# Patient Record
Sex: Female | Born: 1935
Health system: Southern US, Community
[De-identification: ages and names within clinical notes are randomized; demographics above are authoritative.]

## PROBLEM LIST (undated history)

## (undated) DIAGNOSIS — J309 Allergic rhinitis, unspecified: Secondary | ICD-10-CM

## (undated) DIAGNOSIS — R896 Abnormal cytological findings in specimens from other organs, systems and tissues: Secondary | ICD-10-CM

## (undated) DIAGNOSIS — E7800 Pure hypercholesterolemia, unspecified: Secondary | ICD-10-CM

## (undated) DIAGNOSIS — F329 Major depressive disorder, single episode, unspecified: Secondary | ICD-10-CM

## (undated) DIAGNOSIS — IMO0001 Reserved for inherently not codable concepts without codable children: Secondary | ICD-10-CM

## (undated) DIAGNOSIS — F32A Depression, unspecified: Secondary | ICD-10-CM

## (undated) DIAGNOSIS — E049 Nontoxic goiter, unspecified: Secondary | ICD-10-CM

## (undated) DIAGNOSIS — M8580 Other specified disorders of bone density and structure, unspecified site: Secondary | ICD-10-CM

## (undated) DIAGNOSIS — Z87442 Personal history of urinary calculi: Secondary | ICD-10-CM

## (undated) DIAGNOSIS — IMO0002 Reserved for concepts with insufficient information to code with codable children: Secondary | ICD-10-CM

## (undated) DIAGNOSIS — M47819 Spondylosis without myelopathy or radiculopathy, site unspecified: Secondary | ICD-10-CM

## (undated) DIAGNOSIS — I10 Essential (primary) hypertension: Secondary | ICD-10-CM

## (undated) DIAGNOSIS — F5104 Psychophysiologic insomnia: Secondary | ICD-10-CM

## (undated) DIAGNOSIS — K219 Gastro-esophageal reflux disease without esophagitis: Secondary | ICD-10-CM

## (undated) DIAGNOSIS — N83209 Unspecified ovarian cyst, unspecified side: Secondary | ICD-10-CM

## (undated) DIAGNOSIS — K859 Acute pancreatitis without necrosis or infection, unspecified: Secondary | ICD-10-CM

## (undated) DIAGNOSIS — M419 Scoliosis, unspecified: Secondary | ICD-10-CM

## (undated) DIAGNOSIS — N952 Postmenopausal atrophic vaginitis: Secondary | ICD-10-CM

## (undated) HISTORY — DX: Essential (primary) hypertension: I10

## (undated) HISTORY — DX: Postmenopausal atrophic vaginitis: N95.2

## (undated) HISTORY — DX: Unspecified ovarian cyst, unspecified side: N83.209

## (undated) HISTORY — PX: TONSILLECTOMY: SUR1361

## (undated) HISTORY — DX: Gastro-esophageal reflux disease without esophagitis: K21.9

## (undated) HISTORY — PX: WISDOM TOOTH EXTRACTION: SHX21

## (undated) HISTORY — DX: Reserved for concepts with insufficient information to code with codable children: IMO0002

## (undated) HISTORY — DX: Reserved for inherently not codable concepts without codable children: IMO0001

## (undated) HISTORY — DX: Abnormal cytological findings in specimens from other organs, systems and tissues: R89.6

## (undated) HISTORY — DX: Psychophysiologic insomnia: F51.04

## (undated) HISTORY — DX: Other specified disorders of bone density and structure, unspecified site: M85.80

## (undated) HISTORY — DX: Spondylosis without myelopathy or radiculopathy, site unspecified: M47.819

## (undated) HISTORY — DX: Scoliosis, unspecified: M41.9

---

## 1975-07-04 HISTORY — PX: ABDOMINAL HYSTERECTOMY: SHX81

## 1989-03-03 HISTORY — PX: KIDNEY STONE SURGERY: SHX686

## 1998-01-13 ENCOUNTER — Other Ambulatory Visit: Admission: RE | Admit: 1998-01-13 | Discharge: 1998-01-13 | Payer: Self-pay | Admitting: Obstetrics and Gynecology

## 1998-05-24 ENCOUNTER — Ambulatory Visit (HOSPITAL_COMMUNITY): Admission: RE | Admit: 1998-05-24 | Discharge: 1998-05-24 | Payer: Self-pay | Admitting: *Deleted

## 1999-01-25 ENCOUNTER — Other Ambulatory Visit: Admission: RE | Admit: 1999-01-25 | Discharge: 1999-01-25 | Payer: Self-pay | Admitting: Obstetrics and Gynecology

## 1999-05-05 ENCOUNTER — Emergency Department (HOSPITAL_COMMUNITY): Admission: EM | Admit: 1999-05-05 | Discharge: 1999-05-05 | Payer: Self-pay | Admitting: Emergency Medicine

## 1999-05-05 ENCOUNTER — Encounter: Payer: Self-pay | Admitting: Pulmonary Disease

## 2000-01-05 ENCOUNTER — Ambulatory Visit (HOSPITAL_COMMUNITY): Admission: RE | Admit: 2000-01-05 | Discharge: 2000-01-05 | Payer: Self-pay | Admitting: *Deleted

## 2000-01-05 ENCOUNTER — Encounter (INDEPENDENT_AMBULATORY_CARE_PROVIDER_SITE_OTHER): Payer: Self-pay | Admitting: Specialist

## 2000-10-05 ENCOUNTER — Other Ambulatory Visit: Admission: RE | Admit: 2000-10-05 | Discharge: 2000-10-05 | Payer: Self-pay | Admitting: Obstetrics and Gynecology

## 2000-10-05 ENCOUNTER — Encounter (INDEPENDENT_AMBULATORY_CARE_PROVIDER_SITE_OTHER): Payer: Self-pay

## 2001-01-07 ENCOUNTER — Other Ambulatory Visit: Admission: RE | Admit: 2001-01-07 | Discharge: 2001-01-07 | Payer: Self-pay | Admitting: Obstetrics and Gynecology

## 2001-01-08 ENCOUNTER — Ambulatory Visit (HOSPITAL_COMMUNITY): Admission: RE | Admit: 2001-01-08 | Discharge: 2001-01-08 | Payer: Self-pay | Admitting: Pulmonary Disease

## 2001-04-03 ENCOUNTER — Other Ambulatory Visit: Admission: RE | Admit: 2001-04-03 | Discharge: 2001-04-03 | Payer: Self-pay | Admitting: Obstetrics and Gynecology

## 2001-08-06 ENCOUNTER — Other Ambulatory Visit: Admission: RE | Admit: 2001-08-06 | Discharge: 2001-08-06 | Payer: Self-pay | Admitting: Obstetrics and Gynecology

## 2001-11-05 ENCOUNTER — Other Ambulatory Visit: Admission: RE | Admit: 2001-11-05 | Discharge: 2001-11-05 | Payer: Self-pay | Admitting: Obstetrics and Gynecology

## 2001-11-19 ENCOUNTER — Other Ambulatory Visit: Admission: RE | Admit: 2001-11-19 | Discharge: 2001-11-19 | Payer: Self-pay | Admitting: Obstetrics and Gynecology

## 2002-01-07 ENCOUNTER — Ambulatory Visit (HOSPITAL_COMMUNITY): Admission: RE | Admit: 2002-01-07 | Discharge: 2002-01-07 | Payer: Self-pay | Admitting: Pulmonary Disease

## 2002-01-07 ENCOUNTER — Encounter: Payer: Self-pay | Admitting: Pulmonary Disease

## 2002-03-18 ENCOUNTER — Encounter (INDEPENDENT_AMBULATORY_CARE_PROVIDER_SITE_OTHER): Payer: Self-pay | Admitting: *Deleted

## 2002-03-18 ENCOUNTER — Ambulatory Visit (HOSPITAL_COMMUNITY): Admission: RE | Admit: 2002-03-18 | Discharge: 2002-03-18 | Payer: Self-pay | Admitting: *Deleted

## 2002-07-07 ENCOUNTER — Other Ambulatory Visit: Admission: RE | Admit: 2002-07-07 | Discharge: 2002-07-07 | Payer: Self-pay | Admitting: Obstetrics and Gynecology

## 2003-01-14 ENCOUNTER — Other Ambulatory Visit: Admission: RE | Admit: 2003-01-14 | Discharge: 2003-01-14 | Payer: Self-pay | Admitting: Obstetrics and Gynecology

## 2003-04-28 ENCOUNTER — Encounter (INDEPENDENT_AMBULATORY_CARE_PROVIDER_SITE_OTHER): Payer: Self-pay | Admitting: *Deleted

## 2003-04-28 ENCOUNTER — Ambulatory Visit (HOSPITAL_COMMUNITY): Admission: RE | Admit: 2003-04-28 | Discharge: 2003-04-28 | Payer: Self-pay | Admitting: *Deleted

## 2003-05-18 ENCOUNTER — Ambulatory Visit (HOSPITAL_BASED_OUTPATIENT_CLINIC_OR_DEPARTMENT_OTHER): Admission: RE | Admit: 2003-05-18 | Discharge: 2003-05-18 | Payer: Self-pay | Admitting: Pulmonary Disease

## 2004-01-19 ENCOUNTER — Other Ambulatory Visit: Admission: RE | Admit: 2004-01-19 | Discharge: 2004-01-19 | Payer: Self-pay | Admitting: Obstetrics and Gynecology

## 2005-01-23 ENCOUNTER — Other Ambulatory Visit: Admission: RE | Admit: 2005-01-23 | Discharge: 2005-01-23 | Payer: Self-pay | Admitting: Obstetrics and Gynecology

## 2006-01-24 ENCOUNTER — Other Ambulatory Visit: Admission: RE | Admit: 2006-01-24 | Discharge: 2006-01-24 | Payer: Self-pay | Admitting: Obstetrics and Gynecology

## 2006-01-28 ENCOUNTER — Observation Stay (HOSPITAL_COMMUNITY): Admission: EM | Admit: 2006-01-28 | Discharge: 2006-01-29 | Payer: Self-pay | Admitting: Emergency Medicine

## 2006-01-28 ENCOUNTER — Ambulatory Visit: Payer: Self-pay | Admitting: Cardiology

## 2006-01-31 ENCOUNTER — Ambulatory Visit: Payer: Self-pay | Admitting: Cardiology

## 2006-01-31 ENCOUNTER — Ambulatory Visit: Payer: Self-pay

## 2006-02-13 ENCOUNTER — Ambulatory Visit: Payer: Self-pay | Admitting: Cardiology

## 2006-02-19 ENCOUNTER — Encounter: Admission: RE | Admit: 2006-02-19 | Discharge: 2006-02-19 | Payer: Self-pay | Admitting: *Deleted

## 2006-03-06 ENCOUNTER — Encounter (INDEPENDENT_AMBULATORY_CARE_PROVIDER_SITE_OTHER): Payer: Self-pay | Admitting: Specialist

## 2006-03-06 ENCOUNTER — Ambulatory Visit (HOSPITAL_COMMUNITY): Admission: RE | Admit: 2006-03-06 | Discharge: 2006-03-06 | Payer: Self-pay | Admitting: *Deleted

## 2008-02-10 ENCOUNTER — Encounter: Admission: RE | Admit: 2008-02-10 | Discharge: 2008-02-10 | Payer: Self-pay | Admitting: Orthopaedic Surgery

## 2008-03-10 ENCOUNTER — Encounter: Admission: RE | Admit: 2008-03-10 | Discharge: 2008-03-10 | Payer: Self-pay | Admitting: Orthopaedic Surgery

## 2008-04-08 ENCOUNTER — Encounter: Admission: RE | Admit: 2008-04-08 | Discharge: 2008-04-08 | Payer: Self-pay | Admitting: Orthopaedic Surgery

## 2009-02-24 ENCOUNTER — Encounter: Admission: RE | Admit: 2009-02-24 | Discharge: 2009-05-25 | Payer: Self-pay | Admitting: Obstetrics and Gynecology

## 2010-11-18 NOTE — Op Note (Signed)
Nancy Suarez, Nancy Suarez                ACCOUNT NO.:  [id number]   MEDICAL RECORD NO.:  [id number]          PATIENT TYPE:  AMB   LOCATION:  ENDO                         FACILITY:  [facility]   PHYSICIAN:  Georgiana Spinner, M.D.    DATE OF BIRTH:  1932-05-23   DATE OF PROCEDURE:  DATE OF DISCHARGE:                                 OPERATIVE REPORT   PROCEDURE PERFORMED:  Upper endoscopy.   INDICATIONS:  GERD, with abdominal pain.   ANESTHESIA:  Fentanyl 50 mcg, Versed 5 mg.   PROCEDURE:  With the patient mildly sedated in the left lateral decubitus  position, the Olympus videoscopic endoscope was inserted in the mouth and  passed under direct vision through the esophagus which appeared normal until  we reached the distal esophagus and there was a question of some Barrett's  esophagus photographed and we biopsied the area.  We entered into the  stomach.  The fundus, body, antrum, duodenal bulb, and second portion of the  duodenum were visualized.  From this point, the endoscope was slowly  withdrawn, taking circumferential views of the duodenal mucosa until the  endoscope had been pulled back into the stomach and placed in retroflexion  to view the stomach from below.  The endoscope was then straightened and  withdrawn, taking circumferential views of the remaining gastric and  esophageal mucosa.  The patient's vital signs and pulse oximeter remained  stable.  The patient tolerated the procedure well without apparent  complications.   FINDINGS:  Question of Barrett's esophagus, biopsied.  Await biopsy report.  The patient will call me for results and follow-up with me as an outpatient.           ______________________________  Georgiana Spinner, M.D.     GMO/MEDQ  D:  03/06/2006  T:  03/06/2006  Job:  [id number]   cc:   Mina Marble, M.D.

## 2010-11-18 NOTE — Procedures (Signed)
[facility]  Patient:    Nancy Suarez, Nancy Suarez                       MRN: [id number] Proc. Date: 01/05/00 Adm. Date:  [id number] Attending:  Sabino Gasser CC:         Eino Farber., M.D.                           Procedure Report  PROCEDURE PERFORMED:  Colonoscopy.  ENDOSCOPIST:  Sabino Gasser, M.D.  INDICATIONS FOR PROCEDURE:  Colon cancer screening, abdominal pain.  ANESTHESIA:  Demerol 30 mg, Versed 2 mg given intravenously in divided dose.  DESCRIPTION OF PROCEDURE:  With the patient mildly sedated in the left lateral decubitus position, the Olympus videoscopic colonoscope was inserted in the rectum and passed under direct vision into the cecum.  The cecum was identified by the ileocecal valve and appendiceal orifice, both of which were photographed.  From this point, the colonoscope was slowly withdrawn, taking circumferential views of the entire colonic mucosa stopping only in the sigmoid colon area where small polyps were seen, photographed and removed using hot biopsy forceps technique setting of 20/20 blended current.  The endoscope was then further withdrawn to the rectum, which appeared normal on direct view and showed large internal hemorrhoids on retroflex view.  The endoscope was straightened and withdrawn.  Patients vital signs and pulse oximeter remained stable.  The patient tolerated the procedure well and without apparent complications.  FINDINGS:  Large internal hemorrhoids.  Small polyps of rectosigmoid area, await biopsy report.  Patient will call me for results and follow up with me as an outpatient. DD:  01/05/00 TD:  01/05/00 Job: [id number]

## 2010-11-18 NOTE — Assessment & Plan Note (Signed)
[facility]                              CARDIOLOGY OFFICE NOTE   NAME:Nancy Suarez, Nancy Suarez                       MRN:          [id number]  DATE:02/13/2006                            DOB:          1931/08/30    Nancy Suarez is a very pleasant 77 year old female that recently sought [facility] secondary to atypical chest pain.  We felt that it was most  likely gastrointestinal in etiology.  Note, her D-Dimer was normal and her  troponins were normal as well.  She was scheduled for an outpatient Myoview  which was performed on January 31, 2006.  This showed an ejection fraction of  75% and there was inferobasal thinning but no ischemia.  She also had an  event monitor for palpitations that showed sinus tachycardia.  Since that  time she does continue to have occasional chest pain.  It is typically in  the upper chest area and occurs after eating and can be associated with  water brash.  She does not have exertional chest pain. She also feels her  elevated heart rate predominately when she feels anxious.  She has not had  syncope.  There is no significant dyspnea.   MEDICATIONS:  1. Zetia 10 mg p.o. daily.  2. Welchol 625 mg p.o. daily.  3. Sular 20 mg p.o. daily.  4. Avalide 150/12.5 mg tablets 1 p.o. daily.  5. Aspirin 81 mg p.o. daily.  6. Remeron 15 mg p.o. daily.  7. Ambien.  8. Multivitamin.  9. Caltrate.  10.Nexium.   PHYSICAL EXAMINATION:  VITAL SIGNS:  Blood pressure 148/88, pulse 72.  NECK:  Supple.  CHEST:  Clear.  CARDIOVASCULAR:  Regular rate and rhythm.  EXTREMITIES:  No edema.   DIAGNOSES:  1. Recent atypical chest pain most likely reflux related.  2. Palpitations associated with sinus rhythm.  3. Hypertension.  4. Hyperlipidemia.  5. History of pancreatitis.  6. Gastroesophageal reflux disease.   PLAN:  Nancy Suarez is doing well from a symptomatic standpoint.  Her chest pain  seems most consistent with reflux and she is  scheduled to see Nancy Suarez  tomorrow for evaluation and for endoscopy in September.  Her nuclear study  showed no ischemia.  I therefore do not think she needs further cardiac  workup.  Note also that her palpitations were associated with sinus  rhythm.  She will therefore continue with risk factor modification and I  have asked her to follow up with Nancy Suarez concerning her blood  pressure and cholesterol.                              Madolyn Frieze Jens Som, MD, [facility]    BSC/MedQ  DD:  02/13/2006  DT:  02/14/2006  Job #:  [id number]   cc:   Nancy Marble, MD

## 2010-11-18 NOTE — Consult Note (Signed)
NAMEMARQUESA, RATH                ACCOUNT NO.:  [id number]   MEDICAL RECORD NO.:  [id number]          PATIENT TYPE:  INP   LOCATION:  2031                         FACILITY:  [facility]   PHYSICIAN:  Olga Millers, M.D. [facility] OF BIRTH:  June 17, 1932   DATE OF CONSULTATION:  01/29/2006  DATE OF DISCHARGE:                                   CONSULTATION   HISTORY OF PRESENT ILLNESS:  Ms. Kerney is a very pleasant 77 year old female  with a past medical history of hypertension, hyperlipidemia and  pancreatitis, nephrolithiasis and gastroesophageal reflux disease who we  were asked to evaluate for chest pain.  She has no prior cardiac history and  typically does not have exertional chest pain, although she can have some  dyspnea on exertion.  There is no orthopnea, PND or pedal edema.  The  patient recently flew to [place].  Since returning, she has not felt very  well.  She has episodes of palpitations that last for minutes.  They are  sudden in onset.  There is associated mild dyspnea, but there is no  presyncope.  There is mild chest tightness.  This past Friday she had an  episode of chest tightness that was more severe than normal.  It was similar  to her previous reflux pain.  The pain does not radiate.  There is  associated shortness of breath, mild nausea and diaphoresis.  The pain was  not pleuritic or positional.  It resolved ultimately after she fell asleep.  She had a second episode yesterday of similar severity, but again, similar  to her previous reflux pain.  She also complained of pain in her right lower  abdomen.  There was no associated dysuria, hematuria, melena, hematochezia  or acholic stools.  She was admitted by Dr. Petra Kuba, and we were asked to  further evaluate.   CURRENT MEDICATIONS:  1.  Sular 20 mg p.o. daily.  2.  Lipitor 20 mg daily.  3.  Percocet 5/325 mg 1 p.o. daily.  4.  Remeron.  5.  Ambien.  6.  Calcium.  7.  Multivitamin.   Dr. Amada Jupiter also  added Avapro 150 mg p.o. daily and hydrochlorothiazide  12.5 p.o. daily today for elevated blood pressure.  She is also receiving  Protonix 40 mg p.o. daily.   ALLERGIES:  SHE HAS AN INTOLERANCE TO ASPIRIN.  IT APPARENTLY UPSETS HER  STOMACH.   FAMILY HISTORY:  Negative for coronary artery disease.   SOCIAL HISTORY:  She has a remote history of tobacco use, but has not smoked  since 1980.  She rarely consumes alcohol.   PAST MEDICAL HISTORY:  There is no diabetes mellitus, but there is  hypertension and hyperlipidemia.  She has had what sounds to be  hyperthyroidism treated with radioactive iodine in the past.  She has a long  history of gastroesophageal reflux disease.  She has had nephrolithiasis as  well.  She has a history of pancreatitis.  There is also chronic back pain  and scoliosis.  She has had a prior hysterectomy as well as tonsillectomy.   REVIEW  OF SYSTEMS:  She denies any headaches or fevers or chills.  There is  no productive cough or hemoptysis.  There is no dysphasia or odynophagia,  melena or hematochezia.  There is no dysuria or hematuria.  There is no rash  or seizure activity.  There is chronic dyspnea on exertion, but there is no  orthopnea, PND or pedal edema.  The remainder of systems are negative.   PHYSICAL EXAM:  VITAL SIGNS:  A blood pressure of 124/68 and her pulse is  74.  She is afebrile.  GENERAL:  She is well-developed and well-nourished, in no acute distress.  SKIN:  Warm and dry.  She does not appear to be depressed, and there is no  peripheral clubbing.  HEENT:  Unremarkable with no myeloids.  NECK:  Supple with no bilateral adenopathy and I can not appreciate bruits.  There is no jugular venous distention and no thyromegaly is noted.  CHEST:  Clear to auscultation.  Normal expansions.  CARDIOVASCULAR EXAM:  Reveals a regular rate and rhythm.  Normal S1 and S2.  There are no murmurs, rubs or gallops noted.  ABDOMINAL EXAM:  Shows no tenderness  to palpation and no rebound.  There are  positive bowel sounds, and I can  not palpate masses or hepatomegaly.  There  is no abdominal bruit.  She has 2+ femoral pulses bilaterally.  No bruits.  EXTREMITIES:  Show no edema, and I could palpate no cords.  She has 2+  posterior tibial pulses bilaterally.  NEUROLOGIC EXAM:  Grossly intact.   Her electrocardiogram shows a sinus rhythm at a rate of 71.  There is left  ventricular hypertrophy.  There were no ST changes noted.  Her chest x-ray  shows no acute disease other than a mildly elevated right hemidiaphragm.  Her initial enzymes are negative.  Her D-dimer is normal.   DIAGNOSES:  1.  Atypical chest pain.  2.  History of gastroesophageal reflux disease.  3.  Hypertension.  4.  Hyperlipidemia.  5.  History of pancreatitis.  6.  Gastroesophageal reflux disease.   PLAN:  Ms. Iglesia is complaining of chest pain that may be GI in etiology.  I  agree with continuing her Protonix.  She also has multiple risk factors, and  we will await her follow up enzymes.  If they are negative, then I think it  is okay to discharge home from a cardiac standpoint.  We will arrange an  outpatient Myoview to risk stratify and to exclude ischemia.  We will also  schedule her to have an event monitor for her palpitations, and she will see  me back in 2-4 weeks.  I would continue with her aspirin (enteric-coated) at  81 mg p.o. daily.  We will also continue with her present blood pressure  medications, and they can be adjusted as an outpatient.  Finally, I would  continue with her statin, and she will need follow up liver functions and  lipid panel in 6 weeks.           ______________________________  Olga Millers, M.D. [facility]     BC/MEDQ  D:  01/29/2006  T:  01/29/2006  Job:  [id number]

## 2010-11-18 NOTE — Discharge Summary (Signed)
Nancy Suarez, Nancy Suarez                ACCOUNT NO.:  [id number]   MEDICAL RECORD NO.:  [id number]          PATIENT TYPE:  AMB   LOCATION:  ENDO                         FACILITY:  [facility]   PHYSICIAN:  Mina Marble, M.D.DATE OF BIRTH:  1932/04/04   DATE OF ADMISSION:  01/28/2006  DATE OF DISCHARGE:  01/29/2006                               DISCHARGE SUMMARY   DISCHARGE DIAGNOSES:  1. Chest pain, etiology undetermined.  Question of coronary artery      disease.  2. Gastroesophageal reflux disease.  3. Hypertension and heart disease, improved on medication.  4. Hypercholesterolism, poor control.  5. Degenerative joint disease, severe, of the spine, and moderate of      the extremities.   CONSULTATIONS:  [place], Olga Millers, MD.   BRIEF HISTORY:  The patient is a 77 year old African American female  with a known history of gastroesophageal reflux disease who presented to  the emergency room complaining of severe epigastric and lower chest pain  that was off and on with belching.  The chest pain did not clear with  her Prilosec.  The patient also takes medications, Sular 20 mg tablets,  Norvasc, and amlodipine 5 mg daily, and Avapro 150/12.5 mg tablets, HCTZ  for blood pressure control.  She also takes baby aspirin for  circulation.  The patient had not been taking her cholesterol  medication, Lipitor 20 mg tablets 1 daily and WelChol 625 mg tablet 1  b.i.d. with meals on a regular basis.   VITAL SIGNS ON ADMISSION:  On January 28, 2006, revealed a temperature of  97.2, pulse 76, respirations 20, blood pressure of 186/86 to 174/86.   HEENT:  Examination of the head, eyes, ears, nose and throat revealed  the patient to be alert and coherent.  LUNGS:  The lungs were clear to auscultation.  HEART:  Revealed a regular rhythm with no rales.  ABDOMEN:  Soft, with mild epigastric tenderness.  EXTREMITIES:  Revealed moderate changes of degenerative joint disease of  the  knees.   HOSPITAL COURSE:  Her hospital course was one of rapid improvement, and  frequent evaluation by the cardiology service, Olga Millers, MD, of  [facility] cardiology reviewed the patient on January 29, 2006, to have a  followup evaluation for cardiac Myoview stress test and other studies as  an outpatient.   DISCHARGE MEDICATIONS:  Consisted of:  1. Sular 20 mg.  2. Norvasc 5 mg one dose on her plan daily.  3. Percocet 5/325 tablet 1 daily p.r.n. for severe pain.  4. Ambien 10 mg p.o. q.h.s. for sleep.  5. Prilosec 20 mg p.o. daily for acid reflux.  6. Multivitamin 1 daily.  7. Remeron 15 mg p.o. daily at 8 p.m.  8. Potassium tablet 1 p.o. daily b.i.d.  9. Lipitor 20 mg tablets 1 daily.  10.WelChol 625 mg 1 b.i.d. with meals.  11.Avalide 150/12.5 mg tablet 1 daily was added to her blood pressure      medication for better blood pressure control.  She is to have a      followup appointment in my office,  [facility] Shelter, Montez Hageman., MD, in      October of 2009 and to call Olga Millers, MD, cardiologist,      [facility] cardiology, for appointment in August of 2007.   She was to continue her no-added salt, low-fat, low-cholesterol diet.      Mina Marble, M.D.  Electronically Signed     GRK/MEDQ  D:  08/09/2006  T:  08/10/2006  Job:  [id number]

## 2012-02-26 ENCOUNTER — Encounter: Payer: Self-pay | Admitting: Obstetrics and Gynecology

## 2012-02-26 ENCOUNTER — Ambulatory Visit (INDEPENDENT_AMBULATORY_CARE_PROVIDER_SITE_OTHER): Payer: Medicare Other | Admitting: Obstetrics and Gynecology

## 2012-02-26 VITALS — BP 152/64 | Ht 64.0 in | Wt 164.0 lb

## 2012-02-26 DIAGNOSIS — IMO0002 Reserved for concepts with insufficient information to code with codable children: Secondary | ICD-10-CM | POA: Insufficient documentation

## 2012-02-26 DIAGNOSIS — N83209 Unspecified ovarian cyst, unspecified side: Secondary | ICD-10-CM | POA: Insufficient documentation

## 2012-02-26 DIAGNOSIS — M47819 Spondylosis without myelopathy or radiculopathy, site unspecified: Secondary | ICD-10-CM | POA: Insufficient documentation

## 2012-02-26 DIAGNOSIS — M899 Disorder of bone, unspecified: Secondary | ICD-10-CM

## 2012-02-26 DIAGNOSIS — N952 Postmenopausal atrophic vaginitis: Secondary | ICD-10-CM | POA: Insufficient documentation

## 2012-02-26 DIAGNOSIS — K219 Gastro-esophageal reflux disease without esophagitis: Secondary | ICD-10-CM | POA: Insufficient documentation

## 2012-02-26 DIAGNOSIS — M419 Scoliosis, unspecified: Secondary | ICD-10-CM | POA: Insufficient documentation

## 2012-02-26 DIAGNOSIS — M8580 Other specified disorders of bone density and structure, unspecified site: Secondary | ICD-10-CM

## 2012-02-26 DIAGNOSIS — IMO0001 Reserved for inherently not codable concepts without codable children: Secondary | ICD-10-CM | POA: Insufficient documentation

## 2012-02-26 DIAGNOSIS — F5104 Psychophysiologic insomnia: Secondary | ICD-10-CM | POA: Insufficient documentation

## 2012-02-26 NOTE — Progress Notes (Signed)
Subjective:  AEX:  Last Pap: 01/14/2008 WNL: Yes Regular Periods:no Contraception: Hysterectomy  Monthly Breast exam:yes Tetanus<40yrs:yes Nl.Bladder Function:yes Daily BMs:yes Healthy Diet:yes Calcium:yes Mammogram:yes Date of Mammogram: 03/15/2011 Exercise:yes Have often Exercise: 3 times weekly Seatbelt: yes Abuse at home: no Stressful work:no Sigmoid-colonoscopy: 04/2009 Bone Density: Yes 03/2011 PCP: Dr. Luisa Hart Change in PMH: None Change in [facility]: None    Nancy Suarez is a 78 y.o. female G1P1 who presents for annual exam.  The patient has no complaints today. She was seen in urgent care center for her spine curvature with pain and plans to followup with the orthopedist.  The following portions of the patient's history were reviewed and updated as appropriate: allergies, current medications, past family history, past medical history, past social history, past surgical history and problem list.  Review of Systems Pertinent items are noted in HPI. Gastrointestinal:No change in bowel habits, no abdominal pain, no rectal bleeding Genitourinary:negative for dysuria, frequency, hematuria, nocturia and urinary incontinence    Objective:     BP 152/64  Ht 5\' 4"  (1.626 m)  Wt 164 lb (74.39 kg)  BMI 28.15 kg/m2  Weight:  Wt Readings from Last 1 Encounters:  02/26/12 164 lb (74.39 kg)     BMI: Body mass index is 28.15 kg/(m^2). General Appearance: Alert, appropriate appearance for age. No acute distress HEENT: Grossly normal Neck / Thyroid: Supple, no masses, nodes or enlargement Lungs: clear to auscultation bilaterally Back: No CVA tenderness.  sigificant lordosis of the spine with fat pad Breast Exam: No dimpling, nipple retraction or discharge. No masses or nodes. and pendulous. Cardiovascular: Regular rate and rhythm. S1, S2, no murmur Gastrointestinal: Soft, non-tender, no masses or organomegaly Pelvic Exam: External genitalia: normal general appearance Vaginal:  atrophic mucosa and vaginal vault, suspended Cervix: removed surgically Adnexa: non palpable Uterus: removed surgically Rectovaginal: normal rectal, no masses Lymphatic Exam: Non-palpable nodes in neck, clavicular, axillary, or inguinal regions Skin: no rash or abnormalities Neurologic: Normal gait and speech, no tremor  Psychiatric: Alert and oriented, appropriate affect.    Urinalysis:Not done    Assessment:    Menopause No symptoms    Plan:   mammogram return annually or prn Follow-up:  for annual exam

## 2012-04-01 ENCOUNTER — Encounter: Payer: Self-pay | Admitting: Obstetrics and Gynecology

## 2012-04-02 ENCOUNTER — Telehealth: Payer: Self-pay | Admitting: Obstetrics and Gynecology

## 2012-04-03 NOTE — Telephone Encounter (Signed)
TC to pt. States has questions about dense breast letter she received. Scheduled for class with Dr AVS 04/04/12 .

## 2012-04-04 ENCOUNTER — Encounter: Payer: Self-pay | Admitting: Obstetrics and Gynecology

## 2012-04-04 ENCOUNTER — Ambulatory Visit: Payer: Medicare Other | Admitting: Obstetrics and Gynecology

## 2012-04-04 VITALS — BP 142/62 | Ht 63.0 in | Wt 164.0 lb

## 2012-04-04 DIAGNOSIS — R922 Inconclusive mammogram: Secondary | ICD-10-CM

## 2012-04-04 NOTE — Progress Notes (Signed)
HISTORY OF PRESENT ILLNESS  Nancy Suarez is a 78 y.o. year old female,G1P1, who presents for a problem visit. The patient recently had a mammogram that showed dense breast.  She presents for further discussion and evaluation.  Past breast history:  Personal history of breast cancer:          no Family history of breast cancer:              no Breast discharge:                                    no Breast bleeding:                                       no Menarche prior to 78 years of age:          no First child after age 75 years:                  yes Cigarette smoker:                                    no Estrogen replacement therapy:               no Excessive alcohol consumption:             No   Objective:  BP 142/62  Ht 5\' 3"  (1.6 m)  Wt 164 lb (74.39 kg)  BMI 29.05 kg/m2   Breast exam:  normal appearance, no masses or tenderness  Gail model risk: 1.7% risk of breast cancer in the next 5 years (average) and 3.0% risk of breast cancer before age 84 (average).   Assessment:  Dense breast on mammogram.  Plan:  The patient and I discussed the implications of having dense breast on mammogram.  Only five states in the [place] require that patients be notified of dense breast on mammogram.  [place] recently passed a large requiring notification. Only one state requires that insurance companies pay for follow-up evaluation.  The patient was told that dense breast are an independent risk factor for breast cancer.  She was told that having dense breast does not mean that the patient has breast cancer, nor will develop breast cancer.  It is known that dense breast make it more difficult to read and interpret mammograms.  There are other modalities for evaluating breast.  They include breast ultrasound, tomosynthesis, and MRI.  The patient understands that these tests are expensive, and that her insurance company may or may not pay to have these done. Ultrasound,  tomosynthesis, and MRI show more findings that are not cancer, which can result in additional testing and unnecessary biopsies. The patient was offered the previously mentioned tests, but declines further testing at this time. The patient was told that the [employer] of [employer]nec[employer] recommends annual mammograms starting at age 76.  Breast awareness was reviewed with the patient.  Her questions were answered.  The patient will return for her annual exam.  The patient was given written information about dense breast and proper breast care.   Leonard Schwartz M.D.  04/04/2012 5:23 PM

## 2012-05-07 ENCOUNTER — Other Ambulatory Visit: Payer: Self-pay | Admitting: Obstetrics and Gynecology

## 2012-05-08 NOTE — Telephone Encounter (Addendum)
[facility] to address Pt with hx of irritative vulvitis wants refill on mycolog topical.  Ok to refill.

## 2014-01-05 ENCOUNTER — Encounter (HOSPITAL_COMMUNITY): Payer: Self-pay | Admitting: Pharmacy Technician

## 2014-01-12 ENCOUNTER — Encounter (HOSPITAL_COMMUNITY): Payer: Self-pay

## 2014-01-12 ENCOUNTER — Encounter (HOSPITAL_COMMUNITY)
Admission: RE | Admit: 2014-01-12 | Discharge: 2014-01-12 | Disposition: A | Discharge status: Home / Self Care | Payer: Medicare Other | Source: Ambulatory Visit | Attending: Orthopedic Surgery | Admitting: Orthopedic Surgery

## 2014-01-12 ENCOUNTER — Ambulatory Visit (HOSPITAL_COMMUNITY)
Admission: RE | Admit: 2014-01-12 | Discharge: 2014-01-12 | Disposition: A | Discharge status: Home / Self Care | Payer: Medicare Other | Source: Ambulatory Visit | Attending: Anesthesiology | Admitting: Anesthesiology

## 2014-01-12 ENCOUNTER — Encounter (INDEPENDENT_AMBULATORY_CARE_PROVIDER_SITE_OTHER): Payer: Self-pay

## 2014-01-12 DIAGNOSIS — Z01810 Encounter for preprocedural cardiovascular examination: Secondary | ICD-10-CM | POA: Insufficient documentation

## 2014-01-12 DIAGNOSIS — I10 Essential (primary) hypertension: Secondary | ICD-10-CM | POA: Insufficient documentation

## 2014-01-12 DIAGNOSIS — Z01818 Encounter for other preprocedural examination: Secondary | ICD-10-CM | POA: Insufficient documentation

## 2014-01-12 DIAGNOSIS — Z01812 Encounter for preprocedural laboratory examination: Secondary | ICD-10-CM | POA: Insufficient documentation

## 2014-01-12 HISTORY — DX: Major depressive disorder, single episode, unspecified: F32.9

## 2014-01-12 HISTORY — DX: Pure hypercholesterolemia, unspecified: E78.00

## 2014-01-12 HISTORY — DX: Depression, unspecified: F32.A

## 2014-01-12 HISTORY — DX: Nontoxic goiter, unspecified: E04.9

## 2014-01-12 HISTORY — DX: Personal history of urinary calculi: Z87.442

## 2014-01-12 HISTORY — DX: Acute pancreatitis without necrosis or infection, unspecified: K85.90

## 2014-01-12 LAB — BASIC METABOLIC PANEL
Anion gap: 10 (ref 5–15)
BUN: 27 mg/dL — AB (ref 6–23)
CHLORIDE: 105 meq/L (ref 96–112)
CO2: 26 meq/L (ref 19–32)
Calcium: 10.3 mg/dL (ref 8.4–10.5)
Creatinine, Ser: 1.37 mg/dL — ABNORMAL HIGH (ref 0.50–1.10)
GFR calc non Af Amer: 35 mL/min — ABNORMAL LOW (ref >90)
GFR, EST AFRICAN AMERICAN: 41 mL/min — AB (ref >90)
GLUCOSE: 110 mg/dL — AB (ref 70–99)
POTASSIUM: 4.3 meq/L (ref 3.7–5.3)
Sodium: 141 mEq/L (ref 137–147)

## 2014-01-12 LAB — CBC
HCT: 32.4 % — ABNORMAL LOW (ref 36.0–46.0)
HEMOGLOBIN: 10.8 g/dL — AB (ref 12.0–15.0)
MCH: 30 pg (ref 26.0–34.0)
MCHC: 33.3 g/dL (ref 30.0–36.0)
MCV: 90 fL (ref 78.0–100.0)
Platelets: 226 10*3/uL (ref 150–400)
RBC: 3.6 MIL/uL — AB (ref 3.87–5.11)
RDW: 12.5 % (ref 11.5–15.5)
WBC: 5.4 10*3/uL (ref 4.0–10.5)

## 2014-01-12 LAB — APTT: aPTT: 31 seconds (ref 24–37)

## 2014-01-12 LAB — PROTIME-INR
INR: 1.04 (ref 0.00–1.49)
PROTHROMBIN TIME: 13.6 s (ref 11.6–15.2)

## 2014-01-12 LAB — URINE MICROSCOPIC-ADD ON

## 2014-01-12 LAB — URINALYSIS, ROUTINE W REFLEX MICROSCOPIC
Bilirubin Urine: NEGATIVE
Glucose, UA: NEGATIVE mg/dL
HGB URINE DIPSTICK: NEGATIVE
Ketones, ur: NEGATIVE mg/dL
NITRITE: NEGATIVE
Protein, ur: NEGATIVE mg/dL
SPECIFIC GRAVITY, URINE: 1.024 (ref 1.005–1.030)
UROBILINOGEN UA: 0.2 mg/dL (ref 0.0–1.0)
pH: 5 (ref 5.0–8.0)

## 2014-01-12 LAB — SURGICAL PCR SCREEN
MRSA, PCR: NEGATIVE
STAPHYLOCOCCUS AUREUS: POSITIVE — AB

## 2014-01-12 LAB — ABO/RH: ABO/RH(D): O POS

## 2014-01-12 NOTE — Patient Instructions (Signed)
20 Nancy Suarez  01/12/2014   Your procedure is scheduled on: Monday 01/19/14  Report to [facility] at 10:00 AM.  Call this number if you have problems the morning of surgery [phone]: [phone]   Remember:   Do not eat food or drink liquids After Midnight.     Take these medicines the morning of surgery with A SIP OF WATER: lipitor, eye drops if needed, prilosec, oxycodone if needed   Do not wear jewelry, make-up or nail polish.  Do not wear lotions, powders, or perfumes. You may wear deodorant.  Do not shave 48 hours prior to surgery. Men may shave face and neck.  Do not bring valuables to the hospital.  Contacts, dentures or bridgework may not be worn into surgery.  Leave suitcase in the car. After surgery it may be brought to your room.  For patients admitted to the hospital, checkout time is 11:00 AM the day of discharge.   Please read over the following fact sheets that you were given: MRSA Information Nancy Sonsachel Tanayia Wahlquist, RN  pre op nurse call if needed [phone]    [facility] - Preparing for Surgery Before surgery, you can play an important role.  Because skin is not sterile, your skin needs to be as free of germs as possible.  You can reduce the number of germs on your skin by washing with CHG (chlorahexidine gluconate) soap before surgery.  CHG is an antiseptic cleaner which kills germs and bonds with the skin to continue killing germs even after washing. Please DO NOT use if you have an allergy to CHG or antibacterial soaps.  If your skin becomes reddened/irritated stop using the CHG and inform your nurse when you arrive at Short Stay. Do not shave (including legs and underarms) for at least 48 hours prior to the first CHG shower.  You may shave your face/neck. Please follow these instructions carefully:  1.  Shower with CHG Soap the night before surgery and the  morning of Surgery.  2.  If you choose to wash your hair, wash your hair first as usual with your   normal  shampoo.  3.  After you shampoo, rinse your hair and body thoroughly to remove the  shampoo.                            4.  Use CHG as you would any other liquid soap.  You can apply chg directly  to the skin and wash                       Gently with a scrungie or clean washcloth.  5.  Apply the CHG Soap to your body ONLY FROM THE NECK DOWN.   Do not use on face/ open                           Wound or open sores. Avoid contact with eyes, ears mouth and genitals (private parts).                       Wash face,  Genitals (private parts) with your normal soap.             6.  Wash thoroughly, paying special attention to the area where your surgery  will be performed.  7.  Thoroughly rinse your body with warm water  from the neck down.  8.  DO NOT shower/wash with your normal soap after using and rinsing off  the CHG Soap.                9.  Pat yourself dry with a clean towel.            10.  Wear clean pajamas.            11.  Place clean sheets on your bed the night of your first shower and do not  sleep with pets. Day of Surgery : Do not apply any lotions/deodorants the morning of surgery.  Please wear clean clothes to the hospital/surgery center.  FAILURE TO FOLLOW THESE INSTRUCTIONS MAY RESULT IN THE CANCELLATION OF YOUR SURGERY PATIENT SIGNATURE_________________________________  NURSE SIGNATURE__________________________________  ________________________________________________________________________  WHAT IS A BLOOD TRANSFUSION? Blood Transfusion Information  A transfusion is the replacement of blood or some of its parts. Blood is made up of multiple cells which provide different functions.  Red blood cells carry oxygen and are used for blood loss replacement.  White blood cells fight against infection.  Platelets control bleeding.  Plasma helps clot blood.  Other blood products are available for specialized needs, such as hemophilia or other clotting  disorders. BEFORE THE TRANSFUSION  Who gives blood for transfusions?   Healthy volunteers who are fully evaluated to make sure their blood is safe. This is blood bank blood. Transfusion therapy is the safest it has ever been in the practice of medicine. Before blood is taken from a donor, a complete history is taken to make sure that person has no history of diseases nor engages in risky social behavior (examples are intravenous drug use or sexual activity with multiple partners). The donor's travel history is screened to minimize risk of transmitting infections, such as malaria. The donated blood is tested for signs of infectious diseases, such as HIV and hepatitis. The blood is then tested to be sure it is compatible with you in order to minimize the chance of a transfusion reaction. If you or a relative donates blood, this is often done in anticipation of surgery and is not appropriate for emergency situations. It takes many days to process the donated blood. RISKS AND COMPLICATIONS Although transfusion therapy is very safe and saves many lives, the main dangers of transfusion include:   Getting an infectious disease.  Developing a transfusion reaction. This is an allergic reaction to something in the blood you were given. Every precaution is taken to prevent this. The decision to have a blood transfusion has been considered carefully by your caregiver before blood is given. Blood is not given unless the benefits outweigh the risks. AFTER THE TRANSFUSION  Right after receiving a blood transfusion, you will usually feel much better and more energetic. This is especially true if your red blood cells have gotten low (anemic). The transfusion raises the level of the red blood cells which carry oxygen, and this usually causes an energy increase.  The nurse administering the transfusion will monitor you carefully for complications. HOME CARE INSTRUCTIONS  No special instructions are needed after a  transfusion. You may find your energy is better. Speak with your caregiver about any limitations on activity for underlying diseases you may have. SEEK MEDICAL CARE IF:   Your condition is not improving after your transfusion.  You develop redness or irritation at the intravenous (IV) site. SEEK IMMEDIATE MEDICAL CARE IF:  Any of the following symptoms occur over the next  12 hours:  Shaking chills.  You have a temperature by mouth above 102 F (38.9 C), not controlled by medicine.  Chest, back, or muscle pain.  People around you feel you are not acting correctly or are confused.  Shortness of breath or difficulty breathing.  Dizziness and fainting.  You get a rash or develop hives.  You have a decrease in urine output.  Your urine turns a dark color or changes to pink, red, or brown. Any of the following symptoms occur over the next 10 days:  You have a temperature by mouth above 102 F (38.9 C), not controlled by medicine.  Shortness of breath.  Weakness after normal activity.  The white part of the eye turns yellow (jaundice).  You have a decrease in the amount of urine or are urinating less often.  Your urine turns a dark color or changes to pink, red, or brown. Document Released: 06/16/2000 Document Revised: 09/11/2011 Document Reviewed: 02/03/2008 ExitCare Patient Information 2014 [place].  _______________________________________________________________________  Incentive Spirometer  An incentive spirometer is a tool that can help keep your lungs clear and active. This tool measures how well you are filling your lungs with each breath. Taking long deep breaths may help reverse or decrease the chance of developing breathing (pulmonary) problems (especially infection) following:  A long period of time when you are unable to move or be active. BEFORE THE PROCEDURE   If the spirometer includes an indicator to show your best effort, your nurse or  respiratory therapist will set it to a desired goal.  If possible, sit up straight or lean slightly forward. Try not to slouch.  Hold the incentive spirometer in an upright position. INSTRUCTIONS FOR USE  1. Sit on the edge of your bed if possible, or sit up as far as you can in bed or on a chair. 2. Hold the incentive spirometer in an upright position. 3. Breathe out normally. 4. Place the mouthpiece in your mouth and seal your lips tightly around it. 5. Breathe in slowly and as deeply as possible, raising the piston or the ball toward the top of the column. 6. Hold your breath for 3-5 seconds or for as long as possible. Allow the piston or ball to fall to the bottom of the column. 7. Remove the mouthpiece from your mouth and breathe out normally. 8. Rest for a few seconds and repeat Steps 1 through 7 at least 10 times every 1-2 hours when you are awake. Take your time and take a few normal breaths between deep breaths. 9. The spirometer may include an indicator to show your best effort. Use the indicator as a goal to work toward during each repetition. 10. After each set of 10 deep breaths, practice coughing to be sure your lungs are clear. If you have an incision (the cut made at the time of surgery), support your incision when coughing by placing a pillow or rolled up towels firmly against it. Once you are able to get out of bed, walk around indoors and cough well. You may stop using the incentive spirometer when instructed by your caregiver.  RISKS AND COMPLICATIONS  Take your time so you do not get dizzy or light-headed.  If you are in pain, you may need to take or ask for pain medication before doing incentive spirometry. It is harder to take a deep breath if you are having pain. AFTER USE  Rest and breathe slowly and easily.  It can be helpful to  keep track of a log of your progress. Your caregiver can provide you with a simple table to help with this. If you are using the  spirometer at home, follow these instructions: [place] IF:   You are having difficultly using the spirometer.  You have trouble using the spirometer as often as instructed.  Your pain medication is not giving enough relief while using the spirometer.  You develop fever of 100.5 F (38.1 C) or higher. SEEK IMMEDIATE MEDICAL CARE IF:   You cough up bloody sputum that had not been present before.  You develop fever of 102 F (38.9 C) or greater.  You develop worsening pain at or near the incision site. MAKE SURE YOU:   Understand these instructions.  Will watch your condition.  Will get help right away if you are not doing well or get worse. Document Released: 10/30/2006 Document Revised: 09/11/2011 Document Reviewed: 12/31/2006 [facility] Patient Information 2014 [place], [place].   ________________________________________________________________________

## 2014-01-13 NOTE — Progress Notes (Signed)
Called in Mupirocin 2% ointment to [facility], left message with Cordelia PenSherry at Dr. Nilsa Nuttinglin's office, informed pt of prescription. No questions or concerns at this time.

## 2014-01-14 NOTE — H&P (Signed)
TOTAL KNEE ADMISSION H&P  Patient is being admitted for left total knee arthroplasty.  Subjective:  Chief Complaint:   Left knee OA / pain.  HPI: Nancy Suarez, 80 y.o. female, has a history of pain and functional disability in the left knee due to arthritis and has failed non-surgical conservative treatments for greater than 12 weeks to includeNSAID's and/or analgesics, corticosteriod injections, viscosupplementation injections, use of assistive devices and activity modification.  Onset of symptoms was gradual, starting >10 years ago with gradually worsening course since that time. The patient noted no past surgery on the left knee(s).  Patient currently rates pain in the left knee(s) at 10 out of 10 with activity. Patient has night pain, worsening of pain with activity and weight bearing, pain that interferes with activities of daily living, pain with passive range of motion, crepitus and joint swelling.  Patient has evidence of periarticular osteophytes and joint space narrowing by imaging studies. There is no active infection.  Risks, benefits and expectations were discussed with the patient.  Risks including but not limited to the risk of anesthesia, blood clots, nerve damage, blood vessel damage, failure of the prosthesis, infection and up to and including death.  Patient understand the risks, benefits and expectations and wishes to proceed with surgery.   PCP: Pcp Not In System  D/C Plans:      SNF  Post-op Meds:       No Rx given   Tranexamic Acid:      To be given - IV    Decadron:      To be given  FYI:     Xarelto (14 days, then 81 mg ASA)   Oxycodone (used to taking Percocet)  Patient Active Problem List   Diagnosis Date Noted  . Scoliosis   . Osteopenia   . Chronic insomnia   . Atrophic vaginitis   . Spinal arthritis   . GERD (gastroesophageal reflux disease)   . ASCUS (atypical squamous cells of undetermined significance) on Pap smear   . Ovarian cyst   . LGSIL (low  grade squamous intraepithelial dysplasia)    Past Medical History  Diagnosis Date  . Scoliosis   . Osteopenia   . Chronic insomnia   . Atrophic vaginitis   . Spinal arthritis   . GERD (gastroesophageal reflux disease)   . ASCUS (atypical squamous cells of undetermined significance) on Pap smear   . Ovarian cyst     "shrank it"  . LGSIL (low grade squamous intraepithelial dysplasia)   . Hypertension   . Hypercholesteremia   . Goiter   . Depression   . History of kidney stones   . Pancreatitis     Past Surgical History  Procedure Laterality Date  . Kidney stone surgery  1990's    2-3 stones x1 surgery  . Wisdom tooth extraction    . Tonsillectomy  as teenager  . Abdominal hysterectomy  1977    partial    No prescriptions prior to admission   Allergies  Allergen Reactions  . Aspirin     Upsets stomach if 325mg   . Codeine Nausea And Vomiting  . Sulfa Antibiotics Nausea Only    History  Substance Use Topics  . Smoking status: Former Smoker -- 20 years    Types: Cigarettes    Quit date: 07/03/1978  . Smokeless tobacco: Never Used  . Alcohol Use: Yes     Comment: social    Family History  Problem Relation Age of Onset  .  Hypertension Father      Review of Systems  Constitutional: Negative.   HENT: Negative.   Eyes: Negative.   Respiratory: Negative.   Cardiovascular: Negative.   Gastrointestinal: Positive for heartburn and constipation.  Genitourinary: Negative.   Musculoskeletal: Positive for back pain and joint pain.  Skin: Negative.   Neurological: Negative.   Endo/Heme/Allergies: Positive for environmental allergies.  Psychiatric/Behavioral: Positive for depression. The patient has insomnia.     Objective:  Physical Exam  Constitutional: She is oriented to person, place, and time. She appears well-developed and well-nourished.  HENT:  Head: Normocephalic and atraumatic.  Mouth/Throat: Oropharynx is clear and moist. She has dentures (partial).   Eyes: Pupils are equal, round, and reactive to light.  Neck: Neck supple. No JVD present. No tracheal deviation present. No thyromegaly present.  Cardiovascular: Normal rate, regular rhythm and intact distal pulses.   Murmur heard. Respiratory: Effort normal and breath sounds normal. No stridor. No respiratory distress. She has no wheezes.  GI: Soft. There is no tenderness. There is no guarding.  Musculoskeletal:       Left knee: She exhibits decreased range of motion, swelling and bony tenderness. She exhibits no deformity, no laceration, no erythema and normal alignment. Tenderness found.  Lymphadenopathy:    She has no cervical adenopathy.  Neurological: She is alert and oriented to person, place, and time.  Skin: Skin is warm and dry.  Psychiatric: She has a normal mood and affect.     Labs:  Estimated body mass index is 29.06 kg/(m^2) as calculated from the following:   Height as of 04/04/12: 5\' 3"  (1.6 m).   Weight as of 04/04/12: 74.39 kg (164 lb).   Imaging Review Plain radiographs demonstrate severe degenerative joint disease of the left knee(s). The overall alignment is mild varus. The bone quality appears to be good for age and reported activity level.  Assessment/Plan:  End stage arthritis, left knee   The patient history, physical examination, clinical judgment of the provider and imaging studies are consistent with end stage degenerative joint disease of the left knee(s) and total knee arthroplasty is deemed medically necessary. The treatment options including medical management, injection therapy arthroscopy and arthroplasty were discussed at length. The risks and benefits of total knee arthroplasty were presented and reviewed. The risks due to aseptic loosening, infection, stiffness, patella tracking problems, thromboembolic complications and other imponderables were discussed. The patient acknowledged the explanation, agreed to proceed with the plan and consent was  signed. Patient is being admitted for inpatient treatment for surgery, pain control, PT, OT, prophylactic antibiotics, VTE prophylaxis, progressive ambulation and ADL's and discharge planning. The patient is planning to be discharged to skilled nursing facility.     Anastasio AuerbachMatthew S. Jeani Fassnacht   PA-C  01/14/2014, 11:03 AM

## 2014-01-19 ENCOUNTER — Inpatient Hospital Stay (HOSPITAL_COMMUNITY)
Admission: RE | Admit: 2014-01-19 | Discharge: 2014-01-21 | DRG: 470 | Disposition: A | Discharge status: Skilled Nursing Facility | Payer: Medicare Other | Source: Ambulatory Visit | Attending: Orthopedic Surgery | Admitting: Orthopedic Surgery

## 2014-01-19 ENCOUNTER — Encounter (HOSPITAL_COMMUNITY)
Admission: RE | Disposition: A | Discharge status: Skilled Nursing Facility | Payer: Self-pay | Source: Ambulatory Visit | Attending: Orthopedic Surgery

## 2014-01-19 ENCOUNTER — Inpatient Hospital Stay (HOSPITAL_COMMUNITY): Payer: Medicare Other | Admitting: Anesthesiology

## 2014-01-19 ENCOUNTER — Encounter (HOSPITAL_COMMUNITY): Payer: Medicare Other | Admitting: Anesthesiology

## 2014-01-19 ENCOUNTER — Encounter (HOSPITAL_COMMUNITY): Payer: Self-pay

## 2014-01-19 DIAGNOSIS — F329 Major depressive disorder, single episode, unspecified: Secondary | ICD-10-CM | POA: Diagnosis present

## 2014-01-19 DIAGNOSIS — M1710 Unilateral primary osteoarthritis, unspecified knee: Principal | ICD-10-CM | POA: Diagnosis present

## 2014-01-19 DIAGNOSIS — Z6825 Body mass index (BMI) 25.0-25.9, adult: Secondary | ICD-10-CM

## 2014-01-19 DIAGNOSIS — E7800 Pure hypercholesterolemia, unspecified: Secondary | ICD-10-CM | POA: Diagnosis present

## 2014-01-19 DIAGNOSIS — D62 Acute posthemorrhagic anemia: Secondary | ICD-10-CM

## 2014-01-19 DIAGNOSIS — Z87891 Personal history of nicotine dependence: Secondary | ICD-10-CM

## 2014-01-19 DIAGNOSIS — Z96659 Presence of unspecified artificial knee joint: Secondary | ICD-10-CM

## 2014-01-19 DIAGNOSIS — K219 Gastro-esophageal reflux disease without esophagitis: Secondary | ICD-10-CM | POA: Diagnosis present

## 2014-01-19 DIAGNOSIS — M6580 Other synovitis and tenosynovitis, unspecified site: Secondary | ICD-10-CM | POA: Diagnosis present

## 2014-01-19 DIAGNOSIS — I10 Essential (primary) hypertension: Secondary | ICD-10-CM | POA: Diagnosis present

## 2014-01-19 DIAGNOSIS — E663 Overweight: Secondary | ICD-10-CM | POA: Diagnosis present

## 2014-01-19 DIAGNOSIS — Z87442 Personal history of urinary calculi: Secondary | ICD-10-CM

## 2014-01-19 DIAGNOSIS — M899 Disorder of bone, unspecified: Secondary | ICD-10-CM | POA: Diagnosis present

## 2014-01-19 DIAGNOSIS — M949 Disorder of cartilage, unspecified: Secondary | ICD-10-CM

## 2014-01-19 DIAGNOSIS — M25569 Pain in unspecified knee: Secondary | ICD-10-CM | POA: Diagnosis present

## 2014-01-19 DIAGNOSIS — F3289 Other specified depressive episodes: Secondary | ICD-10-CM | POA: Diagnosis present

## 2014-01-19 DIAGNOSIS — Z96652 Presence of left artificial knee joint: Secondary | ICD-10-CM

## 2014-01-19 HISTORY — PX: TOTAL KNEE ARTHROPLASTY: SHX125

## 2014-01-19 LAB — TYPE AND SCREEN
ABO/RH(D): O POS
ANTIBODY SCREEN: NEGATIVE

## 2014-01-19 SURGERY — ARTHROPLASTY, KNEE, TOTAL
Anesthesia: Spinal | Site: Knee | Laterality: Left

## 2014-01-19 MED ORDER — FERROUS SULFATE 325 (65 FE) MG PO TABS
325.0000 mg | ORAL_TABLET | Freq: Three times a day (TID) | ORAL | Status: DC
  Administered 2014-01-19 – 2014-01-21 (×5): 325 mg via ORAL
  Filled 2014-01-19 (×8): qty 1

## 2014-01-19 MED ORDER — OXYCODONE HCL 5 MG PO TABS
5.0000 mg | ORAL_TABLET | ORAL | Status: DC
  Administered 2014-01-19: 5 mg via ORAL
  Administered 2014-01-20 (×5): 10 mg via ORAL
  Administered 2014-01-20: 5 mg via ORAL
  Administered 2014-01-21 (×2): 15 mg via ORAL
  Filled 2014-01-19 (×2): qty 1
  Filled 2014-01-19 (×2): qty 2
  Filled 2014-01-19: qty 3
  Filled 2014-01-19 (×2): qty 2
  Filled 2014-01-19: qty 3
  Filled 2014-01-19: qty 2

## 2014-01-19 MED ORDER — DEXAMETHASONE SODIUM PHOSPHATE 10 MG/ML IJ SOLN
INTRAMUSCULAR | Status: AC
  Filled 2014-01-19: qty 1

## 2014-01-19 MED ORDER — PROPOFOL 10 MG/ML IV BOLUS
INTRAVENOUS | Status: AC
  Filled 2014-01-19: qty 20

## 2014-01-19 MED ORDER — OLMESARTAN MEDOXOMIL-HCTZ 40-25 MG PO TABS: 1.0000 | ORAL_TABLET | Freq: Every morning | ORAL | Status: DC

## 2014-01-19 MED ORDER — BUPIVACAINE-EPINEPHRINE (PF) 0.25% -1:200000 IJ SOLN
INTRAMUSCULAR | Status: AC
  Filled 2014-01-19: qty 30

## 2014-01-19 MED ORDER — ATORVASTATIN CALCIUM 10 MG PO TABS
10.0000 mg | ORAL_TABLET | Freq: Every day | ORAL | Status: DC
  Administered 2014-01-19 – 2014-01-21 (×3): 10 mg via ORAL
  Filled 2014-01-19 (×3): qty 1

## 2014-01-19 MED ORDER — PANTOPRAZOLE SODIUM 40 MG PO TBEC
40.0000 mg | DELAYED_RELEASE_TABLET | Freq: Every day | ORAL | Status: DC
  Administered 2014-01-20 – 2014-01-21 (×2): 40 mg via ORAL
  Filled 2014-01-19 (×2): qty 1

## 2014-01-19 MED ORDER — ZOLPIDEM TARTRATE 5 MG PO TABS
5.0000 mg | ORAL_TABLET | Freq: Every evening | ORAL | Status: DC | PRN
  Administered 2014-01-19 – 2014-01-20 (×2): 5 mg via ORAL
  Filled 2014-01-19 (×2): qty 1

## 2014-01-19 MED ORDER — DIPHENHYDRAMINE HCL 25 MG PO CAPS: 25.0000 mg | ORAL_CAPSULE | Freq: Four times a day (QID) | ORAL | Status: DC | PRN

## 2014-01-19 MED ORDER — DOCUSATE SODIUM 100 MG PO CAPS
100.0000 mg | ORAL_CAPSULE | Freq: Two times a day (BID) | ORAL | Status: DC
  Administered 2014-01-19 – 2014-01-21 (×4): 100 mg via ORAL

## 2014-01-19 MED ORDER — BUPIVACAINE LIPOSOME 1.3 % IJ SUSP
20.0000 mL | Freq: Once | INTRAMUSCULAR | Status: AC
  Administered 2014-01-19: 20 mL
  Filled 2014-01-19: qty 20

## 2014-01-19 MED ORDER — FENTANYL CITRATE 0.05 MG/ML IJ SOLN: 25.0000 ug | INTRAMUSCULAR | Status: DC | PRN

## 2014-01-19 MED ORDER — ONDANSETRON HCL 4 MG PO TABS: 4.0000 mg | ORAL_TABLET | Freq: Four times a day (QID) | ORAL | Status: DC | PRN

## 2014-01-19 MED ORDER — IRBESARTAN 300 MG PO TABS
300.0000 mg | ORAL_TABLET | Freq: Every day | ORAL | Status: DC
  Administered 2014-01-20 – 2014-01-21 (×2): 300 mg via ORAL
  Filled 2014-01-19 (×2): qty 1

## 2014-01-19 MED ORDER — MIDAZOLAM HCL 2 MG/2ML IJ SOLN
INTRAMUSCULAR | Status: AC
  Filled 2014-01-19: qty 2

## 2014-01-19 MED ORDER — COLESEVELAM HCL 625 MG PO TABS
625.0000 mg | ORAL_TABLET | ORAL | Status: DC
  Administered 2014-01-19 – 2014-01-21 (×2): 625 mg via ORAL
  Filled 2014-01-19 (×2): qty 1

## 2014-01-19 MED ORDER — METHOCARBAMOL 500 MG PO TABS
500.0000 mg | ORAL_TABLET | Freq: Four times a day (QID) | ORAL | Status: DC | PRN
  Administered 2014-01-20 (×2): 500 mg via ORAL
  Filled 2014-01-19 (×2): qty 1

## 2014-01-19 MED ORDER — METHOCARBAMOL 1000 MG/10ML IJ SOLN
500.0000 mg | Freq: Four times a day (QID) | INTRAVENOUS | Status: DC | PRN
  Filled 2014-01-19: qty 5

## 2014-01-19 MED ORDER — PHENYLEPHRINE HCL 10 MG/ML IJ SOLN
INTRAMUSCULAR | Status: DC | PRN
  Administered 2014-01-19: 120 ug via INTRAVENOUS
  Administered 2014-01-19: 40 ug via INTRAVENOUS
  Administered 2014-01-19: 80 ug via INTRAVENOUS
  Administered 2014-01-19 (×3): 40 ug via INTRAVENOUS
  Administered 2014-01-19 (×2): 80 ug via INTRAVENOUS

## 2014-01-19 MED ORDER — FENTANYL CITRATE 0.05 MG/ML IJ SOLN
INTRAMUSCULAR | Status: AC
  Filled 2014-01-19: qty 2

## 2014-01-19 MED ORDER — 0.9 % SODIUM CHLORIDE (POUR BTL) OPTIME
TOPICAL | Status: DC | PRN
  Administered 2014-01-19: 1000 mL

## 2014-01-19 MED ORDER — LORATADINE 10 MG PO TABS
10.0000 mg | ORAL_TABLET | Freq: Every day | ORAL | Status: DC
  Administered 2014-01-20 – 2014-01-21 (×2): 10 mg via ORAL
  Filled 2014-01-19 (×2): qty 1

## 2014-01-19 MED ORDER — PHENYLEPHRINE 40 MCG/ML (10ML) SYRINGE FOR IV PUSH (FOR BLOOD PRESSURE SUPPORT)
PREFILLED_SYRINGE | INTRAVENOUS | Status: AC
  Filled 2014-01-19: qty 10

## 2014-01-19 MED ORDER — ONDANSETRON HCL 4 MG/2ML IJ SOLN: 4.0000 mg | Freq: Four times a day (QID) | INTRAMUSCULAR | Status: DC | PRN

## 2014-01-19 MED ORDER — MIDAZOLAM HCL 5 MG/5ML IJ SOLN
INTRAMUSCULAR | Status: DC | PRN
  Administered 2014-01-19 (×2): 1 mg via INTRAVENOUS

## 2014-01-19 MED ORDER — PROMETHAZINE HCL 25 MG/ML IJ SOLN: 6.2500 mg | INTRAMUSCULAR | Status: DC | PRN

## 2014-01-19 MED ORDER — POLYETHYLENE GLYCOL 3350 17 G PO PACK
17.0000 g | PACK | Freq: Two times a day (BID) | ORAL | Status: DC
  Administered 2014-01-19 – 2014-01-21 (×4): 17 g via ORAL

## 2014-01-19 MED ORDER — BISACODYL 10 MG RE SUPP: 10.0000 mg | Freq: Every day | RECTAL | Status: DC | PRN

## 2014-01-19 MED ORDER — CEFAZOLIN SODIUM-DEXTROSE 2-3 GM-% IV SOLR
2.0000 g | INTRAVENOUS | Status: AC
  Administered 2014-01-19: 2 g via INTRAVENOUS

## 2014-01-19 MED ORDER — SODIUM CHLORIDE 0.9 % IJ SOLN
INTRAMUSCULAR | Status: AC
  Filled 2014-01-19: qty 10

## 2014-01-19 MED ORDER — LIDOCAINE HCL (CARDIAC) 20 MG/ML IV SOLN
INTRAVENOUS | Status: AC
  Filled 2014-01-19: qty 5

## 2014-01-19 MED ORDER — ONDANSETRON HCL 4 MG/2ML IJ SOLN
INTRAMUSCULAR | Status: DC | PRN
  Administered 2014-01-19: 4 mg via INTRAVENOUS

## 2014-01-19 MED ORDER — CEFAZOLIN SODIUM-DEXTROSE 2-3 GM-% IV SOLR
INTRAVENOUS | Status: AC
  Filled 2014-01-19: qty 50

## 2014-01-19 MED ORDER — HYDROMORPHONE HCL PF 1 MG/ML IJ SOLN
0.5000 mg | INTRAMUSCULAR | Status: DC | PRN
  Administered 2014-01-19: 1 mg via INTRAVENOUS
  Filled 2014-01-19: qty 1

## 2014-01-19 MED ORDER — SODIUM CHLORIDE 0.9 % IV SOLN
INTRAVENOUS | Status: DC
  Administered 2014-01-19 – 2014-01-20 (×2): via INTRAVENOUS
  Filled 2014-01-19 (×11): qty 1000

## 2014-01-19 MED ORDER — METOCLOPRAMIDE HCL 10 MG PO TABS: 5.0000 mg | ORAL_TABLET | Freq: Three times a day (TID) | ORAL | Status: DC | PRN

## 2014-01-19 MED ORDER — LIDOCAINE HCL (CARDIAC) 20 MG/ML IV SOLN
INTRAVENOUS | Status: DC | PRN
  Administered 2014-01-19: 50 mg via INTRAVENOUS

## 2014-01-19 MED ORDER — LACTATED RINGERS IV SOLN
INTRAVENOUS | Status: DC
  Administered 2014-01-19: 14:00:00 via INTRAVENOUS
  Administered 2014-01-19: 1000 mL via INTRAVENOUS

## 2014-01-19 MED ORDER — SODIUM CHLORIDE 0.9 % IJ SOLN
INTRAMUSCULAR | Status: DC | PRN
  Administered 2014-01-19: 9 mL via INTRAVENOUS

## 2014-01-19 MED ORDER — MENTHOL 3 MG MT LOZG: 1.0000 | LOZENGE | OROMUCOSAL | Status: DC | PRN

## 2014-01-19 MED ORDER — KETOROLAC TROMETHAMINE 30 MG/ML IJ SOLN
INTRAMUSCULAR | Status: AC
  Filled 2014-01-19: qty 1

## 2014-01-19 MED ORDER — BUPIVACAINE-EPINEPHRINE (PF) 0.25% -1:200000 IJ SOLN
INTRAMUSCULAR | Status: DC | PRN
  Administered 2014-01-19: 30 mL via PERINEURAL

## 2014-01-19 MED ORDER — MIRTAZAPINE 15 MG PO TABS
15.0000 mg | ORAL_TABLET | Freq: Every day | ORAL | Status: DC
  Administered 2014-01-19 – 2014-01-20 (×2): 15 mg via ORAL
  Filled 2014-01-19 (×3): qty 1

## 2014-01-19 MED ORDER — MAGNESIUM CITRATE PO SOLN: 1.0000 | Freq: Once | ORAL | Status: AC | PRN

## 2014-01-19 MED ORDER — CARBOXYMETHYLCELLULOSE SODIUM 0.5 % OP SOLN: 1.0000 [drp] | OPHTHALMIC | Status: DC | PRN

## 2014-01-19 MED ORDER — FENTANYL CITRATE 0.05 MG/ML IJ SOLN
INTRAMUSCULAR | Status: DC | PRN
  Administered 2014-01-19 (×2): 50 ug via INTRAVENOUS

## 2014-01-19 MED ORDER — PROPOFOL INFUSION 10 MG/ML OPTIME
INTRAVENOUS | Status: DC | PRN
  Administered 2014-01-19: 75 ug/kg/min via INTRAVENOUS

## 2014-01-19 MED ORDER — ALUM & MAG HYDROXIDE-SIMETH 200-200-20 MG/5ML PO SUSP
30.0000 mL | ORAL | Status: DC | PRN
  Administered 2014-01-19: 30 mL via ORAL
  Filled 2014-01-19: qty 30

## 2014-01-19 MED ORDER — CEFAZOLIN SODIUM-DEXTROSE 2-3 GM-% IV SOLR
2.0000 g | Freq: Four times a day (QID) | INTRAVENOUS | Status: AC
  Administered 2014-01-19 – 2014-01-20 (×2): 2 g via INTRAVENOUS
  Filled 2014-01-19 (×2): qty 50

## 2014-01-19 MED ORDER — METOCLOPRAMIDE HCL 5 MG/ML IJ SOLN: 5.0000 mg | Freq: Three times a day (TID) | INTRAMUSCULAR | Status: DC | PRN

## 2014-01-19 MED ORDER — PHENOL 1.4 % MT LIQD: 1.0000 | OROMUCOSAL | Status: DC | PRN

## 2014-01-19 MED ORDER — RIVAROXABAN 10 MG PO TABS
10.0000 mg | ORAL_TABLET | ORAL | Status: DC
  Administered 2014-01-20 – 2014-01-21 (×2): 10 mg via ORAL
  Filled 2014-01-19 (×3): qty 1

## 2014-01-19 MED ORDER — KETOROLAC TROMETHAMINE 30 MG/ML IJ SOLN
INTRAMUSCULAR | Status: DC | PRN
  Administered 2014-01-19: 30 mg via INTRAVENOUS

## 2014-01-19 MED ORDER — POLYVINYL ALCOHOL 1.4 % OP SOLN
1.0000 [drp] | OPHTHALMIC | Status: DC | PRN
  Filled 2014-01-19: qty 15

## 2014-01-19 MED ORDER — LACTATED RINGERS IV SOLN: INTRAVENOUS | Status: DC

## 2014-01-19 MED ORDER — ONDANSETRON HCL 4 MG/2ML IJ SOLN
INTRAMUSCULAR | Status: AC
  Filled 2014-01-19: qty 2

## 2014-01-19 MED ORDER — BUPIVACAINE IN DEXTROSE 0.75-8.25 % IT SOLN
INTRATHECAL | Status: DC | PRN
  Administered 2014-01-19: 1.5 mL via INTRATHECAL

## 2014-01-19 MED ORDER — DEXAMETHASONE SODIUM PHOSPHATE 10 MG/ML IJ SOLN
10.0000 mg | Freq: Once | INTRAMUSCULAR | Status: AC
  Administered 2014-01-19: 10 mg via INTRAVENOUS

## 2014-01-19 MED ORDER — DEXAMETHASONE SODIUM PHOSPHATE 10 MG/ML IJ SOLN
10.0000 mg | Freq: Once | INTRAMUSCULAR | Status: AC
  Administered 2014-01-20: 10 mg via INTRAVENOUS
  Filled 2014-01-19: qty 1

## 2014-01-19 MED ORDER — TRANEXAMIC ACID 100 MG/ML IV SOLN
1000.0000 mg | Freq: Once | INTRAVENOUS | Status: AC
  Administered 2014-01-19: 1000 mg via INTRAVENOUS
  Filled 2014-01-19: qty 10

## 2014-01-19 MED ORDER — HYDROCHLOROTHIAZIDE 25 MG PO TABS
25.0000 mg | ORAL_TABLET | Freq: Every day | ORAL | Status: DC
  Administered 2014-01-20 – 2014-01-21 (×2): 25 mg via ORAL
  Filled 2014-01-19 (×2): qty 1

## 2014-01-19 SURGICAL SUPPLY — 52 items
BAG ZIPLOCK 12X15 (MISCELLANEOUS) IMPLANT
BANDAGE ELASTIC 6 VELCRO ST LF (GAUZE/BANDAGES/DRESSINGS) ×2 IMPLANT
BANDAGE ESMARK 6X9 LF (GAUZE/BANDAGES/DRESSINGS) ×1 IMPLANT
BLADE SAW SGTL 13.0X1.19X90.0M (BLADE) ×2 IMPLANT
BNDG ESMARK 6X9 LF (GAUZE/BANDAGES/DRESSINGS) ×2
BOWL SMART MIX CTS (DISPOSABLE) ×2 IMPLANT
CAPT RP KNEE ×2 IMPLANT
CEMENT HV SMART SET (Cement) ×4 IMPLANT
CUFF TOURN SGL QUICK 34 (TOURNIQUET CUFF) ×1
CUFF TRNQT CYL 34X4X40X1 (TOURNIQUET CUFF) ×1 IMPLANT
DERMABOND ADVANCED (GAUZE/BANDAGES/DRESSINGS) ×1
DERMABOND ADVANCED .7 DNX12 (GAUZE/BANDAGES/DRESSINGS) ×1 IMPLANT
DRAPE EXTREMITY T 121X128X90 (DRAPE) ×2 IMPLANT
DRAPE POUCH INSTRU U-SHP 10X18 (DRAPES) ×2 IMPLANT
DRAPE U-SHAPE 47X51 STRL (DRAPES) ×2 IMPLANT
DRSG AQUACEL AG ADV 3.5X10 (GAUZE/BANDAGES/DRESSINGS) ×2 IMPLANT
DRSG TEGADERM 4X4.75 (GAUZE/BANDAGES/DRESSINGS) IMPLANT
DURAPREP 26ML APPLICATOR (WOUND CARE) ×4 IMPLANT
ELECT REM PT RETURN 9FT ADLT (ELECTROSURGICAL) ×2
ELECTRODE REM PT RTRN 9FT ADLT (ELECTROSURGICAL) ×1 IMPLANT
FACESHIELD WRAPAROUND (MASK) ×10 IMPLANT
GAUZE SPONGE 2X2 8PLY STRL LF (GAUZE/BANDAGES/DRESSINGS) IMPLANT
GLOVE BIOGEL PI IND STRL 7.5 (GLOVE) ×1 IMPLANT
GLOVE BIOGEL PI IND STRL 8 (GLOVE) ×1 IMPLANT
GLOVE BIOGEL PI INDICATOR 7.5 (GLOVE) ×1
GLOVE BIOGEL PI INDICATOR 8 (GLOVE) ×1
GLOVE ECLIPSE 8.0 STRL XLNG CF (GLOVE) ×2 IMPLANT
GLOVE ORTHO TXT STRL SZ7.5 (GLOVE) ×4 IMPLANT
GOWN SPEC L3 XXLG W/TWL (GOWN DISPOSABLE) ×2 IMPLANT
GOWN STRL REUS W/TWL LRG LVL3 (GOWN DISPOSABLE) ×2 IMPLANT
HANDPIECE INTERPULSE COAX TIP (DISPOSABLE) ×1
KIT BASIN OR (CUSTOM PROCEDURE TRAY) ×2 IMPLANT
MANIFOLD NEPTUNE II (INSTRUMENTS) ×2 IMPLANT
NDL SAFETY ECLIPSE 18X1.5 (NEEDLE) ×1 IMPLANT
NEEDLE HYPO 18GX1.5 SHARP (NEEDLE) ×1
PACK TOTAL JOINT (CUSTOM PROCEDURE TRAY) ×2 IMPLANT
POSITIONER SURGICAL ARM (MISCELLANEOUS) ×2 IMPLANT
SET HNDPC FAN SPRY TIP SCT (DISPOSABLE) ×1 IMPLANT
SET PAD KNEE POSITIONER (MISCELLANEOUS) ×2 IMPLANT
SPONGE GAUZE 2X2 STER 10/PKG (GAUZE/BANDAGES/DRESSINGS)
SUCTION FRAZIER 12FR DISP (SUCTIONS) ×2 IMPLANT
SUT MNCRL AB 4-0 PS2 18 (SUTURE) ×2 IMPLANT
SUT VIC AB 1 CT1 36 (SUTURE) ×2 IMPLANT
SUT VIC AB 2-0 CT1 27 (SUTURE) ×3
SUT VIC AB 2-0 CT1 TAPERPNT 27 (SUTURE) ×3 IMPLANT
SUT VLOC 180 0 24IN GS25 (SUTURE) ×2 IMPLANT
SYRINGE 60CC LL (MISCELLANEOUS) ×2 IMPLANT
TOWEL OR 17X26 10 PK STRL BLUE (TOWEL DISPOSABLE) ×2 IMPLANT
TOWEL OR NON WOVEN STRL DISP B (DISPOSABLE) IMPLANT
TRAY FOLEY CATH 14FRSI W/METER (CATHETERS) ×2 IMPLANT
WATER STERILE IRR 1500ML POUR (IV SOLUTION) ×2 IMPLANT
WRAP KNEE MAXI GEL POST OP (GAUZE/BANDAGES/DRESSINGS) ×2 IMPLANT

## 2014-01-19 NOTE — Op Note (Signed)
NAME:  Nancy Suarez                      MEDICAL RECORD NO.:  [id number]                             FACILITY:  [facility]      PHYSICIAN:  Madlyn Frankel. Charlann Boxer, M.D.  DATE OF BIRTH:  01-13-1932      DATE OF PROCEDURE:  01/19/2014                                     OPERATIVE REPORT         PREOPERATIVE DIAGNOSIS:  Left knee osteoarthritis.      POSTOPERATIVE DIAGNOSIS:  Left knee osteoarthritis.      FINDINGS:  The patient was noted to have complete loss of cartilage and   bone-on-bone arthritis with associated osteophytes in the medial and patellofemoral compartments of   the knee with a significant synovitis and associated effusion.      PROCEDURE:  Left total knee replacement.      COMPONENTS USED:  DePuy rotating platform posterior stabilized knee   system, a size 4N femur, 3 tibia, 10 mm PS insert, and 38 patellar   button.      SURGEON:  Madlyn Frankel. Charlann Boxer, M.D.      ASSISTANT:  Lanney Gins, PA-C.      ANESTHESIA:  Spinal.      SPECIMENS:  None.      COMPLICATION:  None.      DRAINS:  None.  EBL: <100      TOURNIQUET TIME:   Total Tourniquet Time Documented: Thigh (Right) - 32 minutes Total: Thigh (Right) - 32 minutes  .      The patient was stable to the recovery room.      INDICATION FOR PROCEDURE:  Nancy Suarez is a 80 y.o. female patient of   mine.  The patient had been seen, evaluated, and treated conservatively in the   office with medication, activity modification, and injections.  The patient had   radiographic changes of bone-on-bone arthritis with endplate sclerosis and osteophytes noted.      The patient failed conservative measures including medication, injections, and activity modification, and at this point was ready for more definitive measures.   Based on the radiographic changes and failed conservative measures, the patient   decided to proceed with total knee replacement.  Risks of infection,   DVT, component failure, need for revision surgery,  postop course, and   expectations were all   discussed and reviewed.  Consent was obtained for benefit of pain   relief.      PROCEDURE IN DETAIL:  The patient was brought to the operative theater.   Once adequate anesthesia, preoperative antibiotics, 2 gm of Ancef administered, the patient was positioned supine with the left thigh tourniquet placed.  The  left lower extremity was prepped and draped in sterile fashion.  A time-   out was performed identifying the patient, planned procedure, and   extremity.      The left lower extremity was placed in the [employer] leg holder.  The leg was   exsanguinated, tourniquet elevated to 250 mmHg.  A midline incision was   made followed by median parapatellar arthrotomy.  Following initial   exposure, attention was first  directed to the patella.  Precut   measurement was noted to be 21 mm.  I resected down to 14 mm and used a   38 patellar button to restore patellar height as well as cover the cut   surface.      The lug holes were drilled and a metal shim was placed to protect the   patella from retractors and saw blades.      At this point, attention was now directed to the femur.  The femoral   canal was opened with a drill, irrigated to try to prevent fat emboli.  An   intramedullary rod was passed at 5 degrees valgus, 10 mm of bone was   resected off the distal femur.  Following this resection, the tibia was   subluxated anteriorly.  Using the extramedullary guide, 2 mm of bone was resected off   the proximal medial tibia.  We confirmed the gap would be   stable medially and laterally with a 10 mm insert as well as confirmed   the cut was perpendicular in the coronal plane, checking with an alignment rod.      Once this was done, I sized the femur to be a size 4 in the anterior-   posterior dimension, chose a narrow component based on medial and   lateral dimension.  The size 4 rotation block was then pinned in   position anterior  referenced using the C-clamp to set rotation.  The   anterior, posterior, and  chamfer cuts were made without difficulty nor   notching making certain that I was along the anterior cortex to help   with flexion gap stability.      The final box cut was made off the lateral aspect of distal femur.      At this point, the tibia was sized to be a size 3, the size 3 tray was   then pinned in position through the medial third of the tubercle,   drilled, and keel punched.  Trial reduction was now carried with a 4N femur,  3 tibia, a 10 mm insert, and the 38 patella botton.  The knee was brought to   extension, full extension with good flexion stability with the patella   tracking through the trochlea without application of pressure.  Given   all these findings, the trial components removed.  Final components were   opened and cement was mixed.  The knee was irrigated with normal saline   solution and pulse lavage.  The synovial lining was   then injected with 20cc of Exparel, 30cc of 0.25% Marcaine with epinephrine and 1 cc of Toradol,   total of 61 cc.      The knee was irrigated.  Final implants were then cemented onto clean and   dried cut surfaces of bone with the knee brought to extension with a 10 mm trial insert.      Once the cement had fully cured, the excess cement was removed   throughout the knee.  I confirmed I was satisfied with the range of   motion and stability, and the final 10 mm PS insert was chosen.  It was   placed into the knee.      The tourniquet had been let down at 32 minutes.  No significant   hemostasis required.  The   extensor mechanism was then reapproximated using #1 Vicryl with the knee   in flexion.  The   remaining wound was  closed with 2-0 Vicryl and running 4-0 Monocryl.   The knee was cleaned, dried, dressed sterilely using Dermabond and   Aquacel dressing.  The patient was then   brought to recovery room in stable condition, tolerating the procedure    well.   Please note that Physician Assistant, Lanney GinsMatthew Babish, PA-C, was present for the entirety of the case, and was utilized for pre-operative positioning, peri-operative retractor management, general facilitation of the procedure.  He was also utilized for primary wound closure at the end of the case.              Madlyn FrankelMatthew D. Charlann Boxerlin, M.D.    01/19/2014 2:24 PM

## 2014-01-19 NOTE — Anesthesia Procedure Notes (Addendum)
Spinal  Patient location during procedure: OR End time: 01/19/2014 12:42 PM Staffing CRNA/Resident: ,  Performed by: anesthesiologist and resident/CRNA  Preanesthetic Checklist Completed: patient identified, site marked, surgical consent, pre-op evaluation, timeout performed, IV checked, risks and benefits discussed and monitors and equipment checked Spinal Block Patient position: sitting Prep: Betadine Patient monitoring: heart rate, continuous pulse ox and blood pressure Approach: midline Location: L3-4 Injection technique: single-shot Needle Needle type: Sprotte  Needle gauge: 24 G Needle length: 9 cm Assessment Sensory level: T6 Additional Notes Expiration date of kit checked and confirmed. Patient tolerated procedure well, without complications.     

## 2014-01-19 NOTE — Interval H&P Note (Signed)
History and Physical Interval Note:  01/19/2014 12:07 PM  Nancy Suarez  has presented today for surgery, with the diagnosis of LEFT KNEE OSTERARTHRITIS  The various methods of treatment have been discussed with the patient and family. After consideration of risks, benefits and other options for treatment, the patient has consented to  Procedure(s): LEFT TOTAL KNEE ARTHROPLASTY (Left) as a surgical intervention .  The patient's history has been reviewed, patient examined, no change in status, stable for surgery.  I have reviewed the patient's chart and labs.  Questions were answered to the patient's satisfaction.     Shelda PalLIN,Morgana Rowley D

## 2014-01-19 NOTE — Anesthesia Preprocedure Evaluation (Addendum)
Anesthesia Evaluation  Patient identified by MRN, date of birth, ID band Patient awake    Reviewed: Allergy & Precautions, H&P , NPO status , Patient's Chart, lab work & pertinent test results  Airway Mallampati: II TM Distance: >3 FB Neck ROM: Full    Dental  (+) Teeth Intact, Dental Advisory Given, Caps,    Pulmonary former smoker,  breath sounds clear to auscultation  Pulmonary exam normal       Cardiovascular hypertension, Pt. on medications Rhythm:Regular Rate:Normal     Neuro/Psych Depression Scoliosis, spinal arthritis negative neurological ROS     GI/Hepatic Neg liver ROS, GERD-  ,  Endo/Other  negative endocrine ROS  Renal/GU negative Renal ROS  negative genitourinary   Musculoskeletal negative musculoskeletal ROS (+)   Abdominal   Peds  Hematology negative hematology ROS (+)   Anesthesia Other Findings   Reproductive/Obstetrics                          Anesthesia Physical Anesthesia Plan  ASA: II  Anesthesia Plan: Spinal   Post-op Pain Management:    Induction: Intravenous  Airway Management Planned: Simple Face Mask  Additional Equipment:   Intra-op Plan:   Post-operative Plan:   Informed Consent: I have reviewed the patients History and Physical, chart, labs and discussed the procedure including the risks, benefits and alternatives for the proposed anesthesia with the patient or authorized representative who has indicated his/her understanding and acceptance.   Dental advisory given  Plan Discussed with: CRNA  Anesthesia Plan Comments:         Anesthesia Quick Evaluation

## 2014-01-19 NOTE — Transfer of Care (Signed)
Immediate Anesthesia Transfer of Care Note  Patient: Nancy Suarez  Procedure(s) Performed: Procedure(s): LEFT TOTAL KNEE ARTHROPLASTY (Left)  Patient Location: PACU  Anesthesia Type:Spinal  Level of Consciousness: awake, alert  and oriented  Airway & Oxygen Therapy: Patient Spontanous Breathing and Patient connected to face mask oxygen  Post-op Assessment: Report given to PACU RN and Post -op Vital signs reviewed and stable  Post vital signs: Reviewed and stable  Complications: No apparent anesthesia complications

## 2014-01-19 NOTE — Anesthesia Postprocedure Evaluation (Signed)
Anesthesia Post Note  Patient: Nancy Suarez  Procedure(s) Performed: Procedure(s) (LRB): LEFT TOTAL KNEE ARTHROPLASTY (Left)  Anesthesia type: Spinal  Patient location: PACU  Post pain: Pain level controlled  Post assessment: Post-op Vital signs reviewed  Last Vitals:  Filed Vitals:   01/19/14 1515  BP: 120/58  Pulse: 60  Temp:   Resp: 13    Post vital signs: Reviewed  Level of consciousness: sedated  Complications: No apparent anesthesia complications

## 2014-01-19 NOTE — Plan of Care (Signed)
Problem: Consults Goal: Diagnosis- Total Joint Replacement Revision Total Knee     

## 2014-01-20 ENCOUNTER — Encounter (HOSPITAL_COMMUNITY): Payer: Self-pay | Admitting: Orthopedic Surgery

## 2014-01-20 LAB — CBC
HEMATOCRIT: 27.6 % — AB (ref 36.0–46.0)
HEMOGLOBIN: 9 g/dL — AB (ref 12.0–15.0)
MCH: 29.6 pg (ref 26.0–34.0)
MCHC: 32.6 g/dL (ref 30.0–36.0)
MCV: 90.8 fL (ref 78.0–100.0)
Platelets: 170 10*3/uL (ref 150–400)
RBC: 3.04 MIL/uL — ABNORMAL LOW (ref 3.87–5.11)
RDW: 12.4 % (ref 11.5–15.5)
WBC: 8.3 10*3/uL (ref 4.0–10.5)

## 2014-01-20 LAB — BASIC METABOLIC PANEL
Anion gap: 9 (ref 5–15)
BUN: 25 mg/dL — ABNORMAL HIGH (ref 6–23)
CALCIUM: 9 mg/dL (ref 8.4–10.5)
CO2: 25 meq/L (ref 19–32)
Chloride: 104 mEq/L (ref 96–112)
Creatinine, Ser: 1.25 mg/dL — ABNORMAL HIGH (ref 0.50–1.10)
GFR calc Af Amer: 46 mL/min — ABNORMAL LOW (ref >90)
GFR calc non Af Amer: 40 mL/min — ABNORMAL LOW (ref >90)
GLUCOSE: 135 mg/dL — AB (ref 70–99)
POTASSIUM: 5.1 meq/L (ref 3.7–5.3)
Sodium: 138 mEq/L (ref 137–147)

## 2014-01-20 NOTE — Progress Notes (Signed)
CARE MANAGEMENT NOTE 01/20/2014  Patient:  Nancy Suarez,Nancy Suarez   Account Number:  [id number]  Date Initiated:  01/20/2014  Documentation initiated by:  DAVIS,RHONDA  Subjective/Objective Assessment:   PREOPERATIVE DIAGNOSIS:  Left knee osteoarthritis.     POSTOPERATIVE DIAGNOSIS:  Left knee osteoarthritis.     FINDINGS:  The patient was noted to have complete loss of cartilage and   bone-on-bone arthritis with associated oste     Action/Plan:   snf   Anticipated DC Date:  01/23/2014   Anticipated DC Plan:  SKILLED NURSING FACILITY  In-house referral  Clinical Social Worker      DC Planning Services  NA      [facility]  [facility]   Choice offered to / List presented to:  NA   DME arranged  NA      DME agency  NA     HH arranged  NA      HH agency  NA   Status of service:  In process, will continue to follow Medicare Important Message given?  NA - LOS <3 / Initial given by admissions (If response is "NO", the following Medicare IM given date fields will be blank) Date Medicare IM given:   Medicare IM given by:   Date Additional Medicare IM given:   Additional Medicare IM given by:    Discharge Disposition:    Per UR Regulation:  Reviewed for med. necessity/level of care/duration of stay  If discussed at Long Length of Stay Meetings, dates discussed:    Comments:  [id number]/Rhonda Lorrin MaisDavis, RN,BSN,CCM: [phone] Chart review for needs and updates. No discharge needs present at time of review. Next review due [id number].

## 2014-01-20 NOTE — Evaluation (Signed)
Occupational Therapy Evaluation Patient Details Name: Nancy Suarez MRN: [id number] DOB: 02/23/1932 Today's Date: 01/20/2014    History of Present Illness L TKA   Clinical Impression   Patient presents during OT evaluation as impulsive with recent history of falls at home.  Patient limited by pain during initial OT contact, although willing to participate.  Patient was independent with ADL prior to admission, and now requires assistance due to pain, limited knee range, and decreased functional mobility.  Patient will benefit from skilled OT intervention to increase independence with ADL.  Patient is planning short term SNF for continued rehab prior to discharge home.      Follow Up Recommendations  SNF    Equipment Recommendations  None recommended by OT    Recommendations for Other Services       Precautions / Restrictions Precautions Precautions: Fall;Knee Precaution Comments: fell 7x in last 2 months Restrictions Weight Bearing Restrictions: No Other Position/Activity Restrictions: WBAT      Mobility Bed Mobility Overal bed mobility: Needs Assistance Bed Mobility: Supine to Sit     Supine to sit: Min assist Sit to supine: Min assist   General bed mobility comments: to support LLE  Transfers Overall transfer level: Needs assistance Equipment used: Rolling walker (2 wheeled) Transfers: Sit to/from [employer] Sit to Stand: Min assist Stand pivot transfers: Min assist       General transfer comment: cues for hand placement and min A to rise from bed in lowest position    Balance Overall balance assessment: Needs assistance Sitting-balance support: No upper extremity supported;Feet supported Sitting balance-Leahy Scale: Good     Standing balance support: Bilateral upper extremity supported;During functional activity Standing balance-Leahy Scale: Poor                              ADL Overall ADL's : Needs  assistance/impaired Eating/Feeding: Independent;Sitting   Grooming: Wash/dry hands;Wash/dry face;Oral care;Applying deodorant;Brushing hair;Set up;Sitting   Upper Body Bathing: Set up;Sitting   Lower Body Bathing: Minimal assistance;Sit to/from stand   Upper Body Dressing : Set up;Sitting   Lower Body Dressing: Moderate assistance;Sit to/from stand   Toilet Transfer: Minimal assistance;RW   Toileting- Clothing Manipulation and Hygiene: Minimal assistance;Sit to/from stand       Functional mobility during ADLs: Minimal assistance General ADL Comments: moves quickly, cueing for safety, recent history of falls     Vision                     Perception Perception Perception Tested?: No   Praxis Praxis Praxis tested?: Not tested    Pertinent Vitals/Pain 10/10 with mobility     Hand Dominance Right   Extremity/Trunk Assessment Upper Extremity Assessment Upper Extremity Assessment: Overall WFL for tasks assessed   Lower Extremity Assessment Lower Extremity Assessment: Defer to PT evaluation LLE Deficits / Details: knee flexion AAROM 50*   Cervical / Trunk Assessment Cervical / Trunk Assessment: Other exceptions (scoliosis) Cervical / Trunk Exceptions: scoliosis   Communication Communication Communication: No difficulties   Cognition Arousal/Alertness: Awake/alert Behavior During Therapy: Impulsive (Moves very quickly) Overall Cognitive Status: Within Functional Limits for tasks assessed                     General Comments       Exercises       Shoulder Instructions      Home Living Family/patient expects to be discharged to::  Skilled nursing facility Living Arrangements: Spouse/significant other                           Home Equipment: Walker - 4 wheels;Cane - single point;Grab bars - tub/shower   Additional Comments: husband had a stroke 8 yrs ago      Prior Functioning/Environment Level of Independence: Independent  with assistive device(s)        Comments: used rollator and SPC, independent bathing and dressing    OT Diagnosis: Generalized weakness;Acute pain;Cognitive deficits   OT Problem List: Decreased strength;Impaired balance (sitting and/or standing);Decreased cognition;Pain;Decreased range of motion;Decreased safety awareness;Increased edema;Decreased activity tolerance;Decreased coordination;Decreased knowledge of use of DME or AE   OT Treatment/Interventions: Self-care/ADL training;DME and/or AE instruction;Therapeutic activities;Balance training;Therapeutic exercise;Cognitive remediation/compensation;Patient/family education    OT Goals(Current goals can be found in the care plan section) Acute Rehab OT Goals Patient Stated Goal: to be able to play with grandaughter OT Goal Formulation: With patient Time For Goal Achievement: 02/03/14 Potential to Achieve Goals: Good  OT Frequency: Min 2X/week   Barriers to D/C: Decreased caregiver support  husband with history of stroke       Co-evaluation              End of Session Equipment Utilized During Treatment: Rolling walker  Activity Tolerance: Patient tolerated treatment well Patient left: in bed;with call bell/phone within reach   Time: 1300-1325 OT Time Calculation (min): 25 min Charges:  OT General Charges $OT Visit: 1 Procedure OT Evaluation $Initial OT Evaluation Tier I: 1 Procedure OT Treatments $Self Care/Home Management : 23-37 mins G-Codes:    [name], Jettson Crable M 01/20/2014, 1:25 PM

## 2014-01-20 NOTE — Discharge Instructions (Signed)
Information on my medicine - XARELTO® (Rivaroxaban) ° °This medication education was reviewed with me or my healthcare representative as part of my discharge preparation.  The pharmacist that spoke with me during my hospital stay was:  Tc Kapusta L, RPH ° °Why was Xarelto® prescribed for you? °Xarelto® was prescribed for you to reduce the risk of blood clots forming after orthopedic surgery. The medical term for these abnormal blood clots is venous thromboembolism (VTE). ° °What do you need to know about xarelto® ? °Take your Xarelto® ONCE DAILY at the same time every day. °You may take it either with or without food. ° °If you have difficulty swallowing the tablet whole, you may crush it and mix in applesauce just prior to taking your dose. ° °Take Xarelto® exactly as prescribed by your doctor and DO NOT stop taking Xarelto® without talking to the doctor who prescribed the medication.  Stopping without other VTE prevention medication to take the place of Xarelto® may increase your risk of developing a clot. ° °After discharge, you should have regular check-up appointments with your healthcare provider that is prescribing your Xarelto®.   ° °What do you do if you miss a dose? °If you miss a dose, take it as soon as you remember on the same day then continue your regularly scheduled once daily regimen the next day. Do not take two doses of Xarelto® on the same day.  ° °Important Safety Information °A possible side effect of Xarelto® is bleeding. You should call your healthcare provider right away if you experience any of the following: °? Bleeding from an injury or your nose that does not stop. °? Unusual colored urine (red or dark brown) or unusual colored stools (red or black). °? Unusual bruising for unknown reasons. °? A serious fall or if you hit your head (even if there is no bleeding). ° °Some medicines may interact with Xarelto® and might increase your risk of bleeding while on Xarelto®. To help avoid this,  consult your healthcare provider or pharmacist prior to using any new prescription or non-prescription medications, including herbals, vitamins, non-steroidal anti-inflammatory drugs (NSAIDs) and supplements. ° °This website has more information on Xarelto®: www.xarelto.com. ° ° ° °

## 2014-01-20 NOTE — Progress Notes (Addendum)
Physical Therapy Treatment Patient Details Name: Nancy MinorRoxana R Grizzell MRN: [id number] DOB: 01/02/1932 Today's Date: 01/20/2014    History of Present Illness L TKA    PT Comments    **Pt doing well with mobility. Knee is more painful than in morning session. She walked 30' with RW and min A. Performed TKA exercises well. *  Follow Up Recommendations  SNF     Equipment Recommendations  None recommended by PT    Recommendations for Other Services OT consult     Precautions / Restrictions Precautions Precautions: Fall Precaution Comments: fell 7x in last 2 months Restrictions Weight Bearing Restrictions: No    Mobility  Bed Mobility Overal bed mobility: Needs Assistance Bed Mobility: Sit to Supine     Supine to sit: Min assist Sit to supine: Min assist   General bed mobility comments: to support LLE  Transfers Overall transfer level: Needs assistance Equipment used: Rolling walker (2 wheeled) Transfers: Sit to/from Stand Sit to Stand: Min assist         General transfer comment: cues for hand placement and min A to rise from recliner  Ambulation/Gait Ambulation/Gait assistance: Min assist Ambulation Distance (Feet): 30 Feet Assistive device: Rolling walker (2 wheeled) Gait Pattern/deviations: Wide base of support;Step-to pattern;Decreased step length - left   Gait velocity interpretation: Below normal speed for age/gender General Gait Details: min A balance, cues for positioning and sequencing with RW; distance limited by fatigue, pt has B flat feet (doesn't wear orthotics)   Stairs            Wheelchair Mobility    Modified Rankin (Stroke Patients Only)       Balance                                    Cognition Arousal/Alertness: Awake/alert Behavior During Therapy: WFL for tasks assessed/performed Overall Cognitive Status: Within Functional Limits for tasks assessed                      Exercises Total Joint  Exercises Ankle Circles/Pumps: AROM;Both;10 reps Quad Sets: AROM;Both;10 reps Heel Slides: AAROM;Left;10 reps;Seated Hip ABduction/ADduction: AAROM;Left;10 reps;Supine Straight Leg Raises: AAROM;Left;10 reps;Supine Long Arc Quad: AAROM;Left;10 reps;Seated Goniometric ROM: L knee flexion AAROM 55*    General Comments        Pertinent Vitals/Pain *8/10 L knee with walking Ice applied, premedicated (but seems to have worn off), RN aware**                                PT Goals (current goals can now be found in the care plan section) Acute Rehab PT Goals Patient Stated Goal: to be able to play with grandaughter (to be able to play with grandaughter) PT Goal Formulation: With patient Time For Goal Achievement: 01/27/14 Potential to Achieve Goals: Good Progress towards PT goals: Progressing toward goals    Frequency  7X/week    PT Plan Current plan remains appropriate    Co-evaluation             End of Session Equipment Utilized During Treatment: Gait belt Activity Tolerance: Patient limited by pain Patient left: in bed;with call bell/phone within reach     Time: 1145-1203 PT Time Calculation (min): 18 min  Charges:  $Gait Training: 8-22 mins $Therapeutic Exercise: 8-22 mins  G Codes:      Ralene Bathe Kistler 01/20/2014, 12:19 PM [phone]

## 2014-01-20 NOTE — Progress Notes (Signed)
Clinical Social Work Department BRIEF PSYCHOSOCIAL ASSESSMENT 01/20/2014  Patient:  Nancy Suarez, Nancy Suarez     Account Number:  [id number]     Admit date:  01/19/2014  Clinical Social Worker:  Earlie Server  Date/Time:  01/20/2014 03:30 PM  Referred by:  Physician  Date Referred:  01/20/2014 Referred for  SNF Placement   Other Referral:   Interview type:  Patient Other interview type:    PSYCHOSOCIAL DATA Living Status:  FAMILY Admitted from facility:   Level of care:   Primary support name:  [name] Primary support relationship to patient:  CHILD, ADULT Degree of support available:   Strong    CURRENT CONCERNS Current Concerns  Post-Acute Placement   Other Concerns:    SOCIAL WORK ASSESSMENT / PLAN CSW received referral in order to assist with DC planning. CSW reviewed chart and met with patient at bedside. CSW introduced myself and explained role.    Patient reports she recently had surgery and has planned to go to [place] at [place]. Patient reports dtr has been coordinating care and requested that CSW speak with dtr. CSW spoke with dtr who reports she toured [facility] and wants patient to go there because it is close to their home. CSW explained DC process along with insurance authorization needed.    CSW completed FL2 and spoke with Altha Harm at [facility] who confirms they can accept patient at [place] submitted clinicals for insurance authorization. CSW will continue to follow.   Assessment/plan status:  Psychosocial Support/Ongoing Assessment of Needs Other assessment/ plan:   Information/referral to community resources:   SNF list    PATIENT'S/FAMILY'S RESPONSE TO PLAN OF CARE: Patient alert and oriented but does not feel comfortable making decisions without dtr being involved. Dtr reports she works and wanted to be prepared for DC plans so she had already toured SNFs. Dtr thanked CSW for time and reports she will help patient until she is transferred to SNF.        Sindy Messing, LCSW (Coverage for [employer])

## 2014-01-20 NOTE — Progress Notes (Signed)
Patient ID: [name], female   DOB: 04/17/1932, 80 y.o.   MRN: [id number] Subjective: 1 Day Post-Op Procedure(s) (LRB): LEFT TOTAL KNEE ARTHROPLASTY (Left)    Patient reports pain as mild to moderate.  Happy to have this behind her but questions when she could get her other knee done.  Objective:   VITALS:   Filed Vitals:   01/20/14 0951  BP: 138/46  Pulse: 79  Temp: 98.2 F (36.8 C)  Resp: 16    Neurovascular intact Incision: dressing C/D/I  LABS  Recent Labs  01/20/14 0421  HGB 9.0*  HCT 27.6*  WBC 8.3  PLT 170     Recent Labs  01/20/14 0421  NA 138  K 5.1  BUN 25*  CREATININE 1.25*  GLUCOSE 135*    No results found for this basename: LABPT, INR,  in the last 72 hours   Assessment/Plan: 1 Day Post-Op Procedure(s) (LRB): LEFT TOTAL KNEE ARTHROPLASTY (Left)   Advance diet Up with therapy D/C IV fluids Discharge to SNF when possible based on insurance type  Reviewed goals

## 2014-01-20 NOTE — Progress Notes (Signed)
Clinical Social Work Department CLINICAL SOCIAL WORK PLACEMENT NOTE 01/20/2014  Patient:  Nancy Suarez,Nancy Suarez  Account Number:  [id number] Admit date:  01/19/2014  Clinical Social Worker:  Unk LightningHOLLY Kylo Gavin, LCSW  Date/time:  01/20/2014 03:30 PM  Clinical Social Work is seeking post-discharge placement for this patient at the following level of care:   SKILLED NURSING   (*CSW will update this form in Epic as items are completed)   01/20/2014  [facility]/family provided with [facility] of Clinical Social Work's list of facilities offering this level of care within the geographic area requested by the patient (or if unable, by the patient's family).  01/20/2014  Patient/family informed of their freedom to choose among providers that offer the needed level of care, that participate in Medicare, Medicaid or managed care program needed by the patient, have an available bed and are willing to accept the patient.  01/20/2014  Patient/family informed of [facility]' ownership interest in [facility], as well as of the fact that they are under no obligation to receive care at this facility.  PASARR submitted to EDS on 01/20/2014 PASARR number received on 01/20/2014  FL2 transmitted to all facilities in geographic area requested by pt/family on  01/20/2014 FL2 transmitted to all facilities within larger geographic area on   Patient informed that his/her managed care company has contracts with or will negotiate with  certain facilities, including the following:     Patient/family informed of bed offers received:  01/20/2014 [facility] Physician recommends and patient chooses bed at    Patient to be transferred to  on   Patient to be transferred to facility by  Patient and family notified of transfer on  Name of family member notified:    The following physician request were entered in Epic:   Additional Comments:

## 2014-01-20 NOTE — Evaluation (Signed)
Physical Therapy Evaluation Patient Details Name: Nancy MinorRoxana R Suarez MRN: [id number] DOB: 08/01/1931 Today's Date: 01/20/2014   History of Present Illness  L TKA  Clinical Impression  *Pt is s/p TKA resulting in the deficits listed below (see PT Problem List). ** Pt will benefit from skilled PT to increase their independence and safety with mobility to allow discharge to the venue listed below.  **    Follow Up Recommendations SNF    Equipment Recommendations  None recommended by PT    Recommendations for Other Services OT consult     Precautions / Restrictions Precautions Precautions: Fall Precaution Comments: fell 7x in last 2 months Restrictions Weight Bearing Restrictions: No      Mobility  Bed Mobility Overal bed mobility: Needs Assistance Bed Mobility: Sit to Supine     Supine to sit: Min assist Sit to supine: Min assist   General bed mobility comments: to support LLE  Transfers Overall transfer level: Needs assistance Equipment used: Rolling walker (2 wheeled) Transfers: Sit to/from Stand Sit to Stand: Min assist         General transfer comment: cues for hand placement and min A to rise from recliner  Ambulation/Gait Ambulation/Gait assistance: Min assist Ambulation Distance (Feet): 45 Feet Assistive device: Rolling walker (2 wheeled) Gait Pattern/deviations: Wide base of support;Step-to pattern;Decreased step length - left   Gait velocity interpretation: Below normal speed for age/gender General Gait Details: min A balance, cues for positioning and sequencing with RW; distance limited by fatigue, pt has B flat feet (doesn't wear orthotics)  Stairs            Wheelchair Mobility    Modified Rankin (Stroke Patients Only)       Balance                                             Pertinent Vitals/Pain *4/10 L knee Premedicated, ice applied**    Home Living Family/patient expects to be discharged to:: Skilled nursing  facility Living Arrangements: Spouse/significant other             Home Equipment: Walker - 4 wheels;Cane - single point;Grab bars - tub/shower Additional Comments: husband had a stroke 8 yrs ago    Prior Function Level of Independence: Independent with assistive device(s)         Comments: used rollator and SPC, independent bathing and dressing     Hand Dominance        Extremity/Trunk Assessment               Lower Extremity Assessment: LLE deficits/detail   LLE Deficits / Details: knee flexion AAROM 50*  Cervical / Trunk Assessment: Other exceptions  Communication   Communication: No difficulties  Cognition Arousal/Alertness: Awake/alert Behavior During Therapy: WFL for tasks assessed/performed Overall Cognitive Status: Within Functional Limits for tasks assessed                      General Comments      Exercises Total Joint Exercises Ankle Circles/Pumps: AROM;Both;10 reps Quad Sets: AROM;Both;10 reps Heel Slides: AAROM;Left;10 reps;Seated Hip ABduction/ADduction: AAROM;Left;10 reps;Supine       Assessment/Plan    PT Assessment Patient needs continued PT services  PT Diagnosis Difficulty walking;Acute pain   PT Problem List Decreased strength;Decreased range of motion;Decreased activity tolerance;Decreased mobility;Decreased knowledge of use of DME;Pain  PT Treatment Interventions  DME instruction;Gait training;Functional mobility training;Therapeutic activities;Therapeutic exercise;Patient/family education   PT Goals (Current goals can be found in the Care Plan section) Acute Rehab PT Goals Patient Stated Goal:  (to be able to play with grandaughter) PT Goal Formulation: With patient Time For Goal Achievement: 01/27/14 Potential to Achieve Goals: Good    Frequency 7X/week   Barriers to discharge        Co-evaluation               End of Session Equipment Utilized During Treatment: Gait belt Activity Tolerance: Patient  limited by pain Patient left: in bed;with call bell/phone within reach Nurse Communication: Mobility status         Time: 1610-9604 PT Time Calculation (min): 29 min   Charges:     PT Treatments $Gait Training: 8-22 mins $Therapeutic Exercise: 8-22 mins   PT G Codes:          Tamala Ser 01/20/2014, 12:16 PM [phone]

## 2014-01-21 DIAGNOSIS — D62 Acute posthemorrhagic anemia: Secondary | ICD-10-CM

## 2014-01-21 DIAGNOSIS — E663 Overweight: Secondary | ICD-10-CM | POA: Diagnosis present

## 2014-01-21 LAB — CBC
HEMATOCRIT: 27.2 % — AB (ref 36.0–46.0)
Hemoglobin: 9.2 g/dL — ABNORMAL LOW (ref 12.0–15.0)
MCH: 30.5 pg (ref 26.0–34.0)
MCHC: 33.8 g/dL (ref 30.0–36.0)
MCV: 90.1 fL (ref 78.0–100.0)
Platelets: 174 10*3/uL (ref 150–400)
RBC: 3.02 MIL/uL — AB (ref 3.87–5.11)
RDW: 12.7 % (ref 11.5–15.5)
WBC: 9.7 10*3/uL (ref 4.0–10.5)

## 2014-01-21 LAB — BASIC METABOLIC PANEL
Anion gap: 10 (ref 5–15)
BUN: 22 mg/dL (ref 6–23)
CHLORIDE: 103 meq/L (ref 96–112)
CO2: 26 meq/L (ref 19–32)
Calcium: 9.6 mg/dL (ref 8.4–10.5)
Creatinine, Ser: 1 mg/dL (ref 0.50–1.10)
GFR calc Af Amer: 60 mL/min — ABNORMAL LOW (ref >90)
GFR calc non Af Amer: 52 mL/min — ABNORMAL LOW (ref >90)
Glucose, Bld: 140 mg/dL — ABNORMAL HIGH (ref 70–99)
Potassium: 4.9 mEq/L (ref 3.7–5.3)
Sodium: 139 mEq/L (ref 137–147)

## 2014-01-21 MED ORDER — FERROUS SULFATE 325 (65 FE) MG PO TABS: 325.0000 mg | ORAL_TABLET | Freq: Three times a day (TID) | ORAL | Status: DC

## 2014-01-21 MED ORDER — DSS 100 MG PO CAPS
100.0000 mg | ORAL_CAPSULE | Freq: Two times a day (BID) | ORAL | Status: DC
Start: 2014-01-21 — End: 2021-04-14

## 2014-01-21 MED ORDER — POLYETHYLENE GLYCOL 3350 17 G PO PACK: 17.0000 g | PACK | Freq: Two times a day (BID) | ORAL | Status: DC

## 2014-01-21 MED ORDER — OXYCODONE HCL 5 MG PO TABS: 5.0000 mg | ORAL_TABLET | ORAL | Status: DC | PRN

## 2014-01-21 MED ORDER — RIVAROXABAN 10 MG PO TABS: 10.0000 mg | ORAL_TABLET | ORAL | Status: DC

## 2014-01-21 MED ORDER — ASPIRIN 81 MG PO CHEW: 81.0000 mg | CHEWABLE_TABLET | ORAL | Status: DC

## 2014-01-21 MED ORDER — TIZANIDINE HCL 4 MG PO TABS: 4.0000 mg | ORAL_TABLET | Freq: Four times a day (QID) | ORAL | Status: DC | PRN

## 2014-01-21 NOTE — Progress Notes (Signed)
   Subjective: 2 Days Post-Op Procedure(s) (LRB): LEFT TOTAL KNEE ARTHROPLASTY (Left)   Patient reports pain as mild, pain controlled. Little more pain than yesterday. No events throughout the night. Working well with PT. Ready to be discharged to skilled nursing facility if she does well with PT and pain stays controlled.  Objective:   VITALS:   Filed Vitals:   01/21/14 0550  BP: 147/62  Pulse: 80  Temp: 98.6 F (37 C)  Resp: 18    Neurovascular intact Dorsiflexion/Plantar flexion intact Incision: dressing C/D/I No cellulitis present Compartment soft  LABS  Recent Labs  01/20/14 0421 01/21/14 0501  HGB 9.0* 9.2*  HCT 27.6* 27.2*  WBC 8.3 9.7  PLT 170 174     Recent Labs  01/20/14 0421 01/21/14 0501  NA 138 139  K 5.1 4.9  BUN 25* 22  CREATININE 1.25* 1.00  GLUCOSE 135* 140*     Assessment/Plan: 2 Days Post-Op Procedure(s) (LRB): LEFT TOTAL KNEE ARTHROPLASTY (Left) Up with therapy Discharge to SNF [facility]. Follow up with OLIN,Khamila Bassinger D in 2 weeks.  Contact information:  [facility] [address], [address] [place] [id number] [phone]    Expected ABLA  Treated with iron and will observe  Overweight (BMI 25-29.9) Estimated body mass index is 26.25 kg/(m^2) as calculated from the following:   Height as of this encounter: 5\' 4"  (1.626 m).   Weight as of this encounter: 69.4 kg (153 lb). Patient also counseled that weight may inhibit the healing process Patient counseled that losing weight will help with future health issues         Anastasio AuerbachMatthew S. Herchel Hopkin   PAC  01/21/2014, 9:14 AM

## 2014-01-21 NOTE — Discharge Summary (Signed)
Physician Discharge Summary  Patient ID: Nancy Suarez MRN: [id number] DOB/AGE: 1932-06-12 80 y.o.  Admit date: 01/19/2014 Discharge date:  01/21/2014  Procedures:  Procedure(s) (LRB): LEFT TOTAL KNEE ARTHROPLASTY (Left)  Attending Physician:  Dr. Durene Romans   Admission Diagnoses:   Left knee OA / pain  Discharge Diagnoses:  Principal Problem:   S/P left TKA Active Problems:   Overweight (BMI 25.0-29.9)   Postoperative anemia due to acute blood loss  Past Medical History  Diagnosis Date  . Scoliosis   . Osteopenia   . Chronic insomnia   . Atrophic vaginitis   . Spinal arthritis   . GERD (gastroesophageal reflux disease)   . ASCUS (atypical squamous cells of undetermined significance) on Pap smear   . Ovarian cyst     "shrank it"  . LGSIL (low grade squamous intraepithelial dysplasia)   . Hypertension   . Hypercholesteremia   . Goiter   . Depression   . History of kidney stones   . Pancreatitis     HPI: Nancy Suarez, 80 y.o. female, has a history of pain and functional disability in the left knee due to arthritis and has failed non-surgical conservative treatments for greater than 12 weeks to includeNSAID's and/or analgesics, corticosteriod injections, viscosupplementation injections, use of assistive devices and activity modification. Onset of symptoms was gradual, starting >10 years ago with gradually worsening course since that time. The patient noted no past surgery on the left knee(s). Patient currently rates pain in the left knee(s) at 10 out of 10 with activity. Patient has night pain, worsening of pain with activity and weight bearing, pain that interferes with activities of daily living, pain with passive range of motion, crepitus and joint swelling. Patient has evidence of periarticular osteophytes and joint space narrowing by imaging studies. There is no active infection. Risks, benefits and expectations were discussed with the patient. Risks including but  not limited to the risk of anesthesia, blood clots, nerve damage, blood vessel damage, failure of the prosthesis, infection and up to and including death. Patient understand the risks, benefits and expectations and wishes to proceed with surgery.  PCP: Pcp Not In System   Discharged Condition: good  Hospital Course:  Patient underwent the above stated procedure on 01/19/2014. Patient tolerated the procedure well and brought to the recovery room in good condition and subsequently to the floor.  POD #1 BP: 138/46 ; Pulse: 79 ; Temp: 98.2 F (36.8 C) ; Resp: 16 Patient reports pain as mild to moderate. Happy to have this behind her but questions when she could get her other knee done. Dorsiflexion/plantar flexion intact, incision: dressing C/D/I, no cellulitis present and compartment soft.   LABS  Basename    HGB  9.0  HCT  27.6   POD #2  BP: 147/62 ; Pulse: 80 ; Temp: 98.6 F (37 C) ; Resp: 18  Patient reports pain as mild, pain controlled. Little more pain than yesterday. No events throughout the night. Working well with PT. Ready to be discharged to skilled nursing facility.  Dorsiflexion/plantar flexion intact, incision: dressing C/D/I, no cellulitis present and compartment soft.   LABS  Basename    HGB  9.2  HCT  27.2    Discharge Exam: General appearance: alert, cooperative and no distress Extremities: Homans sign is negative, no sign of DVT, no edema, redness or tenderness in the calves or thighs and no ulcers, gangrene or trophic changes  Disposition:  Skilled nursing facility with follow up in  2 weeks   Follow-up Information   Follow up with Shelda Pal, MD. Schedule an appointment as soon as possible for a visit in 2 weeks.   Specialty:  Orthopedic Surgery   Contact information:   [address] [place] [address] [phone]       Discharge Instructions   Call MD / Call 911    Complete by:  As directed   If you experience chest pain or  shortness of breath, CALL 911 and be transported to the hospital emergency room.  If you develope a fever above 101 F, pus (white drainage) or increased drainage or redness at the wound, or calf pain, call your surgeon's office.     Change dressing    Complete by:  As directed   Maintain surgical dressing for 10-14 days, or until follow up in the clinic.     Constipation Prevention    Complete by:  As directed   Drink plenty of fluids.  Prune juice may be helpful.  You may use a stool softener, such as Colace (over the counter) 100 mg twice a day.  Use MiraLax (over the counter) for constipation as needed.     Diet - low sodium heart healthy    Complete by:  As directed      Discharge instructions    Complete by:  As directed   Maintain surgical dressing for 10-14 days, or until follow up in the clinic. Follow up in 2 weeks at [facility]. Call with any questions or concerns.     Increase activity slowly as tolerated    Complete by:  As directed      TED hose    Complete by:  As directed   Use stockings (TED hose) for 2 weeks on both leg(s).  You may remove them at night for sleeping.     Weight bearing as tolerated    Complete by:  As directed   Laterality:  left  Extremity:  Lower             Medication List    STOP taking these medications       oxyCODONE-acetaminophen 7.5-325 MG per tablet  Commonly known as:  PERCOCET      TAKE these medications       aspirin 81 MG chewable tablet  Chew 1 tablet (81 mg total) by mouth every other day.     atorvastatin 10 MG tablet  Commonly known as:  LIPITOR  Take 10 mg by mouth daily.     calcium-vitamin D 500-200 MG-UNIT per tablet  Commonly known as:  OSCAL WITH D  Take 1 tablet by mouth 2 (two) times daily.     carboxymethylcellulose 0.5 % Soln  Commonly known as:  REFRESH PLUS  Place 1 drop into both eyes as needed (Dry eyes).     cetirizine 10 MG tablet  Commonly known as:  ZYRTEC  Take 10 mg by mouth as  needed for allergies.     colesevelam 625 MG tablet  Commonly known as:  WELCHOL  Take 625 mg by mouth every Monday, Wednesday, and Friday.     DSS 100 MG Caps  Take 100 mg by mouth 2 (two) times daily.     ferrous sulfate 325 (65 FE) MG tablet  Take 1 tablet (325 mg total) by mouth 3 (three) times daily after meals.     mirtazapine 15 MG tablet  Commonly known as:  REMERON  Take 15 mg by  mouth at bedtime.     multivitamin with minerals Tabs tablet  Take 1 tablet by mouth daily.     olmesartan-hydrochlorothiazide 40-25 MG per tablet  Commonly known as:  BENICAR HCT  Take 1 tablet by mouth every morning.     omeprazole 20 MG capsule  Commonly known as:  PRILOSEC  Take 20 mg by mouth daily.     oxyCODONE 5 MG immediate release tablet  Commonly known as:  Oxy IR/ROXICODONE  Take 1-3 tablets (5-15 mg total) by mouth every 4 (four) hours as needed for severe pain.     polyethylene glycol packet  Commonly known as:  MIRALAX / GLYCOLAX  Take 17 g by mouth 2 (two) times daily.     rivaroxaban 10 MG Tabs tablet  Commonly known as:  XARELTO  Take 1 tablet (10 mg total) by mouth daily.     tiZANidine 4 MG tablet  Commonly known as:  ZANAFLEX  Take 1 tablet (4 mg total) by mouth every 6 (six) hours as needed.     zolpidem 10 MG tablet  Commonly known as:  AMBIEN  Take 10 mg by mouth at bedtime.         Signed: Anastasio AuerbachMatthew S. Anacarolina Evelyn   PA-C  01/21/2014, 9:29 AM

## 2014-01-21 NOTE — Progress Notes (Signed)
Clinical Social Work Department CLINICAL SOCIAL WORK PLACEMENT NOTE 01/21/2014  Patient:  Nancy Suarez,Nancy Suarez  Account Number:  [id number] Admit date:  01/19/2014  Clinical Social Worker:  Unk LightningHOLLY GERBER, LCSW  Date/time:  01/20/2014 03:30 PM  Clinical Social Work is seeking post-discharge placement for this patient at the following level of care:   SKILLED NURSING   (*CSW will update this form in Epic as items are completed)   01/20/2014  Patient/family provided with [facility] Department of Clinical Social Work's list of facilities offering this level of care within the geographic area requested by the patient (or if unable, by the patient's family).  01/20/2014  Patient/family informed of their freedom to choose among providers that offer the needed level of care, that participate in Medicare, Medicaid or managed care program needed by the patient, have an available bed and are willing to accept the patient.  01/20/2014  Patient/family informed of [facility]' ownership interest in [facility], as well as of the fact that they are under no obligation to receive care at this facility.  PASARR submitted to EDS on 01/20/2014 PASARR number received on 01/20/2014  FL2 transmitted to all facilities in geographic area requested by pt/family on  01/20/2014 FL2 transmitted to all facilities within larger geographic area on   Patient informed that his/her managed care company has contracts with or will negotiate with  certain facilities, including the following:     Patient/family informed of bed offers received:  01/20/2014 [facility] Physician recommends and patient chooses bed at    [facility] on  01/21/2014 Patient to be transferred to facility by [facility] Patient and family notified of transfer on 01/21/2014 Name of family member notified:  Daughter  The following physician request were entered in  Epic:   Additional Comments: Pt / daughter are in agreement with d/c plan to SNF today via [facility]. [employer] provided prior authorization. NSG reviewed d/c summary, avs scripts. Scripts included in d/c packet.  Cori RazorJamie Kwane Rohl LCSW [phone]

## 2014-01-21 NOTE — Progress Notes (Signed)
Physical Therapy Treatment Patient Details Name: Nancy Suarez MRN: [id number] DOB: [birth year] Today's Date: 01/21/2014    History of Present Illness L TKA    PT Comments    POD # 2 am session.  Assisted pt OOB with increased time and Min Assist to support L LE.  Amb distance decreased this session due to increased c/o L knee pain "more sore today" and increased c/o fatigue.  Pt demon poor balance and has physical deformities of scoliosis and B flat as well as RA in R knee.  Very unsteady gait.  HIGH FALL RISK.  Pt will need ST Rehab at SNF prior to D/C to home.  Follow Up Recommendations  SNF     Equipment Recommendations       Recommendations for Other Services       Precautions / Restrictions Precautions Precautions: Fall;Knee Precaution Comments: fell 7x in last 2 months Restrictions Weight Bearing Restrictions: No Other Position/Activity Restrictions: WBAT    Mobility  Bed Mobility Overal bed mobility: Needs Assistance Bed Mobility: Supine to Sit;Sit to Supine     Supine to sit: Min assist     General bed mobility comments: support L LE off and on bed plus increased time to scoot and 25% VC's on proper tech  Transfers Overall transfer level: Needs assistance Equipment used: Rolling walker (2 wheeled) Transfers: Sit to/from Stand Sit to Stand: Min assist         General transfer comment: 25% VC's on proper hand placement with both sit to stand and stand to sit.  50% VC's on turn completion prior to sit as well as hand placement as pt had urgency to sit due to fatigue and pain level.  Ambulation/Gait Ambulation/Gait assistance: Min assist Ambulation Distance (Feet): 16 Feet Assistive device: Rolling walker (2 wheeled) Gait Pattern/deviations: Step-to pattern;Trunk flexed Gait velocity: decreased   General Gait Details: decreased amb distance this am due to increased c/o fatigue and pain level 8/10.  "More sore today". Pt unsteady with poor posture.  Hx  scoliosis and B flat foot.  Pt also c/o other knee pain which also has RA.  HIGH FALL RISK.   Stairs            Wheelchair Mobility    Modified Rankin (Stroke Patients Only)       Balance                                    Cognition                            Exercises   B LE AP x 20 reps B LE knee presses 10 reps Unable to tolerate further TE's Applied ICE Requested pain meds    General Comments        Pertinent Vitals/Pain C/o 8/10 L knee pain                                                                             (see above)  Home Living  Prior Function            PT Goals (current goals can now be found in the care plan section) Progress towards PT goals: Progressing toward goals    Frequency  7X/week    PT Plan      Co-evaluation             End of Session Equipment Utilized During Treatment: Gait belt Activity Tolerance: Patient limited by pain Patient left: in bed;with call bell/phone within reach     Time: 0755-0820 PT Time Calculation (min): 25 min  Charges:  $Gait Training: 8-22 mins $Therapeutic Activity: 8-22 mins                    G Codes:      Felecia ShellingLori Earsel Shouse  PTA [facility]  Acute  Rehab Pager      [phone]

## 2014-05-04 ENCOUNTER — Encounter (HOSPITAL_COMMUNITY): Payer: Self-pay | Admitting: Orthopedic Surgery

## 2014-06-17 ENCOUNTER — Emergency Department (HOSPITAL_BASED_OUTPATIENT_CLINIC_OR_DEPARTMENT_OTHER): Payer: Medicare Other

## 2014-06-17 ENCOUNTER — Emergency Department (HOSPITAL_BASED_OUTPATIENT_CLINIC_OR_DEPARTMENT_OTHER)
Admission: EM | Admit: 2014-06-17 | Discharge: 2014-06-17 | Disposition: A | Discharge status: Home / Self Care | Payer: Medicare Other | Attending: Emergency Medicine | Admitting: Emergency Medicine

## 2014-06-17 ENCOUNTER — Encounter (HOSPITAL_BASED_OUTPATIENT_CLINIC_OR_DEPARTMENT_OTHER): Payer: Self-pay | Admitting: Emergency Medicine

## 2014-06-17 DIAGNOSIS — Y998 Other external cause status: Secondary | ICD-10-CM | POA: Diagnosis not present

## 2014-06-17 DIAGNOSIS — K219 Gastro-esophageal reflux disease without esophagitis: Secondary | ICD-10-CM | POA: Diagnosis not present

## 2014-06-17 DIAGNOSIS — S52602A Unspecified fracture of lower end of left ulna, initial encounter for closed fracture: Secondary | ICD-10-CM | POA: Diagnosis not present

## 2014-06-17 DIAGNOSIS — Z7901 Long term (current) use of anticoagulants: Secondary | ICD-10-CM | POA: Insufficient documentation

## 2014-06-17 DIAGNOSIS — Z8742 Personal history of other diseases of the female genital tract: Secondary | ICD-10-CM | POA: Diagnosis not present

## 2014-06-17 DIAGNOSIS — Z87442 Personal history of urinary calculi: Secondary | ICD-10-CM | POA: Diagnosis not present

## 2014-06-17 DIAGNOSIS — Y9289 Other specified places as the place of occurrence of the external cause: Secondary | ICD-10-CM | POA: Insufficient documentation

## 2014-06-17 DIAGNOSIS — S52502A Unspecified fracture of the lower end of left radius, initial encounter for closed fracture: Secondary | ICD-10-CM

## 2014-06-17 DIAGNOSIS — Z87891 Personal history of nicotine dependence: Secondary | ICD-10-CM | POA: Diagnosis not present

## 2014-06-17 DIAGNOSIS — Y9389 Activity, other specified: Secondary | ICD-10-CM | POA: Insufficient documentation

## 2014-06-17 DIAGNOSIS — F329 Major depressive disorder, single episode, unspecified: Secondary | ICD-10-CM | POA: Insufficient documentation

## 2014-06-17 DIAGNOSIS — G4700 Insomnia, unspecified: Secondary | ICD-10-CM | POA: Insufficient documentation

## 2014-06-17 DIAGNOSIS — R52 Pain, unspecified: Secondary | ICD-10-CM

## 2014-06-17 DIAGNOSIS — W1830XA Fall on same level, unspecified, initial encounter: Secondary | ICD-10-CM | POA: Diagnosis not present

## 2014-06-17 DIAGNOSIS — I10 Essential (primary) hypertension: Secondary | ICD-10-CM | POA: Insufficient documentation

## 2014-06-17 DIAGNOSIS — Z7982 Long term (current) use of aspirin: Secondary | ICD-10-CM | POA: Insufficient documentation

## 2014-06-17 DIAGNOSIS — M4690 Unspecified inflammatory spondylopathy, site unspecified: Secondary | ICD-10-CM | POA: Diagnosis not present

## 2014-06-17 DIAGNOSIS — S6992XA Unspecified injury of left wrist, hand and finger(s), initial encounter: Secondary | ICD-10-CM | POA: Diagnosis present

## 2014-06-17 DIAGNOSIS — E780 Pure hypercholesterolemia: Secondary | ICD-10-CM | POA: Insufficient documentation

## 2014-06-17 DIAGNOSIS — Z79899 Other long term (current) drug therapy: Secondary | ICD-10-CM | POA: Insufficient documentation

## 2014-06-17 MED ORDER — OXYCODONE-ACETAMINOPHEN 5-325 MG PO TABS
1.0000 | ORAL_TABLET | Freq: Once | ORAL | Status: AC
  Administered 2014-06-17: 1 via ORAL
  Filled 2014-06-17: qty 1

## 2014-06-17 MED ORDER — OXYCODONE-ACETAMINOPHEN 5-325 MG PO TABS
1.0000 | ORAL_TABLET | ORAL | Status: DC | PRN
Start: 2014-06-17 — End: 2017-06-12

## 2014-06-17 NOTE — ED Notes (Signed)
Patient states that she got up and fell onto her left wrist. The patient has noted swelling to her left wrist

## 2014-06-17 NOTE — ED Provider Notes (Signed)
CSN: [id number]     Arrival date & time 06/17/14  0223 History   First MD Initiated Contact with Patient 06/17/14 0319     Chief Complaint  Patient presents with  . Wrist Injury     (Consider location/radiation/quality/duration/timing/severity/associated sxs/prior Treatment) HPI This is an 80 year old female who fell this morning onto her outstretched left hand. She now has pain, swelling, ecchymosis and deformity of the left wrist with decreased range of motion of the left wrist. Pain is moderate to severe, worse with palpation or movement. There is no distal numbness or functional deficit. She denies other injury.  Past Medical History  Diagnosis Date  . Scoliosis   . Osteopenia   . Chronic insomnia   . Atrophic vaginitis   . Spinal arthritis   . GERD (gastroesophageal reflux disease)   . ASCUS (atypical squamous cells of undetermined significance) on Pap smear   . Ovarian cyst     "shrank it"  . LGSIL (low grade squamous intraepithelial dysplasia)   . Hypertension   . Hypercholesteremia   . Goiter   . Depression   . History of kidney stones   . Pancreatitis    Past Surgical History  Procedure Laterality Date  . Kidney stone surgery  1990's    2-3 stones x1 surgery  . Wisdom tooth extraction    . Tonsillectomy  as teenager  . Abdominal hysterectomy  1977    partial  . Total knee arthroplasty Left 01/19/2014    Procedure: LEFT TOTAL KNEE ARTHROPLASTY;  Surgeon: Shelda PalMatthew D Olin, MD;  Location: [facility];  Service: Orthopedics;  Laterality: Left;   Family History  Problem Relation Age of Onset  . Hypertension Father    History  Substance Use Topics  . Smoking status: Former Smoker -- 20 years    Types: Cigarettes    Quit date: 07/03/1978  . Smokeless tobacco: Never Used  . Alcohol Use: Yes     Comment: social   OB History    Gravida Para Term Preterm AB TAB SAB Ectopic Multiple Living   1 1        1      Review of Systems  All other systems reviewed and are  negative.   Allergies  Aspirin; Codeine; and Sulfa antibiotics  Home Medications   Prior to Admission medications   Medication Sig Start Date End Date Taking? Authorizing Provider  aspirin 81 MG chewable tablet Chew 1 tablet (81 mg total) by mouth every other day. 01/21/14   Genelle GatherMatthew Scott Babish, PA-C  atorvastatin (LIPITOR) 10 MG tablet Take 10 mg by mouth daily.    Historical Provider, MD  calcium-vitamin D (OSCAL WITH D) 500-200 MG-UNIT per tablet Take 1 tablet by mouth 2 (two) times daily.    Historical Provider, MD  carboxymethylcellulose (REFRESH PLUS) 0.5 % SOLN Place 1 drop into both eyes as needed (Dry eyes).    Historical Provider, MD  cetirizine (ZYRTEC) 10 MG tablet Take 10 mg by mouth as needed for allergies.    Historical Provider, MD  colesevelam [facility](WELCHOL) 625 MG tablet Take 625 mg by mouth every Monday, Wednesday, and Friday.    Historical Provider, MD  docusate sodium 100 MG CAPS Take 100 mg by mouth 2 (two) times daily. 01/21/14   Genelle GatherMatthew Scott Babish, PA-C  ferrous sulfate 325 (65 FE) MG tablet Take 1 tablet (325 mg total) by mouth 3 (three) times daily after meals. 01/21/14   Genelle GatherMatthew Scott Babish, PA-C  mirtazapine (REMERON) 15 MG tablet  Take 15 mg by mouth at bedtime.    Historical Provider, MD  Multiple Vitamin (MULTIVITAMIN WITH MINERALS) TABS tablet Take 1 tablet by mouth daily.    Historical Provider, MD  olmesartan-hydrochlorothiazide (BENICAR HCT) 40-25 MG per tablet Take 1 tablet by mouth every morning.    Historical Provider, MD  omeprazole (PRILOSEC) 20 MG capsule Take 20 mg by mouth daily.    Historical Provider, MD  oxyCODONE (OXY IR/ROXICODONE) 5 MG immediate release tablet Take 1-3 tablets (5-15 mg total) by mouth every 4 (four) hours as needed for severe pain. 01/21/14   Genelle GatherMatthew Scott Babish, PA-C  polyethylene glycol [facility](MIRALAX / GLYCOLAX) packet Take 17 g by mouth 2 (two) times daily. 01/21/14   Genelle GatherMatthew Scott Babish, PA-C  rivaroxaban (XARELTO) 10 MG TABS tablet  Take 1 tablet (10 mg total) by mouth daily. 01/21/14   Genelle GatherMatthew Scott Babish, PA-C  tiZANidine (ZANAFLEX) 4 MG tablet Take 1 tablet (4 mg total) by mouth every 6 (six) hours as needed. 01/21/14   Genelle GatherMatthew Scott Babish, PA-C  zolpidem (AMBIEN) 10 MG tablet Take 10 mg by mouth at bedtime.    Historical Provider, MD   BP 136/63 mmHg  Pulse 84  Temp(Src) 98 F (36.7 C) (Oral)  Resp 16  Ht 5\' 4"  (1.626 m)  Wt 150 lb (68.04 kg)  BMI 25.73 kg/m2  SpO2 98%   Physical Exam  General: Well-developed, well-nourished female in no acute distress; appearance consistent with age of record HENT: normocephalic; atraumatic Eyes: pupils equal, round and reactive to light; extraocular muscles intact; arcus senilis bilaterally; cataracts bilaterally Neck: supple  Heart: regular rate and rhythm Lungs: clear to auscultation bilaterally Abdomen: soft; nondistended; nontender Extremities: Arthritic changes; deformity, tenderness, swelling and ecchymosis of the left wrist with decreased range of motion and pain on attempted range of motion; left hand distally neurovascularly intact Neurologic: Awake, alert and oriented; motor function intact in all extremities and symmetric; no facial droop Skin: Warm and dry Psychiatric: Normal mood and affect    ED Course  Procedures (including critical care time)   MDM  Nursing notes and vitals signs, including pulse oximetry, reviewed.  Summary of this visit's results, reviewed by myself:  Imaging Studies: Dg Wrist Complete Left  06/17/2014   CLINICAL DATA:  Fall with wrist injury.  Initial encounter  EXAM: LEFT WRIST - COMPLETE 3+ VIEW  COMPARISON:  None.  FINDINGS: There is a transverse extra-articular fracture through the distal radius with posterior displacement and impaction. The wrist is dorsally tilted.  Ulnar styloid process avulsion fracture with radial displacement.  Mild widening of the scapholunate interval suggesting dissociation.  Osteopenia.  Advanced  first CMC osteoarthritis.  IMPRESSION: 1. Displaced extra-articular distal radius fracture. 2. Ulnar styloid avulsion fracture. 3. Possible scapholunate dissociation.   Electronically Signed   By: Tiburcio PeaJonathan  Watts M.D.   On: 06/17/2014 04:34   4:55 AM Discussed with Dr. Amanda PeaGramig of Hand Surgery. He will see the patient in his office at 7:30 this morning.  Hanley SeamenJohn L Linh Hedberg, MD 06/17/14 [phone]

## 2015-09-06 DIAGNOSIS — I119 Hypertensive heart disease without heart failure: Secondary | ICD-10-CM | POA: Diagnosis not present

## 2015-09-06 DIAGNOSIS — Z79899 Other long term (current) drug therapy: Secondary | ICD-10-CM | POA: Diagnosis not present

## 2015-09-06 DIAGNOSIS — G608 Other hereditary and idiopathic neuropathies: Secondary | ICD-10-CM | POA: Diagnosis not present

## 2015-09-06 DIAGNOSIS — Z6827 Body mass index (BMI) 27.0-27.9, adult: Secondary | ICD-10-CM | POA: Diagnosis not present

## 2015-09-06 DIAGNOSIS — E7800 Pure hypercholesterolemia, unspecified: Secondary | ICD-10-CM | POA: Diagnosis not present

## 2015-09-06 DIAGNOSIS — Q820 Hereditary lymphedema: Secondary | ICD-10-CM | POA: Diagnosis not present

## 2015-09-06 DIAGNOSIS — N183 Chronic kidney disease, stage 3 (moderate): Secondary | ICD-10-CM | POA: Diagnosis not present

## 2015-09-06 DIAGNOSIS — Z79891 Long term (current) use of opiate analgesic: Secondary | ICD-10-CM | POA: Diagnosis not present

## 2015-09-06 DIAGNOSIS — M2550 Pain in unspecified joint: Secondary | ICD-10-CM | POA: Diagnosis not present

## 2015-09-06 DIAGNOSIS — M1990 Unspecified osteoarthritis, unspecified site: Secondary | ICD-10-CM | POA: Diagnosis not present

## 2015-11-01 DIAGNOSIS — M2041 Other hammer toe(s) (acquired), right foot: Secondary | ICD-10-CM | POA: Diagnosis not present

## 2015-11-01 DIAGNOSIS — L602 Onychogryphosis: Secondary | ICD-10-CM | POA: Diagnosis not present

## 2015-11-01 DIAGNOSIS — L84 Corns and callosities: Secondary | ICD-10-CM | POA: Diagnosis not present

## 2015-11-01 DIAGNOSIS — M2042 Other hammer toe(s) (acquired), left foot: Secondary | ICD-10-CM | POA: Diagnosis not present

## 2015-11-01 DIAGNOSIS — I739 Peripheral vascular disease, unspecified: Secondary | ICD-10-CM | POA: Diagnosis not present

## 2015-12-13 DIAGNOSIS — I119 Hypertensive heart disease without heart failure: Secondary | ICD-10-CM | POA: Diagnosis not present

## 2015-12-13 DIAGNOSIS — E7800 Pure hypercholesterolemia, unspecified: Secondary | ICD-10-CM | POA: Diagnosis not present

## 2015-12-13 DIAGNOSIS — M1990 Unspecified osteoarthritis, unspecified site: Secondary | ICD-10-CM | POA: Diagnosis not present

## 2015-12-13 DIAGNOSIS — Z9181 History of falling: Secondary | ICD-10-CM | POA: Diagnosis not present

## 2015-12-13 DIAGNOSIS — Z79899 Other long term (current) drug therapy: Secondary | ICD-10-CM | POA: Diagnosis not present

## 2015-12-13 DIAGNOSIS — M2550 Pain in unspecified joint: Secondary | ICD-10-CM | POA: Diagnosis not present

## 2015-12-13 DIAGNOSIS — N183 Chronic kidney disease, stage 3 (moderate): Secondary | ICD-10-CM | POA: Diagnosis not present

## 2015-12-13 DIAGNOSIS — Z6828 Body mass index (BMI) 28.0-28.9, adult: Secondary | ICD-10-CM | POA: Diagnosis not present

## 2015-12-13 DIAGNOSIS — Z79891 Long term (current) use of opiate analgesic: Secondary | ICD-10-CM | POA: Diagnosis not present

## 2015-12-13 DIAGNOSIS — Q820 Hereditary lymphedema: Secondary | ICD-10-CM | POA: Diagnosis not present

## 2016-02-08 DIAGNOSIS — M5136 Other intervertebral disc degeneration, lumbar region: Secondary | ICD-10-CM | POA: Diagnosis not present

## 2016-03-07 DIAGNOSIS — M2550 Pain in unspecified joint: Secondary | ICD-10-CM | POA: Diagnosis not present

## 2016-03-07 DIAGNOSIS — G609 Hereditary and idiopathic neuropathy, unspecified: Secondary | ICD-10-CM | POA: Diagnosis not present

## 2016-03-07 DIAGNOSIS — N183 Chronic kidney disease, stage 3 (moderate): Secondary | ICD-10-CM | POA: Diagnosis not present

## 2016-03-07 DIAGNOSIS — E7800 Pure hypercholesterolemia, unspecified: Secondary | ICD-10-CM | POA: Diagnosis not present

## 2016-03-07 DIAGNOSIS — Q820 Hereditary lymphedema: Secondary | ICD-10-CM | POA: Diagnosis not present

## 2016-03-07 DIAGNOSIS — M159 Polyosteoarthritis, unspecified: Secondary | ICD-10-CM | POA: Diagnosis not present

## 2016-03-07 DIAGNOSIS — Z6828 Body mass index (BMI) 28.0-28.9, adult: Secondary | ICD-10-CM | POA: Diagnosis not present

## 2016-03-07 DIAGNOSIS — Z79891 Long term (current) use of opiate analgesic: Secondary | ICD-10-CM | POA: Diagnosis not present

## 2016-03-07 DIAGNOSIS — I119 Hypertensive heart disease without heart failure: Secondary | ICD-10-CM | POA: Diagnosis not present

## 2016-03-07 DIAGNOSIS — Z79899 Other long term (current) drug therapy: Secondary | ICD-10-CM | POA: Diagnosis not present

## 2016-06-05 DIAGNOSIS — E7800 Pure hypercholesterolemia, unspecified: Secondary | ICD-10-CM | POA: Diagnosis not present

## 2016-06-05 DIAGNOSIS — I119 Hypertensive heart disease without heart failure: Secondary | ICD-10-CM | POA: Diagnosis not present

## 2016-06-05 DIAGNOSIS — M1990 Unspecified osteoarthritis, unspecified site: Secondary | ICD-10-CM | POA: Diagnosis not present

## 2016-06-05 DIAGNOSIS — Z79899 Other long term (current) drug therapy: Secondary | ICD-10-CM | POA: Diagnosis not present

## 2016-06-05 DIAGNOSIS — M2550 Pain in unspecified joint: Secondary | ICD-10-CM | POA: Diagnosis not present

## 2016-06-05 DIAGNOSIS — Z6828 Body mass index (BMI) 28.0-28.9, adult: Secondary | ICD-10-CM | POA: Diagnosis not present

## 2016-06-05 DIAGNOSIS — Z79891 Long term (current) use of opiate analgesic: Secondary | ICD-10-CM | POA: Diagnosis not present

## 2016-06-05 DIAGNOSIS — G609 Hereditary and idiopathic neuropathy, unspecified: Secondary | ICD-10-CM | POA: Diagnosis not present

## 2016-06-05 DIAGNOSIS — G8929 Other chronic pain: Secondary | ICD-10-CM | POA: Diagnosis not present

## 2016-06-05 DIAGNOSIS — Q820 Hereditary lymphedema: Secondary | ICD-10-CM | POA: Diagnosis not present

## 2016-06-05 DIAGNOSIS — N183 Chronic kidney disease, stage 3 (moderate): Secondary | ICD-10-CM | POA: Diagnosis not present

## 2016-06-29 DIAGNOSIS — Z1231 Encounter for screening mammogram for malignant neoplasm of breast: Secondary | ICD-10-CM | POA: Diagnosis not present

## 2016-09-04 DIAGNOSIS — M2550 Pain in unspecified joint: Secondary | ICD-10-CM | POA: Diagnosis not present

## 2016-09-04 DIAGNOSIS — G8929 Other chronic pain: Secondary | ICD-10-CM | POA: Diagnosis not present

## 2016-09-04 DIAGNOSIS — Z0001 Encounter for general adult medical examination with abnormal findings: Secondary | ICD-10-CM | POA: Diagnosis not present

## 2016-09-04 DIAGNOSIS — Q820 Hereditary lymphedema: Secondary | ICD-10-CM | POA: Diagnosis not present

## 2016-09-04 DIAGNOSIS — I119 Hypertensive heart disease without heart failure: Secondary | ICD-10-CM | POA: Diagnosis not present

## 2016-10-09 DIAGNOSIS — K5904 Chronic idiopathic constipation: Secondary | ICD-10-CM | POA: Diagnosis not present

## 2016-10-09 DIAGNOSIS — R141 Gas pain: Secondary | ICD-10-CM | POA: Diagnosis not present

## 2016-11-08 DIAGNOSIS — M5136 Other intervertebral disc degeneration, lumbar region: Secondary | ICD-10-CM | POA: Diagnosis not present

## 2016-12-04 DIAGNOSIS — E7800 Pure hypercholesterolemia, unspecified: Secondary | ICD-10-CM | POA: Diagnosis not present

## 2016-12-04 DIAGNOSIS — Z79899 Other long term (current) drug therapy: Secondary | ICD-10-CM | POA: Diagnosis not present

## 2016-12-04 DIAGNOSIS — M2550 Pain in unspecified joint: Secondary | ICD-10-CM | POA: Diagnosis not present

## 2016-12-04 DIAGNOSIS — Q820 Hereditary lymphedema: Secondary | ICD-10-CM | POA: Diagnosis not present

## 2016-12-04 DIAGNOSIS — I119 Hypertensive heart disease without heart failure: Secondary | ICD-10-CM | POA: Diagnosis not present

## 2016-12-04 DIAGNOSIS — Z6828 Body mass index (BMI) 28.0-28.9, adult: Secondary | ICD-10-CM | POA: Diagnosis not present

## 2017-03-08 DIAGNOSIS — M2550 Pain in unspecified joint: Secondary | ICD-10-CM | POA: Diagnosis not present

## 2017-03-08 DIAGNOSIS — I119 Hypertensive heart disease without heart failure: Secondary | ICD-10-CM | POA: Diagnosis not present

## 2017-03-08 DIAGNOSIS — Q820 Hereditary lymphedema: Secondary | ICD-10-CM | POA: Diagnosis not present

## 2017-03-08 DIAGNOSIS — S4992XA Unspecified injury of left shoulder and upper arm, initial encounter: Secondary | ICD-10-CM | POA: Diagnosis not present

## 2017-06-05 ENCOUNTER — Other Ambulatory Visit: Payer: Self-pay

## 2017-06-05 ENCOUNTER — Encounter (HOSPITAL_COMMUNITY): Payer: Self-pay

## 2017-06-05 ENCOUNTER — Inpatient Hospital Stay (HOSPITAL_COMMUNITY)
Admission: EM | Admit: 2017-06-05 | Discharge: 2017-06-12 | DRG: 481 | Disposition: A | Discharge status: Skilled Nursing Facility | Payer: Medicare Other | Attending: Internal Medicine | Admitting: Internal Medicine

## 2017-06-05 ENCOUNTER — Emergency Department (HOSPITAL_COMMUNITY): Payer: Medicare Other

## 2017-06-05 DIAGNOSIS — E860 Dehydration: Secondary | ICD-10-CM | POA: Diagnosis not present

## 2017-06-05 DIAGNOSIS — S72402A Unspecified fracture of lower end of left femur, initial encounter for closed fracture: Secondary | ICD-10-CM | POA: Diagnosis present

## 2017-06-05 DIAGNOSIS — W19XXXA Unspecified fall, initial encounter: Secondary | ICD-10-CM | POA: Diagnosis not present

## 2017-06-05 DIAGNOSIS — S7292XA Unspecified fracture of left femur, initial encounter for closed fracture: Secondary | ICD-10-CM | POA: Diagnosis not present

## 2017-06-05 DIAGNOSIS — M2550 Pain in unspecified joint: Secondary | ICD-10-CM | POA: Diagnosis not present

## 2017-06-05 DIAGNOSIS — M62838 Other muscle spasm: Secondary | ICD-10-CM | POA: Diagnosis not present

## 2017-06-05 DIAGNOSIS — Z6824 Body mass index (BMI) 24.0-24.9, adult: Secondary | ICD-10-CM | POA: Diagnosis not present

## 2017-06-05 DIAGNOSIS — M25552 Pain in left hip: Secondary | ICD-10-CM | POA: Diagnosis not present

## 2017-06-05 DIAGNOSIS — Z87442 Personal history of urinary calculi: Secondary | ICD-10-CM

## 2017-06-05 DIAGNOSIS — E7800 Pure hypercholesterolemia, unspecified: Secondary | ICD-10-CM | POA: Diagnosis not present

## 2017-06-05 DIAGNOSIS — Z9181 History of falling: Secondary | ICD-10-CM | POA: Diagnosis not present

## 2017-06-05 DIAGNOSIS — Z886 Allergy status to analgesic agent status: Secondary | ICD-10-CM

## 2017-06-05 DIAGNOSIS — Z881 Allergy status to other antibiotic agents status: Secondary | ICD-10-CM

## 2017-06-05 DIAGNOSIS — M179 Osteoarthritis of knee, unspecified: Secondary | ICD-10-CM | POA: Diagnosis not present

## 2017-06-05 DIAGNOSIS — F32A Depression, unspecified: Secondary | ICD-10-CM | POA: Diagnosis present

## 2017-06-05 DIAGNOSIS — F5104 Psychophysiologic insomnia: Secondary | ICD-10-CM | POA: Diagnosis not present

## 2017-06-05 DIAGNOSIS — G8911 Acute pain due to trauma: Secondary | ICD-10-CM | POA: Diagnosis not present

## 2017-06-05 DIAGNOSIS — I119 Hypertensive heart disease without heart failure: Secondary | ICD-10-CM | POA: Diagnosis not present

## 2017-06-05 DIAGNOSIS — Z09 Encounter for follow-up examination after completed treatment for conditions other than malignant neoplasm: Secondary | ICD-10-CM

## 2017-06-05 DIAGNOSIS — Z8249 Family history of ischemic heart disease and other diseases of the circulatory system: Secondary | ICD-10-CM | POA: Diagnosis not present

## 2017-06-05 DIAGNOSIS — N179 Acute kidney failure, unspecified: Secondary | ICD-10-CM

## 2017-06-05 DIAGNOSIS — I10 Essential (primary) hypertension: Secondary | ICD-10-CM

## 2017-06-05 DIAGNOSIS — M859 Disorder of bone density and structure, unspecified: Secondary | ICD-10-CM | POA: Diagnosis not present

## 2017-06-05 DIAGNOSIS — N952 Postmenopausal atrophic vaginitis: Secondary | ICD-10-CM | POA: Diagnosis not present

## 2017-06-05 DIAGNOSIS — G4700 Insomnia, unspecified: Secondary | ICD-10-CM | POA: Diagnosis not present

## 2017-06-05 DIAGNOSIS — S72392A Other fracture of shaft of left femur, initial encounter for closed fracture: Secondary | ICD-10-CM | POA: Diagnosis not present

## 2017-06-05 DIAGNOSIS — Q820 Hereditary lymphedema: Secondary | ICD-10-CM | POA: Diagnosis not present

## 2017-06-05 DIAGNOSIS — T148XXA Other injury of unspecified body region, initial encounter: Secondary | ICD-10-CM | POA: Diagnosis not present

## 2017-06-05 DIAGNOSIS — Z7901 Long term (current) use of anticoagulants: Secondary | ICD-10-CM

## 2017-06-05 DIAGNOSIS — Z96652 Presence of left artificial knee joint: Secondary | ICD-10-CM | POA: Diagnosis not present

## 2017-06-05 DIAGNOSIS — Z90710 Acquired absence of both cervix and uterus: Secondary | ICD-10-CM

## 2017-06-05 DIAGNOSIS — S72402D Unspecified fracture of lower end of left femur, subsequent encounter for closed fracture with routine healing: Secondary | ICD-10-CM | POA: Diagnosis not present

## 2017-06-05 DIAGNOSIS — K219 Gastro-esophageal reflux disease without esophagitis: Secondary | ICD-10-CM

## 2017-06-05 DIAGNOSIS — M79605 Pain in left leg: Secondary | ICD-10-CM | POA: Diagnosis not present

## 2017-06-05 DIAGNOSIS — F339 Major depressive disorder, recurrent, unspecified: Secondary | ICD-10-CM | POA: Diagnosis not present

## 2017-06-05 DIAGNOSIS — M4120 Other idiopathic scoliosis, site unspecified: Secondary | ICD-10-CM | POA: Diagnosis not present

## 2017-06-05 DIAGNOSIS — E785 Hyperlipidemia, unspecified: Secondary | ICD-10-CM | POA: Diagnosis not present

## 2017-06-05 DIAGNOSIS — D509 Iron deficiency anemia, unspecified: Secondary | ICD-10-CM | POA: Diagnosis not present

## 2017-06-05 DIAGNOSIS — Z87891 Personal history of nicotine dependence: Secondary | ICD-10-CM

## 2017-06-05 DIAGNOSIS — Z885 Allergy status to narcotic agent status: Secondary | ICD-10-CM

## 2017-06-05 DIAGNOSIS — S72492A Other fracture of lower end of left femur, initial encounter for closed fracture: Secondary | ICD-10-CM | POA: Diagnosis not present

## 2017-06-05 DIAGNOSIS — Z6825 Body mass index (BMI) 25.0-25.9, adult: Secondary | ICD-10-CM

## 2017-06-05 DIAGNOSIS — F329 Major depressive disorder, single episode, unspecified: Secondary | ICD-10-CM | POA: Diagnosis not present

## 2017-06-05 DIAGNOSIS — E663 Overweight: Secondary | ICD-10-CM | POA: Diagnosis not present

## 2017-06-05 DIAGNOSIS — D631 Anemia in chronic kidney disease: Secondary | ICD-10-CM | POA: Diagnosis not present

## 2017-06-05 DIAGNOSIS — Y92009 Unspecified place in unspecified non-institutional (private) residence as the place of occurrence of the external cause: Secondary | ICD-10-CM | POA: Diagnosis present

## 2017-06-05 DIAGNOSIS — D62 Acute posthemorrhagic anemia: Secondary | ICD-10-CM | POA: Diagnosis not present

## 2017-06-05 DIAGNOSIS — S199XXA Unspecified injury of neck, initial encounter: Secondary | ICD-10-CM | POA: Diagnosis not present

## 2017-06-05 DIAGNOSIS — M47819 Spondylosis without myelopathy or radiculopathy, site unspecified: Secondary | ICD-10-CM | POA: Diagnosis not present

## 2017-06-05 DIAGNOSIS — Z79899 Other long term (current) drug therapy: Secondary | ICD-10-CM | POA: Diagnosis not present

## 2017-06-05 DIAGNOSIS — S0990XA Unspecified injury of head, initial encounter: Secondary | ICD-10-CM | POA: Diagnosis not present

## 2017-06-05 DIAGNOSIS — N183 Chronic kidney disease, stage 3 (moderate): Secondary | ICD-10-CM

## 2017-06-05 DIAGNOSIS — Z539 Procedure and treatment not carried out, unspecified reason: Secondary | ICD-10-CM | POA: Diagnosis not present

## 2017-06-05 DIAGNOSIS — M9712XA Periprosthetic fracture around internal prosthetic left knee joint, initial encounter: Secondary | ICD-10-CM | POA: Diagnosis not present

## 2017-06-05 DIAGNOSIS — Z7982 Long term (current) use of aspirin: Secondary | ICD-10-CM

## 2017-06-05 DIAGNOSIS — W010XXA Fall on same level from slipping, tripping and stumbling without subsequent striking against object, initial encounter: Secondary | ICD-10-CM | POA: Diagnosis present

## 2017-06-05 DIAGNOSIS — S79912A Unspecified injury of left hip, initial encounter: Secondary | ICD-10-CM | POA: Diagnosis not present

## 2017-06-05 DIAGNOSIS — Z96659 Presence of unspecified artificial knee joint: Secondary | ICD-10-CM | POA: Diagnosis not present

## 2017-06-05 DIAGNOSIS — I129 Hypertensive chronic kidney disease with stage 1 through stage 4 chronic kidney disease, or unspecified chronic kidney disease: Secondary | ICD-10-CM | POA: Diagnosis present

## 2017-06-05 DIAGNOSIS — S728X9A Other fracture of unspecified femur, initial encounter for closed fracture: Secondary | ICD-10-CM | POA: Diagnosis not present

## 2017-06-05 DIAGNOSIS — S72142D Displaced intertrochanteric fracture of left femur, subsequent encounter for closed fracture with routine healing: Secondary | ICD-10-CM | POA: Diagnosis not present

## 2017-06-05 DIAGNOSIS — M109 Gout, unspecified: Secondary | ICD-10-CM | POA: Diagnosis not present

## 2017-06-05 LAB — CBC WITH DIFFERENTIAL/PLATELET
Basophils Absolute: 0 10*3/uL (ref 0.0–0.1)
Basophils Relative: 0 %
Eosinophils Absolute: 0 10*3/uL (ref 0.0–0.7)
Eosinophils Relative: 0 %
HEMATOCRIT: 28.9 % — AB (ref 36.0–46.0)
HEMOGLOBIN: 9.3 g/dL — AB (ref 12.0–15.0)
LYMPHS ABS: 1 10*3/uL (ref 0.7–4.0)
Lymphocytes Relative: 9 %
MCH: 29.1 pg (ref 26.0–34.0)
MCHC: 32.2 g/dL (ref 30.0–36.0)
MCV: 90.3 fL (ref 78.0–100.0)
MONO ABS: 0.5 10*3/uL (ref 0.1–1.0)
MONOS PCT: 5 %
NEUTROS ABS: 9.5 10*3/uL — AB (ref 1.7–7.7)
NEUTROS PCT: 86 %
Platelets: 245 10*3/uL (ref 150–400)
RBC: 3.2 MIL/uL — ABNORMAL LOW (ref 3.87–5.11)
RDW: 13.9 % (ref 11.5–15.5)
WBC: 11.1 10*3/uL — ABNORMAL HIGH (ref 4.0–10.5)

## 2017-06-05 LAB — COMPREHENSIVE METABOLIC PANEL
ALK PHOS: 70 U/L (ref 38–126)
ALT: 19 U/L (ref 14–54)
ANION GAP: 5 (ref 5–15)
AST: 24 U/L (ref 15–41)
Albumin: 3.9 g/dL (ref 3.5–5.0)
BILIRUBIN TOTAL: 0.6 mg/dL (ref 0.3–1.2)
BUN: 30 mg/dL — ABNORMAL HIGH (ref 6–20)
CALCIUM: 9.4 mg/dL (ref 8.9–10.3)
CO2: 26 mmol/L (ref 22–32)
Chloride: 111 mmol/L (ref 101–111)
Creatinine, Ser: 1.82 mg/dL — ABNORMAL HIGH (ref 0.44–1.00)
GFR, EST AFRICAN AMERICAN: 28 mL/min — AB (ref >60)
GFR, EST NON AFRICAN AMERICAN: 25 mL/min — AB (ref >60)
GLUCOSE: 114 mg/dL — AB (ref 65–99)
POTASSIUM: 4 mmol/L (ref 3.5–5.1)
Sodium: 142 mmol/L (ref 135–145)
TOTAL PROTEIN: 6.7 g/dL (ref 6.5–8.1)

## 2017-06-05 LAB — PROTIME-INR
INR: 1.08
Prothrombin Time: 13.9 seconds (ref 11.4–15.2)

## 2017-06-05 LAB — BRAIN NATRIURETIC PEPTIDE: B NATRIURETIC PEPTIDE 5: 49.7 pg/mL (ref 0.0–100.0)

## 2017-06-05 LAB — APTT: APTT: 26 s (ref 24–36)

## 2017-06-05 MED ORDER — ADULT MULTIVITAMIN W/MINERALS CH
1.0000 | ORAL_TABLET | Freq: Every day | ORAL | Status: DC
  Administered 2017-06-07 – 2017-06-12 (×5): 1 via ORAL
  Filled 2017-06-05 (×5): qty 1

## 2017-06-05 MED ORDER — SODIUM CHLORIDE 0.9 % IV SOLN
INTRAVENOUS | Status: DC
  Administered 2017-06-05 – 2017-06-06 (×3): via INTRAVENOUS

## 2017-06-05 MED ORDER — MIRTAZAPINE 15 MG PO TABS
15.0000 mg | ORAL_TABLET | Freq: Every day | ORAL | Status: DC
  Administered 2017-06-05 – 2017-06-11 (×7): 15 mg via ORAL
  Filled 2017-06-05 (×7): qty 1

## 2017-06-05 MED ORDER — ACETAMINOPHEN 325 MG PO TABS: 650.0000 mg | ORAL_TABLET | Freq: Four times a day (QID) | ORAL | Status: DC | PRN

## 2017-06-05 MED ORDER — PANTOPRAZOLE SODIUM 40 MG PO TBEC
40.0000 mg | DELAYED_RELEASE_TABLET | Freq: Every day | ORAL | Status: DC
  Administered 2017-06-06 – 2017-06-12 (×6): 40 mg via ORAL
  Filled 2017-06-05 (×6): qty 1

## 2017-06-05 MED ORDER — ZOLPIDEM TARTRATE 10 MG PO TABS: 10.0000 mg | ORAL_TABLET | Freq: Every day | ORAL | Status: DC

## 2017-06-05 MED ORDER — ZOLPIDEM TARTRATE 5 MG PO TABS
5.0000 mg | ORAL_TABLET | Freq: Every day | ORAL | Status: DC
  Administered 2017-06-05 – 2017-06-11 (×7): 5 mg via ORAL
  Filled 2017-06-05 (×7): qty 1

## 2017-06-05 MED ORDER — GABAPENTIN 300 MG PO CAPS: 300.0000 mg | ORAL_CAPSULE | Freq: Two times a day (BID) | ORAL | Status: DC | PRN

## 2017-06-05 MED ORDER — POLYVINYL ALCOHOL 1.4 % OP SOLN
1.0000 [drp] | OPHTHALMIC | Status: DC | PRN
  Filled 2017-06-05: qty 15

## 2017-06-05 MED ORDER — METHOCARBAMOL 500 MG PO TABS
500.0000 mg | ORAL_TABLET | Freq: Three times a day (TID) | ORAL | Status: DC | PRN
  Administered 2017-06-07: 500 mg via ORAL
  Filled 2017-06-05: qty 1

## 2017-06-05 MED ORDER — AMLODIPINE BESYLATE 5 MG PO TABS
5.0000 mg | ORAL_TABLET | Freq: Every day | ORAL | Status: DC
Start: 2017-06-05 — End: 2017-06-13
  Administered 2017-06-06 – 2017-06-12 (×5): 5 mg via ORAL
  Filled 2017-06-05 (×6): qty 1

## 2017-06-05 MED ORDER — ATORVASTATIN CALCIUM 20 MG PO TABS
20.0000 mg | ORAL_TABLET | Freq: Every evening | ORAL | Status: DC
  Administered 2017-06-05 – 2017-06-12 (×7): 20 mg via ORAL
  Filled 2017-06-05 (×7): qty 1

## 2017-06-05 MED ORDER — MORPHINE SULFATE (PF) 2 MG/ML IV SOLN
2.0000 mg | INTRAVENOUS | Status: DC | PRN
  Administered 2017-06-05: 2 mg via INTRAVENOUS
  Filled 2017-06-05: qty 1

## 2017-06-05 MED ORDER — CARBOXYMETHYLCELLULOSE SODIUM 0.5 % OP SOLN: 1.0000 [drp] | OPHTHALMIC | Status: DC | PRN

## 2017-06-05 MED ORDER — ONDANSETRON HCL 4 MG/2ML IJ SOLN: 4.0000 mg | Freq: Three times a day (TID) | INTRAMUSCULAR | Status: DC | PRN

## 2017-06-05 MED ORDER — HYDRALAZINE HCL 20 MG/ML IJ SOLN: 5.0000 mg | INTRAMUSCULAR | Status: DC | PRN

## 2017-06-05 MED ORDER — DOCUSATE SODIUM 100 MG PO CAPS
100.0000 mg | ORAL_CAPSULE | Freq: Two times a day (BID) | ORAL | Status: DC
  Administered 2017-06-07 – 2017-06-08 (×3): 100 mg via ORAL
  Filled 2017-06-05 (×5): qty 1

## 2017-06-05 MED ORDER — POLYETHYLENE GLYCOL 3350 17 G PO PACK
17.0000 g | PACK | Freq: Every day | ORAL | Status: DC | PRN
  Administered 2017-06-06: 17 g via ORAL
  Filled 2017-06-05: qty 1

## 2017-06-05 MED ORDER — CALCIUM CARBONATE-VITAMIN D 500-200 MG-UNIT PO TABS
1.0000 | ORAL_TABLET | Freq: Two times a day (BID) | ORAL | Status: DC
  Administered 2017-06-05 – 2017-06-12 (×12): 1 via ORAL
  Filled 2017-06-05 (×13): qty 1

## 2017-06-05 MED ORDER — COLESEVELAM HCL 625 MG PO TABS
625.0000 mg | ORAL_TABLET | ORAL | Status: DC
  Administered 2017-06-06 – 2017-06-11 (×3): 625 mg via ORAL
  Filled 2017-06-05 (×3): qty 1

## 2017-06-05 MED ORDER — OXYCODONE-ACETAMINOPHEN 5-325 MG PO TABS
1.0000 | ORAL_TABLET | ORAL | Status: DC | PRN
  Administered 2017-06-05 – 2017-06-09 (×12): 1 via ORAL
  Filled 2017-06-05 (×12): qty 1

## 2017-06-05 MED ORDER — LORATADINE 10 MG PO TABS
10.0000 mg | ORAL_TABLET | Freq: Every day | ORAL | Status: DC
  Administered 2017-06-07 – 2017-06-12 (×5): 10 mg via ORAL
  Filled 2017-06-05 (×5): qty 1

## 2017-06-05 MED ORDER — FERROUS SULFATE 325 (65 FE) MG PO TABS
325.0000 mg | ORAL_TABLET | Freq: Three times a day (TID) | ORAL | Status: DC
  Administered 2017-06-06 – 2017-06-12 (×16): 325 mg via ORAL
  Filled 2017-06-05 (×17): qty 1

## 2017-06-05 MED ORDER — MORPHINE SULFATE (PF) 2 MG/ML IV SOLN
1.0000 mg | INTRAVENOUS | Status: DC | PRN
  Administered 2017-06-05: 1 mg via INTRAVENOUS
  Filled 2017-06-05 (×2): qty 1

## 2017-06-05 NOTE — H&P (Signed)
History and Physical    Nancy Suarez:[id number] DOB: 04-13-1932 DOA: 06/05/2017  Referring MD/NP/PA:   PCP: System, Pcp Not In   Patient coming from:  The patient is coming from home.  At baseline, pt is independent for most of ADL.        Chief Complaint: fall and left hip pain  HPI: Nancy Suarez is a 83 y.o. female with medical history significant of hypertension, hyperlipidemia, GERD, pancreatitis, iron deficiency anemia, CKD-3, who presents with fall and the left hip pain.  Patient states that she tripped over her husband's walker at home and falling injuring her left leg last night. She fell again at about 2:00 PM. She developed a severe pain in the left leg above the knee joint. The pain is constant, 10 out of 10 in severity, sharp, nonradiating. No leg numbness. Patient states that she probably hit her right side of head against wall, but did not injure her neck. Patient denies unilateral weakness, numbness or tingling to extremities. No chest pain, shortness breath, cough, fever or chills. Denies nausea, vomiting, diarrhea, abdominal pain, symptoms of UTI. She is S/P of left total knee replacement 2-3 years ago by Dr Durene Romans.    ED Course: pt was found to have WBC 11.1, worsening renal function, temperature normal, no tachycardia, oxygen saturation 100% on room air. X-ray of her left hip is negative. X-ray of left knee and left femur showed comminuted left distal femur fracture. Pt is admitted to MedSurg bed as inpatient. Orthopedic surgeon, Dr. Ranell Patrick was consulted.  Review of Systems:   General: no fevers, chills, no body weight gain,  has fatigue HEENT: no blurry vision, hearing changes or sore throat Respiratory: no dyspnea, coughing, wheezing CV: no chest pain, no palpitations GI: no nausea, vomiting, abdominal pain, diarrhea, constipation GU: no dysuria, burning on urination, increased urinary frequency, hematuria  Ext: no leg edema Neuro: no unilateral weakness,  numbness, or tingling, no vision change or hearing loss. Had fall. Skin: no rash, no skin tear. MSK: has left leg pain above knee joint Heme: No easy bruising.  Travel history: No recent long distant travel.  Allergy:  Allergies  Allergen Reactions  . Aspirin     Upsets stomach if 325mg   . Codeine Nausea And Vomiting  . Sulfa Antibiotics Nausea Only    Past Medical History:  Diagnosis Date  . ASCUS (atypical squamous cells of undetermined significance) on Pap smear   . Atrophic vaginitis   . Chronic insomnia   . Depression   . GERD (gastroesophageal reflux disease)   . Goiter   . History of kidney stones   . Hypercholesteremia   . Hypertension   . LGSIL (low grade squamous intraepithelial dysplasia)   . Osteopenia   . Ovarian cyst    "shrank it"  . Pancreatitis   . Scoliosis   . Spinal arthritis     Past Surgical History:  Procedure Laterality Date  . ABDOMINAL HYSTERECTOMY  1977   partial  . KIDNEY STONE SURGERY  1990's   2-3 stones x1 surgery  . TONSILLECTOMY  as teenager  . TOTAL KNEE ARTHROPLASTY Left 01/19/2014   Procedure: LEFT TOTAL KNEE ARTHROPLASTY;  Surgeon: Shelda Pal, MD;  Location: [facility];  Service: Orthopedics;  Laterality: Left;  . WISDOM TOOTH EXTRACTION      Social History:  reports that she quit smoking about 38 years ago. Her smoking use included cigarettes. She quit after 20.00 years of use. she has  never used smokeless tobacco. She reports that she drinks alcohol. She reports that she does not use drugs.  Family History:  Family History  Problem Relation Age of Onset  . Hypertension Father      Prior to Admission medications   Medication Sig Start Date End Date Taking? Authorizing Provider  aspirin 81 MG chewable tablet Chew 1 tablet (81 mg total) by mouth every other day. 01/21/14  Yes Babish, Molli Hazard, PA-C  atorvastatin (LIPITOR) 20 MG tablet Take 20 mg by mouth every evening.  03/29/17  Yes [provider]  calcium-vitamin D  (OSCAL WITH D) 500-200 MG-UNIT per tablet Take 1 tablet by mouth 2 (two) times daily.   Yes [provider]  carboxymethylcellulose (REFRESH PLUS) 0.5 % SOLN Place 1 drop into both eyes as needed (Dry eyes).   Yes [provider]  cetirizine (ZYRTEC) 10 MG tablet Take 10 mg by mouth as needed for allergies.   Yes [provider]  furosemide (LASIX) 20 MG tablet take 1 tablet by mouth once daily if needed for SWELLING 06/02/17  Yes [provider]  gabapentin (NEURONTIN) 300 MG capsule Take 300 mg by mouth 2 (two) times daily as needed (pain).  03/04/17  Yes [provider]  losartan-hydrochlorothiazide (HYZAAR) 100-25 MG tablet Take 1 tablet by mouth daily.  03/29/17  Yes [provider]  mirtazapine (REMERON) 15 MG tablet Take 15 mg by mouth at bedtime.   Yes [provider]  Multiple Vitamin (MULTIVITAMIN WITH MINERALS) TABS tablet Take 1 tablet by mouth daily.   Yes [provider]  omeprazole (PRILOSEC) 20 MG capsule Take 20 mg by mouth daily.   Yes [provider]  oxyCODONE-acetaminophen (PERCOCET) 7.5-325 MG tablet take 1 tablet by mouth every 4 hours if needed for pain 03/09/17  Yes [provider]  zolpidem (AMBIEN) 10 MG tablet Take 10 mg by mouth at bedtime.   Yes [provider]  atorvastatin (LIPITOR) 10 MG tablet Take 10 mg by mouth daily.    [provider]  colesevelam [facility]) 625 MG tablet Take 625 mg by mouth every Monday, Wednesday, and Friday.    [provider]  docusate sodium 100 MG CAPS Take 100 mg by mouth 2 (two) times daily. Patient not taking: Reported on 06/05/2017 01/21/14   Lanney Gins, PA-C  ferrous sulfate 325 (65 FE) MG tablet Take 1 tablet (325 mg total) by mouth 3 (three) times daily after meals. Patient not taking: Reported on 06/05/2017 01/21/14   Lanney Gins, PA-C  olmesartan-hydrochlorothiazide (BENICAR HCT) 40-25 MG per tablet Take 1 tablet by  mouth every morning.    [provider]  oxyCODONE (OXY IR/ROXICODONE) 5 MG immediate release tablet Take 1-3 tablets (5-15 mg total) by mouth every 4 (four) hours as needed for severe pain. Patient not taking: Reported on 06/05/2017 01/21/14   Lanney Gins, PA-C  oxyCODONE-acetaminophen (PERCOCET/ROXICET) 5-325 MG per tablet Take 1 tablet by mouth every 4 (four) hours as needed for severe pain (for pain). Patient not taking: Reported on 06/05/2017 06/17/14   Molpus, John, MD  polyethylene glycol (MIRALAX / GLYCOLAX) packet Take 17 g by mouth 2 (two) times daily. Patient not taking: Reported on 06/05/2017 01/21/14   Lanney Gins, PA-C  rivaroxaban (XARELTO) 10 MG TABS tablet Take 1 tablet (10 mg total) by mouth daily. Patient not taking: Reported on 06/05/2017 01/21/14   Lanney Gins, PA-C  tiZANidine (ZANAFLEX) 4 MG tablet Take 1 tablet (4 mg total) by mouth  every 6 (six) hours as needed. Patient not taking: Reported on 06/05/2017 01/21/14   Lanney GinsBabish, Matthew, PA-C    Physical Exam: Vitals:   06/05/17 1932 06/05/17 1933 06/05/17 2000 06/05/17 2159  BP: 140/76 140/76 126/66 (!) 147/46  Pulse:  88 84 83  Resp:  18 18 17   Temp:    98 F (36.7 C)  TempSrc:    Oral  SpO2:  100% 100% 98%  Weight:      Height:       General: Not in acute distress HEENT:       Eyes: PERRL, EOMI, no scleral icterus.       ENT: No discharge from the ears and nose, no pharynx injection, no tonsillar enlargement.        Neck: No JVD, no bruit, no mass felt. Heme: No neck lymph node enlargement. Cardiac: S1/S2, RRR, No murmurs, No gallops or rubs. Respiratory: No rales, wheezing, rhonchi or rubs. GI: Soft, nondistended, nontender, no rebound pain, no organomegaly, BS present. GU: No hematuria Ext: No pitting leg edema bilaterally. 2+DP/PT pulse bilaterally. Musculoskeletal: has left leg tenderness above knee jointSkin:  No rashes.  Neuro: Alert, oriented X3, cranial nerves II-XII grossly intact,  moves all extremities. Psych: Patient is not psychotic, no suicidal or hemocidal ideation.  Labs on Admission: I have personally reviewed following labs and imaging studies  CBC: Recent Labs  Lab 06/05/17 1817  WBC 11.1*  NEUTROABS 9.5*  HGB 9.3*  HCT 28.9*  MCV 90.3  PLT 245   Basic Metabolic Panel: Recent Labs  Lab 06/05/17 1817  NA 142  K 4.0  CL 111  CO2 26  GLUCOSE 114*  BUN 30*  CREATININE 1.82*  CALCIUM 9.4   GFR: Estimated Creatinine Clearance: 23.7 mL/min (A) (by C-G formula based on SCr of 1.82 mg/dL (H)). Liver Function Tests: Recent Labs  Lab 06/05/17 1817  AST 24  ALT 19  ALKPHOS 70  BILITOT 0.6  PROT 6.7  ALBUMIN 3.9   No results for input(s): LIPASE, AMYLASE in the last 168 hours. No results for input(s): AMMONIA in the last 168 hours. Coagulation Profile: Recent Labs  Lab 06/05/17 2215  INR 1.08   Cardiac Enzymes: No results for input(s): CKTOTAL, CKMB, CKMBINDEX, TROPONINI in the last 168 hours. BNP (last 3 results) No results for input(s): PROBNP in the last 8760 hours. HbA1C: No results for input(s): HGBA1C in the last 72 hours. CBG: No results for input(s): GLUCAP in the last 168 hours. Lipid Profile: No results for input(s): CHOL, HDL, LDLCALC, TRIG, CHOLHDL, LDLDIRECT in the last 72 hours. Thyroid Function Tests: No results for input(s): TSH, T4TOTAL, FREET4, T3FREE, THYROIDAB in the last 72 hours. Anemia Panel: No results for input(s): VITAMINB12, FOLATE, FERRITIN, TIBC, IRON, RETICCTPCT in the last 72 hours. Urine analysis:    Component Value Date/Time   COLORURINE YELLOW 01/12/2014 1537   APPEARANCEUR CLEAR 01/12/2014 1537   LABSPEC 1.024 01/12/2014 1537   PHURINE 5.0 01/12/2014 1537   GLUCOSEU NEGATIVE 01/12/2014 1537   HGBUR NEGATIVE 01/12/2014 1537   BILIRUBINUR NEGATIVE 01/12/2014 1537   KETONESUR NEGATIVE 01/12/2014 1537   PROTEINUR NEGATIVE 01/12/2014 1537   UROBILINOGEN 0.2 01/12/2014 1537   NITRITE NEGATIVE  01/12/2014 1537   LEUKOCYTESUR SMALL (A) 01/12/2014 1537   Sepsis Labs: @LABRCNTIP (procalcitonin:4,lacticidven:4) )No results found for this or any previous visit (from the past 240 hour(s)).   Radiological Exams on Admission: Dg Knee 1-2 Views Left  Result Date: 06/05/2017 CLINICAL DATA:  Recent fall  with distal thigh pain, initial encounter EXAM: LEFT KNEE - 1-2 VIEW COMPARISON:  None. FINDINGS: There is a comminuted fracture of the distal left femur involving the junction of the diaphysis and metaphysis. The fracture lines appear to extend to the level of the femoral prosthesis. Some mild posterior displacement of the distal fracture fragments is noted. IMPRESSION: Comminuted distal femoral fracture which appears to extend towards the femoral portion of the knee prosthesis. Electronically Signed   By: Alcide Clever M.D.   On: 06/05/2017 18:19   Dg Hip Unilat W Or Wo Pelvis 2-3 Views Left  Result Date: 06/05/2017 CLINICAL DATA:  Recent fall with left leg pain, initial encounter EXAM: DG HIP (WITH OR WITHOUT PELVIS) 3V LEFT COMPARISON:  None. FINDINGS: Degenerative changes of lumbar spine are noted. The pelvic ring is intact. Mild degenerative changes of the hip joints are noted bilaterally. No acute fracture is noted. No soft tissue changes are seen. IMPRESSION: Degenerative change without acute abnormality. Electronically Signed   By: Alcide Clever M.D.   On: 06/05/2017 18:20   Dg Femur Min 2 Views Left  Result Date: 06/05/2017 CLINICAL DATA:  Recent fall with left leg pain, initial encounter EXAM: LEFT FEMUR 2 VIEWS COMPARISON:  None. FINDINGS: Proximal femur is within normal limits. Comminuted distal femoral fracture is noted at the junction of the diaphysis and metaphysis with extension towards the femoral portion of the knee prosthesis. Some posterior displacement of the distal fracture fragments is noted. No other focal abnormality is seen. IMPRESSION: Comminuted distal left femoral fracture  as described. Electronically Signed   By: Alcide Clever M.D.   On: 06/05/2017 18:19     EKG:  Not done in ED, will get one.   Assessment/Plan Principal Problem:   Closed fracture of left distal femur (HCC) Active Problems:   GERD (gastroesophageal reflux disease)   Overweight (BMI 25.0-29.9)   Essential hypertension   HLD (hyperlipidemia)   Depression   Iron deficiency anemia   Acute renal failure superimposed on stage 3 chronic kidney disease (HCC)   Fall   Closed fracture of left distal femur due to fall: Patient seems to have had a mechanical fall. No focal neurological findings on physical examination. She has closed fracture of left distal femu, ss evidenced by x-ray. No neurovascular compromise. Orthopedic surgeon was consulted. Dr. Ranell Patrick saw pt-->surgery tomorrow  - will admit to Med-surg bed - Pain control: morphine prn and percocet - When necessary Zofran for nausea - Robaxin for muscle spasm - type and cross - INR/PTT - f/u CT-head and c spin.  Leukocytosis: WBS 11.1 Likely due to stress-induced demargination. Patient does not have signs of infection. -Follow-up CBC  GERD: -Protonix  HTN:  -Hold Lasix, Benicar, HCTZ due to worsening renal function -Start amlodipine -IV hydralazine prn  HLD: -Lipitor  Depression: Stable, no suicidal or homicidal ideations. -Continue home medications: Mirtazapine  GERD: -Protonix  Iron deficiency anemia: Hemoglobin stable 9.3, which was 9.5 on 01/21/14 -Continue iron supplement.  AoCKD-III: Baseline Cre is 1.0-1.2, pt's Cre is 1.82, BUN 30 on admission. Likely due to prerenal secondary to dehydration and continuation of ARB, diuretics. - IVF: NS 75 cc/h - Check FeUrea - Follow up renal function by BMP - Hold Lasix, Benicar, HCTZ   DVT ppx: SCD Code Status: Full code. Pt states that she is not ready to make decision now. She wants to discuss with her daughter. Will put full code temporarily now. This needs to be  addressed again morning.  Family Communication: None at bed side.      Disposition Plan:  To be determined, may be to rehab facility Consults called:  Ortho, Dr. Ranell PatrickNorris Admission status: medical floor/obs        Date of Service 06/06/2017    Lorretta HarpXilin Ashyah Quizon [facility] Pager [phone]  If 7PM-7AM, please contact night-coverage [email or web address] Password [email or web address] 06/06/2017, 1:51 AM

## 2017-06-05 NOTE — ED Notes (Signed)
Attempt report x1  

## 2017-06-05 NOTE — ED Notes (Signed)
ED TO INPATIENT HANDOFF REPORT  Name/Age/Gender Nancy Suarez 83 y.o. female  Code Status    Code Status Orders  (From admission, onward)        Start     Ordered   06/05/17 1947  Full code  Continuous     06/05/17 1946    Code Status History    Date Active Date Inactive Code Status Order ID Comments User Context   01/19/2014 16:04 01/21/2014 17:27 Full Code [id number]  Pricilla Loveless, PA-C Inpatient      Home/SNF/Other Home  Chief Complaint fall left leg and left hip pain  Level of Care/Admitting Diagnosis ED Disposition    ED Disposition Condition [place]: [facility] [[id number]]  Level of Care: Med-Surg [16]  Diagnosis: Closed fracture of left distal femur [facility]) [[id number]]  Admitting Physician: Ivor Costa [[id number]]  Attending Physician: Ivor Costa [phone]  Estimated length of stay: past midnight tomorrow  Certification:: I certify this patient will need inpatient services for at least 2 midnights  PT Class (Do Not Modify): Inpatient [101]  PT Acc Code (Do Not Modify): Private [1]       Medical History Past Medical History:  Diagnosis Date  . ASCUS (atypical squamous cells of undetermined significance) on Pap smear   . Atrophic vaginitis   . Chronic insomnia   . Depression   . GERD (gastroesophageal reflux disease)   . Goiter   . History of kidney stones   . Hypercholesteremia   . Hypertension   . LGSIL (low grade squamous intraepithelial dysplasia)   . Osteopenia   . Ovarian cyst    "shrank it"  . Pancreatitis   . Scoliosis   . Spinal arthritis     Allergies Allergies  Allergen Reactions  . Aspirin     Upsets stomach if 313m  . Codeine Nausea And Vomiting  . Sulfa Antibiotics Nausea Only    IV Location/Drains/Wounds Patient Lines/Drains/Airways Status   Active Line/Drains/Airways    Name:   Placement date:   Placement time:   Site:   Days:   Peripheral IV 06/05/17 Right Antecubital   06/05/17     1846    Antecubital   less than 1          Labs/Imaging Results for orders placed or performed during the hospital encounter of 06/05/17 (from the past 48 hour(s))  Type and screen [place]    Status: None   Collection Time: 06/05/17  6:16 PM  Result Value Ref Range   ABO/RH(D) O POS    Antibody Screen NEG    Sample Expiration 06/08/2017   CBC with Differential/Platelet     Status: Abnormal   Collection Time: 06/05/17  6:17 PM  Result Value Ref Range   WBC 11.1 (H) 4.0 - 10.5 K/uL   RBC 3.20 (L) 3.87 - 5.11 MIL/uL   Hemoglobin 9.3 (L) 12.0 - 15.0 g/dL   HCT 28.9 (L) 36.0 - 46.0 %   MCV 90.3 78.0 - 100.0 fL   MCH 29.1 26.0 - 34.0 pg   MCHC 32.2 30.0 - 36.0 g/dL   RDW 13.9 11.5 - 15.5 %   Platelets 245 150 - 400 K/uL   Neutrophils Relative % 86 %   Neutro Abs 9.5 (H) 1.7 - 7.7 K/uL   Lymphocytes Relative 9 %   Lymphs Abs 1.0 0.7 - 4.0 K/uL   Monocytes Relative 5 %   Monocytes Absolute 0.5  0.1 - 1.0 K/uL   Eosinophils Relative 0 %   Eosinophils Absolute 0.0 0.0 - 0.7 K/uL   Basophils Relative 0 %   Basophils Absolute 0.0 0.0 - 0.1 K/uL  Comprehensive metabolic panel     Status: Abnormal   Collection Time: 06/05/17  6:17 PM  Result Value Ref Range   Sodium 142 135 - 145 mmol/L   Potassium 4.0 3.5 - 5.1 mmol/L   Chloride 111 101 - 111 mmol/L   CO2 26 22 - 32 mmol/L   Glucose, Bld 114 (H) 65 - 99 mg/dL   BUN 30 (H) 6 - 20 mg/dL   Creatinine, Ser 1.82 (H) 0.44 - 1.00 mg/dL   Calcium 9.4 8.9 - 10.3 mg/dL   Total Protein 6.7 6.5 - 8.1 g/dL   Albumin 3.9 3.5 - 5.0 g/dL   AST 24 15 - 41 U/L   ALT 19 14 - 54 U/L   Alkaline Phosphatase 70 38 - 126 U/L   Total Bilirubin 0.6 0.3 - 1.2 mg/dL   GFR calc non Af Amer 25 (L) >60 mL/min   GFR calc Af Amer 28 (L) >60 mL/min    Comment: (NOTE) The eGFR has been calculated using the CKD EPI equation. This calculation has not been validated in all clinical situations. eGFR's persistently <60 mL/min signify  possible Chronic Kidney Disease.    Anion gap 5 5 - 15   Dg Knee 1-2 Views Left  Result Date: 06/05/2017 CLINICAL DATA:  Recent fall with distal thigh pain, initial encounter EXAM: LEFT KNEE - 1-2 VIEW COMPARISON:  None. FINDINGS: There is a comminuted fracture of the distal left femur involving the junction of the diaphysis and metaphysis. The fracture lines appear to extend to the level of the femoral prosthesis. Some mild posterior displacement of the distal fracture fragments is noted. IMPRESSION: Comminuted distal femoral fracture which appears to extend towards the femoral portion of the knee prosthesis. Electronically Signed   By: Inez Catalina M.D.   On: 06/05/2017 18:19   Dg Hip Unilat W Or Wo Pelvis 2-3 Views Left  Result Date: 06/05/2017 CLINICAL DATA:  Recent fall with left leg pain, initial encounter EXAM: DG HIP (WITH OR WITHOUT PELVIS) 3V LEFT COMPARISON:  None. FINDINGS: Degenerative changes of lumbar spine are noted. The pelvic ring is intact. Mild degenerative changes of the hip joints are noted bilaterally. No acute fracture is noted. No soft tissue changes are seen. IMPRESSION: Degenerative change without acute abnormality. Electronically Signed   By: Inez Catalina M.D.   On: 06/05/2017 18:20   Dg Femur Min 2 Views Left  Result Date: 06/05/2017 CLINICAL DATA:  Recent fall with left leg pain, initial encounter EXAM: LEFT FEMUR 2 VIEWS COMPARISON:  None. FINDINGS: Proximal femur is within normal limits. Comminuted distal femoral fracture is noted at the junction of the diaphysis and metaphysis with extension towards the femoral portion of the knee prosthesis. Some posterior displacement of the distal fracture fragments is noted. No other focal abnormality is seen. IMPRESSION: Comminuted distal left femoral fracture as described. Electronically Signed   By: Inez Catalina M.D.   On: 06/05/2017 18:19    Pending Labs Unresulted Labs (From admission, onward)   Start     Ordered    06/06/17 7416  Basic metabolic panel  Tomorrow morning,   R     06/05/17 1946   06/06/17 0500  CBC  Tomorrow morning,   R     06/05/17 1946   06/05/17  1945  Brain natriuretic peptide  Once,   R     06/05/17 1944   06/05/17 1945  Protime-INR  Once,   R     06/05/17 1944   06/05/17 1945  APTT  Once,   R     06/05/17 1944   06/05/17 1944  Creatinine, urine, random  Once,   R     06/05/17 1944   06/05/17 1944  Urea nitrogen, urine  Once,   R     06/05/17 1944      Vitals/Pain Today's Vitals   06/05/17 1857 06/05/17 1932 06/05/17 1933 06/05/17 1943  BP:  140/76 140/76   Pulse:   88   Resp:   18   Temp:      TempSrc:      SpO2:   100%   Weight:      Height:      PainSc: 5    5     Isolation Precautions No active isolations  Medications Medications  morphine 2 MG/ML injection 1 mg (not administered)  colesevelam [facility]) tablet 625 mg (not administered)  docusate sodium (COLACE) capsule 100 mg (not administered)  ferrous sulfate tablet 325 mg (not administered)  polyethylene glycol (MIRALAX / GLYCOLAX) packet 17 g (not administered)  oxyCODONE-acetaminophen (PERCOCET/ROXICET) 5-325 MG per tablet 1 tablet (not administered)  atorvastatin (LIPITOR) tablet 20 mg (20 mg Oral Given 06/05/17 2026)  calcium-vitamin D (OSCAL WITH D) 500-200 MG-UNIT per tablet 1 tablet (1 tablet Oral Given 06/05/17 2026)  loratadine (CLARITIN) tablet 10 mg (not administered)  gabapentin (NEURONTIN) capsule 300 mg (not administered)  mirtazapine (REMERON) tablet 15 mg (15 mg Oral Given 06/05/17 2027)  multivitamin with minerals tablet 1 tablet (not administered)  pantoprazole (PROTONIX) EC tablet 40 mg (not administered)  methocarbamol (ROBAXIN) tablet 500 mg (not administered)  amLODipine (NORVASC) tablet 5 mg (not administered)  hydrALAZINE (APRESOLINE) injection 5 mg (not administered)  ondansetron (ZOFRAN) injection 4 mg (not administered)  acetaminophen (TYLENOL) tablet 650 mg (not  administered)  0.9 %  sodium chloride infusion (not administered)  polyvinyl alcohol (LIQUIFILM TEARS) 1.4 % ophthalmic solution 1 drop (not administered)  zolpidem (AMBIEN) tablet 5 mg (not administered)    Mobility non-ambulatory at this time.

## 2017-06-05 NOTE — Consult Note (Signed)
Orthopedics Consultation   Nancy Suarez  ZOX:[id number]  DOB: 09/10/1931  DOA: 06/05/2017   Requesting physician: [facility]da [facility]ED[facility]  Reason for consultation: Left periprosthetic distal femur fracture  History of Present Illness: Nancy Suarez is an 83 y.o. female who reports tripping over her husband's walker at home and falling injuring her left leg.  This happened yesterday and she tried to stay at home thinking it would feel better.  She called EMS today as the pain was not improving.  She was unable to weight bear on the left LE.  S/P left total knee replacement 2-3 years ago by Dr Durene RomansMatthew Olin.  No other complaints.    Past Medical History: Past Medical History:  Diagnosis Date  . ASCUS (atypical squamous cells of undetermined significance) on Pap smear   . Atrophic vaginitis   . Chronic insomnia   . Depression   . GERD (gastroesophageal reflux disease)   . Goiter   . History of kidney stones   . Hypercholesteremia   . Hypertension   . LGSIL (low grade squamous intraepithelial dysplasia)   . Osteopenia   . Ovarian cyst    "shrank it"  . Pancreatitis   . Scoliosis   . Spinal arthritis     Past Surgical History: Past Surgical History:  Procedure Laterality Date  . ABDOMINAL HYSTERECTOMY  1977   partial  . KIDNEY STONE SURGERY  1990's   2-3 stones x1 surgery  . TONSILLECTOMY  as teenager  . TOTAL KNEE ARTHROPLASTY Left 01/19/2014   Procedure: LEFT TOTAL KNEE ARTHROPLASTY;  Surgeon: Shelda PalMatthew D Olin, MD;  Location: [facility];  Service: Orthopedics;  Laterality: Left;  . WISDOM TOOTH EXTRACTION       Allergies:   Allergies  Allergen Reactions  . Aspirin     Upsets stomach if 325mg   . Codeine Nausea And Vomiting  . Sulfa Antibiotics Nausea Only     Social History:  reports that she quit smoking about 38 years ago. Her smoking use included cigarettes. She quit after 20.00 years of use. she has never used smokeless tobacco. She reports that  she drinks alcohol. She reports that she does not use drugs.   Family History: Family History  Problem Relation Age of Onset  . Hypertension Father       Physical Exam: Vitals:   06/05/17 1726 06/05/17 1830 06/05/17 1932 06/05/17 1933  BP: (!) 141/56 (!) 129/97 140/76 140/76  Pulse: 88 85  88  Resp: 20   18  Temp: 98 F (36.7 C)     TempSrc: Oral     SpO2: 96% 100%  100%  Weight: 74.8 kg (165 lb)     Height: 5\' 5"  (1.651 m)       Physical Exam:   HEENT: NCAT/ EOMI Neck non tender Chest non tender with normal excursion T and L spine non tender and no stepoffs Bilateral UEs with no tenderness and normal AROM bilateral shoulders and elbows and wrists. NVI Bilateral UEs Left LE immobilized in knee immobilizer,  Right LE with moderate knee swelling and tenderness consistent with her diagnosis of knee OA. No deformity on the right and no pain with AROM  Data reviewed:  I have personally reviewed following labs and imaging studies Labs:  CBC: Recent Labs  Lab 06/05/17 1817  WBC 11.1*  NEUTROABS 9.5*  HGB 9.3*  HCT 28.9*  MCV 90.3  PLT  245    Basic Metabolic Panel: Recent Labs  Lab 06/05/17 1817  NA 142  K 4.0  CL 111  CO2 26  GLUCOSE 114*  BUN 30*  CREATININE 1.82*  CALCIUM 9.4   GFR Estimated Creatinine Clearance: 23.7 mL/min (A) (by C-G formula based on SCr of 1.82 mg/dL (H)). Liver Function Tests: Recent Labs  Lab 06/05/17 1817  AST 24  ALT 19  ALKPHOS 70  BILITOT 0.6  PROT 6.7  ALBUMIN 3.9   No results for input(s): LIPASE, AMYLASE in the last 168 hours. No results for input(s): AMMONIA in the last 168 hours. Coagulation profile No results for input(s): INR, PROTIME in the last 168 hours.  Cardiac Enzymes: No results for input(s): CKTOTAL, CKMB, CKMBINDEX, TROPONINI in the last 168 hours. BNP: Invalid input(s): POCBNP CBG: No results for input(s): GLUCAP in the last 168 hours. D-Dimer No results for input(s): DDIMER in the last 72  hours. Hgb A1c No results for input(s): HGBA1C in the last 72 hours. Lipid Profile No results for input(s): CHOL, HDL, LDLCALC, TRIG, CHOLHDL, LDLDIRECT in the last 72 hours. Thyroid function studies No results for input(s): TSH, T4TOTAL, T3FREE, THYROIDAB in the last 72 hours.  Invalid input(s): FREET3 Anemia work up No results for input(s): VITAMINB12, FOLATE, FERRITIN, TIBC, IRON, RETICCTPCT in the last 72 hours. Urinalysis    Component Value Date/Time   COLORURINE YELLOW 01/12/2014 1537   APPEARANCEUR CLEAR 01/12/2014 1537   LABSPEC 1.024 01/12/2014 1537   PHURINE 5.0 01/12/2014 1537   GLUCOSEU NEGATIVE 01/12/2014 1537   HGBUR NEGATIVE 01/12/2014 1537   BILIRUBINUR NEGATIVE 01/12/2014 1537   KETONESUR NEGATIVE 01/12/2014 1537   PROTEINUR NEGATIVE 01/12/2014 1537   UROBILINOGEN 0.2 01/12/2014 1537   NITRITE NEGATIVE 01/12/2014 1537   LEUKOCYTESUR SMALL (A) 01/12/2014 1537     Sepsis Labs Invalid input(s): PROCALCITONIN,  WBC,  LACTICIDVEN Microbiology No results found for this or any previous visit (from the past 240 hour(s)).     Inpatient Medications:   Scheduled Meds: . amLODipine  5 mg Oral Daily  . atorvastatin  20 mg Oral QPM  . calcium-vitamin D  1 tablet Oral BID  . [START ON 06/06/2017] colesevelam  625 mg Oral Q M,W,F  . DSS  100 mg Oral BID  . ferrous sulfate  325 mg Oral TID PC  . loratadine  10 mg Oral Daily  . mirtazapine  15 mg Oral QHS  . multivitamin with minerals  1 tablet Oral Daily  . pantoprazole  40 mg Oral Daily  . zolpidem  10 mg Oral QHS   Continuous Infusions:   Radiological Exams on Admission: Dg Knee 1-2 Views Left  Result Date: 06/05/2017 CLINICAL DATA:  Recent fall with distal thigh pain, initial encounter EXAM: LEFT KNEE - 1-2 VIEW COMPARISON:  None. FINDINGS: There is a comminuted fracture of the distal left femur involving the junction of the diaphysis and metaphysis. The fracture lines appear to extend to the level of the  femoral prosthesis. Some mild posterior displacement of the distal fracture fragments is noted. IMPRESSION: Comminuted distal femoral fracture which appears to extend towards the femoral portion of the knee prosthesis. Electronically Signed   By: Alcide Clever M.D.   On: 06/05/2017 18:19   Dg Hip Unilat W Or Wo Pelvis 2-3 Views Left  Result Date: 06/05/2017 CLINICAL DATA:  Recent fall with left leg pain, initial encounter EXAM: DG HIP (WITH OR WITHOUT PELVIS) 3V LEFT COMPARISON:  None. FINDINGS: Degenerative changes of  lumbar spine are noted. The pelvic ring is intact. Mild degenerative changes of the hip joints are noted bilaterally. No acute fracture is noted. No soft tissue changes are seen. IMPRESSION: Degenerative change without acute abnormality. Electronically Signed   By: Alcide CleverMark  Lukens M.D.   On: 06/05/2017 18:20   Dg Femur Min 2 Views Left  Result Date: 06/05/2017 CLINICAL DATA:  Recent fall with left leg pain, initial encounter EXAM: LEFT FEMUR 2 VIEWS COMPARISON:  None. FINDINGS: Proximal femur is within normal limits. Comminuted distal femoral fracture is noted at the junction of the diaphysis and metaphysis with extension towards the femoral portion of the knee prosthesis. Some posterior displacement of the distal fracture fragments is noted. No other focal abnormality is seen. IMPRESSION: Comminuted distal left femoral fracture as described. Electronically Signed   By: Alcide CleverMark  Lukens M.D.   On: 06/05/2017 18:19    Impression/Recommendations Principal Problem:   Closed fracture of left distal femur (HCC) Active Problems:   GERD (gastroesophageal reflux disease)   Overweight (BMI 25.0-29.9)   Essential hypertension   HLD (hyperlipidemia)   Depression   Iron deficiency anemia   Acute renal failure superimposed on stage 3 chronic kidney disease (HCC)  Patient will require ORIF of the left femur fracture. No anticoagulants pending surgery. Mechanical DVT prophylaxis Bed Rest Discussed  with Dr Charlann Boxerlin who will see the patient tomorrow. Possible surgery tomorrow so NPO after MN  Appreciate internal medicine admitting and getting medically optimized for surgery tomorrow   Thank you for this consultation.       Verlee RossettiNORRIS,STEVEN R M.D. [facility] 06/05/2017, 7:45 PM

## 2017-06-05 NOTE — ED Provider Notes (Signed)
[facility]-EMERGENCY DEPT Provider Note   CSN: [id number] Arrival date & time: 06/05/17  1647     History   Chief Complaint Chief Complaint  Patient presents with  . Fall  . Leg Injury    HPI Nancy Suarez is a 83 y.o. female.  The history is provided by the patient. No language interpreter was used.  Fall  This is a new problem. The current episode started yesterday. The problem occurs constantly. The problem has been gradually worsening. Nothing aggravates the symptoms. Nothing relieves the symptoms. She has tried nothing for the symptoms. The treatment provided no relief.  Pt reports she tripped over her walker last night and fell.  Pt reports she can not walk today  Past Medical History:  Diagnosis Date  . ASCUS (atypical squamous cells of undetermined significance) on Pap smear   . Atrophic vaginitis   . Chronic insomnia   . Depression   . GERD (gastroesophageal reflux disease)   . Goiter   . History of kidney stones   . Hypercholesteremia   . Hypertension   . LGSIL (low grade squamous intraepithelial dysplasia)   . Osteopenia   . Ovarian cyst    "shrank it"  . Pancreatitis   . Scoliosis   . Spinal arthritis     Patient Active Problem List   Diagnosis Date Noted  . Overweight (BMI 25.0-29.9) 01/21/2014  . Postoperative anemia due to acute blood loss 01/21/2014  . S/P left TKA 01/19/2014  . Scoliosis   . Osteopenia   . Chronic insomnia   . Atrophic vaginitis   . Spinal arthritis   . GERD (gastroesophageal reflux disease)   . ASCUS (atypical squamous cells of undetermined significance) on Pap smear   . Ovarian cyst   . LGSIL (low grade squamous intraepithelial dysplasia)     Past Surgical History:  Procedure Laterality Date  . ABDOMINAL HYSTERECTOMY  1977   partial  . KIDNEY STONE SURGERY  1990's   2-3 stones x1 surgery  . TONSILLECTOMY  as teenager  . TOTAL KNEE ARTHROPLASTY Left 01/19/2014   Procedure: LEFT TOTAL KNEE  ARTHROPLASTY;  Surgeon: Shelda Pal, MD;  Location: [facility];  Service: Orthopedics;  Laterality: Left;  . WISDOM TOOTH EXTRACTION      OB History    Gravida Para Term Preterm AB Living   1 1       1    SAB TAB Ectopic Multiple Live Births           1       Home Medications    Prior to Admission medications   Medication Sig Start Date End Date Taking? Authorizing Provider  aspirin 81 MG chewable tablet Chew 1 tablet (81 mg total) by mouth every other day. 01/21/14   Lanney Gins, PA-C  atorvastatin (LIPITOR) 10 MG tablet Take 10 mg by mouth daily.    [provider]  calcium-vitamin D (OSCAL WITH D) 500-200 MG-UNIT per tablet Take 1 tablet by mouth 2 (two) times daily.    [provider]  carboxymethylcellulose (REFRESH PLUS) 0.5 % SOLN Place 1 drop into both eyes as needed (Dry eyes).    [provider]  cetirizine (ZYRTEC) 10 MG tablet Take 10 mg by mouth as needed for allergies.    [provider]  colesevelam [facility]) 625 MG tablet Take 625 mg by mouth every Monday, Wednesday, and Friday.    [provider]  docusate sodium 100 MG CAPS Take 100  mg by mouth 2 (two) times daily. 01/21/14   Lanney GinsBabish, Matthew, PA-C  ferrous sulfate 325 (65 FE) MG tablet Take 1 tablet (325 mg total) by mouth 3 (three) times daily after meals. 01/21/14   Lanney GinsBabish, Matthew, PA-C  mirtazapine (REMERON) 15 MG tablet Take 15 mg by mouth at bedtime.    [provider]  Multiple Vitamin (MULTIVITAMIN WITH MINERALS) TABS tablet Take 1 tablet by mouth daily.    [provider]  olmesartan-hydrochlorothiazide (BENICAR HCT) 40-25 MG per tablet Take 1 tablet by mouth every morning.    [provider]  omeprazole (PRILOSEC) 20 MG capsule Take 20 mg by mouth daily.    [provider]  oxyCODONE (OXY IR/ROXICODONE) 5 MG immediate release tablet Take 1-3 tablets (5-15 mg total) by mouth every 4 (four) hours as needed for severe pain. 01/21/14    Lanney GinsBabish, Matthew, PA-C  oxyCODONE-acetaminophen (PERCOCET/ROXICET) 5-325 MG per tablet Take 1 tablet by mouth every 4 (four) hours as needed for severe pain (for pain). 06/17/14   Molpus, John, MD  polyethylene glycol (MIRALAX / GLYCOLAX) packet Take 17 g by mouth 2 (two) times daily. 01/21/14   Lanney GinsBabish, Matthew, PA-C  rivaroxaban (XARELTO) 10 MG TABS tablet Take 1 tablet (10 mg total) by mouth daily. 01/21/14   Lanney GinsBabish, Matthew, PA-C  tiZANidine (ZANAFLEX) 4 MG tablet Take 1 tablet (4 mg total) by mouth every 6 (six) hours as needed. 01/21/14   Lanney GinsBabish, Matthew, PA-C  zolpidem (AMBIEN) 10 MG tablet Take 10 mg by mouth at bedtime.    [provider]    Family History Family History  Problem Relation Age of Onset  . Hypertension Father     Social History Social History   Tobacco Use  . Smoking status: Former Smoker    Years: 20.00    Types: Cigarettes    Last attempt to quit: 07/03/1978    Years since quitting: 38.9  . Smokeless tobacco: Never Used  Substance Use Topics  . Alcohol use: Yes    Comment: social  . Drug use: No     Allergies   Aspirin; Codeine; and Sulfa antibiotics   Review of Systems Review of Systems  All other systems reviewed and are negative.    Physical Exam Updated Vital Signs BP (!) 141/56 (BP Location: Left Arm)   Pulse 88   Temp 98 F (36.7 C) (Oral)   Resp 20   Ht 5\' 5"  (1.651 m)   Wt 74.8 kg (165 lb)   SpO2 96%   BMI 27.46 kg/m   Physical Exam  Constitutional: She appears well-developed and well-nourished. No distress.  HENT:  Head: Normocephalic and atraumatic.  Eyes: Conjunctivae are normal.  Neck: Neck supple.  Cardiovascular: Normal rate and regular rhythm.  No murmur heard. Pulmonary/Chest: Effort normal and breath sounds normal. No respiratory distress.  Abdominal: Soft. There is no tenderness.  Musculoskeletal: She exhibits tenderness and deformity. She exhibits no edema.  Swollen thigh,  Tender above knee,  nv  Intact.    Neurological: She is alert.  Skin: Skin is warm and dry.  Psychiatric: She has a normal mood and affect.  Nursing note and vitals reviewed.    ED Treatments / Results  Labs (all labs ordered are listed, but only abnormal results are displayed) Labs Reviewed - No data to display  EKG  EKG Interpretation None       Radiology No results found.  Procedures Procedures (including critical care time)  Medications Ordered in ED  Medications - No data to display   Initial Impression / Assessment and Plan / ED Course  I have reviewed the triage vital signs and the nursing notes.  Pertinent labs & imaging results that were available during my care of the patient were reviewed by me and considered in my medical decision making (see chart for details).    Call to Orthopaedist.  Pt is followed by Dr. Charlann Boxerlin.   I spoke to dr. Rennis ChrisSupple who requested pt be placed in a knee immobilizer.  He request hospitalist to admit  Consult hospitalist for admissin  Final Clinical Impressions(s) / ED Diagnoses   Final diagnoses:  Closed fracture of left femur, unspecified fracture morphology, unspecified portion of femur, initial encounter [facility](HCC)    ED Discharge Orders    None       Osie CheeksSofia, Leslie K, PA-C 06/05/17 1853    Charlynne PanderYao, David Hsienta, MD 06/05/17 2159

## 2017-06-06 ENCOUNTER — Inpatient Hospital Stay (HOSPITAL_COMMUNITY): Payer: Medicare Other

## 2017-06-06 DIAGNOSIS — E663 Overweight: Secondary | ICD-10-CM

## 2017-06-06 DIAGNOSIS — S7292XA Unspecified fracture of left femur, initial encounter for closed fracture: Secondary | ICD-10-CM

## 2017-06-06 LAB — BASIC METABOLIC PANEL
Anion gap: 4 — ABNORMAL LOW (ref 5–15)
BUN: 25 mg/dL — AB (ref 6–20)
CALCIUM: 9.1 mg/dL (ref 8.9–10.3)
CO2: 26 mmol/L (ref 22–32)
Chloride: 110 mmol/L (ref 101–111)
Creatinine, Ser: 1.43 mg/dL — ABNORMAL HIGH (ref 0.44–1.00)
GFR calc Af Amer: 38 mL/min — ABNORMAL LOW (ref >60)
GFR, EST NON AFRICAN AMERICAN: 33 mL/min — AB (ref >60)
GLUCOSE: 141 mg/dL — AB (ref 65–99)
POTASSIUM: 4.2 mmol/L (ref 3.5–5.1)
Sodium: 140 mmol/L (ref 135–145)

## 2017-06-06 LAB — CBC
HEMATOCRIT: 23.2 % — AB (ref 36.0–46.0)
Hemoglobin: 7.4 g/dL — ABNORMAL LOW (ref 12.0–15.0)
MCH: 28.8 pg (ref 26.0–34.0)
MCHC: 31.9 g/dL (ref 30.0–36.0)
MCV: 90.3 fL (ref 78.0–100.0)
Platelets: 180 10*3/uL (ref 150–400)
RBC: 2.57 MIL/uL — ABNORMAL LOW (ref 3.87–5.11)
RDW: 13.9 % (ref 11.5–15.5)
WBC: 7.6 10*3/uL (ref 4.0–10.5)

## 2017-06-06 LAB — CREATININE, URINE, RANDOM: CREATININE, URINE: 114.95 mg/dL

## 2017-06-06 LAB — PREPARE RBC (CROSSMATCH)

## 2017-06-06 LAB — GLUCOSE, CAPILLARY: GLUCOSE-CAPILLARY: 136 mg/dL — AB (ref 65–99)

## 2017-06-06 MED ORDER — CHLORHEXIDINE GLUCONATE 4 % EX LIQD: 60.0000 mL | Freq: Once | CUTANEOUS | Status: DC

## 2017-06-06 MED ORDER — CEFAZOLIN SODIUM-DEXTROSE 2-4 GM/100ML-% IV SOLN: 2.0000 g | INTRAVENOUS | Status: DC

## 2017-06-06 MED ORDER — SENNA 8.6 MG PO TABS
1.0000 | ORAL_TABLET | Freq: Two times a day (BID) | ORAL | Status: DC
  Administered 2017-06-07: 8.6 mg via ORAL
  Filled 2017-06-06 (×4): qty 1

## 2017-06-06 MED ORDER — SIMETHICONE 80 MG PO CHEW
160.0000 mg | CHEWABLE_TABLET | Freq: Once | ORAL | Status: AC
  Administered 2017-06-06: 160 mg via ORAL
  Filled 2017-06-06: qty 2

## 2017-06-06 MED ORDER — SODIUM CHLORIDE 0.9 % IV SOLN
Freq: Once | INTRAVENOUS | Status: AC
  Administered 2017-06-06: 17:00:00 via INTRAVENOUS

## 2017-06-06 MED ORDER — HEPARIN SODIUM (PORCINE) 5000 UNIT/ML IJ SOLN
5000.0000 [IU] | Freq: Three times a day (TID) | INTRAMUSCULAR | Status: DC
  Administered 2017-06-06 – 2017-06-07 (×2): 5000 [IU] via SUBCUTANEOUS
  Filled 2017-06-06 (×2): qty 1

## 2017-06-06 NOTE — Progress Notes (Signed)
PROGRESS NOTE  Nancy Suarez NGE:[id number] DOB: 08/30/1931 DOA: 06/05/2017 PCP: System, Pcp Not In  HPI/Recap of past 24 hours: Nancy Suarez is a 83 y.o. female with medical history significant of hypertension, hyperlipidemia, GERD, pancreatitis, iron deficiency anemia, CKD-3, who presents after a mechanical fall and left hip pain.  Patient states that she tripped over her husband's walker at home and falling injuring her left leg the night of 06/05/17. She is S/P of left total knee replacement 2-3 years ago by Dr Durene RomansMatthew Olin.  Orthopedic surgery consulted and plans for left distal femur surgical repair on 06/07/17.   No acute events overnight.  Patient was seen and examined with no family member at her bedside. She denies any pain when she does not move. States she lives at home with her husband who is worse off medically than she is and will not be able to help take care of her. She is agreeable to SNF for rehab after her surgery.     Assessment/Plan: Principal Problem:   Closed fracture of left distal femur (HCC) Active Problems:   GERD (gastroesophageal reflux disease)   Overweight (BMI 25.0-29.9)   Essential hypertension   HLD (hyperlipidemia)   Depression   Iron deficiency anemia   Acute renal failure superimposed on stage 3 chronic kidney disease (HCC)   Fall   Closed fracture of left distal femur 2/2. To mechanical fall: -closed fracture of left distal femur -Orthopedic surgeon Dr. Ranell PatrickNorris possible surgical intervention tomorrow 06/07/17 -robaxin for muscle spasms -type and cross  AKI on CKD 3 -baseline cr 1.3 -cr 1.42 from 1.82 -avoid nephrotoxic meds -avoid dehydration/hypotension -IV fluid hydration -BMP am  Leukocytosis, resolved:  -most likely demargination from recent fracture -wbc 7 from 11.6 -CBC am  GERD: -stable -Protonix  HTN:  -BP is stable -Hold Lasix, Benicar, HCTZ due to worsening renal function -Start amlodipine -IV  hydralazine prn  HLD: -Lipitor  Depression:  -Stable -Mirtazapine  GERD: -stable -Protonix  Symptomatic Iron deficiency anemia:  -hg 7.4 from 9.3 -baseline hg 9 -type and screen -transfuse 2U PRBCs -iron supplement -CBC am   Code Status: Full  Family Communication: No family member at bedside  Disposition Plan: Will stay another midnight for possible orthopedic surgery in the am.   Consultants:  Orthopedic surgery  Procedures:  Planned surgical repair of left distal femur tomorrow 06/07/17  Antimicrobials:  None   DVT prophylaxis:  Heparin sq 5000 u TID   Objective: Vitals:   06/05/17 1933 06/05/17 2000 06/05/17 2159 06/06/17 0602  BP: 140/76 126/66 (!) 147/46 (!) 139/53  Pulse: 88 84 83 90  Resp: 18 18 17 18   Temp:   98 F (36.7 C) 99.1 F (37.3 C)  TempSrc:   Oral Oral  SpO2: 100% 100% 98% 100%  Weight:      Height:        Intake/Output Summary (Last 24 hours) at 06/06/2017 1020 Last data filed at 06/06/2017 0602 Gross per 24 hour  Intake 600 ml  Output 600 ml  Net 0 ml   Filed Weights   06/05/17 1726  Weight: 74.8 kg (165 lb)    Exam:   General:  83 yo AAF WD WN NAD  Cardiovascular: RRR N rubs or gallops  Respiratory: CTA no wheezes or rales  Abdomen: Soft NT ND NBS x4  Musculoskeletal: left lower extremity is shorter and externally rotated  Skin: No noted open lesions  Psychiatry: Mood is appropriate for condition and setting.  Data Reviewed: CBC: Recent Labs  Lab 06/05/17 1817 06/06/17 0600  WBC 11.1* 7.6  NEUTROABS 9.5*  --   HGB 9.3* 7.4*  HCT 28.9* 23.2*  MCV 90.3 90.3  PLT 245 180   Basic Metabolic Panel: Recent Labs  Lab 06/05/17 1817 06/06/17 0600  NA 142 140  K 4.0 4.2  CL 111 110  CO2 26 26  GLUCOSE 114* 141*  BUN 30* 25*  CREATININE 1.82* 1.43*  CALCIUM 9.4 9.1   GFR: Estimated Creatinine Clearance: 30.2 mL/min (A) (by C-G formula based on SCr of 1.43 mg/dL (H)). Liver Function  Tests: Recent Labs  Lab 06/05/17 1817  AST 24  ALT 19  ALKPHOS 70  BILITOT 0.6  PROT 6.7  ALBUMIN 3.9   No results for input(s): LIPASE, AMYLASE in the last 168 hours. No results for input(s): AMMONIA in the last 168 hours. Coagulation Profile: Recent Labs  Lab 06/05/17 2215  INR 1.08   Cardiac Enzymes: No results for input(s): CKTOTAL, CKMB, CKMBINDEX, TROPONINI in the last 168 hours. BNP (last 3 results) No results for input(s): PROBNP in the last 8760 hours. HbA1C: No results for input(s): HGBA1C in the last 72 hours. CBG: Recent Labs  Lab 06/06/17 0953  GLUCAP 136*   Lipid Profile: No results for input(s): CHOL, HDL, LDLCALC, TRIG, CHOLHDL, LDLDIRECT in the last 72 hours. Thyroid Function Tests: No results for input(s): TSH, T4TOTAL, FREET4, T3FREE, THYROIDAB in the last 72 hours. Anemia Panel: No results for input(s): VITAMINB12, FOLATE, FERRITIN, TIBC, IRON, RETICCTPCT in the last 72 hours. Urine analysis:    Component Value Date/Time   COLORURINE YELLOW 01/12/2014 1537   APPEARANCEUR CLEAR 01/12/2014 1537   LABSPEC 1.024 01/12/2014 1537   PHURINE 5.0 01/12/2014 1537   GLUCOSEU NEGATIVE 01/12/2014 1537   HGBUR NEGATIVE 01/12/2014 1537   BILIRUBINUR NEGATIVE 01/12/2014 1537   KETONESUR NEGATIVE 01/12/2014 1537   PROTEINUR NEGATIVE 01/12/2014 1537   UROBILINOGEN 0.2 01/12/2014 1537   NITRITE NEGATIVE 01/12/2014 1537   LEUKOCYTESUR SMALL (A) 01/12/2014 1537   Sepsis Labs: @LABRCNTIP (procalcitonin:4,lacticidven:4)  )No results found for this or any previous visit (from the past 240 hour(s)).    Studies: Dg Knee 1-2 Views Left  Result Date: 06/05/2017 CLINICAL DATA:  Recent fall with distal thigh pain, initial encounter EXAM: LEFT KNEE - 1-2 VIEW COMPARISON:  None. FINDINGS: There is a comminuted fracture of the distal left femur involving the junction of the diaphysis and metaphysis. The fracture lines appear to extend to the level of the femoral  prosthesis. Some mild posterior displacement of the distal fracture fragments is noted. IMPRESSION: Comminuted distal femoral fracture which appears to extend towards the femoral portion of the knee prosthesis. Electronically Signed   By: Alcide Clever M.D.   On: 06/05/2017 18:19   Dg Hip Unilat W Or Wo Pelvis 2-3 Views Left  Result Date: 06/05/2017 CLINICAL DATA:  Recent fall with left leg pain, initial encounter EXAM: DG HIP (WITH OR WITHOUT PELVIS) 3V LEFT COMPARISON:  None. FINDINGS: Degenerative changes of lumbar spine are noted. The pelvic ring is intact. Mild degenerative changes of the hip joints are noted bilaterally. No acute fracture is noted. No soft tissue changes are seen. IMPRESSION: Degenerative change without acute abnormality. Electronically Signed   By: Alcide Clever M.D.   On: 06/05/2017 18:20   Dg Femur Min 2 Views Left  Result Date: 06/05/2017 CLINICAL DATA:  Recent fall with left leg pain, initial encounter EXAM: LEFT FEMUR 2 VIEWS COMPARISON:  None. FINDINGS: Proximal femur is within normal limits. Comminuted distal femoral fracture is noted at the junction of the diaphysis and metaphysis with extension towards the femoral portion of the knee prosthesis. Some posterior displacement of the distal fracture fragments is noted. No other focal abnormality is seen. IMPRESSION: Comminuted distal left femoral fracture as described. Electronically Signed   By: Alcide CleverMark  Lukens M.D.   On: 06/05/2017 18:19    Scheduled Meds: . amLODipine  5 mg Oral Daily  . atorvastatin  20 mg Oral QPM  . calcium-vitamin D  1 tablet Oral BID WC  . chlorhexidine  60 mL Topical Once  . colesevelam  625 mg Oral Q M,W,F  . docusate sodium  100 mg Oral BID  . ferrous sulfate  325 mg Oral TID PC  . loratadine  10 mg Oral Daily  . mirtazapine  15 mg Oral QHS  . multivitamin with minerals  1 tablet Oral Daily  . pantoprazole  40 mg Oral Daily  . zolpidem  5 mg Oral QHS    Continuous Infusions: . sodium  chloride 75 mL/hr at 06/06/17 0916  .  ceFAZolin (ANCEF) IV       LOS: 1 day     Darlin Droparole N Britiany Silbernagel, MD [facility] Pager [phone]  If 7PM-7AM, please contact night-coverage [email or web address] Password [email or web address] 06/06/2017, 10:20 AM

## 2017-06-06 NOTE — Progress Notes (Signed)
Patient called out experiencing abdominal pain the patient referred to as "gas" pain. Patient attempting to move bowels on bedpan but reports feeling cramping. Digitally checked, no stool in rectum. Bowel sounds very active. Patient requested "something for gas" and prune juice. Provided juice and paged MD.  Patient also expressing anxiety r/t blood transfusion. Patient verbalizing fear r/t experience her mother had. Discussed with patient about blood transfusion but patient requesting to speak with the doctor before agreeing to transfusion. MD notified.

## 2017-06-06 NOTE — Progress Notes (Signed)
Patient ID: Nancy Suarez, female   DOB: 04/15/1932, 83 y.o.   MRN: [id number] Subjective:     Doing ok this am, anticipating surgical correction of left femur fracture  Patient reports pain as moderate with movement  Objective:   VITALS:   Vitals:   06/05/17 2159 06/06/17 0602  BP: (!) 147/46 (!) 139/53  Pulse: 83 90  Resp: 17 18  Temp: 98 F (36.7 C) 99.1 F (37.3 C)  SpO2: 98% 100%    Neurovascular intact  LABS Recent Labs    06/05/17 1817 06/06/17 0600  HGB 9.3* 7.4*  HCT 28.9* 23.2*  WBC 11.1* 7.6  PLT 245 180    Recent Labs    06/05/17 1817 06/06/17 0600  NA 142 140  K 4.0 4.2  BUN 30* 25*  CREATININE 1.82* 1.43*  GLUCOSE 114* 141*    Recent Labs    06/05/17 2215  INR 1.08     Assessment/Plan:Doin    1. Left closed distal periprosthetic femur fracture  Unable to get it addressed today will try tomorrow pm Changed diet status accordingly Consent ordered

## 2017-06-07 DIAGNOSIS — F339 Major depressive disorder, recurrent, unspecified: Secondary | ICD-10-CM

## 2017-06-07 DIAGNOSIS — D62 Acute posthemorrhagic anemia: Secondary | ICD-10-CM

## 2017-06-07 LAB — BASIC METABOLIC PANEL
ANION GAP: 4 — AB (ref 5–15)
BUN: 16 mg/dL (ref 6–20)
CALCIUM: 8.5 mg/dL — AB (ref 8.9–10.3)
CHLORIDE: 110 mmol/L (ref 101–111)
CO2: 24 mmol/L (ref 22–32)
CREATININE: 1.21 mg/dL — AB (ref 0.44–1.00)
GFR calc non Af Amer: 40 mL/min — ABNORMAL LOW (ref >60)
GFR, EST AFRICAN AMERICAN: 47 mL/min — AB (ref >60)
Glucose, Bld: 126 mg/dL — ABNORMAL HIGH (ref 65–99)
Potassium: 4 mmol/L (ref 3.5–5.1)
SODIUM: 138 mmol/L (ref 135–145)

## 2017-06-07 LAB — CBC
HCT: 19.2 % — ABNORMAL LOW (ref 36.0–46.0)
HEMOGLOBIN: 6.3 g/dL — AB (ref 12.0–15.0)
MCH: 29.6 pg (ref 26.0–34.0)
MCHC: 32.8 g/dL (ref 30.0–36.0)
MCV: 90.1 fL (ref 78.0–100.0)
Platelets: 130 10*3/uL — ABNORMAL LOW (ref 150–400)
RBC: 2.13 MIL/uL — ABNORMAL LOW (ref 3.87–5.11)
RDW: 14 % (ref 11.5–15.5)
WBC: 8.4 10*3/uL (ref 4.0–10.5)

## 2017-06-07 LAB — UREA NITROGEN, URINE: UREA NITROGEN UR: 591 mg/dL

## 2017-06-07 LAB — GLUCOSE, CAPILLARY: GLUCOSE-CAPILLARY: 114 mg/dL — AB (ref 65–99)

## 2017-06-07 LAB — HEMOGLOBIN AND HEMATOCRIT, BLOOD
HCT: 25.7 % — ABNORMAL LOW (ref 36.0–46.0)
HEMOGLOBIN: 8.6 g/dL — AB (ref 12.0–15.0)

## 2017-06-07 LAB — PREPARE RBC (CROSSMATCH)

## 2017-06-07 MED ORDER — SODIUM CHLORIDE 0.9 % IV SOLN: Freq: Once | INTRAVENOUS | Status: DC

## 2017-06-07 MED ORDER — SIMETHICONE 80 MG PO CHEW
80.0000 mg | CHEWABLE_TABLET | Freq: Four times a day (QID) | ORAL | Status: DC | PRN
  Administered 2017-06-07 – 2017-06-12 (×11): 80 mg via ORAL
  Filled 2017-06-07 (×11): qty 1

## 2017-06-07 NOTE — Progress Notes (Signed)
Patient ID: Nancy MinorRoxana R Baylock, female   DOB: 07/15/1931, 83 y.o.   MRN: [id number] Subjective:    Patient relatively comfortable given predicament.  Anxious about prospect of receiving blood  Patient reports pain as mild to moderate depending on movement  Objective:   VITALS:   Vitals:   06/07/17 0646 06/07/17 0901  BP: (!) 118/41 (!) 142/46  Pulse: 89 93  Resp: 18 17  Temp: 99.5 F (37.5 C)   SpO2: 99% 100%    Neurovascular intact  LABS Recent Labs    06/05/17 1817 06/06/17 0600 06/07/17 0535  HGB 9.3* 7.4* 6.3*  HCT 28.9* 23.2* 19.2*  WBC 11.1* 7.6 8.4  PLT 245 180 130*    Recent Labs    06/05/17 1817 06/06/17 0600 06/07/17 0535  NA 142 140 138  K 4.0 4.2 4.0  BUN 30* 25* 16  CREATININE 1.82* 1.43* 1.21*  GLUCOSE 114* 141* 126*    Recent Labs    06/05/17 2215  INR 1.08     Assessment/Plan:    Comminuted left distal periprosthetic femur fracture ABLA  Plan: Cancel surgery today Likely to add on Saturday am if we get Hgb worked out Hold anticoagulation for now due to high risk of bleed due to femur fracture Bilateral SCDs for DVT prohylaxis  2 units PRBCs today. Repeat CBC in am and possibly needing 2 more units Friday in prep for OR Saturday with more in blood bank  Will follow but plan for OR Saturday unless plan necessitates change

## 2017-06-07 NOTE — Progress Notes (Signed)
PROGRESS NOTE  Nancy MinorRoxana R Delay ZOX:[id number] DOB: 09/02/1931 DOA: 06/05/2017 PCP: System, Pcp Not In  HPI/Recap of past 24 hours: Nancy Suarez is a 83 y.o. female with medical history significant of hypertension, hyperlipidemia, GERD, pancreatitis, iron deficiency anemia, CKD-3, who presents after a mechanical fall and left hip pain.  Patient states that she tripped over her husband's walker at home and falling injuring her left leg the night of 06/05/17. She is S/P of left total knee replacement 2-3 years ago by Dr Durene RomansMatthew Olin.  Orthopedic surgery consulted and plans for left distal femur surgical repair on 06/08/17. She is agreeable to SNF for rehab after her surgery.  Patient refused blood transfusion yesterday 06/06/17. This morning Hg 6.3 and surgery cancelled. Accepted blood transfusion today 06/07/17.  Patient was seen and examined with no family member at her bedside. She denies any pain when she does not move.    Assessment/Plan: Principal Problem:   Closed fracture of left distal femur (HCC) Active Problems:   GERD (gastroesophageal reflux disease)   Overweight (BMI 25.0-29.9)   Essential hypertension   HLD (hyperlipidemia)   Depression   Iron deficiency anemia   Acute renal failure superimposed on stage 3 chronic kidney disease (HCC)   Fall   Closed fracture of left distal femur 2/2. To mechanical fall: -closed fracture of left distal femur -Orthopedic surgeon Dr. Ranell PatrickNorris possible surgical intervention tomorrow 06/08/17 -robaxin for muscle spasms -blood transfusion 2 U PRBCs 06/07/17  Symptomatic acute on chronic Iron deficiency anemia:  -possible acute blood loss from fracture -hg 6.3 from 7.4 from 9.3 -baseline hg 9 -patient refused blood transfusion yesterday 06/06/17 -transfuse 2U PRBCs -continue iron supplement -CBC am  AKI on CKD 3, resolved -improving -cr 1.21 from 1.42 from 1.82 -baseline cr 1.2 -avoid nephrotoxic meds -avoid  dehydration/hypotension -IV fluid hydration -BMP am  Leukocytosis, resolved:  -most likely demargination from recent fracture -CBC am  GERD: -stable -Protonix  HTN:  -BP is stable -Hold Lasix, Benicar, HCTZ due to worsening renal function -Start amlodipine -IV hydralazine prn  HLD: -Lipitor  Depression:  -Stable -Mirtazapine  GERD: -stable -Protonix   Code Status: Full  Family Communication: No family member at bedside  Disposition Plan: Will stay another midnight for possible orthopedic surgery in the am.   Consultants:  Orthopedic surgery  Procedures:  Planned surgical repair of left distal femur tomorrow 06/07/17  Antimicrobials:  None   DVT prophylaxis:  Heparin sq 5000 u TID   Objective: Vitals:   06/07/17 0901 06/07/17 1000 06/07/17 1033 06/07/17 1310  BP: (!) 142/46 (!) 125/42 (!) 130/44 (!) 122/44  Pulse: 93 88 85 84  Resp: 17 16 16 16   Temp: 98.9 F (37.2 C) 98.5 F (36.9 C) 99.8 F (37.7 C) 97.6 F (36.4 C)  TempSrc: Oral Oral    SpO2: 100% 100% 100% 99%  Weight:      Height:        Intake/Output Summary (Last 24 hours) at 06/07/2017 1506 Last data filed at 06/07/2017 1319 Gross per 24 hour  Intake 2799.17 ml  Output 1200 ml  Net 1599.17 ml   Filed Weights   06/05/17 1726  Weight: 74.8 kg (165 lb)    Exam:   General:  83 yo AAF WD WN NAD  Cardiovascular: RRR N rubs or gallops  Respiratory: CTA no wheezes or rales  Abdomen: Soft NT ND NBS x4  Musculoskeletal: left lower extremity is shorter and externally rotated  Skin: No noted open  lesions  Psychiatry: Mood is appropriate for condition and setting.   Data Reviewed: CBC: Recent Labs  Lab 06/05/17 1817 06/06/17 0600 06/07/17 0535  WBC 11.1* 7.6 8.4  NEUTROABS 9.5*  --   --   HGB 9.3* 7.4* 6.3*  HCT 28.9* 23.2* 19.2*  MCV 90.3 90.3 90.1  PLT 245 180 130*   Basic Metabolic Panel: Recent Labs  Lab 06/05/17 1817 06/06/17 0600 06/07/17 0535   NA 142 140 138  K 4.0 4.2 4.0  CL 111 110 110  CO2 26 26 24   GLUCOSE 114* 141* 126*  BUN 30* 25* 16  CREATININE 1.82* 1.43* 1.21*  CALCIUM 9.4 9.1 8.5*   GFR: Estimated Creatinine Clearance: 35.6 mL/min (A) (by C-G formula based on SCr of 1.21 mg/dL (H)). Liver Function Tests: Recent Labs  Lab 06/05/17 1817  AST 24  ALT 19  ALKPHOS 70  BILITOT 0.6  PROT 6.7  ALBUMIN 3.9   No results for input(s): LIPASE, AMYLASE in the last 168 hours. No results for input(s): AMMONIA in the last 168 hours. Coagulation Profile: Recent Labs  Lab 06/05/17 2215  INR 1.08   Cardiac Enzymes: No results for input(s): CKTOTAL, CKMB, CKMBINDEX, TROPONINI in the last 168 hours. BNP (last 3 results) No results for input(s): PROBNP in the last 8760 hours. HbA1C: No results for input(s): HGBA1C in the last 72 hours. CBG: Recent Labs  Lab 06/06/17 0953 06/07/17 0712  GLUCAP 136* 114*   Lipid Profile: No results for input(s): CHOL, HDL, LDLCALC, TRIG, CHOLHDL, LDLDIRECT in the last 72 hours. Thyroid Function Tests: No results for input(s): TSH, T4TOTAL, FREET4, T3FREE, THYROIDAB in the last 72 hours. Anemia Panel: No results for input(s): VITAMINB12, FOLATE, FERRITIN, TIBC, IRON, RETICCTPCT in the last 72 hours. Urine analysis:    Component Value Date/Time   COLORURINE YELLOW 01/12/2014 1537   APPEARANCEUR CLEAR 01/12/2014 1537   LABSPEC 1.024 01/12/2014 1537   PHURINE 5.0 01/12/2014 1537   GLUCOSEU NEGATIVE 01/12/2014 1537   HGBUR NEGATIVE 01/12/2014 1537   BILIRUBINUR NEGATIVE 01/12/2014 1537   KETONESUR NEGATIVE 01/12/2014 1537   PROTEINUR NEGATIVE 01/12/2014 1537   UROBILINOGEN 0.2 01/12/2014 1537   NITRITE NEGATIVE 01/12/2014 1537   LEUKOCYTESUR SMALL (A) 01/12/2014 1537   Sepsis Labs: @LABRCNTIP (procalcitonin:4,lacticidven:4)  )No results found for this or any previous visit (from the past 240 hour(s)).    Studies: No results found.  Scheduled Meds: . amLODipine  5  mg Oral Daily  . atorvastatin  20 mg Oral QPM  . calcium-vitamin D  1 tablet Oral BID WC  . chlorhexidine  60 mL Topical Once  . colesevelam  625 mg Oral Q M,W,F  . docusate sodium  100 mg Oral BID  . ferrous sulfate  325 mg Oral TID PC  . heparin injection (subcutaneous)  5,000 Units Subcutaneous Q8H  . loratadine  10 mg Oral Daily  . mirtazapine  15 mg Oral QHS  . multivitamin with minerals  1 tablet Oral Daily  . pantoprazole  40 mg Oral Daily  . senna  1 tablet Oral BID  . zolpidem  5 mg Oral QHS    Continuous Infusions: . sodium chloride 75 mL/hr at 06/06/17 1739  . sodium chloride    .  ceFAZolin (ANCEF) IV       LOS: 2 days     Darlin Droparole N Meriel Kelliher, MD [facility] Pager [phone]  If 7PM-7AM, please contact night-coverage [email or web address] Password [email or web address] 06/07/2017, 3:06 PM

## 2017-06-07 NOTE — Progress Notes (Signed)
CRITICAL VALUE ALERT  Critical Value: Hemoglobin 6.3  Date & Time Notied: 06/07/2017 0630  Provider Notified: K.Kirby  Orders Received/Actions taken: Patient previously had 2u of PRBCs that she refused on 12/5. Provider notified of this information. Patient has agreed to transfusion at this time. Dr. Charlann Boxerlin ortho has been in to talk with patient regarding transfusion as well.

## 2017-06-08 LAB — PREPARE RBC (CROSSMATCH)

## 2017-06-08 LAB — BASIC METABOLIC PANEL
ANION GAP: 5 (ref 5–15)
BUN: 15 mg/dL (ref 6–20)
CALCIUM: 8.8 mg/dL — AB (ref 8.9–10.3)
CO2: 24 mmol/L (ref 22–32)
Chloride: 109 mmol/L (ref 101–111)
Creatinine, Ser: 1.14 mg/dL — ABNORMAL HIGH (ref 0.44–1.00)
GFR calc non Af Amer: 43 mL/min — ABNORMAL LOW (ref >60)
GFR, EST AFRICAN AMERICAN: 50 mL/min — AB (ref >60)
Glucose, Bld: 113 mg/dL — ABNORMAL HIGH (ref 65–99)
Potassium: 4.1 mmol/L (ref 3.5–5.1)
Sodium: 138 mmol/L (ref 135–145)

## 2017-06-08 LAB — CBC
HEMATOCRIT: 23.7 % — AB (ref 36.0–46.0)
HEMOGLOBIN: 7.9 g/dL — AB (ref 12.0–15.0)
MCH: 29.4 pg (ref 26.0–34.0)
MCHC: 33.3 g/dL (ref 30.0–36.0)
MCV: 88.1 fL (ref 78.0–100.0)
Platelets: 129 10*3/uL — ABNORMAL LOW (ref 150–400)
RBC: 2.69 MIL/uL — AB (ref 3.87–5.11)
RDW: 14.2 % (ref 11.5–15.5)
WBC: 9.4 10*3/uL (ref 4.0–10.5)

## 2017-06-08 LAB — GLUCOSE, CAPILLARY: Glucose-Capillary: 100 mg/dL — ABNORMAL HIGH (ref 65–99)

## 2017-06-08 MED ORDER — TRANEXAMIC ACID 1000 MG/10ML IV SOLN
1000.0000 mg | INTRAVENOUS | Status: AC
  Administered 2017-06-09: 1000 mg via INTRAVENOUS
  Filled 2017-06-08: qty 10

## 2017-06-08 MED ORDER — CHLORHEXIDINE GLUCONATE 4 % EX LIQD: 60.0000 mL | Freq: Once | CUTANEOUS | Status: DC

## 2017-06-08 MED ORDER — CEFAZOLIN SODIUM-DEXTROSE 2-4 GM/100ML-% IV SOLN: 2.0000 g | INTRAVENOUS | Status: DC

## 2017-06-08 MED ORDER — FUROSEMIDE 10 MG/ML IJ SOLN
20.0000 mg | Freq: Once | INTRAMUSCULAR | Status: AC
  Administered 2017-06-08: 20 mg via INTRAVENOUS
  Filled 2017-06-08: qty 2

## 2017-06-08 MED ORDER — POLYETHYLENE GLYCOL 3350 17 G PO PACK
17.0000 g | PACK | Freq: Every day | ORAL | Status: DC
  Administered 2017-06-08: 17 g via ORAL
  Filled 2017-06-08: qty 1

## 2017-06-08 MED ORDER — SODIUM CHLORIDE 0.9 % IV SOLN: INTRAVENOUS | Status: DC

## 2017-06-08 MED ORDER — SODIUM CHLORIDE 0.9 % IV SOLN: Freq: Once | INTRAVENOUS | Status: DC

## 2017-06-08 MED ORDER — TRANEXAMIC ACID 1000 MG/10ML IV SOLN
1000.0000 mg | INTRAVENOUS | Status: DC
  Filled 2017-06-08: qty 10

## 2017-06-08 MED ORDER — POVIDONE-IODINE 10 % EX SWAB: 2.0000 "application " | Freq: Once | CUTANEOUS | Status: DC

## 2017-06-08 NOTE — Progress Notes (Signed)
PROGRESS NOTE  Nancy Suarez:[id number] DOB: 1931/10/08 DOA: 06/05/2017 PCP: System, Pcp Not In  HPI/Recap of past 24 hours: Nancy Suarez is a 83 y.o. female with medical history significant of hypertension, hyperlipidemia, GERD, pancreatitis, iron deficiency anemia, CKD-3, who presents after a mechanical fall and left hip pain.  Patient states that she tripped over her husband's walker at home and falling injuring her left leg the night of 06/05/17. She is S/P of left total knee replacement 2-3 years ago by Dr Durene Romans.  Orthopedic surgery consulted and plans for left distal femur surgical repair on 06/08/17. She is agreeable to SNF for rehab after her surgery.  Patient refused blood transfusion 06/06/17. 06/07/17 morning Hg 6.3 and surgery cancelled. Accepted blood transfusion 06/07/17. Hg dropped again 06/08/17 am from 8 to 7. 2 U PRBCs ordered to be transfused for possible surgery tomorrow 06/09/17. FOBT ordered. Left lower extremity edema.    Assessment/Plan: Principal Problem:   Closed fracture of left distal femur (HCC) Active Problems:   GERD (gastroesophageal reflux disease)   Overweight (BMI 25.0-29.9)   Essential hypertension   HLD (hyperlipidemia)   Depression   Iron deficiency anemia   Acute renal failure superimposed on stage 3 chronic kidney disease (HCC)   Fall   Closed fracture of left distal femur 2/2. To mechanical fall: -closed fracture of left distal femur -Orthopedic surgeon Dr. Ranell Patrick possible surgical intervention tomorrow 06/08/17 -robaxin for muscle spasms -blood transfusion 2 U PRBCs 06/07/17 . On 06/08/17 hg 8 from 6 then dropped to 7. -additional 2U PRBCs ordered 06/08/17 -possible surgery 06/09/17 if hg stable  Symptomatic acute on chronic Iron deficiency anemia:  -possible acute blood loss from fracture -hg 6.3 from 7.4 from 9.3 -baseline hg 9 -patient refused blood transfusion 06/06/17 -transfused total 4U PRBCs -continue iron  supplement -CBC am  AKI on CKD 3, resolved -resolving -cr 1.14 from 1.21 from 1.42 from 1.82 -baseline cr 1.1 -avoid nephrotoxic meds -avoid dehydration/hypotension -IV fluid hydration -BMP am  Leukocytosis, resolved:  -most likely demargination from recent fracture -CBC am  GERD: -stable -Protonix  HTN:  -BP is stable -Hold Lasix, Benicar, HCTZ due to worsening renal function -Start amlodipine -IV hydralazine prn  HLD: -Lipitor  Depression:  -Stable -Mirtazapine  GERD: -stable -Protonix   Code Status: Full  Family Communication: No family member at bedside  Disposition Plan: Will stay another midnight for possible orthopedic surgery in the am 06/09/17.   Consultants:  Orthopedic surgery  Procedures:  Planned surgical repair of left distal femur tomorrow 06/09/17  Antimicrobials:  None   DVT prophylaxis:  SCDs per surgery   Objective: Vitals:   06/07/17 1310 06/07/17 2204 06/08/17 0455 06/08/17 1015  BP: (!) 122/44 (!) 117/52 (!) 130/49 (!) 121/50  Pulse: 84 83 90 93  Resp: 16 16 16    Temp: 97.6 F (36.4 C) 99.1 F (37.3 C) 99.2 F (37.3 C)   TempSrc:  Oral Oral   SpO2: 99% 97% 97%   Weight:      Height:        Intake/Output Summary (Last 24 hours) at 06/08/2017 1134 Last data filed at 06/08/2017 0600 Gross per 24 hour  Intake 1354 ml  Output 900 ml  Net 454 ml   Filed Weights   06/05/17 1726  Weight: 74.8 kg (165 lb)    Exam:   General:  83 yo AAF WD WN NAD  Cardiovascular: RRR N rubs or gallops  Respiratory: CTA no wheezes or  rales  Abdomen: Soft NT ND NBS x4  Musculoskeletal: left lower extremity is shorter and externally rotated. Edema noted from left foot to upper thigh.  Skin: No noted open lesions  Psychiatry: Mood is appropriate for condition and setting.   Data Reviewed: CBC: Recent Labs  Lab 06/05/17 1817 06/06/17 0600 06/07/17 0535 06/07/17 1445 06/08/17 0515  WBC 11.1* 7.6 8.4  --  9.4   NEUTROABS 9.5*  --   --   --   --   HGB 9.3* 7.4* 6.3* 8.6* 7.9*  HCT 28.9* 23.2* 19.2* 25.7* 23.7*  MCV 90.3 90.3 90.1  --  88.1  PLT 245 180 130*  --  129*   Basic Metabolic Panel: Recent Labs  Lab 06/05/17 1817 06/06/17 0600 06/07/17 0535 06/08/17 0515  NA 142 140 138 138  K 4.0 4.2 4.0 4.1  CL 111 110 110 109  CO2 26 26 24 24   GLUCOSE 114* 141* 126* 113*  BUN 30* 25* 16 15  CREATININE 1.82* 1.43* 1.21* 1.14*  CALCIUM 9.4 9.1 8.5* 8.8*   GFR: Estimated Creatinine Clearance: 37.8 mL/min (A) (by C-G formula based on SCr of 1.14 mg/dL (H)). Liver Function Tests: Recent Labs  Lab 06/05/17 1817  AST 24  ALT 19  ALKPHOS 70  BILITOT 0.6  PROT 6.7  ALBUMIN 3.9   No results for input(s): LIPASE, AMYLASE in the last 168 hours. No results for input(s): AMMONIA in the last 168 hours. Coagulation Profile: Recent Labs  Lab 06/05/17 2215  INR 1.08   Cardiac Enzymes: No results for input(s): CKTOTAL, CKMB, CKMBINDEX, TROPONINI in the last 168 hours. BNP (last 3 results) No results for input(s): PROBNP in the last 8760 hours. HbA1C: No results for input(s): HGBA1C in the last 72 hours. CBG: Recent Labs  Lab 06/06/17 0953 06/07/17 0712 06/08/17 0738  GLUCAP 136* 114* 100*   Lipid Profile: No results for input(s): CHOL, HDL, LDLCALC, TRIG, CHOLHDL, LDLDIRECT in the last 72 hours. Thyroid Function Tests: No results for input(s): TSH, T4TOTAL, FREET4, T3FREE, THYROIDAB in the last 72 hours. Anemia Panel: No results for input(s): VITAMINB12, FOLATE, FERRITIN, TIBC, IRON, RETICCTPCT in the last 72 hours. Urine analysis:    Component Value Date/Time   COLORURINE YELLOW 01/12/2014 1537   APPEARANCEUR CLEAR 01/12/2014 1537   LABSPEC 1.024 01/12/2014 1537   PHURINE 5.0 01/12/2014 1537   GLUCOSEU NEGATIVE 01/12/2014 1537   HGBUR NEGATIVE 01/12/2014 1537   BILIRUBINUR NEGATIVE 01/12/2014 1537   KETONESUR NEGATIVE 01/12/2014 1537   PROTEINUR NEGATIVE 01/12/2014 1537    UROBILINOGEN 0.2 01/12/2014 1537   NITRITE NEGATIVE 01/12/2014 1537   LEUKOCYTESUR SMALL (A) 01/12/2014 1537   Sepsis Labs: @LABRCNTIP (procalcitonin:4,lacticidven:4)  )No results found for this or any previous visit (from the past 240 hour(s)).    Studies: No results found.  Scheduled Meds: . amLODipine  5 mg Oral Daily  . atorvastatin  20 mg Oral QPM  . calcium-vitamin D  1 tablet Oral BID WC  . colesevelam  625 mg Oral Q M,W,F  . docusate sodium  100 mg Oral BID  . ferrous sulfate  325 mg Oral TID PC  . furosemide  20 mg Intravenous Once  . loratadine  10 mg Oral Daily  . mirtazapine  15 mg Oral QHS  . multivitamin with minerals  1 tablet Oral Daily  . pantoprazole  40 mg Oral Daily  . polyethylene glycol  17 g Oral Daily  . zolpidem  5 mg Oral QHS  Continuous Infusions: . sodium chloride 75 mL/hr at 06/06/17 1739  . sodium chloride    . sodium chloride       LOS: 3 days     Darlin Droparole N Sowmya Partridge, MD [facility] Pager [phone]  If 7PM-7AM, please contact night-coverage [email or web address] Password [email or web address] 06/08/2017, 11:34 AM

## 2017-06-08 NOTE — Progress Notes (Signed)
Patient ID: Nancy Suarez, female   DOB: 09/11/1931, 83 y.o.   MRN: [id number] Left distal periprosthetic femur fracture ABLA - Hgb 7.9 (8.6 yesterday after 2 units)  Plan to transfuse 2 more units for planned surgery tomorrow NPO Consent on chart to be completed ORIF planned

## 2017-06-08 NOTE — Care Management Important Message (Signed)
Important Message  Patient Details  Name: Nancy Suarez MRN: [id number] Date of Birth: 06/07/1932   Medicare Important Message Given:  Yes    [name], [name] 06/08/2017, 10:11 AMImportant Message  Patient Details  Name: Nancy Suarez MRN: [id number] Date of Birth: 03/06/1932   Medicare Important Message Given:  Yes    [name], [name] 06/08/2017, 10:11 AM

## 2017-06-09 ENCOUNTER — Encounter (HOSPITAL_COMMUNITY)
Admission: EM | Disposition: A | Discharge status: Skilled Nursing Facility | Payer: Self-pay | Source: Home / Self Care | Attending: Internal Medicine

## 2017-06-09 ENCOUNTER — Encounter (HOSPITAL_COMMUNITY): Payer: Self-pay | Admitting: Certified Registered Nurse Anesthetist

## 2017-06-09 ENCOUNTER — Inpatient Hospital Stay (HOSPITAL_COMMUNITY): Payer: Medicare Other | Admitting: Certified Registered Nurse Anesthetist

## 2017-06-09 ENCOUNTER — Inpatient Hospital Stay (HOSPITAL_COMMUNITY): Payer: Medicare Other

## 2017-06-09 DIAGNOSIS — S72402A Unspecified fracture of lower end of left femur, initial encounter for closed fracture: Secondary | ICD-10-CM

## 2017-06-09 HISTORY — PX: ORIF FEMUR FRACTURE: SHX2119

## 2017-06-09 LAB — BASIC METABOLIC PANEL
Anion gap: 8 (ref 5–15)
BUN: 17 mg/dL (ref 6–20)
CHLORIDE: 105 mmol/L (ref 101–111)
CO2: 25 mmol/L (ref 22–32)
CREATININE: 1.15 mg/dL — AB (ref 0.44–1.00)
Calcium: 8.7 mg/dL — ABNORMAL LOW (ref 8.9–10.3)
GFR calc Af Amer: 50 mL/min — ABNORMAL LOW (ref >60)
GFR calc non Af Amer: 43 mL/min — ABNORMAL LOW (ref >60)
Glucose, Bld: 131 mg/dL — ABNORMAL HIGH (ref 65–99)
POTASSIUM: 4.2 mmol/L (ref 3.5–5.1)
Sodium: 138 mmol/L (ref 135–145)

## 2017-06-09 LAB — HEMOGLOBIN AND HEMATOCRIT, BLOOD
HCT: 30.4 % — ABNORMAL LOW (ref 36.0–46.0)
HEMOGLOBIN: 10.3 g/dL — AB (ref 12.0–15.0)

## 2017-06-09 LAB — CBC
HEMATOCRIT: 31.6 % — AB (ref 36.0–46.0)
Hemoglobin: 10.6 g/dL — ABNORMAL LOW (ref 12.0–15.0)
MCH: 29.4 pg (ref 26.0–34.0)
MCHC: 33.5 g/dL (ref 30.0–36.0)
MCV: 87.5 fL (ref 78.0–100.0)
Platelets: 148 10*3/uL — ABNORMAL LOW (ref 150–400)
RBC: 3.61 MIL/uL — AB (ref 3.87–5.11)
RDW: 14.6 % (ref 11.5–15.5)
WBC: 11.3 10*3/uL — ABNORMAL HIGH (ref 4.0–10.5)

## 2017-06-09 LAB — GLUCOSE, CAPILLARY: GLUCOSE-CAPILLARY: 124 mg/dL — AB (ref 65–99)

## 2017-06-09 LAB — MRSA PCR SCREENING: MRSA by PCR: NEGATIVE

## 2017-06-09 SURGERY — OPEN REDUCTION INTERNAL FIXATION (ORIF) DISTAL FEMUR FRACTURE
Anesthesia: General | Site: Knee | Laterality: Left

## 2017-06-09 MED ORDER — OXYCODONE HCL 5 MG/5ML PO SOLN
5.0000 mg | Freq: Once | ORAL | Status: DC | PRN
  Filled 2017-06-09: qty 5

## 2017-06-09 MED ORDER — ONDANSETRON HCL 4 MG/2ML IJ SOLN: 4.0000 mg | Freq: Four times a day (QID) | INTRAMUSCULAR | Status: DC | PRN

## 2017-06-09 MED ORDER — FENTANYL CITRATE (PF) 250 MCG/5ML IJ SOLN
INTRAMUSCULAR | Status: AC
  Filled 2017-06-09: qty 5

## 2017-06-09 MED ORDER — ACETAMINOPHEN 10 MG/ML IV SOLN
1000.0000 mg | Freq: Once | INTRAVENOUS | Status: AC
  Administered 2017-06-09: 1000 mg via INTRAVENOUS

## 2017-06-09 MED ORDER — ACETAMINOPHEN 10 MG/ML IV SOLN
INTRAVENOUS | Status: AC
  Filled 2017-06-09: qty 100

## 2017-06-09 MED ORDER — PROPOFOL 10 MG/ML IV BOLUS
INTRAVENOUS | Status: DC | PRN
  Administered 2017-06-09: 40 mg via INTRAVENOUS
  Administered 2017-06-09: 100 mg via INTRAVENOUS
  Administered 2017-06-09 (×2): 30 mg via INTRAVENOUS

## 2017-06-09 MED ORDER — PROPOFOL 10 MG/ML IV BOLUS
INTRAVENOUS | Status: AC
  Filled 2017-06-09: qty 20

## 2017-06-09 MED ORDER — ESMOLOL HCL 100 MG/10ML IV SOLN
INTRAVENOUS | Status: AC
  Filled 2017-06-09: qty 10

## 2017-06-09 MED ORDER — 0.9 % SODIUM CHLORIDE (POUR BTL) OPTIME
TOPICAL | Status: DC | PRN
  Administered 2017-06-09: 1000 mL

## 2017-06-09 MED ORDER — FENTANYL CITRATE (PF) 100 MCG/2ML IJ SOLN
INTRAMUSCULAR | Status: AC
  Filled 2017-06-09: qty 2

## 2017-06-09 MED ORDER — ESMOLOL HCL 100 MG/10ML IV SOLN
INTRAVENOUS | Status: DC | PRN
  Administered 2017-06-09: 10 mg via INTRAVENOUS

## 2017-06-09 MED ORDER — ENOXAPARIN SODIUM 40 MG/0.4ML ~~LOC~~ SOLN
40.0000 mg | SUBCUTANEOUS | Status: DC
  Administered 2017-06-10 – 2017-06-12 (×3): 40 mg via SUBCUTANEOUS
  Filled 2017-06-09 (×3): qty 0.4

## 2017-06-09 MED ORDER — HYDROCODONE-ACETAMINOPHEN 5-325 MG PO TABS
1.0000 | ORAL_TABLET | ORAL | Status: DC | PRN
  Administered 2017-06-09 – 2017-06-12 (×11): 2 via ORAL
  Filled 2017-06-09 (×11): qty 2

## 2017-06-09 MED ORDER — SUGAMMADEX SODIUM 200 MG/2ML IV SOLN
INTRAVENOUS | Status: DC | PRN
  Administered 2017-06-09: 150 mg via INTRAVENOUS

## 2017-06-09 MED ORDER — OXYCODONE HCL 5 MG PO TABS: 5.0000 mg | ORAL_TABLET | Freq: Once | ORAL | Status: DC | PRN

## 2017-06-09 MED ORDER — ACETAMINOPHEN 650 MG RE SUPP: 650.0000 mg | RECTAL | Status: DC | PRN

## 2017-06-09 MED ORDER — ROCURONIUM BROMIDE 10 MG/ML (PF) SYRINGE
PREFILLED_SYRINGE | INTRAVENOUS | Status: DC | PRN
Start: 2017-06-09 — End: 2017-06-09
  Administered 2017-06-09: 40 mg via INTRAVENOUS

## 2017-06-09 MED ORDER — METOCLOPRAMIDE HCL 5 MG/ML IJ SOLN
5.0000 mg | Freq: Three times a day (TID) | INTRAMUSCULAR | Status: DC | PRN
  Administered 2017-06-09: 5 mg via INTRAVENOUS
  Filled 2017-06-09: qty 2

## 2017-06-09 MED ORDER — DOCUSATE SODIUM 100 MG PO CAPS
100.0000 mg | ORAL_CAPSULE | Freq: Two times a day (BID) | ORAL | Status: DC
Start: 2017-06-09 — End: 2017-06-13
  Administered 2017-06-09 – 2017-06-12 (×6): 100 mg via ORAL
  Filled 2017-06-09 (×6): qty 1

## 2017-06-09 MED ORDER — METOPROLOL TARTRATE 5 MG/5ML IV SOLN
INTRAVENOUS | Status: AC
  Filled 2017-06-09: qty 5

## 2017-06-09 MED ORDER — FENTANYL CITRATE (PF) 100 MCG/2ML IJ SOLN
25.0000 ug | INTRAMUSCULAR | Status: DC | PRN
  Administered 2017-06-09 (×2): 50 ug via INTRAVENOUS

## 2017-06-09 MED ORDER — FENTANYL CITRATE (PF) 100 MCG/2ML IJ SOLN
INTRAMUSCULAR | Status: DC | PRN
  Administered 2017-06-09 (×3): 50 ug via INTRAVENOUS
  Administered 2017-06-09: 100 ug via INTRAVENOUS
  Administered 2017-06-09 (×2): 50 ug via INTRAVENOUS

## 2017-06-09 MED ORDER — METOPROLOL TARTRATE 5 MG/5ML IV SOLN
INTRAVENOUS | Status: DC | PRN
  Administered 2017-06-09 (×2): 1 mg via INTRAVENOUS

## 2017-06-09 MED ORDER — PROPOFOL 10 MG/ML IV BOLUS
INTRAVENOUS | Status: AC
  Filled 2017-06-09: qty 60

## 2017-06-09 MED ORDER — LACTATED RINGERS IV SOLN
INTRAVENOUS | Status: DC | PRN
  Administered 2017-06-09 (×2): via INTRAVENOUS

## 2017-06-09 MED ORDER — METHOCARBAMOL 1000 MG/10ML IJ SOLN
500.0000 mg | Freq: Four times a day (QID) | INTRAVENOUS | Status: DC | PRN
  Administered 2017-06-09: 500 mg via INTRAVENOUS
  Filled 2017-06-09: qty 5
  Filled 2017-06-09: qty 550

## 2017-06-09 MED ORDER — HYDROMORPHONE HCL 1 MG/ML IJ SOLN
0.5000 mg | INTRAMUSCULAR | Status: DC | PRN
  Administered 2017-06-09 (×2): 1 mg via INTRAVENOUS
  Filled 2017-06-09 (×2): qty 1

## 2017-06-09 MED ORDER — SODIUM CHLORIDE 0.9 % IV SOLN
INTRAVENOUS | Status: DC
  Administered 2017-06-09: 14:00:00 via INTRAVENOUS

## 2017-06-09 MED ORDER — METHOCARBAMOL 500 MG PO TABS
500.0000 mg | ORAL_TABLET | Freq: Four times a day (QID) | ORAL | Status: DC | PRN
  Administered 2017-06-10: 500 mg via ORAL
  Filled 2017-06-09: qty 1

## 2017-06-09 MED ORDER — CEFAZOLIN SODIUM-DEXTROSE 1-4 GM/50ML-% IV SOLN
1.0000 g | Freq: Three times a day (TID) | INTRAVENOUS | Status: AC
  Administered 2017-06-09 – 2017-06-10 (×3): 1 g via INTRAVENOUS
  Filled 2017-06-09 (×3): qty 50

## 2017-06-09 MED ORDER — CEFAZOLIN SODIUM-DEXTROSE 2-3 GM-%(50ML) IV SOLR
INTRAVENOUS | Status: DC | PRN
  Administered 2017-06-09: 2 g via INTRAVENOUS

## 2017-06-09 MED ORDER — POLYETHYLENE GLYCOL 3350 17 G PO PACK
17.0000 g | PACK | Freq: Every day | ORAL | Status: DC
  Administered 2017-06-11 – 2017-06-12 (×2): 17 g via ORAL
  Filled 2017-06-09 (×2): qty 1

## 2017-06-09 MED ORDER — METOCLOPRAMIDE HCL 5 MG PO TABS: 5.0000 mg | ORAL_TABLET | Freq: Three times a day (TID) | ORAL | Status: DC | PRN

## 2017-06-09 MED ORDER — ONDANSETRON HCL 4 MG/2ML IJ SOLN
INTRAMUSCULAR | Status: DC | PRN
  Administered 2017-06-09: 4 mg via INTRAVENOUS

## 2017-06-09 MED ORDER — SUCCINYLCHOLINE CHLORIDE 20 MG/ML IJ SOLN
INTRAMUSCULAR | Status: DC | PRN
  Administered 2017-06-09: 100 mg via INTRAVENOUS

## 2017-06-09 MED ORDER — CEFAZOLIN SODIUM-DEXTROSE 2-4 GM/100ML-% IV SOLN
INTRAVENOUS | Status: AC
  Filled 2017-06-09: qty 100

## 2017-06-09 MED ORDER — PHENYLEPHRINE HCL 10 MG/ML IJ SOLN
INTRAMUSCULAR | Status: DC | PRN
Start: 2017-06-09 — End: 2017-06-09
  Administered 2017-06-09: 100 ug via INTRAVENOUS
  Administered 2017-06-09: 120 ug via INTRAVENOUS

## 2017-06-09 MED ORDER — ONDANSETRON HCL 4 MG PO TABS: 4.0000 mg | ORAL_TABLET | Freq: Four times a day (QID) | ORAL | Status: DC | PRN

## 2017-06-09 MED ORDER — ACETAMINOPHEN 325 MG PO TABS: 650.0000 mg | ORAL_TABLET | ORAL | Status: DC | PRN

## 2017-06-09 SURGICAL SUPPLY — 55 items
BAG ZIPLOCK 12X15 (MISCELLANEOUS) ×2 IMPLANT
BANDAGE ACE 6X5 VEL STRL LF (GAUZE/BANDAGES/DRESSINGS) ×2 IMPLANT
BIT DRILL CALIBRATED 4.3MMX365 (DRILL) ×1 IMPLANT
BIT DRILL CROWE PNT TWST 4.5MM (DRILL) ×1 IMPLANT
BLADE SAW SGTL 11.0X1.19X90.0M (BLADE) IMPLANT
BNDG COHESIVE 4X5 TAN STRL (GAUZE/BANDAGES/DRESSINGS) ×2 IMPLANT
BNDG GAUZE ELAST 4 BULKY (GAUZE/BANDAGES/DRESSINGS) ×2 IMPLANT
CABLE CERLAGE W/CRIMP 1.8 (Cable) ×2 IMPLANT
CABLE GTR COCR 1.8X635 (Orthopedic Implant) IMPLANT
COVER SURGICAL LIGHT HANDLE (MISCELLANEOUS) ×2 IMPLANT
DRAPE C-ARM 42X120 X-RAY (DRAPES) ×2 IMPLANT
DRAPE C-ARMOR (DRAPES) ×2 IMPLANT
DRAPE EXTREMITY T 121X128X90 (DRAPE) ×2 IMPLANT
DRAPE ORTHO SPLIT 77X108 STRL (DRAPES) ×1
DRAPE SURG ORHT 6 SPLT 77X108 (DRAPES) ×1 IMPLANT
DRILL CALIBRATED 4.3MMX365 (DRILL) ×2
DRILL CROWE POINT TWIST 4.5MM (DRILL) ×2
DRSG EMULSION OIL 3X16 NADH (GAUZE/BANDAGES/DRESSINGS) ×2 IMPLANT
DRSG MEPILEX BORDER 4X12 (GAUZE/BANDAGES/DRESSINGS) ×2 IMPLANT
DRSG MEPILEX BORDER 4X4 (GAUZE/BANDAGES/DRESSINGS) ×2 IMPLANT
DURAPREP 26ML APPLICATOR (WOUND CARE) ×2 IMPLANT
ELECT REM PT RETURN 15FT ADLT (MISCELLANEOUS) ×2 IMPLANT
GAUZE SPONGE 4X4 12PLY STRL (GAUZE/BANDAGES/DRESSINGS) ×2 IMPLANT
GLOVE BIOGEL M 7.0 STRL (GLOVE) IMPLANT
GLOVE BIOGEL PI IND STRL 7.5 (GLOVE) ×1 IMPLANT
GLOVE BIOGEL PI IND STRL 8.5 (GLOVE) ×1 IMPLANT
GLOVE BIOGEL PI INDICATOR 7.5 (GLOVE) ×1
GLOVE BIOGEL PI INDICATOR 8.5 (GLOVE) ×1
GLOVE ECLIPSE 8.0 STRL XLNG CF (GLOVE) IMPLANT
GLOVE ORTHO TXT STRL SZ7.5 (GLOVE) ×4 IMPLANT
GLOVE SURG ORTHO 8.0 STRL STRW (GLOVE) ×2 IMPLANT
GOWN STRL REUS W/TWL LRG LVL3 (GOWN DISPOSABLE) ×2 IMPLANT
GOWN STRL REUS W/TWL XL LVL3 (GOWN DISPOSABLE) ×4 IMPLANT
GUIDEPIN 3.2X17.5 THRD DISP (PIN) ×2 IMPLANT
GUIDEWIRE BEAD TIP (WIRE) ×2 IMPLANT
KIT BASIN OR (CUSTOM PROCEDURE TRAY) ×2 IMPLANT
MANIFOLD NEPTUNE II (INSTRUMENTS) ×2 IMPLANT
NAIL FEM RETRO 12X340 (Nail) ×2 IMPLANT
NS IRRIG 1000ML POUR BTL (IV SOLUTION) ×2 IMPLANT
PACK TOTAL JOINT (CUSTOM PROCEDURE TRAY) ×2 IMPLANT
PAD ABD 8X10 STRL (GAUZE/BANDAGES/DRESSINGS) ×4 IMPLANT
POSITIONER SURGICAL ARM (MISCELLANEOUS) ×2 IMPLANT
SCREW CORT TI DBL LEAD 5X30 (Screw) ×2 IMPLANT
SCREW CORT TI DBL LEAD 5X65 (Screw) ×2 IMPLANT
SCREW CORT TI DBL LEAD 5X70 (Screw) ×4 IMPLANT
SCREW CORT TI DBL LEAD 5X75 (Screw) ×2 IMPLANT
SCREW CORT TI DBL LEAD 5X80 (Screw) ×2 IMPLANT
STAPLER VISISTAT 35W (STAPLE) ×4 IMPLANT
STRIP CLOSURE SKIN 1/2X4 (GAUZE/BANDAGES/DRESSINGS) ×2 IMPLANT
SUT MNCRL AB 4-0 PS2 18 (SUTURE) ×2 IMPLANT
SUT VIC AB 1 CT1 36 (SUTURE) ×6 IMPLANT
SUT VIC AB 2-0 CT1 27 (SUTURE) ×2
SUT VIC AB 2-0 CT1 TAPERPNT 27 (SUTURE) ×2 IMPLANT
TOWEL OR 17X26 10 PK STRL BLUE (TOWEL DISPOSABLE) ×4 IMPLANT
WATER STERILE IRR 1000ML POUR (IV SOLUTION) ×2 IMPLANT

## 2017-06-09 NOTE — Progress Notes (Signed)
Spoke with patients daughter and made her aware that patient was out of surgery and back in room.

## 2017-06-09 NOTE — Anesthesia Postprocedure Evaluation (Signed)
Anesthesia Post Note  Patient: Nancy MinorRoxana R Suarez  Procedure(s) Performed: OPEN REDUCTION INTERNAL FIXATION (ORIF) DISTAL FEMUR FRACTURE (Left Knee)     Patient location during evaluation: PACU Anesthesia Type: General Level of consciousness: awake and alert Pain management: pain level controlled Vital Signs Assessment: post-procedure vital signs reviewed and stable Respiratory status: spontaneous breathing, nonlabored ventilation, respiratory function stable and patient connected to nasal cannula oxygen Cardiovascular status: blood pressure returned to baseline and stable Postop Assessment: no apparent nausea or vomiting Anesthetic complications: no    Last Vitals:  Vitals:   06/09/17 1245 06/09/17 1246  BP:  130/76  Pulse: 88 88  Resp: 15 17  Temp:  36.6 C  SpO2: 100% 99%    Last Pain:  Vitals:   06/09/17 1246  TempSrc:   PainSc: 0-No pain                 Oktober Glazer S

## 2017-06-09 NOTE — Progress Notes (Signed)
Patient ID: Nancy Suarez, female   DOB: 01/28/1932, 83 y.o.   MRN: [id number]  Ready for OR today Hgb 10.6 after transfusions  Consent ordered and signed Plan for ORIF left distal femur fracture

## 2017-06-09 NOTE — Brief Op Note (Signed)
06/05/2017 - 06/09/2017  9:18 AM  PATIENT:  Nancy Suarez  83 y.o. female  PRE-OPERATIVE DIAGNOSIS:  left distal periprosthetic femur fracture  POST-OPERATIVE DIAGNOSIS:  left distal periprosthetic femur fracture  PROCEDURE:  Procedure(s): OPEN REDUCTION INTERNAL FIXATION (ORIF) DISTAL FEMUR FRACTURE (Left)  SURGEON:  Surgeon(s) and Role:    Durene Romans* Jett Fukuda, MD - Primary  PHYSICIAN ASSISTANT: None  ASSISTANTS:Surgical team  ANESTHESIA:   general  EBL:  500 cc  BLOOD ADMINISTERED:none  DRAINS: none   LOCAL MEDICATIONS USED:  NONE  SPECIMEN:  No Specimen  DISPOSITION OF SPECIMEN:  N/A  COUNTS:  YES  TOURNIQUET:  * No tourniquets in log *  DICTATION: .Other Dictation: Dictation Number [id number]  PLAN OF CARE: Admit to inpatient   PATIENT DISPOSITION:  PACU - hemodynamically stable.   Delay start of Pharmacological VTE agent (>24hrs) due to surgical blood loss or risk of bleeding: no

## 2017-06-09 NOTE — Op Note (Signed)
NAMRise Mu:  Nancy Suarez, Nancy Suarez                ACCOUNT NO.:  [id number]  MEDICAL RECORD NO.:  [id number]  LOCATION:  [id number]                         [facility]  PHYSICIAN:  Nancy Suarez, M.D.  DATE OF BIRTH:  1932-02-12  DATE OF PROCEDURE:  06/09/2017 DATE OF DISCHARGE:                              OPERATIVE REPORT   PREOPERATIVE DIAGNOSES:  Comminuted distal periprosthetic femur fracture above total knee replacement.  POSTOPERATIVE DIAGNOSIS:  Comminuted distal periprosthetic femur fracture above total knee replacement, this is left.  PROCEDURE:  Open reduction and internal fixation of left distal femur fracture utilizing a [employer] [place] nail 12 x 34 cm with 4 distal interlocking and one proximal interlock as well as 1 cable from [employer].  SURGEON:  Nancy Suarez, M.D.  ASSISTANT:  Surgical Team.  ANESTHESIA:  Attempted spinal and subsequent general anesthetic.  SPECIMENS:  None.  COMPLICATIONS:  None.  BLOOD LOSS:  Around 500 mL.  INDICATIONS FOR PROCEDURE:  Ms. Nancy Suarez is an 83 year old patient of mine with previous left total knee replacement that she has been doing well with over the past 4 years.  She unfortunately had a ground level fall. She was admitted to the hospital earlier in the week.  She was admitted to the Medical Service.  She was found to have acute blood loss anemia associated with the fracture most likely as well as anticoagulation. Her hemoglobin dropped down to 6.  We gave her series of 4 units of blood transfusion over a period of 2 days to get ready for surgery. Risks, benefits, and necessity of the procedure were discussed and reviewed.  Consent was obtained for fracture fixation and functional capabilities.  Risks of nonunion, malunion, infection, DVT, and need for future surgeries were reviewed.  Consent was obtained.  PROCEDURE IN DETAIL:  The patient was brought to the operative theater. Once adequate anesthesia, preoperative antibiotics,  Ancef administered, she was positioned supine on the table to allow for fluoroscopic imaging of her femur.  The perineum was pre draped out.  The left lower extremity was then prepped and draped in sterile fashion. Time-out was performed identifying the patient, planned procedure, and extremity.  Based on my preoperative planning, I did use her old knee incision, extended it proximally to allow for exposure of the distal segments for possible cabling as well as reduction as well as freeing up soft tissues including muscles and soft tissue at the fracture site to prevent union.  This incision was made.  Soft tissue dissection was carried out and hemostasis carried out, no tourniquet utilized.  An arthrotomy was then made evacuating a very large hematoma and hemarthrosis from the joint.  At this point, using the smaller triangle and under fluoroscopic imaging, we identified reduction mainly identifying anatomic landmarks to confirm maintenance of her leg length as best as possible due to the comminution.  Once I adequately had this set up, I opened up the distal portion of the femoral component, removed the cement plug from this area.  Then, under fluoroscopic imaging, I confirmed the passage of a ball-tipped guidewire to the hip.  I then measured and selected a 34 cm nail.  I then  opened up the proximal femur with the starting drill.  I then reamed with a 10 to a 12, then a 12.5 mm reamer.  We selected a 12 mm diameter x 34 cm nail.  It was then passed on the insertion jig.  Once I had it again passed in both AP and lateral planes fluoroscopically, making certain that the femoral component was neither extended or flexed, I began placing screws.  I placed 4 distal screws through the jig, mainly through the lateral side, lateral distal, lateral proximal, lateral oblique, and then a medial oblique.  At this point, further attempts at maintaining the reduction were carried out.  I ended  up selecting to use a cable at the fracture site to further stabilize the segment to the rod.  Once this was placed under fluoroscopic images, crimped and tightened.  At this point, I placed a proximal interlock in the static position under fluoroscopic perfect circle technique.  Final radiographs were obtained in AP and lateral planes.  I then irrigated the knee wound out with 1 L normal saline solution with pulse lavage.  The proximal incision site was closed with staples.  The distal knee incision was closed in layers with #1 Vicryl and #1 Stratafix suture used on the arthrotomy.  I then used 2-0 Vicryl and staples on the skin.  Her leg was cleaned, dried, and dressed sterilely using Mepilex dressing and a bulky dressing across the knee.  Her knee was placed back in a knee immobilizer.  She was then extubated and woken from anesthesia, brought to the recovery room in stable condition. Findings were reviewed with her daughter.  At this point, I will have her be nonweightbearing for probably 6 weeks, probably keep her in knee immobilizer for at least first 2-4 weeks before we start bending through the knee to prevent any concerns for fracture healing.     Nancy Suarez, M.D.     MDO/MEDQ  D:  06/09/2017  T:  06/09/2017  Job:  [id number]

## 2017-06-09 NOTE — Anesthesia Procedure Notes (Signed)
Procedure Name: Intubation Performed by: Gean Maidens, CRNA Pre-anesthesia Checklist: Patient identified, Emergency Drugs available, Patient being monitored, Timeout performed and Suction available Patient Re-evaluated:Patient Re-evaluated prior to induction Oxygen Delivery Method: Circle system utilized Preoxygenation: Pre-oxygenation with 100% oxygen Induction Type: IV induction Ventilation: Mask ventilation without difficulty Laryngoscope Size: Mac and 3 Grade View: Grade I Tube type: Oral Tube size: 7.0 mm Number of attempts: 1 Airway Equipment and Method: Stylet Placement Confirmation: ETT inserted through vocal cords under direct vision,  positive ETCO2,  CO2 detector and breath sounds checked- equal and bilateral Secured at: 21 cm Tube secured with: Tape Dental Injury: Teeth and Oropharynx as per pre-operative assessment

## 2017-06-09 NOTE — Anesthesia Preprocedure Evaluation (Signed)
Anesthesia Evaluation  Patient identified by MRN, date of birth, ID band Patient awake    Reviewed: Allergy & Precautions, H&P , NPO status , Patient's Chart, lab work & pertinent test results  Airway Mallampati: II   Neck ROM: full    Dental   Pulmonary former smoker,    breath sounds clear to auscultation       Cardiovascular hypertension,  Rhythm:regular Rate:Normal     Neuro/Psych PSYCHIATRIC DISORDERS Depression    GI/Hepatic GERD  ,  Endo/Other    Renal/GU      Musculoskeletal  (+) Arthritis ,   Abdominal   Peds  Hematology  (+) Blood dyscrasia, anemia ,   Anesthesia Other Findings   Reproductive/Obstetrics                             Anesthesia Physical Anesthesia Plan  ASA: III  Anesthesia Plan: Spinal   Post-op Pain Management:    Induction: Intravenous  PONV Risk Score and Plan: 2 and Treatment may vary due to age or medical condition, Ondansetron and Propofol infusion  Airway Management Planned: Simple Face Mask  Additional Equipment:   Intra-op Plan:   Post-operative Plan:   Informed Consent: I have reviewed the patients History and Physical, chart, labs and discussed the procedure including the risks, benefits and alternatives for the proposed anesthesia with the patient or authorized representative who has indicated his/her understanding and acceptance.     Plan Discussed with: CRNA, Anesthesiologist and Surgeon  Anesthesia Plan Comments:         Anesthesia Quick Evaluation

## 2017-06-09 NOTE — Progress Notes (Signed)
PT Note  Patient Details Name: Berta MinorRoxana R Stiehl MRN: [id number] DOB: 09/30/1931      Noted pt with surgery today, will await and see patient tomorrow.    Marella BileBRITT, Kiante Petrovich 06/09/2017, 1:53 PM  Marella BileSharron Alvira Hecht, PT Pager: [phone] 06/09/2017

## 2017-06-09 NOTE — Transfer of Care (Signed)
Immediate Anesthesia Transfer of Care Note  Patient: Nancy Suarez  Procedure(s) Performed: OPEN REDUCTION INTERNAL FIXATION (ORIF) DISTAL FEMUR FRACTURE (Left Knee)  Patient Location: PACU  Anesthesia Type:General  Level of Consciousness: awake, alert  and oriented  Airway & Oxygen Therapy: Patient Spontanous Breathing and Patient connected to face mask oxygen  Post-op Assessment: Report given to RN and Post -op Vital signs reviewed and stable  Post vital signs: Reviewed and stable  Last Vitals:  Vitals:   06/08/17 2225 06/09/17 0635  BP: (!) 132/53 (!) 139/51  Pulse: 85 85  Resp: 15 19  Temp: 37.6 C 37.5 C  SpO2: 98% 97%    Last Pain:  Vitals:   06/09/17 0635  TempSrc: Oral  PainSc:       Patients Stated Pain Goal: 3 (06/09/17 0616)  Complications: No apparent anesthesia complications

## 2017-06-09 NOTE — Progress Notes (Signed)
PHARMACY NOTE:  ANTIMICROBIAL RENAL DOSAGE ADJUSTMENT  Current antimicrobial regimen includes a mismatch between antimicrobial dosage and estimated renal function.  As per policy approved by the Pharmacy & Therapeutics and Medical Executive Committees, the antimicrobial dosage will be adjusted accordingly.  Current antimicrobial dosage:  Cefazolin 1g IV q6h x 3 doses post-operatively  Indication: surgical prophylaxis  Renal Function:  Estimated Creatinine Clearance: 37.5 mL/min (A) (by C-G formula based on SCr of 1.15 mg/dL (H)). []      On intermittent HD, scheduled: []      On CRRT    Antimicrobial dosage has been changed to: Cefazolin 1g IV q8h x 3 doses  Thank you for allowing pharmacy to be a part of this patient's care.  Jamse MeadGadhia, Keldan Eplin M, [facility] 06/09/2017 2:10 PM

## 2017-06-09 NOTE — Progress Notes (Addendum)
Patient ID: Nancy Suarez, female   DOB: 06/11/1932, 83 y.o.   MRN: [id number]  PROGRESS NOTE    LANNIE HEAPS  WUJ:[id number] DOB: 1932-04-23 DOA: 06/05/2017  PCP: System, Pcp Not In   Brief Narrative:  83 y.o.femalewith medical history significant ofhypertension, hyperlipidemia, GERD, pancreatitis, iron deficiency anemia, CKD-3, who presents after a mechanical fall and left hip pain.  Patient states thatshe tripped over her husband's walker at home and falling injuring her left legthe night of 06/05/17. She isS/Pofleft total knee replacement 2-3 years ago by Dr Durene Romans.  Orthopedic surgery consulted and plans for left distal femur surgical repair on 06/08/17. She is agreeable to SNF for rehab after her surgery.  Patient refused blood transfusion 06/06/17. 06/07/17 morning Hg 6.3 and surgery cancelled. Accepted blood transfusion 06/07/17. Hg dropped again 06/08/17 am from 8 to 7. 2 U PRBCs ordered to be transfused for possible surgery 06/09/17. FOBT ordered. Left lower extremity edema.  Assessment & Plan:   Closed fracture of left distal femur 2/2. To mechanical fall: -Orthopedic surgeon Dr.Norris possible surgical intervention tomorrow 06/08/17 -robaxin for muscle spasms -blood transfusion 2 U PRBCs 06/07/17 . On 06/08/17 hg 8 from 6 then dropped to 7. -additional 2U PRBCs ordered 06/08/17 - Plan for surgery this am - Appreciate ortho following   Symptomatic acute on chronic Iron deficiency anemia: -possible acute blood loss from fracture -hg 6.3 from 7.4 from 9.3 -baseline hg 9 -transfused total 4U PRBCs -patient refused blood transfusion 06/06/17 - Continue iron supplementation - Follow up CBC In am  AKI on CKD 3, resolved  - Acute renal failure due to dehydration - Cr stable at 1.15  Leukocytosis, resolved - Likely reactive - Follow up CBC In am  Essential hypertension  - Controlled - Continue current meds   HLD - Continue Lipitor   Depression -  Stable - Continue mirtazapine   GERD - Stable  - Continue Protonix     DVT prophylaxis: SCD's Code Status: full code  Family Communication: no family at the bedside  Disposition Plan: surgery today    Consultants:   Orthopedic surgery   Procedures:   Surgical repair of left distal femur 06/09/2017  Antimicrobials:   None    Subjective: Reports abdominal pain this am.  Objective: Vitals:   06/08/17 1727 06/08/17 1936 06/08/17 2225 06/09/17 0635  BP: (!) 95/47 (!) 136/55 (!) 132/53 (!) 139/51  Pulse: 87 96 85 85  Resp: 16  15 19   Temp: 99 F (37.2 C) 99.9 F (37.7 C) 99.6 F (37.6 C) 99.5 F (37.5 C)  TempSrc: Oral  Oral Oral  SpO2: 98% 98% 98% 97%  Weight:      Height:        Intake/Output Summary (Last 24 hours) at 06/09/2017 1042 Last data filed at 06/09/2017 1021 Gross per 24 hour  Intake 2839 ml  Output 2250 ml  Net 589 ml   Filed Weights   06/05/17 1726  Weight: 74.8 kg (165 lb)    Examination:  General exam: Appears calm and comfortable  Respiratory system: Clear to auscultation. Respiratory effort normal. Cardiovascular system: S1 & S2 heard, Rate controlled  Gastrointestinal system: Abdomen is nondistended, soft. Tender in mid abdomen to palpation without guarding  Central nervous system: Alert and oriented. No focal neurological deficits. Extremities: S+1-2 LE edema appreciated  Skin: No rashes, lesions or ulcers Psychiatry: Judgement and insight appear normal. Mood & affect appropriate.   Data Reviewed: I have personally reviewed following labs  and imaging studies  CBC: Recent Labs  Lab 06/05/17 1817 06/06/17 0600 06/07/17 0535 06/07/17 1445 06/08/17 0515 06/08/17 2146 06/09/17 0523  WBC 11.1* 7.6 8.4  --  9.4  --  11.3*  NEUTROABS 9.5*  --   --   --   --   --   --   HGB 9.3* 7.4* 6.3* 8.6* 7.9* 10.3* 10.6*  HCT 28.9* 23.2* 19.2* 25.7* 23.7* 30.4* 31.6*  MCV 90.3 90.3 90.1  --  88.1  --  87.5  PLT 245 180 130*  --  129*  --   148*   Basic Metabolic Panel: Recent Labs  Lab 06/05/17 1817 06/06/17 0600 06/07/17 0535 06/08/17 0515 06/09/17 0523  NA 142 140 138 138 138  K 4.0 4.2 4.0 4.1 4.2  CL 111 110 110 109 105  CO2 26 26 24 24 25   GLUCOSE 114* 141* 126* 113* 131*  BUN 30* 25* 16 15 17   CREATININE 1.82* 1.43* 1.21* 1.14* 1.15*  CALCIUM 9.4 9.1 8.5* 8.8* 8.7*   GFR: Estimated Creatinine Clearance: 37.5 mL/min (A) (by C-G formula based on SCr of 1.15 mg/dL (H)). Liver Function Tests: Recent Labs  Lab 06/05/17 1817  AST 24  ALT 19  ALKPHOS 70  BILITOT 0.6  PROT 6.7  ALBUMIN 3.9   No results for input(s): LIPASE, AMYLASE in the last 168 hours. No results for input(s): AMMONIA in the last 168 hours. Coagulation Profile: Recent Labs  Lab 06/05/17 2215  INR 1.08   Cardiac Enzymes: No results for input(s): CKTOTAL, CKMB, CKMBINDEX, TROPONINI in the last 168 hours. BNP (last 3 results) No results for input(s): PROBNP in the last 8760 hours. HbA1C: No results for input(s): HGBA1C in the last 72 hours. CBG: Recent Labs  Lab 06/06/17 0953 06/07/17 0712 06/08/17 0738 06/09/17 0734  GLUCAP 136* 114* 100* 124*   Lipid Profile: No results for input(s): CHOL, HDL, LDLCALC, TRIG, CHOLHDL, LDLDIRECT in the last 72 hours. Thyroid Function Tests: No results for input(s): TSH, T4TOTAL, FREET4, T3FREE, THYROIDAB in the last 72 hours. Anemia Panel: No results for input(s): VITAMINB12, FOLATE, FERRITIN, TIBC, IRON, RETICCTPCT in the last 72 hours. Urine analysis:    Component Value Date/Time   COLORURINE YELLOW 01/12/2014 1537   APPEARANCEUR CLEAR 01/12/2014 1537   LABSPEC 1.024 01/12/2014 1537   PHURINE 5.0 01/12/2014 1537   GLUCOSEU NEGATIVE 01/12/2014 1537   HGBUR NEGATIVE 01/12/2014 1537   BILIRUBINUR NEGATIVE 01/12/2014 1537   KETONESUR NEGATIVE 01/12/2014 1537   PROTEINUR NEGATIVE 01/12/2014 1537   UROBILINOGEN 0.2 01/12/2014 1537   NITRITE NEGATIVE 01/12/2014 1537   LEUKOCYTESUR  SMALL (A) 01/12/2014 1537   Sepsis Labs: @LABRCNTIP (procalcitonin:4,lacticidven:4)   ) Recent Results (from the past 240 hour(s))  MRSA PCR Screening     Status: None   Collection Time: 06/09/17  7:25 AM  Result Value Ref Range Status   MRSA by PCR NEGATIVE NEGATIVE Final    Comment:        The GeneXpert MRSA Assay (FDA approved for NASAL specimens only), is one component of a comprehensive MRSA colonization surveillance program. It is not intended to diagnose MRSA infection nor to guide or monitor treatment for MRSA infections.       Radiology Studies: Dg Knee 1-2 Views Left  Result Date: 06/05/2017 CLINICAL DATA:  Recent fall with distal thigh pain, initial encounter EXAM: LEFT KNEE - 1-2 VIEW COMPARISON:  None. FINDINGS: There is a comminuted fracture of the distal left femur involving the junction of  the diaphysis and metaphysis. The fracture lines appear to extend to the level of the femoral prosthesis. Some mild posterior displacement of the distal fracture fragments is noted. IMPRESSION: Comminuted distal femoral fracture which appears to extend towards the femoral portion of the knee prosthesis. Electronically Signed   By: Alcide CleverMark  Lukens M.D.   On: 06/05/2017 18:19   Ct Head Wo Contrast  Result Date: 06/06/2017 CLINICAL DATA:  Larey SeatFell yesterday with trauma to the right side of the head and neck. EXAM: CT HEAD WITHOUT CONTRAST CT CERVICAL SPINE WITHOUT CONTRAST TECHNIQUE: Multidetector CT imaging of the head and cervical spine was performed following the standard protocol without intravenous contrast. Multiplanar CT image reconstructions of the cervical spine were also generated. COMPARISON:  None. FINDINGS: CT HEAD FINDINGS Brain: Mild age related atrophy. Mild chronic appearing small vessel change of the hemispheric white matter. No sign of acute infarction, mass lesion, hemorrhage, hydrocephalus or extra-axial collection. Vascular: There is atherosclerotic calcification of the  major vessels at the base of the brain. Skull: Negative Sinuses/Orbits: Clear/normal Other: None CT CERVICAL SPINE FINDINGS Alignment: No traumatic malalignment. Mild curvature convex to the right. 3 mm anterolisthesis C1-2. One or 2 mm degenerative anterolisthesis C3-4 and C6-7 because of facet arthropathy. Skull base and vertebrae: No fracture. See below for degenerative findings. Soft tissues and spinal canal: Negative Disc levels: Foramen magnum and C1-2: Degenerative C1-2 arthropathy more pronounced on the left than the right. 3 mm of anterolisthesis of C1 relative to C2 without significant canal compromise. Findings could certainly relate to craniocervical pain. C2-3:  Mild disc bulge.  No stenosis. C3-4: Facet arthropathy on the right with 2 mm anterolisthesis. No canal stenosis. Foraminal narrowing on the right. C4-5: Degenerative spondylosis. Foraminal narrowing left more than right. C5-6: Degenerative spondylosis. Left-sided facet degeneration. Foraminal narrowing left more than right. C6-7: Facet degeneration with 1 or 2 mm of anterolisthesis. No compressive stenosis. C7-T1: Facet degeneration without slippage. No significant stenosis. T1 and T2:  Negative for traumatic finding.  Facet degeneration. Upper chest: Enlarged thyroid with upper mediastinal extension, not primarily evaluated. Presumed benign goiter. Other: None IMPRESSION: Head CT: No acute or traumatic finding. Mild age related atrophy and small-vessel ischemic change. Cervical spine CT: No acute or traumatic finding. Extensive degenerative changes throughout the region as outlined above. Electronically Signed   By: Paulina FusiMark  Shogry M.D.   On: 06/06/2017 12:16   Ct Cervical Spine Wo Contrast  Result Date: 06/06/2017 CLINICAL DATA:  Larey SeatFell yesterday with trauma to the right side of the head and neck. EXAM: CT HEAD WITHOUT CONTRAST CT CERVICAL SPINE WITHOUT CONTRAST TECHNIQUE: Multidetector CT imaging of the head and cervical spine was performed  following the standard protocol without intravenous contrast. Multiplanar CT image reconstructions of the cervical spine were also generated. COMPARISON:  None. FINDINGS: CT HEAD FINDINGS Brain: Mild age related atrophy. Mild chronic appearing small vessel change of the hemispheric white matter. No sign of acute infarction, mass lesion, hemorrhage, hydrocephalus or extra-axial collection. Vascular: There is atherosclerotic calcification of the major vessels at the base of the brain. Skull: Negative Sinuses/Orbits: Clear/normal Other: None CT CERVICAL SPINE FINDINGS Alignment: No traumatic malalignment. Mild curvature convex to the right. 3 mm anterolisthesis C1-2. One or 2 mm degenerative anterolisthesis C3-4 and C6-7 because of facet arthropathy. Skull base and vertebrae: No fracture. See below for degenerative findings. Soft tissues and spinal canal: Negative Disc levels: Foramen magnum and C1-2: Degenerative C1-2 arthropathy more pronounced on the left than the right. 3 mm  of anterolisthesis of C1 relative to C2 without significant canal compromise. Findings could certainly relate to craniocervical pain. C2-3:  Mild disc bulge.  No stenosis. C3-4: Facet arthropathy on the right with 2 mm anterolisthesis. No canal stenosis. Foraminal narrowing on the right. C4-5: Degenerative spondylosis. Foraminal narrowing left more than right. C5-6: Degenerative spondylosis. Left-sided facet degeneration. Foraminal narrowing left more than right. C6-7: Facet degeneration with 1 or 2 mm of anterolisthesis. No compressive stenosis. C7-T1: Facet degeneration without slippage. No significant stenosis. T1 and T2:  Negative for traumatic finding.  Facet degeneration. Upper chest: Enlarged thyroid with upper mediastinal extension, not primarily evaluated. Presumed benign goiter. Other: None IMPRESSION: Head CT: No acute or traumatic finding. Mild age related atrophy and small-vessel ischemic change. Cervical spine CT: No acute or  traumatic finding. Extensive degenerative changes throughout the region as outlined above. Electronically Signed   By: Paulina FusiMark  Shogry M.D.   On: 06/06/2017 12:16   Dg Hip Unilat W Or Wo Pelvis 2-3 Views Left  Result Date: 06/05/2017 CLINICAL DATA:  Recent fall with left leg pain, initial encounter EXAM: DG HIP (WITH OR WITHOUT PELVIS) 3V LEFT COMPARISON:  None. FINDINGS: Degenerative changes of lumbar spine are noted. The pelvic ring is intact. Mild degenerative changes of the hip joints are noted bilaterally. No acute fracture is noted. No soft tissue changes are seen. IMPRESSION: Degenerative change without acute abnormality. Electronically Signed   By: Alcide CleverMark  Lukens M.D.   On: 06/05/2017 18:20   Dg Femur Min 2 Views Left  Result Date: 06/05/2017 CLINICAL DATA:  Recent fall with left leg pain, initial encounter EXAM: LEFT FEMUR 2 VIEWS COMPARISON:  None. FINDINGS: Proximal femur is within normal limits. Comminuted distal femoral fracture is noted at the junction of the diaphysis and metaphysis with extension towards the femoral portion of the knee prosthesis. Some posterior displacement of the distal fracture fragments is noted. No other focal abnormality is seen. IMPRESSION: Comminuted distal left femoral fracture as described. Electronically Signed   By: Alcide CleverMark  Lukens M.D.   On: 06/05/2017 18:19      Scheduled Meds: . [MAR Hold] amLODipine  5 mg Oral Daily  . [MAR Hold] atorvastatin  20 mg Oral QPM  . [MAR Hold] calcium-vitamin D  1 tablet Oral BID WC  . chlorhexidine  60 mL Topical Once  . [MAR Hold] colesevelam  625 mg Oral Q M,W,F  . [MAR Hold] docusate sodium  100 mg Oral BID  . [MAR Hold] ferrous sulfate  325 mg Oral TID PC  . [MAR Hold] loratadine  10 mg Oral Daily  . [MAR Hold] mirtazapine  15 mg Oral QHS  . [MAR Hold] multivitamin with minerals  1 tablet Oral Daily  . [MAR Hold] pantoprazole  40 mg Oral Daily  . [MAR Hold] polyethylene glycol  17 g Oral Daily  . povidone-iodine  2  application Topical Once  . [MAR Hold] zolpidem  5 mg Oral QHS   Continuous Infusions: . sodium chloride Stopped (06/09/17 0958)  . [MAR Hold] sodium chloride    . [MAR Hold] sodium chloride    . sodium chloride    . ceFAZolin    .  ceFAZolin (ANCEF) IV    . methocarbamol (ROBAXIN)  IV       LOS: 4 days    Time spent: 25 minutes  Greater than 50% of the time spent on counseling and coordinating the care.   Manson PasseyAlma Ramiah Helfrich, MD [facility] Pager [phone]  If 7PM-7AM, please  contact night-coverage [email or web address] Password [facility] 06/09/2017, 10:42 AM

## 2017-06-10 ENCOUNTER — Encounter (HOSPITAL_COMMUNITY): Payer: Self-pay | Admitting: Orthopedic Surgery

## 2017-06-10 LAB — BASIC METABOLIC PANEL
Anion gap: 7 (ref 5–15)
BUN: 19 mg/dL (ref 6–20)
CALCIUM: 8.2 mg/dL — AB (ref 8.9–10.3)
CO2: 25 mmol/L (ref 22–32)
Chloride: 104 mmol/L (ref 101–111)
Creatinine, Ser: 1.34 mg/dL — ABNORMAL HIGH (ref 0.44–1.00)
GFR calc Af Amer: 41 mL/min — ABNORMAL LOW (ref >60)
GFR, EST NON AFRICAN AMERICAN: 36 mL/min — AB (ref >60)
Glucose, Bld: 138 mg/dL — ABNORMAL HIGH (ref 65–99)
POTASSIUM: 4.4 mmol/L (ref 3.5–5.1)
SODIUM: 136 mmol/L (ref 135–145)

## 2017-06-10 LAB — CBC
HEMATOCRIT: 26.4 % — AB (ref 36.0–46.0)
HEMOGLOBIN: 8.8 g/dL — AB (ref 12.0–15.0)
MCH: 29.8 pg (ref 26.0–34.0)
MCHC: 33.3 g/dL (ref 30.0–36.0)
MCV: 89.5 fL (ref 78.0–100.0)
Platelets: 171 10*3/uL (ref 150–400)
RBC: 2.95 MIL/uL — ABNORMAL LOW (ref 3.87–5.11)
RDW: 14.4 % (ref 11.5–15.5)
WBC: 13.5 10*3/uL — AB (ref 4.0–10.5)

## 2017-06-10 LAB — GLUCOSE, CAPILLARY: Glucose-Capillary: 134 mg/dL — ABNORMAL HIGH (ref 65–99)

## 2017-06-10 MED ORDER — POLYSACCHARIDE IRON COMPLEX 150 MG PO CAPS: 150.0000 mg | ORAL_CAPSULE | Freq: Every day | ORAL | Status: DC

## 2017-06-10 NOTE — Evaluation (Signed)
Physical Therapy Evaluation Patient Details Name: Nancy MinorRoxana R Suarez MRN: [id number] DOB: 10/07/1931 Today's Date: 06/10/2017   History of Present Illness  83 y.o. female with medical history significant of L TKA, hypertension, hyperlipidemia, GERD, pancreatitis, iron deficiency anemia, CKD-3, who presents with fall and the left hip pain. Dx L distal periprosthetic femur fracture above TKA, s/p ORIF 06/09/17.   Clinical Impression  Pt admitted with above diagnosis. Pt currently with functional limitations due to the deficits listed below (see PT Problem List). Mod assist of 2 for sit to stand. Pt fatigued from transfer so didn't attempt ambulation. Will likely need ST-SNF. Pt will benefit from skilled PT to increase their independence and safety with mobility to allow discharge to the venue listed below.       Follow Up Recommendations SNF;Supervision/Assistance - 24 hour    Equipment Recommendations  Wheelchair cushion (measurements PT);Wheelchair (measurements PT)    Recommendations for Other Services       Precautions / Restrictions Precautions Precautions: Fall Precaution Comments: pt reports 2 falls just prior to admission, plus 3 other falls this past 1 year Required Braces or Orthoses: Knee Immobilizer - Left Knee Immobilizer - Left: On at all times Restrictions Weight Bearing Restrictions: Yes LLE Weight Bearing: Non weight bearing Other Position/Activity Restrictions: no flexion L knee      Mobility  Bed Mobility               General bed mobility comments: up on BSC  Transfers Overall transfer level: Needs assistance Equipment used: Rolling walker (2 wheeled) Transfers: Sit to/from Stand Sit to Stand: Mod assist;+2 physical assistance         General transfer comment: VCs hand placement, assist to rise/steady ([facility] then recliner brought up behind her)  Ambulation/Gait                Stairs            Wheelchair Mobility     Modified Rankin (Stroke Patients Only)       Balance Overall balance assessment: Needs assistance;History of Falls Sitting-balance support: Feet supported;No upper extremity supported Sitting balance-Leahy Scale: Fair     Standing balance support: Bilateral upper extremity supported Standing balance-Leahy Scale: Poor Standing balance comment: requires BUE support and mod A, NWB LLE                             Pertinent Vitals/Pain Pain Assessment: 0-10 Pain Score: 7  Pain Location: LLE Pain Descriptors / Indicators: Sore Pain Intervention(s): Limited activity within patient's tolerance;Monitored during session;Premedicated before session;Ice applied    Home Living Family/patient expects to be discharged to:: Skilled nursing facility Living Arrangements: Spouse/significant other               Additional Comments: pt is primary CG for spouse    Prior Function Level of Independence: Independent with assistive device(s)         Comments: used rollator, independent ADLs     Hand Dominance   Dominant Hand: Right    Extremity/Trunk Assessment   Upper Extremity Assessment Upper Extremity Assessment: Overall WFL for tasks assessed    Lower Extremity Assessment Lower Extremity Assessment: LLE deficits/detail LLE Deficits / Details: able to PF/DF ankle actively LLE: Unable to fully assess due to immobilization;Unable to fully assess due to pain    Cervical / Trunk Assessment Cervical / Trunk Assessment: Other exceptions Cervical / Trunk Exceptions: scoliosis  Communication   Communication: No difficulties  Cognition Arousal/Alertness: Awake/alert Behavior During Therapy: WFL for tasks assessed/performed Overall Cognitive Status: Within Functional Limits for tasks assessed                                        General Comments      Exercises Total Joint Exercises Ankle Circles/Pumps: AROM;Both;10 reps;Supine    Assessment/Plan    PT Assessment Patient needs continued PT services  PT Problem List Decreased balance;Decreased activity tolerance;Decreased mobility;Decreased knowledge of use of DME;Decreased knowledge of precautions       PT Treatment Interventions Gait training;DME instruction;Therapeutic activities;Functional mobility training;Therapeutic exercise    PT Goals (Current goals can be found in the Care Plan section)  Acute Rehab PT Goals Patient Stated Goal: return to walking with RW PT Goal Formulation: With patient Time For Goal Achievement: 06/24/17 Potential to Achieve Goals: Good    Frequency Min 3X/week   Barriers to discharge        Co-evaluation               AM-PAC PT "6 Clicks" Daily Activity  Outcome Measure Difficulty turning over in bed (including adjusting bedclothes, sheets and blankets)?: Unable Difficulty moving from lying on back to sitting on the side of the bed? : Unable Difficulty sitting down on and standing up from a chair with arms (e.g., wheelchair, bedside commode, etc,.)?: Unable Help needed moving to and from a bed to chair (including a wheelchair)?: A Lot Help needed walking in hospital room?: Total Help needed climbing 3-5 steps with a railing? : Total 6 Click Score: 7    End of Session Equipment Utilized During Treatment: Gait belt;Left knee immobilizer Activity Tolerance: Patient limited by pain;Patient tolerated treatment well Patient left: in chair;with call bell/phone within reach Nurse Communication: Mobility status PT Visit Diagnosis: Unsteadiness on feet (R26.81);History of falling (Z91.81);Repeated falls (R29.6);Difficulty in walking, not elsewhere classified (R26.2)    Time: [id number] PT Time Calculation (min) (ACUTE ONLY): 14 min   Charges:   PT Evaluation $PT Eval Moderate Complexity: 1 Mod     PT G Codes:       Tamala SerUhlenberg, Kennard Fildes Kistler 06/10/2017, 9:08 AM [phone]

## 2017-06-10 NOTE — Evaluation (Signed)
Occupational Therapy Evaluation Patient Details Name: Nancy MinorRoxana R Suarez MRN: [id number] DOB: 07/09/1931 Today's Date: 06/10/2017    History of Present Illness 83 y.o. female with medical history significant of L TKA, hypertension, hyperlipidemia, GERD, pancreatitis, iron deficiency anemia, CKD-3, who presents with fall and the left hip pain. Dx L distal periprosthetic femur fracture above TKA, s/p ORIF 06/09/17.    Clinical Impression   Pt admitted with above.  She demonstrates the below listed deficits.  She currently requires min A for UB ADLs and max - total A for LB.   Mod A +2 for functional transfers.  She lives with spouse and is primary caregiver for him.  Recommend SNF level rehab.     Follow Up Recommendations  SNF;Supervision/Assistance - 24 hour    Equipment Recommendations  None recommended by OT    Recommendations for Other Services       Precautions / Restrictions Precautions Precautions: Fall Precaution Comments: pt reports 2 falls just prior to admission, plus 3 other falls this past 1 year Required Braces or Orthoses: Knee Immobilizer - Left Knee Immobilizer - Left: On at all times Restrictions Weight Bearing Restrictions: Yes LLE Weight Bearing: Non weight bearing Other Position/Activity Restrictions: no flexion L knee      Mobility Bed Mobility               General bed mobility comments: up in recliner   Transfers Overall transfer level: Needs assistance Equipment used: Rolling walker (2 wheeled) Transfers: Sit to/from Stand Sit to Stand: Mod assist;+2 physical assistance         General transfer comment: did not attempt as pt fatigued     Balance Overall balance assessment: Needs assistance;History of Falls Sitting-balance support: Feet supported;No upper extremity supported Sitting balance-Leahy Scale: Fair     Standing balance support: Bilateral upper extremity supported Standing balance-Leahy Scale: Poor Standing balance comment:  requires BUE support and mod A, NWB LLE                           ADL either performed or assessed with clinical judgement   ADL Overall ADL's : Needs assistance/impaired Eating/Feeding: Independent   Grooming: Wash/dry hands;Wash/dry face;Oral care;Brushing hair;Set up;Sitting   Upper Body Bathing: Minimal assistance;Sitting   Lower Body Bathing: Maximal assistance;Sit to/from stand;Sitting/lateral leans   Upper Body Dressing : Minimal assistance;Sitting   Lower Body Dressing: Total assistance;Sit to/from stand   Toilet Transfer: Moderate assistance;+2 for physical assistance;Stand-pivot;BSC;RW   Toileting- Clothing Manipulation and Hygiene: Total assistance;Sit to/from stand       Functional mobility during ADLs: Moderate assistance;+2 for physical assistance;Rolling walker       Vision         Perception     Praxis      Pertinent Vitals/Pain Pain Assessment: Faces Pain Score: 7  Faces Pain Scale: Hurts little more Pain Location: LLE Pain Descriptors / Indicators: Sore Pain Intervention(s): Monitored during session;Ice applied     Hand Dominance Right   Extremity/Trunk Assessment Upper Extremity Assessment Upper Extremity Assessment: Generalized weakness   Lower Extremity Assessment Lower Extremity Assessment: Defer to PT evaluation LLE Deficits / Details: able to PF/DF ankle actively LLE: Unable to fully assess due to immobilization;Unable to fully assess due to pain   Cervical / Trunk Assessment Cervical / Trunk Assessment: Other exceptions Cervical / Trunk Exceptions: scoliosis   Communication Communication Communication: No difficulties   Cognition Arousal/Alertness: Awake/alert Behavior During Therapy: WFL for tasks  assessed/performed Overall Cognitive Status: No family/caregiver present to determine baseline cognitive functioning                                 General Comments: Pt requires cues for problem solving     General Comments       Exercises Exercises: General Upper Extremity Total Joint Exercises Ankle Circles/Pumps: AROM;Both;10 reps;Supine General Exercises - Upper Extremity Chair Push Up: Strengthening;Right;Left;10 reps;Seated   Shoulder Instructions      Home Living Family/patient expects to be discharged to:: Skilled nursing facility Living Arrangements: Spouse/significant other                               Additional Comments: pt is primary CG for spouse      Prior Functioning/Environment Level of Independence: Independent with assistive device(s)        Comments: used rollator, independent ADLs        OT Problem List: Decreased strength;Decreased activity tolerance;Impaired balance (sitting and/or standing);Decreased safety awareness;Decreased knowledge of use of DME or AE;Decreased knowledge of precautions;Pain      OT Treatment/Interventions:      OT Goals(Current goals can be found in the care plan section) Acute Rehab OT Goals Patient Stated Goal: to regain independence  OT Goal Formulation: All assessment and education complete, DC therapy  OT Frequency:     Barriers to D/C: Decreased caregiver support          Co-evaluation              AM-PAC PT "6 Clicks" Daily Activity     Outcome Measure Help from another person eating meals?: None Help from another person taking care of personal grooming?: A Little Help from another person toileting, which includes using toliet, bedpan, or urinal?: A Lot Help from another person bathing (including washing, rinsing, drying)?: A Lot Help from another person to put on and taking off regular upper body clothing?: A Little Help from another person to put on and taking off regular lower body clothing?: Total 6 Click Score: 15   End of Session Equipment Utilized During Treatment: Left knee immobilizer Nurse Communication: Mobility status  Activity Tolerance: Patient limited by fatigue Patient  left: in bed;with call bell/phone within reach  OT Visit Diagnosis: Unsteadiness on feet (R26.81);Pain Pain - Right/Left: Left Pain - part of body: Knee;Leg                Time: [id number] OT Time Calculation (min): 14 min Charges:  OT General Charges $OT Visit: 1 Visit OT Evaluation $OT Eval Moderate Complexity: 1 Mod G-Codes:     Reynolds AmericanWendi Julian Suarez, OTR/L [phone]   Nancy Suarez, Nancy Suarez 06/10/2017, 9:46 AM

## 2017-06-10 NOTE — Progress Notes (Signed)
Patient ID: Nancy MinorRoxana R Lecount, female   DOB: 02/14/1932, 83 y.o.   MRN: [id number]  PROGRESS NOTE    Nancy MinorRoxana R Amano  WNU:[id number] DOB: 12/28/1931 DOA: 06/05/2017  PCP: System, Pcp Not In   Brief Narrative:  83 y.o.femalewith medical history significant ofhypertension, hyperlipidemia, GERD, pancreatitis, iron deficiency anemia, CKD-3, who presents after a mechanical fall and left hip pain.  Patient states thatshe tripped over her husband's walker at home and falling injuring her left legthe night of 06/05/17. She isS/Pofleft total knee replacement 2-3 years ago by Dr Durene RomansMatthew Olin.  Patient refused blood transfusion 06/06/2017. Her hemoglobin was 6.3 on 06/07/2017 surgery was canceled because of anemia. Patient then accepted blood transfusion. Patient underwent open reduction internal fixation of the left distal femur fracture on 06/09/2017.  Assessment & Plan:   Left distal femoral fracture secondary to mechanical fall - Status post open reduction internal fixation of the left distal femur fracture on 06/09/2017 - Continue pain management efforts and physical therapy evaluation - Appreciate surgery following  Iron deficiency anemia / anemia of chronic disease - So far during this hospital stay patient has received total of 5 units of PRBC transfusion - Monitor CBC daily - Continue ferrous sulfate supplementation  AKI on CKD 3, resolved - Based and creatinine is 1.37 in July 2015 - Creatinine within baseline range  Leukocytosis, resolved - Likely reactive - Follow-up CBC in the morning  Essential hypertension  - Continue Norvasc 5 mg daily  Dyslipidemia - Continue Lipitor 20 mg at bedtime  Depression - Stable - Continue mirtazapine   GERD - Continue Protonix     DVT prophylaxis: SCD's Code Status: full code  Family Communication: no family at the bedside Disposition Plan: PT eval pending, D/C once cleared by ortho    Consultants:   Ortho  Procedures:     Surgical repair of left distal femur 06/09/2017  Antimicrobials:   None    Subjective: No overnight events.  Objective: Vitals:   06/09/17 1850 06/09/17 2247 06/10/17 0111 06/10/17 0621  BP: (!) 138/55 (!) 153/68 (!) 138/49 (!) 131/52  Pulse: (!) 102 (!) 110 (!) 102 (!) 106  Resp: 16 17 16 19   Temp: 99.5 F (37.5 C) 99.1 F (37.3 C) (!) 101.7 F (38.7 C) 98.8 F (37.1 C)  TempSrc: Oral Oral Oral Oral  SpO2: 96% 97% 100% 99%  Weight:      Height:        Intake/Output Summary (Last 24 hours) at 06/10/2017 0907 Last data filed at 06/10/2017 0900 Gross per 24 hour  Intake 5047.5 ml  Output 1450 ml  Net 3597.5 ml   Filed Weights   06/05/17 1726  Weight: 74.8 kg (165 lb)   Physical Exam  Constitutional: Appears well-developed and well-nourished. No distress.  CVS: RRR, S1/S2 + Pulmonary: Effort and breath sounds normal, no stridor, rhonchi, wheezes, rales.  Abdominal: Soft. BS +,  no distension, tenderness, rebound or guarding.  Musculoskeletal: No edema and no tenderness.  Lymphadenopathy: No lymphadenopathy noted, cervical, inguinal. Neuro: Alert. Normal reflexes, muscle tone coordination. No cranial nerve deficit. Skin: Skin is warm and dry.  Psychiatric: Normal mood and affect. Behavior, judgment, thought content normal.     Data Reviewed: I have personally reviewed following labs and imaging studies  CBC: Recent Labs  Lab 06/05/17 1817 06/06/17 0600 06/07/17 0535 06/07/17 1445 06/08/17 0515 06/08/17 2146 06/09/17 0523 06/10/17 0506  WBC 11.1* 7.6 8.4  --  9.4  --  11.3* 13.5*  NEUTROABS  9.5*  --   --   --   --   --   --   --   HGB 9.3* 7.4* 6.3* 8.6* 7.9* 10.3* 10.6* 8.8*  HCT 28.9* 23.2* 19.2* 25.7* 23.7* 30.4* 31.6* 26.4*  MCV 90.3 90.3 90.1  --  88.1  --  87.5 89.5  PLT 245 180 130*  --  129*  --  148* 171   Basic Metabolic Panel: Recent Labs  Lab 06/06/17 0600 06/07/17 0535 06/08/17 0515 06/09/17 0523 06/10/17 0506  NA 140 138 138 138  136  K 4.2 4.0 4.1 4.2 4.4  CL 110 110 109 105 104  CO2 26 24 24 25 25   GLUCOSE 141* 126* 113* 131* 138*  BUN 25* 16 15 17 19   CREATININE 1.43* 1.21* 1.14* 1.15* 1.34*  CALCIUM 9.1 8.5* 8.8* 8.7* 8.2*   GFR: Estimated Creatinine Clearance: 32.2 mL/min (A) (by C-G formula based on SCr of 1.34 mg/dL (H)). Liver Function Tests: Recent Labs  Lab 06/05/17 1817  AST 24  ALT 19  ALKPHOS 70  BILITOT 0.6  PROT 6.7  ALBUMIN 3.9   No results for input(s): LIPASE, AMYLASE in the last 168 hours. No results for input(s): AMMONIA in the last 168 hours. Coagulation Profile: Recent Labs  Lab 06/05/17 2215  INR 1.08   Cardiac Enzymes: No results for input(s): CKTOTAL, CKMB, CKMBINDEX, TROPONINI in the last 168 hours. BNP (last 3 results) No results for input(s): PROBNP in the last 8760 hours. HbA1C: No results for input(s): HGBA1C in the last 72 hours. CBG: Recent Labs  Lab 06/06/17 0953 06/07/17 0712 06/08/17 0738 06/09/17 0734 06/10/17 0723  GLUCAP 136* 114* 100* 124* 134*   Lipid Profile: No results for input(s): CHOL, HDL, LDLCALC, TRIG, CHOLHDL, LDLDIRECT in the last 72 hours. Thyroid Function Tests: No results for input(s): TSH, T4TOTAL, FREET4, T3FREE, THYROIDAB in the last 72 hours. Anemia Panel: No results for input(s): VITAMINB12, FOLATE, FERRITIN, TIBC, IRON, RETICCTPCT in the last 72 hours. Urine analysis:    Component Value Date/Time   COLORURINE YELLOW 01/12/2014 1537   APPEARANCEUR CLEAR 01/12/2014 1537   LABSPEC 1.024 01/12/2014 1537   PHURINE 5.0 01/12/2014 1537   GLUCOSEU NEGATIVE 01/12/2014 1537   HGBUR NEGATIVE 01/12/2014 1537   BILIRUBINUR NEGATIVE 01/12/2014 1537   KETONESUR NEGATIVE 01/12/2014 1537   PROTEINUR NEGATIVE 01/12/2014 1537   UROBILINOGEN 0.2 01/12/2014 1537   NITRITE NEGATIVE 01/12/2014 1537   LEUKOCYTESUR SMALL (A) 01/12/2014 1537   Sepsis Labs: @LABRCNTIP (procalcitonin:4,lacticidven:4)   ) Recent Results (from the past 240  hour(s))  MRSA PCR Screening     Status: None   Collection Time: 06/09/17  7:25 AM  Result Value Ref Range Status   MRSA by PCR NEGATIVE NEGATIVE Final    Comment:        The GeneXpert MRSA Assay (FDA approved for NASAL specimens only), is one component of a comprehensive MRSA colonization surveillance program. It is not intended to diagnose MRSA infection nor to guide or monitor treatment for MRSA infections.       Radiology Studies: Dg Knee 1-2 Views Left  Result Date: 06/09/2017 CLINICAL DATA:  Right femur ORIF. EXAM: DG C-ARM 61-120 MIN-NO REPORT; LEFT KNEE - 1-2 VIEW COMPARISON:  Two-view right femur 06/05/2017 FLUOROSCOPY TIME:  Fluoroscopy Time:  1 minutes 35 seconds Radiation Exposure Index (if provided by the fluoroscopic device): 7.92 mGy Number of Acquired Spot Images: 0 FINDINGS: The distal aspect of a retrograde IM rod is demonstrated on 3  intraoperative fluoro spot images. There is reduction of the comminuted distal femur fracture. AP reduction is near anatomic. Slight angulation remains. A cerclage wire is evident at the level of the fracture. Four distal screws are present. IMPRESSION: 1. Interval placement of intramedullary rod and cerclage wire with significant reduction of the comminuted distal femur fracture. 2. No radiographic evidence for complication. Electronically Signed   By: Marin Roberts M.D.   On: 06/09/2017 13:10   Ct Head Wo Contrast  Result Date: 06/06/2017 CLINICAL DATA:  Larey Seat yesterday with trauma to the right side of the head and neck. EXAM: CT HEAD WITHOUT CONTRAST CT CERVICAL SPINE WITHOUT CONTRAST TECHNIQUE: Multidetector CT imaging of the head and cervical spine was performed following the standard protocol without intravenous contrast. Multiplanar CT image reconstructions of the cervical spine were also generated. COMPARISON:  None. FINDINGS: CT HEAD FINDINGS Brain: Mild age related atrophy. Mild chronic appearing small vessel change of the  hemispheric white matter. No sign of acute infarction, mass lesion, hemorrhage, hydrocephalus or extra-axial collection. Vascular: There is atherosclerotic calcification of the major vessels at the base of the brain. Skull: Negative Sinuses/Orbits: Clear/normal Other: None CT CERVICAL SPINE FINDINGS Alignment: No traumatic malalignment. Mild curvature convex to the right. 3 mm anterolisthesis C1-2. One or 2 mm degenerative anterolisthesis C3-4 and C6-7 because of facet arthropathy. Skull base and vertebrae: No fracture. See below for degenerative findings. Soft tissues and spinal canal: Negative Disc levels: Foramen magnum and C1-2: Degenerative C1-2 arthropathy more pronounced on the left than the right. 3 mm of anterolisthesis of C1 relative to C2 without significant canal compromise. Findings could certainly relate to craniocervical pain. C2-3:  Mild disc bulge.  No stenosis. C3-4: Facet arthropathy on the right with 2 mm anterolisthesis. No canal stenosis. Foraminal narrowing on the right. C4-5: Degenerative spondylosis. Foraminal narrowing left more than right. C5-6: Degenerative spondylosis. Left-sided facet degeneration. Foraminal narrowing left more than right. C6-7: Facet degeneration with 1 or 2 mm of anterolisthesis. No compressive stenosis. C7-T1: Facet degeneration without slippage. No significant stenosis. T1 and T2:  Negative for traumatic finding.  Facet degeneration. Upper chest: Enlarged thyroid with upper mediastinal extension, not primarily evaluated. Presumed benign goiter. Other: None IMPRESSION: Head CT: No acute or traumatic finding. Mild age related atrophy and small-vessel ischemic change. Cervical spine CT: No acute or traumatic finding. Extensive degenerative changes throughout the region as outlined above. Electronically Signed   By: Paulina Fusi M.D.   On: 06/06/2017 12:16   Ct Cervical Spine Wo Contrast  Result Date: 06/06/2017 CLINICAL DATA:  Larey Seat yesterday with trauma to the  right side of the head and neck. EXAM: CT HEAD WITHOUT CONTRAST CT CERVICAL SPINE WITHOUT CONTRAST TECHNIQUE: Multidetector CT imaging of the head and cervical spine was performed following the standard protocol without intravenous contrast. Multiplanar CT image reconstructions of the cervical spine were also generated. COMPARISON:  None. FINDINGS: CT HEAD FINDINGS Brain: Mild age related atrophy. Mild chronic appearing small vessel change of the hemispheric white matter. No sign of acute infarction, mass lesion, hemorrhage, hydrocephalus or extra-axial collection. Vascular: There is atherosclerotic calcification of the major vessels at the base of the brain. Skull: Negative Sinuses/Orbits: Clear/normal Other: None CT CERVICAL SPINE FINDINGS Alignment: No traumatic malalignment. Mild curvature convex to the right. 3 mm anterolisthesis C1-2. One or 2 mm degenerative anterolisthesis C3-4 and C6-7 because of facet arthropathy. Skull base and vertebrae: No fracture. See below for degenerative findings. Soft tissues and spinal canal: Negative Disc levels:  Foramen magnum and C1-2: Degenerative C1-2 arthropathy more pronounced on the left than the right. 3 mm of anterolisthesis of C1 relative to C2 without significant canal compromise. Findings could certainly relate to craniocervical pain. C2-3:  Mild disc bulge.  No stenosis. C3-4: Facet arthropathy on the right with 2 mm anterolisthesis. No canal stenosis. Foraminal narrowing on the right. C4-5: Degenerative spondylosis. Foraminal narrowing left more than right. C5-6: Degenerative spondylosis. Left-sided facet degeneration. Foraminal narrowing left more than right. C6-7: Facet degeneration with 1 or 2 mm of anterolisthesis. No compressive stenosis. C7-T1: Facet degeneration without slippage. No significant stenosis. T1 and T2:  Negative for traumatic finding.  Facet degeneration. Upper chest: Enlarged thyroid with upper mediastinal extension, not primarily evaluated.  Presumed benign goiter. Other: None IMPRESSION: Head CT: No acute or traumatic finding. Mild age related atrophy and small-vessel ischemic change. Cervical spine CT: No acute or traumatic finding. Extensive degenerative changes throughout the region as outlined above. Electronically Signed   By: Paulina Fusi M.D.   On: 06/06/2017 12:16   Dg C-arm 61-120 Min-no Report  Result Date: 06/09/2017 Fluoroscopy was utilized by the requesting physician.  No radiographic interpretation.      Scheduled Meds: . amLODipine  5 mg Oral Daily  . atorvastatin  20 mg Oral QPM  . calcium-vitamin D  1 tablet Oral BID WC  . colesevelam  625 mg Oral Q M,W,F  . docusate sodium  100 mg Oral BID  . enoxaparin (LOVENOX) injection  40 mg Subcutaneous Q24H  . ferrous sulfate  325 mg Oral TID PC  . loratadine  10 mg Oral Daily  . mirtazapine  15 mg Oral QHS  . multivitamin with minerals  1 tablet Oral Daily  . pantoprazole  40 mg Oral Daily  . polyethylene glycol  17 g Oral Daily  . zolpidem  5 mg Oral QHS   Continuous Infusions: . sodium chloride    . sodium chloride    . sodium chloride 75 mL/hr at 06/09/17 1414  .  ceFAZolin (ANCEF) IV Stopped (06/10/17 0138)  . methocarbamol (ROBAXIN)  IV Stopped (06/09/17 1230)     LOS: 5 days    Time spent: 25 minutes  Greater than 50% of the time spent on counseling and coordinating the care.   Manson Passey, MD [facility] Pager [phone]  If 7PM-7AM, please contact night-coverage [email or web address] Password [email or web address] 06/10/2017, 9:07 AM

## 2017-06-10 NOTE — Clinical Social Work Note (Signed)
Clinical Social Work Assessment  Patient Details  Name: Nancy MinorRoxana R Archambeau MRN: [id number] Date of Birth: 03/29/1932  Date of referral:  06/10/17               Reason for consult:  Facility Placement                Permission sought to share information with:  Family Supports Permission granted to share information::  Yes, Verbal Permission Granted  Name::     daughter Joanne CharsMaria  Agency::     Relationship::     Contact Information:     Housing/Transportation Living arrangements for the past 2 months:  Single Family Home Source of Information:  Adult Children Patient Interpreter Needed:  None Criminal Activity/Legal Involvement Pertinent to Current Situation/Hospitalization:  No - Comment as needed Significant Relationships:  Spouse Lives with:  Spouse Do you feel safe going back to the place where you live?  Yes Need for family participation in patient care:  Yes (Comment)(daughter decision maker)  Care giving concerns:  Pt from home where she resides with her husband. Both she and husband in hospital currently following falls at home. Pt had surgery yesterday and SNF rehab recommended at DC.    Social Worker assessment / plan:  CSW consulted to assist with SNF placement. Pt fell at home and had surgery following fracture. Understands SNF is recommended for therapy following DC. Pt reports she was in rehab at [facility] about 3 years ago and had good experience, would like to return. Of note, pt's husband is in hospital also and DC plan for him is SNF as well. Daughter would like both parents to be able to go to same SNF if at all possible. CSW explained efforts will be made but unable to guarantee both parents could go to same facility as care needs and bed availability may differ. Daughter understanding.  FL2, referrals made. Will follow up with bed offers (family wanting [facility], [facility] 2nd choice) Will need BCBS Medicare pre-authorization for SNF. Initiated auth but will need bed offer  and choice to complete request.   Employment status:  Retired Database administratornsurance information:  Managed Medicare PT Recommendations:  Skilled Nursing Facility Information / Referral to community resources:  Skilled Nursing Facility  Patient/Family's Response to care:  appreciative  Patient/Family's Understanding of and Emotional Response to Diagnosis, Current Treatment, and Prognosis:  Demonstrates good understanding of treatment and plan. Emotionally seems somewhat stressed due to her and husband both being in hospital and needing rehab but are positive re: plan.   Emotional Assessment Appearance:  Appears stated age Attitude/Demeanor/Rapport:  (pleasant) Affect (typically observed):  Accepting, Calm Orientation:  Oriented to Self, Oriented to Place, Oriented to Situation Alcohol / Substance use:  Not Applicable Psych involvement (Current and /or in the community):  No (Comment)  Discharge Needs  Concerns to be addressed:  Discharge Planning Concerns, Care Coordination Readmission within the last 30 days:  No Current discharge risk:  Dependent with Mobility Barriers to Discharge:  Continued Medical Work up   Terex CorporationMeghan R Oyuki Hogan, LCSW 06/10/2017, 12:57 PM  [phone]

## 2017-06-10 NOTE — Progress Notes (Signed)
   Subjective: 1 Day Post-Op Procedure(s) (LRB): OPEN REDUCTION INTERNAL FIXATION (ORIF) DISTAL FEMUR FRACTURE (Left) Patient reports pain as mild.   Patient seen in rounds for Dr. Charlann Boxerlin Patient is well, but has had some minor complaints of pain in the leg and knee, requiring pain medications We will start therapy today. She will be NWB to the left leg for about 6 weeks. Knee Immobilizer at all times unless bathing for the next 2-4 weeks.  Objective: Vital signs in last 24 hours: Temp:  [97.6 F (36.4 C)-101.7 F (38.7 C)] 98.8 F (37.1 C) (12/09 0621) Pulse Rate:  [80-110] 106 (12/09 0621) Resp:  [11-19] 19 (12/09 0621) BP: (129-153)/(49-76) 131/52 (12/09 0621) SpO2:  [96 %-100 %] 99 % (12/09 0621)  Intake/Output from previous day: 12/08 0701 - 12/09 0700 In: 4807.5 [P.O.:1020; I.V.:3532.5; IV Piggyback:255] Out: 1550 [Urine:1050; Blood:500] Intake/Output this shift: No intake/output data recorded.  Recent Labs    06/07/17 1445 06/08/17 0515 06/08/17 2146 06/09/17 0523 06/10/17 0506  HGB 8.6* 7.9* 10.3* 10.6* 8.8*   Recent Labs    06/09/17 0523 06/10/17 0506  WBC 11.3* 13.5*  RBC 3.61* 2.95*  HCT 31.6* 26.4*  PLT 148* 171   Recent Labs    06/09/17 0523 06/10/17 0506  NA 138 136  K 4.2 4.4  CL 105 104  CO2 25 25  BUN 17 19  CREATININE 1.15* 1.34*  GLUCOSE 131* 138*  CALCIUM 8.7* 8.2*   No results for input(s): LABPT, INR in the last 72 hours.  EXAM General - Patient is Alert and Appropriate Extremity - Neurovascular intact Sensation intact distally Intact pulses distally Dorsiflexion/Plantar flexion intact Dressing - dressing C/D/I Motor Function - intact, moving foot and toes well on exam.   Past Medical History:  Diagnosis Date  . ASCUS (atypical squamous cells of undetermined significance) on Pap smear   . Atrophic vaginitis   . Chronic insomnia   . Depression   . GERD (gastroesophageal reflux disease)   . Goiter   . History of kidney  stones   . Hypercholesteremia   . Hypertension   . LGSIL (low grade squamous intraepithelial dysplasia)   . Osteopenia   . Ovarian cyst    "shrank it"  . Pancreatitis   . Scoliosis   . Spinal arthritis     Assessment/Plan: 1 Day Post-Op Procedure(s) (LRB): OPEN REDUCTION INTERNAL FIXATION (ORIF) DISTAL FEMUR FRACTURE (Left) Principal Problem:   Closed fracture of left distal femur (HCC) Active Problems:   GERD (gastroesophageal reflux disease)   Overweight (BMI 25.0-29.9)   Essential hypertension   HLD (hyperlipidemia)   Depression   Iron deficiency anemia   Acute renal failure superimposed on stage 3 chronic kidney disease (HCC)   Fall  Estimated body mass index is 27.46 kg/m as calculated from the following:   Height as of this encounter: 5\' 5"  (1.651 m).   Weight as of this encounter: 74.8 kg (165 lb). Advance diet Up with therapy NWB left leg DVT Prophylaxis - Lovenox D/C O2 and Pulse OX and try on Room Air HGB 8.8 - monitor - Iron  Avel Peacerew Perkins, PA-C Orthopaedic Surgery 06/10/2017, 7:15 AM

## 2017-06-10 NOTE — NC FL2 (Signed)
[place] MEDICAID FL2 LEVEL OF CARE SCREENING TOOL     IDENTIFICATION  Patient Name: Nancy Suarez Birthdate: 08-01-1931 Sex: female Admission Date (Current Location): 06/05/2017  [facility] and [place] Number:      Facility and Address:  [facility], [address] [place], [place] [address]      Provider Number: [id number]  Attending Physician Name and Address:  Alison Murray, MD  Relative Name and Phone Number:       Current Level of Care: Hospital Recommended Level of Care: Skilled Nursing Facility Prior Approval Number:    Date Approved/Denied:   PASRR Number: [id number]  Discharge Plan: SNF    Current Diagnoses: Patient Active Problem List   Diagnosis Date Noted  . Essential hypertension 06/05/2017  . HLD (hyperlipidemia) 06/05/2017  . Depression 06/05/2017  . Iron deficiency anemia 06/05/2017  . Acute renal failure superimposed on stage 3 chronic kidney disease (HCC) 06/05/2017  . Closed fracture of left distal femur (HCC) 06/05/2017  . Fall   . Overweight (BMI 25.0-29.9) 01/21/2014  . Postoperative anemia due to acute blood loss 01/21/2014  . S/P left TKA 01/19/2014  . Scoliosis   . Osteopenia   . Chronic insomnia   . Atrophic vaginitis   . Spinal arthritis   . GERD (gastroesophageal reflux disease)   . ASCUS (atypical squamous cells of undetermined significance) on Pap smear   . Ovarian cyst   . LGSIL (low grade squamous intraepithelial dysplasia)     Orientation RESPIRATION BLADDER Height & Weight     Self, Time, Situation, Place  O2(see DC summary) Continent Weight: 165 lb (74.8 kg) Height:  5\' 5"  (165.1 cm)  BEHAVIORAL SYMPTOMS/MOOD NEUROLOGICAL BOWEL NUTRITION STATUS      Continent    AMBULATORY STATUS COMMUNICATION OF NEEDS Skin   Extensive Assist Verbally Surgical wounds(closed left leg incision, compression wrap dressing)                       Personal Care Assistance Level of Assistance  Bathing,  Feeding, Dressing Bathing Assistance: Limited assistance Feeding assistance: Independent Dressing Assistance: Limited assistance     Functional Limitations Info  Sight, Hearing, Speech Sight Info: Impaired Hearing Info: Adequate Speech Info: Adequate    SPECIAL CARE FACTORS FREQUENCY  PT (By licensed PT), OT (By licensed OT)     PT Frequency: 5x/wk OT Frequency: 5x/wk            Contractures Contractures Info: Not present    Additional Factors Info  Allergies, Code Status, Psychotropic Code Status Info: Full Allergies Info: Aspirin, Codeine, Sulfa Antibiotics Psychotropic Info: Remeron 15mg  daily at bedtime         Current Medications (06/10/2017):  This is the current hospital active medication list Current Facility-Administered Medications  Medication Dose Route Frequency Provider Last Rate Last Dose  . 0.9 %  sodium chloride infusion   Intravenous Once Kirby-Graham, Beather Arbour, NP      . 0.9 %  sodium chloride infusion   Intravenous Once Hall, Carole N, DO      . 0.9 %  sodium chloride infusion   Intravenous Continuous Durene Romans, MD 75 mL/hr at 06/09/17 1414    . acetaminophen (TYLENOL) tablet 650 mg  650 mg Oral Q4H PRN Durene Romans, MD       Or  . acetaminophen (TYLENOL) suppository 650 mg  650 mg Rectal Q4H PRN Durene Romans, MD      . amLODipine (  NORVASC) tablet 5 mg  5 mg Oral Daily Lorretta HarpNiu, Xilin, MD   5 mg at 06/08/17 [id number]  . atorvastatin (LIPITOR) tablet 20 mg  20 mg Oral QPM Lorretta HarpNiu, Xilin, MD   20 mg at 06/08/17 1839  . calcium-vitamin D (OSCAL WITH D) 500-200 MG-UNIT per tablet 1 tablet  1 tablet Oral BID WC Lorretta HarpNiu, Xilin, MD   1 tablet at 06/08/17 1839  . ceFAZolin (ANCEF) IVPB 1 g/50 mL premix  1 g Intravenous Q8H Durene Romanslin, Matthew, MD   Stopped at 06/10/17 0138  . colesevelam [facility](WELCHOL) tablet 625 mg  625 mg Oral Q M,W,F Lorretta HarpNiu, Xilin, MD   625 mg at 06/08/17 0905  . docusate sodium (COLACE) capsule 100 mg  100 mg Oral BID Durene Romanslin, Matthew, MD   100 mg at 06/09/17 2104  .  enoxaparin (LOVENOX) injection 40 mg  40 mg Subcutaneous Q24H Durene Romanslin, Matthew, MD      . ferrous sulfate tablet 325 mg  325 mg Oral TID Betsey HolidayPC Niu, Xilin, MD   325 mg at 06/08/17 1839  . gabapentin (NEURONTIN) capsule 300 mg  300 mg Oral BID PRN Lorretta HarpNiu, Xilin, MD      . hydrALAZINE (APRESOLINE) injection 5 mg  5 mg Intravenous Q2H PRN Lorretta HarpNiu, Xilin, MD      . HYDROcodone-acetaminophen (NORCO/VICODIN) 5-325 MG per tablet 1-2 tablet  1-2 tablet Oral Q4H PRN Durene Romanslin, Matthew, MD   2 tablet at 06/10/17 [phone] [id number]  . HYDROmorphone (DILAUDID) injection 0.5-2 mg  0.5-2 mg Intravenous Q3H PRN Durene Romanslin, Matthew, MD   1 mg at 06/09/17 1830  . loratadine (CLARITIN) tablet 10 mg  10 mg Oral Daily Lorretta HarpNiu, Xilin, MD   Stopped at 06/09/17 1000  . methocarbamol (ROBAXIN) tablet 500 mg  500 mg Oral Q6H PRN Durene Romanslin, Matthew, MD   500 mg at 06/10/17 0108   Or  . methocarbamol (ROBAXIN) 500 mg in dextrose 5 % 50 mL IVPB  500 mg Intravenous Q6H PRN Durene Romanslin, Matthew, MD   Stopped at 06/09/17 1230  . metoCLOPramide (REGLAN) tablet 5 mg  5 mg Oral Q8H PRN Durene Romanslin, Matthew, MD       Or  . metoCLOPramide (REGLAN) injection 5 mg  5 mg Intravenous Q8H PRN Durene Romanslin, Matthew, MD   5 mg at 06/09/17 1533  . mirtazapine (REMERON) tablet 15 mg  15 mg Oral QHS Lorretta HarpNiu, Xilin, MD   15 mg at 06/09/17 2104  . morphine 2 MG/ML injection 1 mg  1 mg Intravenous Q2H PRN Lorretta HarpNiu, Xilin, MD   1 mg at 06/05/17 2049  . multivitamin with minerals tablet 1 tablet  1 tablet Oral Daily Lorretta HarpNiu, Xilin, MD   Stopped at 06/09/17 1000  . ondansetron (ZOFRAN) tablet 4 mg  4 mg Oral Q6H PRN Durene Romanslin, Matthew, MD       Or  . ondansetron [facility](ZOFRAN) injection 4 mg  4 mg Intravenous Q6H PRN Durene Romanslin, Matthew, MD      . pantoprazole (PROTONIX) EC tablet 40 mg  40 mg Oral Daily Lorretta HarpNiu, Xilin, MD   Stopped at 06/09/17 1000  . polyethylene glycol (MIRALAX / GLYCOLAX) packet 17 g  17 g Oral Daily PRN Lorretta HarpNiu, Xilin, MD   17 g at 06/06/17 1739  . polyethylene glycol (MIRALAX / GLYCOLAX) packet 17 g  17 g Oral Daily Durene Romanslin,  Matthew, MD      . polyvinyl alcohol (LIQUIFILM TEARS) 1.4 % ophthalmic solution 1 drop  1 drop Both Eyes PRN Lorretta HarpNiu, Xilin, MD      .  simethicone (MYLICON) chewable tablet 80 mg  80 mg Oral QID PRN Durene Romanslin, Matthew, MD   80 mg at 06/09/17 2109  . zolpidem (AMBIEN) tablet 5 mg  5 mg Oral QHS Lorretta HarpNiu, Xilin, MD   5 mg at 06/09/17 2104     Discharge Medications: Please see discharge summary for a list of discharge medications.  Relevant Imaging Results:  Relevant Lab Results:   Additional Information SS#: [id number]  Baldemar LenisElizabeth M Judyth Demarais, LCSW

## 2017-06-11 LAB — BASIC METABOLIC PANEL
ANION GAP: 6 (ref 5–15)
BUN: 26 mg/dL — AB (ref 6–20)
CHLORIDE: 104 mmol/L (ref 101–111)
CO2: 25 mmol/L (ref 22–32)
Calcium: 8.3 mg/dL — ABNORMAL LOW (ref 8.9–10.3)
Creatinine, Ser: 1.35 mg/dL — ABNORMAL HIGH (ref 0.44–1.00)
GFR calc Af Amer: 41 mL/min — ABNORMAL LOW (ref >60)
GFR calc non Af Amer: 35 mL/min — ABNORMAL LOW (ref >60)
GLUCOSE: 132 mg/dL — AB (ref 65–99)
POTASSIUM: 4.2 mmol/L (ref 3.5–5.1)
Sodium: 135 mmol/L (ref 135–145)

## 2017-06-11 LAB — BPAM RBC
BLOOD PRODUCT EXPIRATION DATE: 201812262359
Blood Product Expiration Date: 201812232359
Blood Product Expiration Date: 201812232359
Blood Product Expiration Date: 201812242359
ISSUE DATE / TIME: 201812060954
ISSUE DATE / TIME: 201812071648
ISSUE DATE / TIME: 201812060612
ISSUE DATE / TIME: 201812071145
UNIT TYPE AND RH: 5100
Unit Type and Rh: 5100
Unit Type and Rh: 5100
Unit Type and Rh: 5100

## 2017-06-11 LAB — GLUCOSE, CAPILLARY: Glucose-Capillary: 114 mg/dL — ABNORMAL HIGH (ref 65–99)

## 2017-06-11 LAB — TYPE AND SCREEN
ABO/RH(D): O POS
ANTIBODY SCREEN: NEGATIVE
UNIT DIVISION: 0
UNIT DIVISION: 0
Unit division: 0
Unit division: 0

## 2017-06-11 LAB — CBC
HEMATOCRIT: 24.3 % — AB (ref 36.0–46.0)
HEMOGLOBIN: 8 g/dL — AB (ref 12.0–15.0)
MCH: 29.4 pg (ref 26.0–34.0)
MCHC: 32.9 g/dL (ref 30.0–36.0)
MCV: 89.3 fL (ref 78.0–100.0)
Platelets: 169 10*3/uL (ref 150–400)
RBC: 2.72 MIL/uL — ABNORMAL LOW (ref 3.87–5.11)
RDW: 14.2 % (ref 11.5–15.5)
WBC: 12.3 10*3/uL — ABNORMAL HIGH (ref 4.0–10.5)

## 2017-06-11 MED ORDER — ENOXAPARIN SODIUM 40 MG/0.4ML ~~LOC~~ SOLN: 40.0000 mg | SUBCUTANEOUS | 0 refills | Status: DC

## 2017-06-11 MED ORDER — HYDROCODONE-ACETAMINOPHEN 5-325 MG PO TABS: 1.0000 | ORAL_TABLET | Freq: Four times a day (QID) | ORAL | 0 refills | Status: DC | PRN

## 2017-06-11 MED ORDER — METHOCARBAMOL 500 MG PO TABS: 500.0000 mg | ORAL_TABLET | Freq: Four times a day (QID) | ORAL | 0 refills | Status: DC | PRN

## 2017-06-11 NOTE — Progress Notes (Signed)
Physical Therapy Treatment Patient Details Name: Nancy MinorRoxana R Suarez MRN: [id number] DOB: 06/24/1932 Today's Date: 06/11/2017    History of Present Illness 83 y.o. female with medical history significant of L TKA, hypertension, hyperlipidemia, GERD, pancreatitis, iron deficiency anemia, CKD-3, who presents with fall and the left hip pain. Dx L distal periprosthetic femur fracture above TKA, s/p ORIF 06/09/17.     PT Comments    POD # 2 am session [facility] then to recliner + 2 assist and NWB L LE.  Perfomed B AP and knee presses.  Elevated L LE noted increased edema distal to ACE wrap.    Follow Up Recommendations  SNF;Supervision/Assistance - 24 hour     Equipment Recommendations       Recommendations for Other Services       Precautions / Restrictions Precautions Precautions: Fall Precaution Comments: pt reports 2 falls just prior to admission, plus 3 other falls this past 1 year Required Braces or Orthoses: Knee Immobilizer - Left Knee Immobilizer - Left: On at all times Restrictions Weight Bearing Restrictions: Yes LLE Weight Bearing: Non weight bearing Other Position/Activity Restrictions: no flexion L knee    Mobility  Bed Mobility Overal bed mobility: Needs Assistance Bed Mobility: Supine to Sit     Supine to sit: Max assist;+2 for physical assistance;+2 for safety/equipment     General bed mobility comments: utilizing bed pad to complete pivot and scooting to EOB  Transfers Overall transfer level: Needs assistance Equipment used: Rolling walker (2 wheeled) Transfers: Stand Pivot Transfers Sit to Stand: Max assist         General transfer comment: "Bear Hug" stand pivot due to urgency to use BSC 1/4 turn to her R while NWB L LE KI on.   Another "Bear Hug" stand pivot off BSC 1/4 to recliner.    Ambulation/Gait             General Gait Details: transfers only this session due to "bad R shoulder" and weakness   Stairs            Wheelchair Mobility    Modified Rankin (Stroke Patients Only)       Balance                                            Cognition Arousal/Alertness: Awake/alert Behavior During Therapy: WFL for tasks assessed/performed                                   General Comments: pt required 50% VC's for direction but otherwise AxO x3      did state she forgot where she was last night and had an emotional breakdown this morning       Exercises  20 reps B LE AP and knee presses     General Comments        Pertinent Vitals/Pain Pain Assessment: Faces Faces Pain Scale: Hurts little more Pain Location: L LE Pain Descriptors / Indicators: Operative site guarding;Discomfort;Grimacing Pain Intervention(s): Monitored during session;Repositioned    Home Living                      Prior Function            PT Goals (current goals can now be found  in the care plan section) Progress towards PT goals: Progressing toward goals    Frequency    Min 3X/week      PT Plan Current plan remains appropriate    Co-evaluation              AM-PAC PT "6 Clicks" Daily Activity  Outcome Measure  Difficulty turning over in bed (including adjusting bedclothes, sheets and blankets)?: Unable Difficulty moving from lying on back to sitting on the side of the bed? : Unable Difficulty sitting down on and standing up from a chair with arms (e.g., wheelchair, bedside commode, etc,.)?: Unable Help needed moving to and from a bed to chair (including a wheelchair)?: A Lot Help needed walking in hospital room?: Total Help needed climbing 3-5 steps with a railing? : Total 6 Click Score: 7    End of Session Equipment Utilized During Treatment: Gait belt;Left knee immobilizer Activity Tolerance: Patient limited by fatigue Patient left: in chair;with call bell/phone within reach Nurse Communication: Mobility status PT Visit Diagnosis:  Unsteadiness on feet (R26.81);History of falling (Z91.81);Repeated falls (R29.6);Difficulty in walking, not elsewhere classified (R26.2)     Time: [id number] PT Time Calculation (min) (ACUTE ONLY): 35 min  Charges:  $Therapeutic Activity: 23-37 mins                    G Codes:       Felecia ShellingLori Edvin Albus  PTA [facility]  Acute  Rehab Pager      [phone]

## 2017-06-11 NOTE — Progress Notes (Signed)
Patient ID: Nancy Suarez, female   DOB: 03/13/1932, 83 y.o.   MRN: [id number] Subjective: 2 Days Post-Op Procedure(s) (LRB): OPEN REDUCTION INTERNAL FIXATION (ORIF) DISTAL FEMUR FRACTURE (Left)    Patient is a bit confused this am and because of that tearful.  She actually had called her daughter. I had a chance to speak with her about the confusion and normalcy. Tried to calm [name] down, opened blinds and door to help with orientation  Objective:   VITALS:   Vitals:   06/10/17 2100 06/11/17 0605  BP: (!) 120/50 (!) 157/53  Pulse: (!) 105 93  Resp:  18  Temp: (!) 100.4 F (38 C) 99.7 F (37.6 C)  SpO2: (!) 84% 98%    Neurovascular intact Incision: dressing C/D/I  LLE edema  LABS Recent Labs    06/09/17 0523 06/10/17 0506 06/11/17 0404  HGB 10.6* 8.8* 8.0*  HCT 31.6* 26.4* 24.3*  WBC 11.3* 13.5* 12.3*  PLT 148* 171 169    Recent Labs    06/09/17 0523 06/10/17 0506 06/11/17 0404  NA 138 136 135  K 4.2 4.4 4.2  BUN 17 19 26*  CREATININE 1.15* 1.34* 1.35*  GLUCOSE 131* 138* 132*    No results for input(s): LABPT, INR in the last 72 hours.   Assessment/Plan: 2 Days Post-Op Procedure(s) (LRB): OPEN REDUCTION INTERNAL FIXATION (ORIF) DISTAL FEMUR FRACTURE (Left)   Advance diet Up with therapy Discharge to SNF when medically stable NWB LLE Knee immobilizer when out of bed to prevent unnecessary flexion at knee for now RTC in 2 weeks

## 2017-06-11 NOTE — Progress Notes (Addendum)
Patient ID: Nancy Suarez, female   DOB: 05/06/1932, 83 y.o.   MRN: [id number]  PROGRESS NOTE    Nancy Suarez  WUJ:[id number] DOB: 06/22/1932 DOA: 06/05/2017  PCP: System, Pcp Not In   Brief Narrative:  83 y.o.femalewith medical history significant ofhypertension, hyperlipidemia, GERD, pancreatitis, iron deficiency anemia, CKD-3, who presents after a mechanical fall and left hip pain.  Patient states thatshe tripped over her husband's walker at home and falling injuring her left legthe night of 06/05/17. She isS/Pofleft total knee replacement 2-3 years ago by Dr Durene RomansMatthew Olin.  Patient refused blood transfusion 06/06/2017. Her hemoglobin was 6.3 on 06/07/2017 surgery was canceled because of anemia. Patient then accepted blood transfusion. Patient underwent open reduction internal fixation of the left distal femur fracture on 06/09/2017.  Assessment & Plan:   Left distal femoral fracture secondary to mechanical fall - Status post open reduction internal fixation of the left distal femur fracture on 06/09/2017 - Continue pain management efforts - Discharge to SNF likely in next 24 hours  Iron deficiency anemia / anemia of chronic disease - So far during this hospital stay patient has received total of 5 units of PRBC transfusion - Hgb 8 this am - Will check CBC in am to make sure no further drop in hgb  AKI on CKD 3, resolved - Based and creatinine is 1.37 in July 2015 - Cr is within baseline range   Leukocytosis, resolved - Likely reactive   Essential hypertension  - Continue Norvasc   Dyslipidemia - Continue Lipitor   Depression - Continue Mirtazapine   GERD - Continue Protonix     DVT prophylaxis: SCD's Code Status: full code  Family Communication: no family at the bedside; called pt daughter this am to give an update  Disposition Plan: D/C to SNF likely in next 24 hours    Consultants:   Ortho  Procedures:   Surgical repair of left distal femur  06/09/2017  Antimicrobials:   None    Subjective: No overnight events.  Objective: Vitals:   06/10/17 0621 06/10/17 1431 06/10/17 2100 06/11/17 0605  BP: (!) 131/52 122/69 (!) 120/50 (!) 157/53  Pulse: (!) 106 (!) 102 (!) 105 93  Resp: 19 16  18   Temp: 98.8 F (37.1 C) 98.3 F (36.8 C) (!) 100.4 F (38 C) 99.7 F (37.6 C)  TempSrc: Oral Oral Oral Oral  SpO2: 99% 99% (!) 84% 98%  Weight:      Height:        Intake/Output Summary (Last 24 hours) at 06/11/2017 0803 Last data filed at 06/11/2017 [id number] Gross per 24 hour  Intake 720 ml  Output 575 ml  Net 145 ml   Filed Weights   06/05/17 1726  Weight: 74.8 kg (165 lb)   Physical Exam  Constitutional: Appears well-developed and well-nourished. No distress.   CVS: Rate controlled, S1/S2 + Pulmonary: Effort and breath sounds normal, no stridor, rhonchi, wheezes, rales.  Abdominal: Soft. BS +,  no distension, tenderness, rebound or guarding.  Musculoskeletal: palpable pulses, no tenderness  Lymphadenopathy: No lymphadenopathy noted, cervical, inguinal. Neuro: Alert. Normal reflexes, muscle tone coordination. No cranial nerve deficit. Skin: Skin is warm and dry. No rash noted. Not diaphoretic. No erythema. No pallor.  Psychiatric: Normal mood and affect.   Data Reviewed: I have personally reviewed following labs and imaging studies  CBC: Recent Labs  Lab 06/05/17 1817  06/07/17 0535  06/08/17 0515 06/08/17 2146 06/09/17 0523 06/10/17 0506 06/11/17 0404  WBC 11.1*   < >  8.4  --  9.4  --  11.3* 13.5* 12.3*  NEUTROABS 9.5*  --   --   --   --   --   --   --   --   HGB 9.3*   < > 6.3*   < > 7.9* 10.3* 10.6* 8.8* 8.0*  HCT 28.9*   < > 19.2*   < > 23.7* 30.4* 31.6* 26.4* 24.3*  MCV 90.3   < > 90.1  --  88.1  --  87.5 89.5 89.3  PLT 245   < > 130*  --  129*  --  148* 171 169   < > = values in this interval not displayed.   Basic Metabolic Panel: Recent Labs  Lab 06/07/17 0535 06/08/17 0515 06/09/17 0523  06/10/17 0506 06/11/17 0404  NA 138 138 138 136 135  K 4.0 4.1 4.2 4.4 4.2  CL 110 109 105 104 104  CO2 24 24 25 25 25   GLUCOSE 126* 113* 131* 138* 132*  BUN 16 15 17 19  26*  CREATININE 1.21* 1.14* 1.15* 1.34* 1.35*  CALCIUM 8.5* 8.8* 8.7* 8.2* 8.3*   GFR: Estimated Creatinine Clearance: 32 mL/min (A) (by C-G formula based on SCr of 1.35 mg/dL (H)). Liver Function Tests: Recent Labs  Lab 06/05/17 1817  AST 24  ALT 19  ALKPHOS 70  BILITOT 0.6  PROT 6.7  ALBUMIN 3.9   No results for input(s): LIPASE, AMYLASE in the last 168 hours. No results for input(s): AMMONIA in the last 168 hours. Coagulation Profile: Recent Labs  Lab 06/05/17 2215  INR 1.08   Cardiac Enzymes: No results for input(s): CKTOTAL, CKMB, CKMBINDEX, TROPONINI in the last 168 hours. BNP (last 3 results) No results for input(s): PROBNP in the last 8760 hours. HbA1C: No results for input(s): HGBA1C in the last 72 hours. CBG: Recent Labs  Lab 06/06/17 0953 06/07/17 0712 06/08/17 0738 06/09/17 0734 06/10/17 0723  GLUCAP 136* 114* 100* 124* 134*   Lipid Profile: No results for input(s): CHOL, HDL, LDLCALC, TRIG, CHOLHDL, LDLDIRECT in the last 72 hours. Thyroid Function Tests: No results for input(s): TSH, T4TOTAL, FREET4, T3FREE, THYROIDAB in the last 72 hours. Anemia Panel: No results for input(s): VITAMINB12, FOLATE, FERRITIN, TIBC, IRON, RETICCTPCT in the last 72 hours. Urine analysis:    Component Value Date/Time   COLORURINE YELLOW 01/12/2014 1537   APPEARANCEUR CLEAR 01/12/2014 1537   LABSPEC 1.024 01/12/2014 1537   PHURINE 5.0 01/12/2014 1537   GLUCOSEU NEGATIVE 01/12/2014 1537   HGBUR NEGATIVE 01/12/2014 1537   BILIRUBINUR NEGATIVE 01/12/2014 1537   KETONESUR NEGATIVE 01/12/2014 1537   PROTEINUR NEGATIVE 01/12/2014 1537   UROBILINOGEN 0.2 01/12/2014 1537   NITRITE NEGATIVE 01/12/2014 1537   LEUKOCYTESUR SMALL (A) 01/12/2014 1537   Sepsis  Labs: @LABRCNTIP (procalcitonin:4,lacticidven:4)   ) Recent Results (from the past 240 hour(s))  MRSA PCR Screening     Status: None   Collection Time: 06/09/17  7:25 AM  Result Value Ref Range Status   MRSA by PCR NEGATIVE NEGATIVE Final    Comment:        The GeneXpert MRSA Assay (FDA approved for NASAL specimens only), is one component of a comprehensive MRSA colonization surveillance program. It is not intended to diagnose MRSA infection nor to guide or monitor treatment for MRSA infections.       Radiology Studies: Dg Knee 1-2 Views Left  Result Date: 06/09/2017 CLINICAL DATA:  Right femur ORIF. EXAM: DG C-ARM 61-120 MIN-NO REPORT; LEFT KNEE -  1-2 VIEW COMPARISON:  Two-view right femur 06/05/2017 FLUOROSCOPY TIME:  Fluoroscopy Time:  1 minutes 35 seconds Radiation Exposure Index (if provided by the fluoroscopic device): 7.92 mGy Number of Acquired Spot Images: 0 FINDINGS: The distal aspect of a retrograde IM rod is demonstrated on 3 intraoperative fluoro spot images. There is reduction of the comminuted distal femur fracture. AP reduction is near anatomic. Slight angulation remains. A cerclage wire is evident at the level of the fracture. Four distal screws are present. IMPRESSION: 1. Interval placement of intramedullary rod and cerclage wire with significant reduction of the comminuted distal femur fracture. 2. No radiographic evidence for complication. Electronically Signed   By: Marin Roberts M.D.   On: 06/09/2017 13:10   Dg C-arm 61-120 Min-no Report  Result Date: 06/09/2017 Fluoroscopy was utilized by the requesting physician.  No radiographic interpretation.      Scheduled Meds: . amLODipine  5 mg Oral Daily  . atorvastatin  20 mg Oral QPM  . calcium-vitamin D  1 tablet Oral BID WC  . colesevelam  625 mg Oral Q M,W,F  . docusate sodium  100 mg Oral BID  . enoxaparin (LOVENOX) injection  40 mg Subcutaneous Q24H  . ferrous sulfate  325 mg Oral TID PC  .  loratadine  10 mg Oral Daily  . mirtazapine  15 mg Oral QHS  . multivitamin with minerals  1 tablet Oral Daily  . pantoprazole  40 mg Oral Daily  . polyethylene glycol  17 g Oral Daily  . zolpidem  5 mg Oral QHS   Continuous Infusions: . sodium chloride    . sodium chloride    . sodium chloride Stopped (06/10/17 1100)  . methocarbamol (ROBAXIN)  IV Stopped (06/09/17 1230)     LOS: 6 days    Time spent: 25 minutes  Greater than 50% of the time spent on counseling and coordinating the care.   Manson Passey, MD [facility] Pager [phone]  If 7PM-7AM, please contact night-coverage [email or web address] Password [email or web address] 06/11/2017, 8:03 AM

## 2017-06-11 NOTE — Progress Notes (Signed)
Physical Therapy Treatment Patient Details Name: Nancy MinorRoxana R Suarez MRN: [id number] DOB: 07/19/1931 Today's Date: 06/11/2017    History of Present Illness 83 y.o. female with medical history significant of L TKA, hypertension, hyperlipidemia, GERD, pancreatitis, iron deficiency anemia, CKD-3, who presents with fall and the left hip pain. Dx L distal periprosthetic femur fracture above TKA, s/p ORIF 06/09/17.     PT Comments    POD # 2 pm session Assisted pt back to bed this time pt was able to stand on R LE only and pivot self with MOD assist + 1 1/4 turn back to bed towards her R with 75% VC's on proper tech and turn completion.  Positioned in supine and elevated L LE.     Follow Up Recommendations  SNF;Supervision/Assistance - 24 hour     Equipment Recommendations       Recommendations for Other Services       Precautions / Restrictions Precautions Precautions: Fall Precaution Comments: pt reports 2 falls just prior to admission, plus 3 other falls this past 1 year Required Braces or Orthoses: Knee Immobilizer - Left Knee Immobilizer - Left: On at all times Restrictions Weight Bearing Restrictions: Yes LLE Weight Bearing: Non weight bearing Other Position/Activity Restrictions: no flexion L knee    Mobility  Bed Mobility Overal bed mobility: Needs Assistance Bed Mobility:  Sit to supine       Transfers Overall transfer level: Needs assistance Equipment used: Rolling walker (2 wheeled) Transfers: Stand Pivot Transfers Sit to Stand: Max assist         Ambulation/Gait             General Gait Details: transfers only this session due to "bad R shoulder" and weakness   Stairs            Wheelchair Mobility    Modified Rankin (Stroke Patients Only)       Balance                                            Cognition Arousal/Alertness: Awake/alert Behavior During Therapy: WFL for tasks assessed/performed                                    General Comments: pt required 50% VC's for direction but otherwise AxO x3      did state she forgot where she was last night and had an emotional breakdown this morning       Exercises      General Comments        Pertinent Vitals/Pain Pain Assessment: Faces Faces Pain Scale: Hurts little more Pain Location: L LE Pain Descriptors / Indicators: Operative site guarding;Discomfort;Grimacing Pain Intervention(s): Monitored during session;Repositioned    Home Living                      Prior Function            PT Goals (current goals can now be found in the care plan section) Progress towards PT goals: Progressing toward goals    Frequency    Min 3X/week      PT Plan Current plan remains appropriate    Co-evaluation              AM-PAC PT "6 Clicks" Daily Activity  Outcome  Measure  Difficulty turning over in bed (including adjusting bedclothes, sheets and blankets)?: Unable Difficulty moving from lying on back to sitting on the side of the bed? : Unable Difficulty sitting down on and standing up from a chair with arms (e.g., wheelchair, bedside commode, etc,.)?: Unable Help needed moving to and from a bed to chair (including a wheelchair)?: A Lot Help needed walking in hospital room?: Total Help needed climbing 3-5 steps with a railing? : Total 6 Click Score: 7    End of Session Equipment Utilized During Treatment: Gait belt;Left knee immobilizer Activity Tolerance: Patient limited by fatigue Patient left: in bed;with call bell/phone within reach Nurse Communication: Mobility status PT Visit Diagnosis: Unsteadiness on feet (R26.81);History of falling (Z91.81);Repeated falls (R29.6);Difficulty in walking, not elsewhere classified (R26.2)     Time: 1446-1500 PT Time Calculation (min) (ACUTE ONLY): 14 min  Charges:  $Therapeutic Activity: 8-22 mins                    G Codes:       Dj Senteno KropskiFelecia Shelling  PTA [facility]  Acute   Rehab Pager      [phone]

## 2017-06-12 DIAGNOSIS — Z4789 Encounter for other orthopedic aftercare: Secondary | ICD-10-CM | POA: Diagnosis not present

## 2017-06-12 DIAGNOSIS — M179 Osteoarthritis of knee, unspecified: Secondary | ICD-10-CM | POA: Diagnosis not present

## 2017-06-12 DIAGNOSIS — I999 Unspecified disorder of circulatory system: Secondary | ICD-10-CM | POA: Diagnosis not present

## 2017-06-12 DIAGNOSIS — N952 Postmenopausal atrophic vaginitis: Secondary | ICD-10-CM | POA: Diagnosis not present

## 2017-06-12 DIAGNOSIS — G8911 Acute pain due to trauma: Secondary | ICD-10-CM | POA: Diagnosis not present

## 2017-06-12 DIAGNOSIS — K219 Gastro-esophageal reflux disease without esophagitis: Secondary | ICD-10-CM | POA: Diagnosis not present

## 2017-06-12 DIAGNOSIS — M47819 Spondylosis without myelopathy or radiculopathy, site unspecified: Secondary | ICD-10-CM | POA: Diagnosis not present

## 2017-06-12 DIAGNOSIS — S72402D Unspecified fracture of lower end of left femur, subsequent encounter for closed fracture with routine healing: Secondary | ICD-10-CM | POA: Diagnosis not present

## 2017-06-12 DIAGNOSIS — M859 Disorder of bone density and structure, unspecified: Secondary | ICD-10-CM | POA: Diagnosis not present

## 2017-06-12 DIAGNOSIS — S72492D Other fracture of lower end of left femur, subsequent encounter for closed fracture with routine healing: Secondary | ICD-10-CM | POA: Diagnosis not present

## 2017-06-12 DIAGNOSIS — M25552 Pain in left hip: Secondary | ICD-10-CM | POA: Diagnosis not present

## 2017-06-12 DIAGNOSIS — Z96659 Presence of unspecified artificial knee joint: Secondary | ICD-10-CM | POA: Diagnosis not present

## 2017-06-12 DIAGNOSIS — M4120 Other idiopathic scoliosis, site unspecified: Secondary | ICD-10-CM | POA: Diagnosis not present

## 2017-06-12 DIAGNOSIS — I129 Hypertensive chronic kidney disease with stage 1 through stage 4 chronic kidney disease, or unspecified chronic kidney disease: Secondary | ICD-10-CM | POA: Diagnosis not present

## 2017-06-12 DIAGNOSIS — D509 Iron deficiency anemia, unspecified: Secondary | ICD-10-CM | POA: Diagnosis not present

## 2017-06-12 DIAGNOSIS — R2689 Other abnormalities of gait and mobility: Secondary | ICD-10-CM | POA: Diagnosis not present

## 2017-06-12 DIAGNOSIS — S72402A Unspecified fracture of lower end of left femur, initial encounter for closed fracture: Secondary | ICD-10-CM | POA: Diagnosis not present

## 2017-06-12 DIAGNOSIS — N183 Chronic kidney disease, stage 3 (moderate): Secondary | ICD-10-CM | POA: Diagnosis not present

## 2017-06-12 DIAGNOSIS — E7800 Pure hypercholesterolemia, unspecified: Secondary | ICD-10-CM | POA: Diagnosis not present

## 2017-06-12 DIAGNOSIS — Z9181 History of falling: Secondary | ICD-10-CM | POA: Diagnosis not present

## 2017-06-12 DIAGNOSIS — S728X9A Other fracture of unspecified femur, initial encounter for closed fracture: Secondary | ICD-10-CM | POA: Diagnosis not present

## 2017-06-12 DIAGNOSIS — M109 Gout, unspecified: Secondary | ICD-10-CM | POA: Diagnosis not present

## 2017-06-12 DIAGNOSIS — F329 Major depressive disorder, single episode, unspecified: Secondary | ICD-10-CM | POA: Diagnosis not present

## 2017-06-12 DIAGNOSIS — G8929 Other chronic pain: Secondary | ICD-10-CM | POA: Diagnosis not present

## 2017-06-12 DIAGNOSIS — G4700 Insomnia, unspecified: Secondary | ICD-10-CM | POA: Diagnosis not present

## 2017-06-12 LAB — CBC
HEMATOCRIT: 24.6 % — AB (ref 36.0–46.0)
HEMOGLOBIN: 7.9 g/dL — AB (ref 12.0–15.0)
MCH: 29 pg (ref 26.0–34.0)
MCHC: 32.1 g/dL (ref 30.0–36.0)
MCV: 90.4 fL (ref 78.0–100.0)
PLATELETS: 244 10*3/uL (ref 150–400)
RBC: 2.72 MIL/uL — AB (ref 3.87–5.11)
RDW: 13.9 % (ref 11.5–15.5)
WBC: 9.8 10*3/uL (ref 4.0–10.5)

## 2017-06-12 LAB — GLUCOSE, CAPILLARY: GLUCOSE-CAPILLARY: 105 mg/dL — AB (ref 65–99)

## 2017-06-12 MED ORDER — SIMETHICONE 80 MG PO CHEW: 80.0000 mg | CHEWABLE_TABLET | Freq: Four times a day (QID) | ORAL | 0 refills | Status: DC | PRN

## 2017-06-12 MED ORDER — SODIUM CHLORIDE 0.9 % IV SOLN: Freq: Once | INTRAVENOUS | Status: DC

## 2017-06-12 MED ORDER — ZOLPIDEM TARTRATE 10 MG PO TABS: 10.0000 mg | ORAL_TABLET | Freq: Every day | ORAL | 0 refills | Status: DC

## 2017-06-12 MED ORDER — ONDANSETRON HCL 4 MG PO TABS: 4.0000 mg | ORAL_TABLET | Freq: Four times a day (QID) | ORAL | 0 refills | Status: DC | PRN

## 2017-06-12 MED ORDER — ACETAMINOPHEN 325 MG PO TABS: 650.0000 mg | ORAL_TABLET | ORAL | 0 refills | Status: DC | PRN

## 2017-06-12 MED ORDER — AMLODIPINE BESYLATE 5 MG PO TABS: 5.0000 mg | ORAL_TABLET | Freq: Every day | ORAL | 0 refills | Status: DC

## 2017-06-12 NOTE — Progress Notes (Signed)
Discussed SNF placement choices with pt's daughter. Selects [facility] (hopes that pt's husband, also admitted with potential SNF placement at DC as well), will be able to admit there as well but understanding that facility cannot guarantee.  [employer] Medicare provided SNF auth#[id number].   Provided [facility] with pt's DC information. Once confirmed received and bed ready, will facilitate DC there.  Ilean SkillMeghan Milon Dethloff, MSW, LCSW Clinical Social Work 06/12/2017 [phone]

## 2017-06-12 NOTE — Discharge Instructions (Signed)
Non weight bearing Left lower extremity Knee immobilizer for any out of bed activity until further directed May shower and/or have left lower extremity cleaned, bo soaking baths until incision fully healed

## 2017-06-12 NOTE — Progress Notes (Signed)
Patient ID: Nancy Suarez, female   DOB: 08/05/1931, 83 y.o.   MRN: [id number] Subjective: 3 Days Post-Op Procedure(s) (LRB): OPEN REDUCTION INTERNAL FIXATION (ORIF) DISTAL FEMUR FRACTURE (Left)    Patient reports pain as mild to moderate depending on activity level.  Seems much more at ease this am, states that she is better oriented  Objective:   VITALS:   Vitals:   06/11/17 1953 06/12/17 0558  BP: (!) 149/62 (!) 152/69  Pulse: 89 87  Resp: 20 20  Temp: 99.1 F (37.3 C) 99.6 F (37.6 C)  SpO2: 100% 98%    Neurovascular intact Incision: dressing C/D/I  LABS Recent Labs    06/10/17 0506 06/11/17 0404  HGB 8.8* 8.0*  HCT 26.4* 24.3*  WBC 13.5* 12.3*  PLT 171 169    Recent Labs    06/10/17 0506 06/11/17 0404  NA 136 135  K 4.4 4.2  BUN 19 26*  CREATININE 1.34* 1.35*  GLUCOSE 138* 132*    No results for input(s): LABPT, INR in the last 72 hours.   Assessment/Plan: 3 Days Post-Op Procedure(s) (LRB): OPEN REDUCTION INTERNAL FIXATION (ORIF) DISTAL FEMUR FRACTURE (Left)   Up with therapy Discharge to SNF most likely when stable Daughter working with Child psychotherapistocial worker team for placement options [facility] LLE Knee immobilizer with OOB activity

## 2017-06-12 NOTE — Discharge Summary (Addendum)
Physician Discharge Summary  Nancy Suarez VFI:[id number] DOB: July 28, 1931 DOA: 06/05/2017  PCP: System, Pcp Not In  Admit date: 06/05/2017 Discharge date: 06/12/2017  Recommendations for Outpatient Follow-up:  Please continue to monitor CBC per SNF protocol. Stop aspirin if hemoglobin level drops. Would continue to hold lasix, lisinopril/hctz and monitor renal function Use Norvasc for blood pressure control   Hgb 7.9 today, pt declined to have transfusion Please monitor in SNF CBC. If hgb drops further than hold aspirin.   Discharge Diagnoses:  Principal Problem:   Closed fracture of left distal femur (HCC) Active Problems:   GERD (gastroesophageal reflux disease)   Overweight (BMI 25.0-29.9)   Essential hypertension   HLD (hyperlipidemia)   Depression   Iron deficiency anemia   Acute renal failure superimposed on stage 3 chronic kidney disease (HCC)   Fall    Discharge Condition: stable   Diet recommendation: as tolerated   History of present illness:  83 y.o.femalewith medical history significant ofhypertension, hyperlipidemia, GERD, pancreatitis, iron deficiency anemia, CKD-3, who presents after a mechanical fall and left hip pain.  Patient states thatshe tripped over her husband's walker at home and falling injuring her left legthe night of 06/05/17. She isS/Pofleft total knee replacement 2-3 years ago by Dr Durene Romans.  Patient refused blood transfusion 06/06/2017. Her hemoglobin was 6.3 on 06/07/2017 surgery was canceled because of anemia. Patient then accepted blood transfusion. Patient underwent open reduction internal fixation of the left distal femur fracture on 06/09/2017.    Hospital Course:  Left distal femoral fracture secondary to mechanical fall - Status post open reduction internal fixation of the left distal femur fracture on 06/09/2017 - Continue pain management efforts - Discharge to SNF likely in next 24 hours  Iron deficiency anemia /  anemia of chronic disease - So far during this hospital stay patient has received total of 5 units of PRBC transfusion - CBC pending this am, will follow up prior to discharge   AKI on CKD 3, resolved - Based and creatinine is 1.37 in July 2015 - Cr 1.35 on discharge   Leukocytosis, resolved - Reactive   Essential hypertension  - Holding lisinopril/hctz and lasix due to renal insufficiency - Cr. 1.3 prior to discharge - Use Norvasc for blood pressure control   Dyslipidemia - Continue statin therapy    Depression - Continue Mirtazapine   GERD - Continue Protonix     DVT prophylaxis: SCD's Code Status: full code  Family Communication: no family at the bedside this am    Consultants:   Ortho  Procedures:   Surgical repair of left distal femur 06/09/2017  Antimicrobials:   None       Signed:  Manson Passey, MD  [facility] 06/12/2017, 10:51 AM  Pager #: [phone]  Time spent in minutes: more than 30 minutes    Discharge Exam: Vitals:   06/11/17 1953 06/12/17 0558  BP: (!) 149/62 (!) 152/69  Pulse: 89 87  Resp: 20 20  Temp: 99.1 F (37.3 C) 99.6 F (37.6 C)  SpO2: 100% 98%   Vitals:   06/11/17 0605 06/11/17 1514 06/11/17 1953 06/12/17 0558  BP: (!) 157/53 (!) 155/58 (!) 149/62 (!) 152/69  Pulse: 93 92 89 87  Resp: 18 20 20 20   Temp: 99.7 F (37.6 C) 99.6 F (37.6 C) 99.1 F (37.3 C) 99.6 F (37.6 C)  TempSrc: Oral Oral Oral Oral  SpO2: 98% 98% 100% 98%  Weight:      Height:  General: Pt is alert, follows commands appropriately, not in acute distress Cardiovascular: Regular rate and rhythm, S1/S2 + Respiratory: Clear to auscultation bilaterally, no wheezing, no crackles, no rhonchi Abdominal: Soft, non tender, non distended, bowel sounds +, no guarding Extremities: no cyanosis, pulses palpable bilaterally DP and PT Neuro: Grossly nonfocal  Discharge Instructions  Discharge Instructions    Call MD  for:  persistant nausea and vomiting   Complete by:  As directed    Call MD for:  redness, tenderness, or signs of infection (pain, swelling, redness, odor or green/yellow discharge around incision site)   Complete by:  As directed    Call MD for:  severe uncontrolled pain   Complete by:  As directed    Diet - low sodium heart healthy   Complete by:  As directed    Discharge instructions   Complete by:  As directed    Please continue to monitor CBC per SNF protocol. Stop aspirin if hemoglobin level drops.   Increase activity slowly   Complete by:  As directed      Allergies as of 06/12/2017      Reactions   Aspirin    Upsets stomach if 325mg    Codeine Nausea And Vomiting   Sulfa Antibiotics Nausea Only      Medication List    STOP taking these medications   furosemide 20 MG tablet Commonly known as:  LASIX   losartan-hydrochlorothiazide 100-25 MG tablet Commonly known as:  HYZAAR   olmesartan-hydrochlorothiazide 40-25 MG tablet Commonly known as:  BENICAR HCT   oxyCODONE 5 MG immediate release tablet Commonly known as:  Oxy IR/ROXICODONE   oxyCODONE-acetaminophen 5-325 MG tablet Commonly known as:  PERCOCET/ROXICET   oxyCODONE-acetaminophen 7.5-325 MG tablet Commonly known as:  PERCOCET   rivaroxaban 10 MG Tabs tablet Commonly known as:  XARELTO     TAKE these medications   acetaminophen 325 MG tablet Commonly known as:  TYLENOL Take 2 tablets (650 mg total) by mouth every 4 (four) hours as needed for mild pain ((score 1 to 3) or temp > 100.5).   amLODipine 5 MG tablet Commonly known as:  NORVASC Take 1 tablet (5 mg total) by mouth daily. Start taking on:  06/13/2017   aspirin 81 MG chewable tablet Chew 1 tablet (81 mg total) by mouth every other day.   atorvastatin 20 MG tablet Commonly known as:  LIPITOR Take 20 mg by mouth every evening. What changed:  Another medication with the same name was removed. Continue taking this medication, and follow the  directions you see here.   calcium-vitamin D 500-200 MG-UNIT tablet Commonly known as:  OSCAL WITH D Take 1 tablet by mouth 2 (two) times daily.   carboxymethylcellulose 0.5 % Soln Commonly known as:  REFRESH PLUS Place 1 drop into both eyes as needed (Dry eyes).   cetirizine 10 MG tablet Commonly known as:  ZYRTEC Take 10 mg by mouth as needed for allergies.   colesevelam 625 MG tablet Commonly known as:  WELCHOL Take 625 mg by mouth every Monday, Wednesday, and Friday.   DSS 100 MG Caps Take 100 mg by mouth 2 (two) times daily.   enoxaparin 40 MG/0.4ML injection Commonly known as:  LOVENOX Inject 0.4 mLs (40 mg total) into the skin daily.   ferrous sulfate 325 (65 FE) MG tablet Take 1 tablet (325 mg total) by mouth 3 (three) times daily after meals.   gabapentin 300 MG capsule Commonly known as:  NEURONTIN Take 300 mg  by mouth 2 (two) times daily as needed (pain).   HYDROcodone-acetaminophen 5-325 MG tablet Commonly known as:  NORCO/VICODIN Take 1-2 tablets by mouth every 6 (six) hours as needed for moderate pain ((score 4 to 6)).   methocarbamol 500 MG tablet Commonly known as:  ROBAXIN Take 1 tablet (500 mg total) by mouth every 6 (six) hours as needed for muscle spasms.   mirtazapine 15 MG tablet Commonly known as:  REMERON Take 15 mg by mouth at bedtime.   multivitamin with minerals Tabs tablet Take 1 tablet by mouth daily.   omeprazole 20 MG capsule Commonly known as:  PRILOSEC Take 20 mg by mouth daily.   ondansetron 4 MG tablet Commonly known as:  ZOFRAN Take 1 tablet (4 mg total) by mouth every 6 (six) hours as needed for nausea.   polyethylene glycol packet Commonly known as:  MIRALAX / GLYCOLAX Take 17 g by mouth 2 (two) times daily.   simethicone 80 MG chewable tablet Commonly known as:  MYLICON Chew 1 tablet (80 mg total) by mouth 4 (four) times daily as needed for flatulence.   tiZANidine 4 MG tablet Commonly known as:  ZANAFLEX Take 1  tablet (4 mg total) by mouth every 6 (six) hours as needed.   zolpidem 10 MG tablet Commonly known as:  AMBIEN Take 1 tablet (10 mg total) by mouth at bedtime.       Follow-up Information    Durene Romans, MD Follow up in 2 week(s).   Specialty:  Orthopedic Surgery Why:  wound check, staple removal, X-rays of left distal femur Contact information: [address] [place] [address] [phone]            The results of significant diagnostics from this hospitalization (including imaging, microbiology, ancillary and laboratory) are listed below for reference.    Significant Diagnostic Studies: Dg Knee 1-2 Views Left  Result Date: 06/09/2017 CLINICAL DATA:  Right femur ORIF. EXAM: DG C-ARM 61-120 MIN-NO REPORT; LEFT KNEE - 1-2 VIEW COMPARISON:  Two-view right femur 06/05/2017 FLUOROSCOPY TIME:  Fluoroscopy Time:  1 minutes 35 seconds Radiation Exposure Index (if provided by the fluoroscopic device): 7.92 mGy Number of Acquired Spot Images: 0 FINDINGS: The distal aspect of a retrograde IM rod is demonstrated on 3 intraoperative fluoro spot images. There is reduction of the comminuted distal femur fracture. AP reduction is near anatomic. Slight angulation remains. A cerclage wire is evident at the level of the fracture. Four distal screws are present. IMPRESSION: 1. Interval placement of intramedullary rod and cerclage wire with significant reduction of the comminuted distal femur fracture. 2. No radiographic evidence for complication. Electronically Signed   By: Marin Roberts M.D.   On: 06/09/2017 13:10   Dg Knee 1-2 Views Left  Result Date: 06/05/2017 CLINICAL DATA:  Recent fall with distal thigh pain, initial encounter EXAM: LEFT KNEE - 1-2 VIEW COMPARISON:  None. FINDINGS: There is a comminuted fracture of the distal left femur involving the junction of the diaphysis and metaphysis. The fracture lines appear to extend to the level of the femoral prosthesis.  Some mild posterior displacement of the distal fracture fragments is noted. IMPRESSION: Comminuted distal femoral fracture which appears to extend towards the femoral portion of the knee prosthesis. Electronically Signed   By: Alcide Clever M.D.   On: 06/05/2017 18:19   Ct Head Wo Contrast  Result Date: 06/06/2017 CLINICAL DATA:  Larey Seat yesterday with trauma to the right side of the head and neck.  EXAM: CT HEAD WITHOUT CONTRAST CT CERVICAL SPINE WITHOUT CONTRAST TECHNIQUE: Multidetector CT imaging of the head and cervical spine was performed following the standard protocol without intravenous contrast. Multiplanar CT image reconstructions of the cervical spine were also generated. COMPARISON:  None. FINDINGS: CT HEAD FINDINGS Brain: Mild age related atrophy. Mild chronic appearing small vessel change of the hemispheric white matter. No sign of acute infarction, mass lesion, hemorrhage, hydrocephalus or extra-axial collection. Vascular: There is atherosclerotic calcification of the major vessels at the base of the brain. Skull: Negative Sinuses/Orbits: Clear/normal Other: None CT CERVICAL SPINE FINDINGS Alignment: No traumatic malalignment. Mild curvature convex to the right. 3 mm anterolisthesis C1-2. One or 2 mm degenerative anterolisthesis C3-4 and C6-7 because of facet arthropathy. Skull base and vertebrae: No fracture. See below for degenerative findings. Soft tissues and spinal canal: Negative Disc levels: Foramen magnum and C1-2: Degenerative C1-2 arthropathy more pronounced on the left than the right. 3 mm of anterolisthesis of C1 relative to C2 without significant canal compromise. Findings could certainly relate to craniocervical pain. C2-3:  Mild disc bulge.  No stenosis. C3-4: Facet arthropathy on the right with 2 mm anterolisthesis. No canal stenosis. Foraminal narrowing on the right. C4-5: Degenerative spondylosis. Foraminal narrowing left more than right. C5-6: Degenerative spondylosis. Left-sided  facet degeneration. Foraminal narrowing left more than right. C6-7: Facet degeneration with 1 or 2 mm of anterolisthesis. No compressive stenosis. C7-T1: Facet degeneration without slippage. No significant stenosis. T1 and T2:  Negative for traumatic finding.  Facet degeneration. Upper chest: Enlarged thyroid with upper mediastinal extension, not primarily evaluated. Presumed benign goiter. Other: None IMPRESSION: Head CT: No acute or traumatic finding. Mild age related atrophy and small-vessel ischemic change. Cervical spine CT: No acute or traumatic finding. Extensive degenerative changes throughout the region as outlined above. Electronically Signed   By: Paulina FusiMark  Shogry M.D.   On: 06/06/2017 12:16   Ct Cervical Spine Wo Contrast  Result Date: 06/06/2017 CLINICAL DATA:  Larey SeatFell yesterday with trauma to the right side of the head and neck. EXAM: CT HEAD WITHOUT CONTRAST CT CERVICAL SPINE WITHOUT CONTRAST TECHNIQUE: Multidetector CT imaging of the head and cervical spine was performed following the standard protocol without intravenous contrast. Multiplanar CT image reconstructions of the cervical spine were also generated. COMPARISON:  None. FINDINGS: CT HEAD FINDINGS Brain: Mild age related atrophy. Mild chronic appearing small vessel change of the hemispheric white matter. No sign of acute infarction, mass lesion, hemorrhage, hydrocephalus or extra-axial collection. Vascular: There is atherosclerotic calcification of the major vessels at the base of the brain. Skull: Negative Sinuses/Orbits: Clear/normal Other: None CT CERVICAL SPINE FINDINGS Alignment: No traumatic malalignment. Mild curvature convex to the right. 3 mm anterolisthesis C1-2. One or 2 mm degenerative anterolisthesis C3-4 and C6-7 because of facet arthropathy. Skull base and vertebrae: No fracture. See below for degenerative findings. Soft tissues and spinal canal: Negative Disc levels: Foramen magnum and C1-2: Degenerative C1-2 arthropathy more  pronounced on the left than the right. 3 mm of anterolisthesis of C1 relative to C2 without significant canal compromise. Findings could certainly relate to craniocervical pain. C2-3:  Mild disc bulge.  No stenosis. C3-4: Facet arthropathy on the right with 2 mm anterolisthesis. No canal stenosis. Foraminal narrowing on the right. C4-5: Degenerative spondylosis. Foraminal narrowing left more than right. C5-6: Degenerative spondylosis. Left-sided facet degeneration. Foraminal narrowing left more than right. C6-7: Facet degeneration with 1 or 2 mm of anterolisthesis. No compressive stenosis. C7-T1: Facet degeneration without slippage. No significant  stenosis. T1 and T2:  Negative for traumatic finding.  Facet degeneration. Upper chest: Enlarged thyroid with upper mediastinal extension, not primarily evaluated. Presumed benign goiter. Other: None IMPRESSION: Head CT: No acute or traumatic finding. Mild age related atrophy and small-vessel ischemic change. Cervical spine CT: No acute or traumatic finding. Extensive degenerative changes throughout the region as outlined above. Electronically Signed   By: Paulina Fusi M.D.   On: 06/06/2017 12:16   Dg C-arm 61-120 Min-no Report  Result Date: 06/09/2017 CLINICAL DATA:  Right femur ORIF. EXAM: DG C-ARM 61-120 MIN-NO REPORT; LEFT KNEE - 1-2 VIEW COMPARISON:  Two-view right femur 06/05/2017 FLUOROSCOPY TIME:  Fluoroscopy Time:  1 minutes 35 seconds Radiation Exposure Index (if provided by the fluoroscopic device): 7.92 mGy Number of Acquired Spot Images: 0 FINDINGS: The distal aspect of a retrograde IM rod is demonstrated on 3 intraoperative fluoro spot images. There is reduction of the comminuted distal femur fracture. AP reduction is near anatomic. Slight angulation remains. A cerclage wire is evident at the level of the fracture. Four distal screws are present. IMPRESSION: 1. Interval placement of intramedullary rod and cerclage wire with significant reduction of the  comminuted distal femur fracture. 2. No radiographic evidence for complication. Electronically Signed   By: Marin Roberts M.D.   On: 06/09/2017 13:10   Dg Hip Unilat W Or Wo Pelvis 2-3 Views Left  Result Date: 06/05/2017 CLINICAL DATA:  Recent fall with left leg pain, initial encounter EXAM: DG HIP (WITH OR WITHOUT PELVIS) 3V LEFT COMPARISON:  None. FINDINGS: Degenerative changes of lumbar spine are noted. The pelvic ring is intact. Mild degenerative changes of the hip joints are noted bilaterally. No acute fracture is noted. No soft tissue changes are seen. IMPRESSION: Degenerative change without acute abnormality. Electronically Signed   By: Alcide Clever M.D.   On: 06/05/2017 18:20   Dg Femur Min 2 Views Left  Result Date: 06/05/2017 CLINICAL DATA:  Recent fall with left leg pain, initial encounter EXAM: LEFT FEMUR 2 VIEWS COMPARISON:  None. FINDINGS: Proximal femur is within normal limits. Comminuted distal femoral fracture is noted at the junction of the diaphysis and metaphysis with extension towards the femoral portion of the knee prosthesis. Some posterior displacement of the distal fracture fragments is noted. No other focal abnormality is seen. IMPRESSION: Comminuted distal left femoral fracture as described. Electronically Signed   By: Alcide Clever M.D.   On: 06/05/2017 18:19    Microbiology: Recent Results (from the past 240 hour(s))  MRSA PCR Screening     Status: None   Collection Time: 06/09/17  7:25 AM  Result Value Ref Range Status   MRSA by PCR NEGATIVE NEGATIVE Final     Labs: Basic Metabolic Panel: Recent Labs  Lab 06/07/17 0535 06/08/17 0515 06/09/17 0523 06/10/17 0506 06/11/17 0404  NA 138 138 138 136 135  K 4.0 4.1 4.2 4.4 4.2  CL 110 109 105 104 104  CO2 24 24 25 25 25   GLUCOSE 126* 113* 131* 138* 132*  BUN 16 15 17 19  26*  CREATININE 1.21* 1.14* 1.15* 1.34* 1.35*  CALCIUM 8.5* 8.8* 8.7* 8.2* 8.3*   Liver Function Tests: Recent Labs  Lab  06/05/17 1817  AST 24  ALT 19  ALKPHOS 70  BILITOT 0.6  PROT 6.7  ALBUMIN 3.9   No results for input(s): LIPASE, AMYLASE in the last 168 hours. No results for input(s): AMMONIA in the last 168 hours. CBC: Recent Labs  Lab 06/05/17 1817  06/07/17 0535  06/08/17 0515 06/08/17 2146 06/09/17 0523 06/10/17 0506 06/11/17 0404  WBC 11.1*   < > 8.4  --  9.4  --  11.3* 13.5* 12.3*  NEUTROABS 9.5*  --   --   --   --   --   --   --   --   HGB 9.3*   < > 6.3*   < > 7.9* 10.3* 10.6* 8.8* 8.0*  HCT 28.9*   < > 19.2*   < > 23.7* 30.4* 31.6* 26.4* 24.3*  MCV 90.3   < > 90.1  --  88.1  --  87.5 89.5 89.3  PLT 245   < > 130*  --  129*  --  148* 171 169   < > = values in this interval not displayed.   Cardiac Enzymes: No results for input(s): CKTOTAL, CKMB, CKMBINDEX, TROPONINI in the last 168 hours. BNP: BNP (last 3 results) Recent Labs    06/05/17 2215  BNP 49.7    ProBNP (last 3 results) No results for input(s): PROBNP in the last 8760 hours.  CBG: Recent Labs  Lab 06/08/17 0738 06/09/17 0734 06/10/17 0723 06/11/17 0837 06/12/17 0730  GLUCAP 100* 124* 134* 114* 105*

## 2017-06-12 NOTE — Clinical Social Work Placement (Signed)
  Pt discharged and [facility] room 7010[facility]A, report [phone].  [facility] transportation arranged. Spoke with pt's daughter via phone- she states she is at pt's home getting her some belongings and will be returning to hospital soon.  All information provided to facility via the HUB. See below for placement details  CLINICAL SOCIAL WORK PLACEMENT  NOTE  Date:  06/12/2017  Patient Details  Name: Nancy Suarez MRN: [id number] Date of Birth: 02/05/1932  Clinical Social Work is seeking post-discharge placement for this patient at the Skilled  Nursing Facility level of care (*CSW will initial, date and re-position this form in  chart as items are completed):  Yes   Patient/family provided with [place] Clinical Social Work Department's list of facilities offering this level of care within the geographic area requested by the patient (or if unable, by the patient's family).  Yes   Patient/family informed of their freedom to choose among providers that offer the needed level of care, that participate in Medicare, Medicaid or managed care program needed by the patient, have an available bed and are willing to accept the patient.  Yes   Patient/family informed of [facility]'s ownership interest in [facility] and [facility], as well as of the fact that they are under no obligation to receive care at these facilities.  PASRR submitted to EDS on       PASRR number received on       Existing PASRR number confirmed on 06/10/17     FL2 transmitted to all facilities in geographic area requested by pt/family on 06/10/17     FL2 transmitted to all facilities within larger geographic area on       Patient informed that his/her managed care company has contracts with or will negotiate with certain facilities, including the following:        Yes   Patient/family informed of bed offers received.  [facility]     Physician recommends and  [facility]    [facility] 06/12/17.  Patient to be transferred to facility by [facility]     Patient family notified on 06/12/17 of transfer.  Name of family member notified:  daughter Byrd HesselbachMaria     PHYSICIAN       Additional Comment:    _______________________________________________ Nelwyn SalisburyMeghan R Nastassia Bazaldua, LCSW 06/12/2017, 2:25 PM

## 2017-06-13 ENCOUNTER — Telehealth: Payer: Self-pay | Admitting: Emergency Medicine

## 2017-06-13 DIAGNOSIS — N183 Chronic kidney disease, stage 3 (moderate): Secondary | ICD-10-CM | POA: Diagnosis not present

## 2017-06-13 DIAGNOSIS — S72402D Unspecified fracture of lower end of left femur, subsequent encounter for closed fracture with routine healing: Secondary | ICD-10-CM | POA: Diagnosis not present

## 2017-06-13 DIAGNOSIS — D509 Iron deficiency anemia, unspecified: Secondary | ICD-10-CM | POA: Diagnosis not present

## 2017-06-13 DIAGNOSIS — I129 Hypertensive chronic kidney disease with stage 1 through stage 4 chronic kidney disease, or unspecified chronic kidney disease: Secondary | ICD-10-CM | POA: Diagnosis not present

## 2017-06-27 DIAGNOSIS — M25552 Pain in left hip: Secondary | ICD-10-CM | POA: Diagnosis not present

## 2017-06-27 DIAGNOSIS — R2689 Other abnormalities of gait and mobility: Secondary | ICD-10-CM | POA: Diagnosis not present

## 2017-06-27 DIAGNOSIS — G8929 Other chronic pain: Secondary | ICD-10-CM | POA: Diagnosis not present

## 2017-07-02 DIAGNOSIS — S72492D Other fracture of lower end of left femur, subsequent encounter for closed fracture with routine healing: Secondary | ICD-10-CM | POA: Diagnosis not present

## 2017-07-17 DIAGNOSIS — K219 Gastro-esophageal reflux disease without esophagitis: Secondary | ICD-10-CM | POA: Diagnosis not present

## 2017-07-17 DIAGNOSIS — I129 Hypertensive chronic kidney disease with stage 1 through stage 4 chronic kidney disease, or unspecified chronic kidney disease: Secondary | ICD-10-CM | POA: Diagnosis not present

## 2017-07-17 DIAGNOSIS — I999 Unspecified disorder of circulatory system: Secondary | ICD-10-CM | POA: Diagnosis not present

## 2017-07-17 DIAGNOSIS — N183 Chronic kidney disease, stage 3 (moderate): Secondary | ICD-10-CM | POA: Diagnosis not present

## 2017-07-20 DIAGNOSIS — Z4789 Encounter for other orthopedic aftercare: Secondary | ICD-10-CM | POA: Diagnosis not present

## 2017-07-23 DIAGNOSIS — I10 Essential (primary) hypertension: Secondary | ICD-10-CM | POA: Diagnosis not present

## 2017-07-25 DIAGNOSIS — G4700 Insomnia, unspecified: Secondary | ICD-10-CM | POA: Diagnosis not present

## 2017-07-25 DIAGNOSIS — F329 Major depressive disorder, single episode, unspecified: Secondary | ICD-10-CM | POA: Diagnosis not present

## 2017-07-25 DIAGNOSIS — F419 Anxiety disorder, unspecified: Secondary | ICD-10-CM | POA: Diagnosis not present

## 2017-08-03 DIAGNOSIS — Z4789 Encounter for other orthopedic aftercare: Secondary | ICD-10-CM | POA: Diagnosis not present

## 2017-08-03 DIAGNOSIS — S72492D Other fracture of lower end of left femur, subsequent encounter for closed fracture with routine healing: Secondary | ICD-10-CM | POA: Diagnosis not present

## 2017-08-08 DIAGNOSIS — M80052D Age-related osteoporosis with current pathological fracture, left femur, subsequent encounter for fracture with routine healing: Secondary | ICD-10-CM | POA: Diagnosis not present

## 2017-08-08 DIAGNOSIS — I129 Hypertensive chronic kidney disease with stage 1 through stage 4 chronic kidney disease, or unspecified chronic kidney disease: Secondary | ICD-10-CM | POA: Diagnosis not present

## 2017-08-08 DIAGNOSIS — M47812 Spondylosis without myelopathy or radiculopathy, cervical region: Secondary | ICD-10-CM | POA: Diagnosis not present

## 2017-08-08 DIAGNOSIS — Z9181 History of falling: Secondary | ICD-10-CM | POA: Diagnosis not present

## 2017-08-08 DIAGNOSIS — F329 Major depressive disorder, single episode, unspecified: Secondary | ICD-10-CM | POA: Diagnosis not present

## 2017-08-08 DIAGNOSIS — N183 Chronic kidney disease, stage 3 (moderate): Secondary | ICD-10-CM | POA: Diagnosis not present

## 2017-09-03 DIAGNOSIS — E7800 Pure hypercholesterolemia, unspecified: Secondary | ICD-10-CM | POA: Diagnosis not present

## 2017-09-03 DIAGNOSIS — Z79891 Long term (current) use of opiate analgesic: Secondary | ICD-10-CM | POA: Diagnosis not present

## 2017-09-03 DIAGNOSIS — Q820 Hereditary lymphedema: Secondary | ICD-10-CM | POA: Diagnosis not present

## 2017-09-03 DIAGNOSIS — D638 Anemia in other chronic diseases classified elsewhere: Secondary | ICD-10-CM | POA: Diagnosis not present

## 2017-09-03 DIAGNOSIS — Z6828 Body mass index (BMI) 28.0-28.9, adult: Secondary | ICD-10-CM | POA: Diagnosis not present

## 2017-09-03 DIAGNOSIS — M2550 Pain in unspecified joint: Secondary | ICD-10-CM | POA: Diagnosis not present

## 2017-09-03 DIAGNOSIS — I119 Hypertensive heart disease without heart failure: Secondary | ICD-10-CM | POA: Diagnosis not present

## 2017-09-07 DIAGNOSIS — Z4789 Encounter for other orthopedic aftercare: Secondary | ICD-10-CM | POA: Diagnosis not present

## 2017-09-07 DIAGNOSIS — S72492D Other fracture of lower end of left femur, subsequent encounter for closed fracture with routine healing: Secondary | ICD-10-CM | POA: Diagnosis not present

## 2017-10-08 DIAGNOSIS — N183 Chronic kidney disease, stage 3 (moderate): Secondary | ICD-10-CM | POA: Diagnosis not present

## 2017-10-08 DIAGNOSIS — M80052D Age-related osteoporosis with current pathological fracture, left femur, subsequent encounter for fracture with routine healing: Secondary | ICD-10-CM | POA: Diagnosis not present

## 2017-10-08 DIAGNOSIS — I129 Hypertensive chronic kidney disease with stage 1 through stage 4 chronic kidney disease, or unspecified chronic kidney disease: Secondary | ICD-10-CM | POA: Diagnosis not present

## 2017-10-08 DIAGNOSIS — Z96652 Presence of left artificial knee joint: Secondary | ICD-10-CM | POA: Diagnosis not present

## 2017-10-08 DIAGNOSIS — M47812 Spondylosis without myelopathy or radiculopathy, cervical region: Secondary | ICD-10-CM | POA: Diagnosis not present

## 2017-10-08 DIAGNOSIS — F329 Major depressive disorder, single episode, unspecified: Secondary | ICD-10-CM | POA: Diagnosis not present

## 2017-10-24 DIAGNOSIS — Z4789 Encounter for other orthopedic aftercare: Secondary | ICD-10-CM | POA: Diagnosis not present

## 2017-10-24 DIAGNOSIS — S72402D Unspecified fracture of lower end of left femur, subsequent encounter for closed fracture with routine healing: Secondary | ICD-10-CM | POA: Diagnosis not present

## 2017-11-06 DIAGNOSIS — E7800 Pure hypercholesterolemia, unspecified: Secondary | ICD-10-CM | POA: Diagnosis not present

## 2017-11-06 DIAGNOSIS — Z79899 Other long term (current) drug therapy: Secondary | ICD-10-CM | POA: Diagnosis not present

## 2017-11-06 DIAGNOSIS — Q820 Hereditary lymphedema: Secondary | ICD-10-CM | POA: Diagnosis not present

## 2017-11-06 DIAGNOSIS — M2550 Pain in unspecified joint: Secondary | ICD-10-CM | POA: Diagnosis not present

## 2017-11-06 DIAGNOSIS — I119 Hypertensive heart disease without heart failure: Secondary | ICD-10-CM | POA: Diagnosis not present

## 2017-11-06 DIAGNOSIS — Z6824 Body mass index (BMI) 24.0-24.9, adult: Secondary | ICD-10-CM | POA: Diagnosis not present

## 2017-12-05 DIAGNOSIS — M79652 Pain in left thigh: Secondary | ICD-10-CM | POA: Diagnosis not present

## 2017-12-05 DIAGNOSIS — S72402D Unspecified fracture of lower end of left femur, subsequent encounter for closed fracture with routine healing: Secondary | ICD-10-CM | POA: Diagnosis not present

## 2017-12-10 ENCOUNTER — Other Ambulatory Visit: Payer: Self-pay

## 2017-12-10 ENCOUNTER — Encounter: Payer: Self-pay | Admitting: Family Medicine

## 2017-12-10 ENCOUNTER — Ambulatory Visit (INDEPENDENT_AMBULATORY_CARE_PROVIDER_SITE_OTHER): Payer: Medicare Other | Admitting: Family Medicine

## 2017-12-10 VITALS — BP 152/70 | HR 65 | Temp 98.0°F | Resp 16 | Ht 64.0 in | Wt 169.0 lb

## 2017-12-10 DIAGNOSIS — H1132 Conjunctival hemorrhage, left eye: Secondary | ICD-10-CM | POA: Diagnosis not present

## 2017-12-10 NOTE — Progress Notes (Signed)
Patient ID: Nancy Suarez, female    DOB: 1932-03-18  Age: 84 y.o. MRN: [id number]  Chief Complaint  Patient presents with  . 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  Some years ago she did have a problem with herpes infection.  This just occurred overnight with no known trauma.  It feels a little irritated but no pain.  Current allergies, medications, problem list, past/family and social histories reviewed.  Objective:  BP (!) 152/70 (BP Location: Left Arm, Patient Position: Sitting, Cuff Size: Normal)   Pulse 65   Temp 98 F (36.7 C) (Oral)   Resp 16   Ht 5\' 4"  (1.626 m)   Wt 169 lb (76.7 kg)   SpO2 98%   BMI 29.01 kg/m  Pleasant alert oriented lady in no acute distress.  Vital signs are stable.  Her early cataracts are visible.  Anterior chamber has no blood in it.  There is bright red blood in the conjunctiva, primarily on the lower 60% of the conjunctiva of the left eye.  She takes aspirin but no other blood thinners.  Chart was reviewed.  She is not diabetic.  She has no other major symptoms.  She does have some swelling of her legs since she had surgery on her femur. So yet to do a lot of work care in the morning she lives with her husband who is a stroke patient.  Assessment & Plan:   Assessment: 1. Subconjunctival hemorrhage of left eye       Plan: See instructions No orders of the defined types were placed in this encounter.   No orders of the defined types were placed in this encounter.        Patient Instructions    Your diabetic he has Subconjunctival Hemorrhage Subconjunctival hemorrhage is bleeding that happens between the white part of your eye (sclera) and the clear membrane that covers the outside of your eye (conjunctiva). There are many tiny blood vessels near the surface of your eye. A subconjunctival  hemorrhage happens when one or more of these vessels breaks and bleeds, causing a red patch to appear on your eye. This is similar to a bruise. Depending on the amount of bleeding, the red patch may only cover a small area of your eye or it may cover the entire visible part of the sclera. If a lot of blood collects under the conjunctiva, there may also be swelling. Subconjunctival hemorrhages do not affect your vision or cause pain, but your eye may feel irritated if there is swelling. Subconjunctival hemorrhages usually do not require treatment, and they disappear on their own within two weeks. What are the causes? This condition may be caused by:  Mild trauma, such as rubbing your eye too hard.  Severe trauma or blunt injuries.  Coughing, sneezing, or vomiting.  Straining, such as when lifting a heavy object.  High blood pressure.  Recent eye surgery.  A history of diabetes.  Certain medicines, especially blood thinners (anticoagulants).  Other conditions, such as eye tumors, bleeding disorders, or blood vessel abnormalities.  Subconjunctival hemorrhages can happen without an obvious cause. What are the signs or symptoms? Symptoms of this condition include:  A bright red or dark red patch on the white part of the eye. ? The red area may spread out to  cover a larger area of the eye before it goes away. ? The red area may turn brownish-yellow before it goes away.  Swelling.  Mild eye irritation.  How is this diagnosed? This condition is diagnosed with a physical exam. If your subconjunctival hemorrhage was caused by trauma, your health care provider may refer you to an eye specialist (ophthalmologist) or another specialist to check for other injuries. You may have other tests, including:  An eye exam.  A blood pressure check.  Blood tests to check for bleeding disorders.  If your subconjunctival hemorrhage was caused by trauma, X-rays or a CT scan may be done to check for  other injuries. How is this treated? Usually, no treatment is needed. Your health care provider may recommend eye drops or cold compresses to help with discomfort. Follow these instructions at home:  Take over-the-counter and prescription medicines only as directed by your health care provider.  Use eye drops or cold compresses to help with discomfort as directed by your health care provider.  Avoid activities, things, and environments that may irritate or injure your eye.  Keep all follow-up visits as told by your health care provider. This is important. Contact a health care provider if:  You have pain in your eye.  The bleeding does not go away within 3 weeks.  You keep getting new subconjunctival hemorrhages. Get help right away if:  Your vision changes or you have difficulty seeing.  You suddenly develop severe sensitivity to light.  You develop a severe headache, persistent vomiting, confusion, or abnormal tiredness (lethargy).  Your eye seems to bulge or protrude from your eye socket.  You develop unexplained bruises on your body.  You have unexplained bleeding in another area of your body. This information is not intended to replace advice given to you by your health care provider. Make sure you discuss any questions you have with your health care provider. Document Released: 06/19/2005 Document Revised: 02/13/2016 Document Reviewed: 08/26/2014 Elsevier Interactive Patient Education  2018 [employer]    IF you received an x-ray today, you will receive an invoice from [facility]. Please contact [facility] at [phone] with questions or concerns regarding your invoice.   IF you received labwork today, you will receive an invoice from [facility]. Please contact [facility] at [phone] with questions or concerns regarding your invoice.   Our billing staff will not be able to assist you with questions regarding bills from these companies.  You  will be contacted with the lab results as soon as they are available. The fastest way to get your results is to activate your My Chart account. Instructions are located on the last page of this paperwork. If you have not heard from us regarding the results in 2 weeks, please contact this office.        Return if symptoms worsen or fail to improve.   Janace Hoardavid Hopper, MD 12/10/2017

## 2017-12-10 NOTE — Patient Instructions (Addendum)
Your diabetic he has Subconjunctival Hemorrhage Subconjunctival hemorrhage is bleeding that happens between the white part of your eye (sclera) and the clear membrane that covers the outside of your eye (conjunctiva). There are many tiny blood vessels near the surface of your eye. A subconjunctival hemorrhage happens when one or more of these vessels breaks and bleeds, causing a red patch to appear on your eye. This is similar to a bruise. Depending on the amount of bleeding, the red patch may only cover a small area of your eye or it may cover the entire visible part of the sclera. If a lot of blood collects under the conjunctiva, there may also be swelling. Subconjunctival hemorrhages do not affect your vision or cause pain, but your eye may feel irritated if there is swelling. Subconjunctival hemorrhages usually do not require treatment, and they disappear on their own within two weeks. What are the causes? This condition may be caused by:  Mild trauma, such as rubbing your eye too hard.  Severe trauma or blunt injuries.  Coughing, sneezing, or vomiting.  Straining, such as when lifting a heavy object.  High blood pressure.  Recent eye surgery.  A history of diabetes.  Certain medicines, especially blood thinners (anticoagulants).  Other conditions, such as eye tumors, bleeding disorders, or blood vessel abnormalities.  Subconjunctival hemorrhages can happen without an obvious cause. What are the signs or symptoms? Symptoms of this condition include:  A bright red or dark red patch on the white part of the eye. ? The red area may spread out to cover a larger area of the eye before it goes away. ? The red area may turn brownish-yellow before it goes away.  Swelling.  Mild eye irritation.  How is this diagnosed? This condition is diagnosed with a physical exam. If your subconjunctival hemorrhage was caused by trauma, your health care provider may refer you to an eye  specialist (ophthalmologist) or another specialist to check for other injuries. You may have other tests, including:  An eye exam.  A blood pressure check.  Blood tests to check for bleeding disorders.  If your subconjunctival hemorrhage was caused by trauma, X-rays or a CT scan may be done to check for other injuries. How is this treated? Usually, no treatment is needed. Your health care provider may recommend eye drops or cold compresses to help with discomfort. Follow these instructions at home:  Take over-the-counter and prescription medicines only as directed by your health care provider.  Use eye drops or cold compresses to help with discomfort as directed by your health care provider.  Avoid activities, things, and environments that may irritate or injure your eye.  Keep all follow-up visits as told by your health care provider. This is important. Contact a health care provider if:  You have pain in your eye.  The bleeding does not go away within 3 weeks.  You keep getting new subconjunctival hemorrhages. Get help right away if:  Your vision changes or you have difficulty seeing.  You suddenly develop severe sensitivity to light.  You develop a severe headache, persistent vomiting, confusion, or abnormal tiredness (lethargy).  Your eye seems to bulge or protrude from your eye socket.  You develop unexplained bruises on your body.  You have unexplained bleeding in another area of your body. This information is not intended to replace advice given to you by your health care provider. Make sure you discuss any questions you have with your health care provider. Document Released: 06/19/2005  Document Revised: 02/13/2016 Document Reviewed: 08/26/2014 Elsevier Interactive Patient Education  [employer]2018 [employer]    IF you received an x-ray today, you will receive an invoice from [facility]. Please contact [facility] at [phone] with questions or  concerns regarding your invoice.   IF you received labwork today, you will receive an invoice from [facility]. Please contact [facility] at [phone] with questions or concerns regarding your invoice.   Our billing staff will not be able to assist you with questions regarding bills from these companies.  You will be contacted with the lab results as soon as they are available. The fastest way to get your results is to activate your My Chart account. Instructions are located on the last page of this paperwork. If you have not heard from us regarding the results in 2 weeks, please contact this office.

## 2018-01-29 DIAGNOSIS — M2550 Pain in unspecified joint: Secondary | ICD-10-CM | POA: Diagnosis not present

## 2018-01-29 DIAGNOSIS — I119 Hypertensive heart disease without heart failure: Secondary | ICD-10-CM | POA: Diagnosis not present

## 2018-01-29 DIAGNOSIS — Z6828 Body mass index (BMI) 28.0-28.9, adult: Secondary | ICD-10-CM | POA: Diagnosis not present

## 2018-01-29 DIAGNOSIS — Q820 Hereditary lymphedema: Secondary | ICD-10-CM | POA: Diagnosis not present

## 2018-04-08 DIAGNOSIS — Q820 Hereditary lymphedema: Secondary | ICD-10-CM | POA: Diagnosis not present

## 2018-04-08 DIAGNOSIS — Z6828 Body mass index (BMI) 28.0-28.9, adult: Secondary | ICD-10-CM | POA: Diagnosis not present

## 2018-04-08 DIAGNOSIS — M2550 Pain in unspecified joint: Secondary | ICD-10-CM | POA: Diagnosis not present

## 2018-04-08 DIAGNOSIS — H6091 Unspecified otitis externa, right ear: Secondary | ICD-10-CM | POA: Diagnosis not present

## 2018-04-11 DIAGNOSIS — H6091 Unspecified otitis externa, right ear: Secondary | ICD-10-CM | POA: Diagnosis not present

## 2018-04-11 DIAGNOSIS — Q820 Hereditary lymphedema: Secondary | ICD-10-CM | POA: Diagnosis not present

## 2018-04-11 DIAGNOSIS — Z6828 Body mass index (BMI) 28.0-28.9, adult: Secondary | ICD-10-CM | POA: Diagnosis not present

## 2018-04-11 DIAGNOSIS — M2550 Pain in unspecified joint: Secondary | ICD-10-CM | POA: Diagnosis not present

## 2018-06-28 ENCOUNTER — Other Ambulatory Visit: Payer: Self-pay | Admitting: Pharmacist

## 2018-06-28 NOTE — Patient Outreach (Signed)
[facility] Care Management  06/28/2018  Berta MinorRoxana R Rawlinson 02/06/1932 [id number]   Incoming call from Berta Minoroxana R Canizales in response to the [facility] Medication Adherence Campaign. Speak with patient. HIPAA identifiers verified and verbal consent received.  Ms. Nancy Suarez reports that she is taking her atorvastatin once daily as directed. Denies any barriers to adherence. Reports that her daughter now manages her medications and prepares her weekly pillbox for her.  Patient expresses confusion about recent changes to her losartan prescription. Patient states that this prescription was recently changed by her pharmacy.  Call patient's [facility] pharmacy and speak with the pharmacist, who reports that the pharmacy was unable to stock the patient's previous losartan-hydrochlorothiazide combination medication, so the prescription was split into the components, losartan 100 mg once daily and hydrochlorothiazide 25 mg once daily. Pharmacist confirms that patient picked up the losartan 100 mg on 06/12/18 and the hydrochlorothiazide 25 mg on 06/22/18. Pharmacist reports that patient also had her atorvastatin prescription refilled on 06/12/18 for a 90 day supply.  Return call to Ms. Nancy Suarez and explain to her the change from the losartan-hydrochlorothiazide combination to the two components. Patient verbalizes understanding and states that both of these new prescriptions are filled into her weekly pillbox.   Patient denies any further medication questions/concerns at this time. Provide patient with my phone number.  Will close pharmacy episode at this time.  Duanne MoronElisabeth Selina Tapper, PharmD, [facility] Clinical Pharmacist [facility] Care Management [phone]

## 2018-07-09 DIAGNOSIS — H6000 Abscess of external ear, unspecified ear: Secondary | ICD-10-CM | POA: Diagnosis not present

## 2018-07-09 DIAGNOSIS — Q820 Hereditary lymphedema: Secondary | ICD-10-CM | POA: Diagnosis not present

## 2018-07-09 DIAGNOSIS — M2550 Pain in unspecified joint: Secondary | ICD-10-CM | POA: Diagnosis not present

## 2018-07-09 DIAGNOSIS — Z6828 Body mass index (BMI) 28.0-28.9, adult: Secondary | ICD-10-CM | POA: Diagnosis not present

## 2018-10-08 DIAGNOSIS — K5900 Constipation, unspecified: Secondary | ICD-10-CM | POA: Diagnosis not present

## 2018-10-08 DIAGNOSIS — M2550 Pain in unspecified joint: Secondary | ICD-10-CM | POA: Diagnosis not present

## 2018-10-08 DIAGNOSIS — Q820 Hereditary lymphedema: Secondary | ICD-10-CM | POA: Diagnosis not present

## 2018-10-08 DIAGNOSIS — E7800 Pure hypercholesterolemia, unspecified: Secondary | ICD-10-CM | POA: Diagnosis not present

## 2019-01-07 DIAGNOSIS — Z9181 History of falling: Secondary | ICD-10-CM | POA: Diagnosis not present

## 2019-01-07 DIAGNOSIS — Z79891 Long term (current) use of opiate analgesic: Secondary | ICD-10-CM | POA: Diagnosis not present

## 2019-01-07 DIAGNOSIS — M2550 Pain in unspecified joint: Secondary | ICD-10-CM | POA: Diagnosis not present

## 2019-01-07 DIAGNOSIS — M159 Polyosteoarthritis, unspecified: Secondary | ICD-10-CM | POA: Diagnosis not present

## 2019-04-08 DIAGNOSIS — Z9181 History of falling: Secondary | ICD-10-CM | POA: Diagnosis not present

## 2019-04-08 DIAGNOSIS — M1990 Unspecified osteoarthritis, unspecified site: Secondary | ICD-10-CM | POA: Diagnosis not present

## 2019-04-08 DIAGNOSIS — M2550 Pain in unspecified joint: Secondary | ICD-10-CM | POA: Diagnosis not present

## 2019-04-08 DIAGNOSIS — Z79891 Long term (current) use of opiate analgesic: Secondary | ICD-10-CM | POA: Diagnosis not present

## 2019-07-08 DIAGNOSIS — M2550 Pain in unspecified joint: Secondary | ICD-10-CM | POA: Diagnosis not present

## 2019-07-08 DIAGNOSIS — M159 Polyosteoarthritis, unspecified: Secondary | ICD-10-CM | POA: Diagnosis not present

## 2019-07-08 DIAGNOSIS — Z79891 Long term (current) use of opiate analgesic: Secondary | ICD-10-CM | POA: Diagnosis not present

## 2019-07-08 DIAGNOSIS — Z9181 History of falling: Secondary | ICD-10-CM | POA: Diagnosis not present

## 2019-10-02 DIAGNOSIS — E7800 Pure hypercholesterolemia, unspecified: Secondary | ICD-10-CM | POA: Diagnosis not present

## 2019-10-02 DIAGNOSIS — I890 Lymphedema, not elsewhere classified: Secondary | ICD-10-CM | POA: Diagnosis not present

## 2019-10-02 DIAGNOSIS — M2550 Pain in unspecified joint: Secondary | ICD-10-CM | POA: Diagnosis not present

## 2019-10-02 DIAGNOSIS — K5909 Other constipation: Secondary | ICD-10-CM | POA: Diagnosis not present

## 2019-10-02 DIAGNOSIS — Z79899 Other long term (current) drug therapy: Secondary | ICD-10-CM | POA: Diagnosis not present

## 2019-10-02 DIAGNOSIS — M899 Disorder of bone, unspecified: Secondary | ICD-10-CM | POA: Diagnosis not present

## 2019-10-02 DIAGNOSIS — Z6828 Body mass index (BMI) 28.0-28.9, adult: Secondary | ICD-10-CM | POA: Diagnosis not present

## 2019-10-21 DIAGNOSIS — Z01419 Encounter for gynecological examination (general) (routine) without abnormal findings: Secondary | ICD-10-CM | POA: Diagnosis not present

## 2019-10-21 DIAGNOSIS — Z1239 Encounter for other screening for malignant neoplasm of breast: Secondary | ICD-10-CM | POA: Diagnosis not present

## 2019-10-21 DIAGNOSIS — Z1211 Encounter for screening for malignant neoplasm of colon: Secondary | ICD-10-CM | POA: Diagnosis not present

## 2019-10-21 DIAGNOSIS — M8580 Other specified disorders of bone density and structure, unspecified site: Secondary | ICD-10-CM | POA: Diagnosis not present

## 2019-10-30 DIAGNOSIS — H402214 Chronic angle-closure glaucoma, right eye, indeterminate stage: Secondary | ICD-10-CM | POA: Diagnosis not present

## 2019-10-30 DIAGNOSIS — H25813 Combined forms of age-related cataract, bilateral: Secondary | ICD-10-CM | POA: Diagnosis not present

## 2019-10-30 DIAGNOSIS — H402224 Chronic angle-closure glaucoma, left eye, indeterminate stage: Secondary | ICD-10-CM | POA: Diagnosis not present

## 2019-11-18 DIAGNOSIS — H25813 Combined forms of age-related cataract, bilateral: Secondary | ICD-10-CM | POA: Diagnosis not present

## 2019-11-18 DIAGNOSIS — H402214 Chronic angle-closure glaucoma, right eye, indeterminate stage: Secondary | ICD-10-CM | POA: Diagnosis not present

## 2019-11-18 DIAGNOSIS — H402224 Chronic angle-closure glaucoma, left eye, indeterminate stage: Secondary | ICD-10-CM | POA: Diagnosis not present

## 2019-12-11 DIAGNOSIS — H402234 Chronic angle-closure glaucoma, bilateral, indeterminate stage: Secondary | ICD-10-CM | POA: Diagnosis not present

## 2019-12-11 DIAGNOSIS — H25813 Combined forms of age-related cataract, bilateral: Secondary | ICD-10-CM | POA: Diagnosis not present

## 2020-01-06 DIAGNOSIS — H402234 Chronic angle-closure glaucoma, bilateral, indeterminate stage: Secondary | ICD-10-CM | POA: Diagnosis not present

## 2020-01-06 DIAGNOSIS — H25813 Combined forms of age-related cataract, bilateral: Secondary | ICD-10-CM | POA: Diagnosis not present

## 2020-01-12 DIAGNOSIS — Z0001 Encounter for general adult medical examination with abnormal findings: Secondary | ICD-10-CM | POA: Diagnosis not present

## 2020-01-12 DIAGNOSIS — M2550 Pain in unspecified joint: Secondary | ICD-10-CM | POA: Diagnosis not present

## 2020-01-12 DIAGNOSIS — Q820 Hereditary lymphedema: Secondary | ICD-10-CM | POA: Diagnosis not present

## 2020-01-12 DIAGNOSIS — K5900 Constipation, unspecified: Secondary | ICD-10-CM | POA: Diagnosis not present

## 2020-01-27 DIAGNOSIS — H402234 Chronic angle-closure glaucoma, bilateral, indeterminate stage: Secondary | ICD-10-CM | POA: Diagnosis not present

## 2020-01-27 DIAGNOSIS — H25813 Combined forms of age-related cataract, bilateral: Secondary | ICD-10-CM | POA: Diagnosis not present

## 2020-02-12 DIAGNOSIS — H25813 Combined forms of age-related cataract, bilateral: Secondary | ICD-10-CM | POA: Diagnosis not present

## 2020-02-12 DIAGNOSIS — H402234 Chronic angle-closure glaucoma, bilateral, indeterminate stage: Secondary | ICD-10-CM | POA: Diagnosis not present

## 2020-04-13 DIAGNOSIS — Q820 Hereditary lymphedema: Secondary | ICD-10-CM | POA: Diagnosis not present

## 2020-04-13 DIAGNOSIS — M2550 Pain in unspecified joint: Secondary | ICD-10-CM | POA: Diagnosis not present

## 2020-04-13 DIAGNOSIS — R8281 Pyuria: Secondary | ICD-10-CM | POA: Diagnosis not present

## 2020-04-13 DIAGNOSIS — Z6828 Body mass index (BMI) 28.0-28.9, adult: Secondary | ICD-10-CM | POA: Diagnosis not present

## 2020-04-26 DIAGNOSIS — R8281 Pyuria: Secondary | ICD-10-CM | POA: Diagnosis not present

## 2020-05-10 DIAGNOSIS — H5709 Other anomalies of pupillary function: Secondary | ICD-10-CM | POA: Diagnosis not present

## 2020-05-10 DIAGNOSIS — H268 Other specified cataract: Secondary | ICD-10-CM | POA: Diagnosis not present

## 2020-05-10 DIAGNOSIS — H25813 Combined forms of age-related cataract, bilateral: Secondary | ICD-10-CM | POA: Diagnosis not present

## 2020-05-10 DIAGNOSIS — H25811 Combined forms of age-related cataract, right eye: Secondary | ICD-10-CM | POA: Diagnosis not present

## 2020-06-15 DIAGNOSIS — E7800 Pure hypercholesterolemia, unspecified: Secondary | ICD-10-CM | POA: Diagnosis not present

## 2020-06-15 DIAGNOSIS — Z79899 Other long term (current) drug therapy: Secondary | ICD-10-CM | POA: Diagnosis not present

## 2020-06-15 DIAGNOSIS — I119 Hypertensive heart disease without heart failure: Secondary | ICD-10-CM | POA: Diagnosis not present

## 2020-06-15 DIAGNOSIS — Q820 Hereditary lymphedema: Secondary | ICD-10-CM | POA: Diagnosis not present

## 2020-06-15 DIAGNOSIS — M2550 Pain in unspecified joint: Secondary | ICD-10-CM | POA: Diagnosis not present

## 2020-06-15 DIAGNOSIS — Z6824 Body mass index (BMI) 24.0-24.9, adult: Secondary | ICD-10-CM | POA: Diagnosis not present

## 2020-06-15 DIAGNOSIS — G4700 Insomnia, unspecified: Secondary | ICD-10-CM | POA: Diagnosis not present

## 2020-07-15 DIAGNOSIS — M2550 Pain in unspecified joint: Secondary | ICD-10-CM | POA: Diagnosis not present

## 2020-07-15 DIAGNOSIS — Z6826 Body mass index (BMI) 26.0-26.9, adult: Secondary | ICD-10-CM | POA: Diagnosis not present

## 2020-07-15 DIAGNOSIS — G4700 Insomnia, unspecified: Secondary | ICD-10-CM | POA: Diagnosis not present

## 2020-07-15 DIAGNOSIS — Q820 Hereditary lymphedema: Secondary | ICD-10-CM | POA: Diagnosis not present

## 2020-09-06 ENCOUNTER — Ambulatory Visit: Payer: Medicare Other | Admitting: Neurology

## 2020-09-06 ENCOUNTER — Other Ambulatory Visit: Payer: Self-pay

## 2020-09-06 ENCOUNTER — Encounter: Payer: Self-pay | Admitting: Neurology

## 2020-09-06 VITALS — BP 134/72 | HR 69 | Ht 64.0 in | Wt 160.0 lb

## 2020-09-06 DIAGNOSIS — R413 Other amnesia: Secondary | ICD-10-CM | POA: Diagnosis not present

## 2020-09-06 DIAGNOSIS — Z79899 Other long term (current) drug therapy: Secondary | ICD-10-CM

## 2020-09-06 DIAGNOSIS — F5104 Psychophysiologic insomnia: Secondary | ICD-10-CM

## 2020-09-06 DIAGNOSIS — Z820 Family history of epilepsy and other diseases of the nervous system: Secondary | ICD-10-CM

## 2020-09-06 DIAGNOSIS — N183 Chronic kidney disease, stage 3 unspecified: Secondary | ICD-10-CM | POA: Diagnosis not present

## 2020-09-06 DIAGNOSIS — Z8669 Personal history of other diseases of the nervous system and sense organs: Secondary | ICD-10-CM

## 2020-09-06 NOTE — Patient Instructions (Signed)
You have complaints of memory loss: memory loss or changes in cognitive function can have many reasons and does not always mean you have dementia. Conditions that can contribute to subjective or objective memory loss include: depression, stress, poor sleep from insomnia or sleep apnea, dehydration, fluctuation in blood sugar values, thyroid or electrolyte dysfunction and certain vitamin deficiencies. Dementia can be caused by stroke, brain atherosclerosis or brain vascular disease due to vascular risk factors (smoking, high blood pressure, high cholesterol, obesity and uncontrolled diabetes), certain degenerative brain disorders (including Parkinson's disease and Multiple sclerosis) and by Alzheimer's disease or other, more rare and sometimes hereditary causes.   We will do some additional testing: blood work (some of which has been done recently already through your primary care) and we will do a brain scan. We will not start medication as yet. We may consider a sleep study as you have difficulty with sleep and may have been diagnosed with sleep apnea before.   We will call you with brain scan test results and monitor your symptoms.  Your memory loss may also be, in part, related to your chronic medical issues including anemia, chronic kidney disease, and also taking several medications including potentially sedating medications.  Please continue to talk to your primary care about reducing your Ambien and potentially also reducing your narcotic pain medication and gabapentin if possible.

## 2020-09-06 NOTE — Progress Notes (Signed)
Subjective:    Patient ID: Nancy Suarez is a 87 y.o. female.  HPI     Huston FoleySaima Crixus Mcaulay, MD, PhD [facility] [address], [address] [address] [place], [place] [id number]  Dear Dr.  Petra KubaKilpatrick,  I saw your patient, Nancy DellenRoxanna Suarez, your kind request in my neurologic clinic today for initial consultation on her memory loss.  The patient is accompanied by her daughter today.  As you know, Ms. Nancy Suarez is an 87 year old right-handed woman with an underlying medical history of reflux disease, goiter, hypertension, hyperlipidemia, osteopenia, depression, pancreatitis, spinal arthritis, scoliosis, anemia, chronic kidney disease, anxiety, joint pain and insomnia, who reports having difficulty with her memory.  She is unable to provide details of her history and most of her history is provided by her daughter.  Patient has had forgetfulness and mood irritability for the past 6+ months.  She moved in with her daughter who is her only child in late 2020 after her husband passed away.  In the past 6+ months she has had difficulty with her sleep, she tends to be up longer at night, goes to bed around 2 AM or later.  She has had confusion at night and also disorientation, she has had mood irritability and some anger outbursts.  She has not had any obvious hallucinations.  She takes Ambien at night for sleep, this was reduced to 5 mg recently.  She has been on Ambien for years.  She is also on Remeron.  I reviewed your office note from 06/15/2000.  She complained of short-term memory problem and forgetfulness.  Of note, she is on multiple medications including narcotic pain medication in the form of generic Percocet 1 pill every 4 hours as needed, she is also on gabapentin 300 mg 4 times a day, she is on Remeron 15 mg at bedtime and has also been on Ambien, 10 mg strength.  She had blood work through your office on 06/15/2020 and I reviewed the results: Total cholesterol 157, HDL 75, triglycerides  66, LDL 67, glucose 78, creatinine elevated at 1.54, BUN 19, AST 13, ALT 8, uric acid 5.9, TSH 0.89, CBC with differential showed WBC of 5.2, red blood cell count of 3.35, below normal, hemoglobin low at 9.7, hematocrit low at 29.7. He had a head CT and cervical spine CT without contrast after a fall on 06/06/2017 and I reviewed the results:  IMPRESSION: Head CT: No acute or traumatic finding. Mild age related atrophy and small-vessel ischemic change.   Cervical spine CT: No acute or traumatic finding. Extensive degenerative changes throughout the region as outlined above.  She has not had any falls in the past 6 months.  She has a reasonably good appetite and hydrates with water.  She had a sleep study several years ago and indicates that she was diagnosed with sleep apnea, she was advised to use a CPAP machine at the time but did not pursue it.  Daughter reports no significant snoring currently.  Patient has a family history of memory loss affecting her brother.  He died at the age of 85, he was 4 years older and had dementia.  Another brother died in his 4460s.  Neither parent had memory loss or dementia, father lived to be 2085 and mother lived to be 9772.  The patient has 1 daughter, she lives with her daughter, son-in-law, granddaughter and step grandson.    She quit smoking in 1980, she does not currently drink any alcohol and does not  have a history of heavy alcohol use.  She drinks caffeine in the form of soda, 1 serving per day on average.  She has chronic back pain and arthritis issues.  She has had swelling in both ankles which has become worse in the recent past.  Her Past Medical History Is Significant For: Past Medical History:  Diagnosis Date  . ASCUS (atypical squamous cells of undetermined significance) on Pap smear   . Atrophic vaginitis   . Chronic insomnia   . Depression   . GERD (gastroesophageal reflux disease)   . Goiter   . History of kidney stones   . Hypercholesteremia    . Hypertension   . LGSIL (low grade squamous intraepithelial dysplasia)   . Osteopenia   . Ovarian cyst    "shrank it"  . Pancreatitis   . Scoliosis   . Spinal arthritis     Her Past Surgical History Is Significant For: Past Surgical History:  Procedure Laterality Date  . ABDOMINAL HYSTERECTOMY  1977   partial  . KIDNEY STONE SURGERY  1990's   2-3 stones x1 surgery  . ORIF FEMUR FRACTURE Left 06/09/2017   Procedure: OPEN REDUCTION INTERNAL FIXATION (ORIF) DISTAL FEMUR FRACTURE;  Surgeon: Durene Romans, MD;  Location: [facility];  Service: Orthopedics;  Laterality: Left;  . TONSILLECTOMY  as teenager  . TOTAL KNEE ARTHROPLASTY Left 01/19/2014   Procedure: LEFT TOTAL KNEE ARTHROPLASTY;  Surgeon: Shelda Pal, MD;  Location: [facility];  Service: Orthopedics;  Laterality: Left;  . WISDOM TOOTH EXTRACTION      Her Family History Is Significant For: Family History  Problem Relation Age of Onset  . Hypertension Father   . Cancer Father   . Cancer Mother        Bone  . Cancer Brother     Her Social History Is Significant For: Social History   Socioeconomic History  . Marital status: Married    Spouse name: Not on file  . Number of children: Not on file  . Years of education: Not on file  . Highest education level: Not on file  Occupational History  . Not on file  Tobacco Use  . Smoking status: Former Smoker    Years: 20.00    Types: Cigarettes    Quit date: 07/03/1978    Years since quitting: 42.2  . Smokeless tobacco: Never Used  Substance and Sexual Activity  . Alcohol use: Not Currently    Comment: social  . Drug use: No  . Sexual activity: Yes    Birth control/protection: Surgical    Comment: hysterectomy  Other Topics Concern  . Not on file  Social History Narrative  . Not on file   Social Determinants of Health   Financial Resource Strain: Not on file  Food Insecurity: Not on file  Transportation Needs: Not on file  Physical Activity: Not on file  Stress:  Not on file  Social Connections: Not on file    Her Allergies Are:  Allergies  Allergen Reactions  . Aspirin     Upsets stomach if 325mg   . Codeine Nausea And Vomiting  . Sulfa Antibiotics Nausea Only  :   Her Current Medications Are:  Outpatient Encounter Medications as of 09/06/2020  Medication Sig  . acetaminophen (TYLENOL) 325 MG tablet Take 2 tablets (650 mg total) by mouth every 4 (four) hours as needed for mild pain ((score 1 to 3) or temp > 100.5).  11/06/2020 atorvastatin (LIPITOR) 20 MG tablet Take 20 mg  by mouth every evening.   . calcium-vitamin D (OSCAL WITH D) 500-200 MG-UNIT per tablet Take 1 tablet by mouth 2 (two) times daily.  . carboxymethylcellulose (REFRESH PLUS) 0.5 % SOLN Place 1 drop into both eyes as needed (Dry eyes).  . cetirizine (ZYRTEC) 10 MG tablet Take 10 mg by mouth as needed for allergies.  Marland Kitchen docusate sodium 100 MG CAPS Take 100 mg by mouth 2 (two) times daily.  Marland Kitchen escitalopram (LEXAPRO) 10 MG tablet Take 10 mg by mouth daily.  . famotidine (PEPCID) 20 MG tablet famotidine 20 mg tablet  TAKE 1 TABLET BY MOUTH TWICE DAILY  . gabapentin (NEURONTIN) 300 MG capsule Take 300 mg by mouth every 6 (six) hours.  Marland Kitchen HYDROcodone-acetaminophen (NORCO/VICODIN) 5-325 MG tablet Take 1-2 tablets by mouth every 6 (six) hours as needed for moderate pain ((score 4 to 6)).  Marland Kitchen losartan-hydrochlorothiazide (HYZAAR) 100-25 MG tablet losartan 100 mg-hydrochlorothiazide 25 mg tablet  TAKE 1 TABLET BY MOUTH EVERY DAY  . mirtazapine (REMERON) 15 MG tablet Take 15 mg by mouth at bedtime.  . Multiple Vitamin (MULTIVITAMIN WITH MINERALS) TABS tablet Take 1 tablet by mouth daily.  Marland Kitchen omeprazole (PRILOSEC) 20 MG capsule Take 20 mg by mouth daily.  Marland Kitchen zolpidem (AMBIEN) 5 MG tablet Take 2.5 mg by mouth at bedtime as needed for sleep.  . [DISCONTINUED] amLODipine (NORVASC) 5 MG tablet Take 1 tablet (5 mg total) by mouth daily.  . [DISCONTINUED] aspirin 81 MG chewable tablet Chew 1 tablet (81 mg  total) by mouth every other day.  . [DISCONTINUED] colesevelam [facility]) 625 MG tablet Take 625 mg by mouth every Monday, Wednesday, and Friday.  . [DISCONTINUED] ferrous sulfate 325 (65 FE) MG tablet Take 1 tablet (325 mg total) by mouth 3 (three) times daily after meals.  . [DISCONTINUED] hydrochlorothiazide (HYDRODIURIL) 25 MG tablet Take 25 mg by mouth daily.  . [DISCONTINUED] losartan (COZAAR) 100 MG tablet Take 100 mg by mouth daily.  . [DISCONTINUED] methocarbamol (ROBAXIN) 500 MG tablet Take 1 tablet (500 mg total) by mouth every 6 (six) hours as needed for muscle spasms. (Patient not taking: Reported on 09/06/2020)  . [DISCONTINUED] ondansetron (ZOFRAN) 4 MG tablet Take 1 tablet (4 mg total) by mouth every 6 (six) hours as needed for nausea. (Patient not taking: Reported on 09/06/2020)  . [DISCONTINUED] polyethylene glycol (MIRALAX / GLYCOLAX) packet Take 17 g by mouth 2 (two) times daily. (Patient not taking: Reported on 09/06/2020)  . [DISCONTINUED] simethicone (MYLICON) 80 MG chewable tablet Chew 1 tablet (80 mg total) by mouth 4 (four) times daily as needed for flatulence. (Patient not taking: Reported on 09/06/2020)  . [DISCONTINUED] tiZANidine (ZANAFLEX) 4 MG tablet Take 1 tablet (4 mg total) by mouth every 6 (six) hours as needed. (Patient not taking: Reported on 09/06/2020)  . [DISCONTINUED] zolpidem (AMBIEN) 10 MG tablet Take 1 tablet (10 mg total) by mouth at bedtime. (Patient not taking: Reported on 09/06/2020)   No facility-administered encounter medications on file as of 09/06/2020.  :   Review of Systems:  Out of a complete 14 point review of systems, all are reviewed and negative with the exception of these symptoms as listed below:  Review of Systems  Neurological:       Here for consult on memory. Daughter states pt's memory has declined over the last several months. She states between 4- 7 pm is a hard time for the pt. Pt lives with daughter primarily.     Objective:   Neurological  Exam  Physical Exam Physical Examination:   Vitals:   09/06/20 1440  BP: 134/72  Pulse: 69  SpO2: 100%    General Examination: The patient is a very pleasant 87 y.o. female in no acute distress. She appears well-developed and well-nourished and well groomed.  She is situated in her wheelchair.  HEENT: Normocephalic, atraumatic, pupils are equal, round and reactive to light, corrective eyeglasses in place, tracking is fairly well preserved, face is symmetric with normal facial animation, speech is clear without dysarthria, hearing is grossly intact, neck is supple, no carotid bruits, full range of motion.  Airway examination reveals mild to moderate mouth dryness, adequate dental hygiene.  Tongue protrudes centrally and palate elevates symmetrically.   Chest: Clear to auscultation without wheezing, rhonchi or crackles noted.  Heart: S1+S2+0, regular and normal without murmurs, rubs or gallops noted.   Abdomen: Soft, non-tender and non-distended with normal bowel sounds appreciated on auscultation.  Extremities: There is 2+ pitting edema in the distal lower extremities bilaterally, mostly around the ankles, pedal pulses are difficult to palpate.  Skin: Warm and dry without trophic changes noted.   Musculoskeletal: exam reveals no obvious joint deformities.   Neurologically:  Mental status: The patient is awake, alert and pays good attention.  She is able to provide some parts of her history.  Details immediaare provided by her daughter.   On 09/06/2020: MMSE: 20/30, CDT: 4/4, AFT: 8/min.   Thought process is linear. Mood is normal and affect is normal.  Cranial nerves II - XII are as described above under HEENT exam. In addition: shoulder shrug is normal with equal shoulder height noted. Motor exam: Normal bulk, strength and tone is noted. There is no drift, tremor or rebound. Romberg is not tested due to safety concerns.  Gait and balance are not tested because she did  not bring her walker.  She uses a walker at home and is in a wheelchair currently.  Reflexes are 1+ in the upper extremities and absent in the lower extremities. Fine motor skills and coordination: Grossly intact in the upper and lower extremities.   Cerebellar testing: No dysmetria or intention tremor.  Sensory exam: intact to light touch in the upper and lower extremities.   Assessment and Plan:  In summary, Nancy Suarez is a very pleasant 87 y.o.-year old female with an underlying medical history of reflux disease, goiter, hypertension, hyperlipidemia, osteopenia, depression, pancreatitis, spinal arthritis, scoliosis, anemia, chronic kidney disease, anxiety, joint pain and insomnia, who presents for evaluation of her memory loss of at least 6 months duration.  She has a history of memory loss affecting 1 older brother but no telltale history of Alzheimer's dementia affecting any other close family member.  She does have vascular risk factors.  She may have a prior diagnosis of obstructive sleep apnea.  She has had sleep difficulty which are chronic.  Of note, she is on multiple potentially sedating medications and medical chronic issues such as chronic anemia and chronic kidney disease may also factor in with her cognitive difficulties.  She had some recent blood work through your office, we will add some additional blood work today to look at autoimmune and inflammatory markers, vitamin B12, B6 and B1 deficiencies and we will call them with the results.  In addition, I would like to proceed with a brain MRI without contrast.  Given her chronic kidney impairment we will proceed with the brain MRI without contrast.  We will also call them with the results  and plan a follow-up after testing.  Her memory scores were in the mild to moderately abnormal range.  We may consider medication for memory loss such as generic Aricept or Namenda.  We will pick up our discussion at the next visit and keep him posted as  to her blood test results and scan results by phone call in the interim.  We talked about the importance of healthy lifestyle including good nutrition, good hydration with water, physical activity, getting enough sleep.  She is advised to talk to you about medication management and the possibility of further tapering her Ambien and even the possibility of reducing her gabapentin and/or narcotic pain medication if possible. I answered all their questions today and the patient and her daughter were in agreement.  Thank you very much for allowing me to participate in the care of this nice patient. If I can be of any further assistance to you please do not hesitate to call me at [phone].  Sincerely,   Huston Foley, MD, PhD

## 2020-09-07 ENCOUNTER — Telehealth: Payer: Self-pay | Admitting: Neurology

## 2020-09-07 LAB — VITAMIN D 25 HYDROXY (VIT D DEFICIENCY, FRACTURES): Vit D, 25-Hydroxy: 37.8 ng/mL (ref 30.0–100.0)

## 2020-09-07 NOTE — Telephone Encounter (Signed)
[facility] medicare order sent to [facility]. No auth they will reach out to the patient to schedule.  

## 2020-09-09 NOTE — Progress Notes (Signed)
Please call patient or her family member on DPR regarding her blood work.  Findings were benign with the exception of a marker called rheumatoid factor which was positive.  This can be seen in patients with arthritis including rheumatoid arthritis.  She does have a history of arthritis.  I would recommend follow-up with primary care physician regarding arthritis management, and specifically, his input regarding seeing a specialist such as a rheumatologist if the need arises.  One blood test is pending which is the vitamin B1 level, it usually takes a few more days.  We will update if abnormal.

## 2020-09-13 ENCOUNTER — Telehealth: Payer: Self-pay

## 2020-09-13 NOTE — Telephone Encounter (Signed)
-----   Message from Huston Foley, MD sent at 09/09/2020  4:43 PM EST ----- Please call patient or her family member on DPR regarding her blood work.  Findings were benign with the exception of a marker called rheumatoid factor which was positive.  This can be seen in patients with arthritis including rheumatoid arthritis.  She does have a history of arthritis.  I would recommend follow-up with primary care physician regarding arthritis management, and specifically, his input regarding seeing a specialist such as a rheumatologist if the need arises.  One blood test is pending which is the vitamin B1 level, it usually takes a few more days.  We will update if abnormal.

## 2020-09-13 NOTE — Telephone Encounter (Signed)
I called the pt's daughter and advised of results. She verbalized understanding and sts pt is being follow by Dr. Petra Kuba for her arthritis d/x.  I have sent result over to Dr. Petra Kuba.

## 2020-09-17 LAB — RHEUMATOID FACTOR: Rheumatoid fact SerPl-aCnc: 16.5 IU/mL — ABNORMAL HIGH (ref <14.0)

## 2020-09-17 LAB — HGB A1C W/O EAG: Hgb A1c MFr Bld: 4.9 % (ref 4.8–5.6)

## 2020-09-17 LAB — C-REACTIVE PROTEIN: CRP: <1 mg/L (ref 0–10)

## 2020-09-17 LAB — VITAMIN B1: Thiamine: 79.8 nmol/L (ref 66.5–200.0)

## 2020-09-17 LAB — VITAMIN B6: Vitamin B6: 2.3 ug/L (ref 2.0–32.8)

## 2020-09-17 LAB — B12 AND FOLATE PANEL
Folate: 14.6 ng/mL (ref >3.0)
Vitamin B-12: 803 pg/mL (ref 232–1245)

## 2020-09-23 ENCOUNTER — Ambulatory Visit
Admission: RE | Admit: 2020-09-23 | Discharge: 2020-09-23 | Disposition: A | Discharge status: Home / Self Care | Payer: Medicare Other | Source: Ambulatory Visit | Attending: Neurology | Admitting: Neurology

## 2020-09-23 DIAGNOSIS — F5104 Psychophysiologic insomnia: Secondary | ICD-10-CM

## 2020-09-23 DIAGNOSIS — Z79899 Other long term (current) drug therapy: Secondary | ICD-10-CM

## 2020-09-23 DIAGNOSIS — N183 Chronic kidney disease, stage 3 unspecified: Secondary | ICD-10-CM

## 2020-09-23 DIAGNOSIS — R413 Other amnesia: Secondary | ICD-10-CM | POA: Diagnosis not present

## 2020-09-23 DIAGNOSIS — Z8669 Personal history of other diseases of the nervous system and sense organs: Secondary | ICD-10-CM

## 2020-09-23 DIAGNOSIS — Z820 Family history of epilepsy and other diseases of the nervous system: Secondary | ICD-10-CM

## 2020-09-27 NOTE — Progress Notes (Signed)
Please call patient or daughter (on [place]) and advise them that her brain MRI without contrast did not show any acute findings, and only chronic, age-appropriate findings were seen.  Overall reassuring.  She can follow-up in this clinic as scheduled.

## 2020-09-28 ENCOUNTER — Telehealth: Payer: Self-pay

## 2020-09-28 NOTE — Telephone Encounter (Signed)
Thank you for talking with patient's daughter, nothing further needed.  Unfortunately, there is likely not a simple solution here.  Gabapentin may have helped her with sleep and also is used as a mood stabilizer and coming off of it may have caused some mood related issues to worsen.  Ambien discontinuation may have resulted in less sleep.

## 2020-09-28 NOTE — Telephone Encounter (Signed)
I called patient's daughter(maria ok per dpr) and discussed results of MRI.  Nancy Suarez verbalized understanding of results, but reported that patient has had some changes recently.  Nancy Suarez states that Saturday and yesterday the pt was irritable, delusional and erratic at times..   Daughter reports that this is not a new issue, but was worse on Saturday and yesterday.  Daughter states patient was advised by Dr. Petra Kuba to discontinue the gabapentin and the Ambien.  Daughter states patient has been off the gabapentin for 3 weeks, and Ambien for 2 weeks.  Daughter wanted to know if Dr. Frances Furbish thought the Aricept with Namenda needed to be added this time?  I discussed verbally with Dr. Frances Furbish and she states given the changes in the patient's mood she would not in the Aricept or Namenda at this time.  She recommends that the patient follow-up with Dr. Petra Kuba and see if the gabapentin or the Ambien should be added back?  Dr. Frances Furbish would like to leave this decision up to Dr. Petra Kuba.   I called the pt's daughter back and advised of Dr. Teofilo Pod recommendation. She verbalized understanding and is agreeable with talking with Dr. Petra Kuba on what should happen from here. She will keep [place] posted on this.

## 2020-09-28 NOTE — Telephone Encounter (Signed)
-----   Message from Huston Foley, MD sent at 09/27/2020  9:31 AM EDT ----- Please call patient or daughter (on [place]) and advise them that her brain MRI without contrast did not show any acute findings, and only chronic, age-appropriate findings were seen.  Overall reassuring.  She can follow-up in this clinic as scheduled.

## 2020-11-04 DIAGNOSIS — M2550 Pain in unspecified joint: Secondary | ICD-10-CM | POA: Diagnosis not present

## 2020-11-04 DIAGNOSIS — Z6828 Body mass index (BMI) 28.0-28.9, adult: Secondary | ICD-10-CM | POA: Diagnosis not present

## 2020-11-04 DIAGNOSIS — Q820 Hereditary lymphedema: Secondary | ICD-10-CM | POA: Diagnosis not present

## 2020-11-04 DIAGNOSIS — G4700 Insomnia, unspecified: Secondary | ICD-10-CM | POA: Diagnosis not present

## 2020-11-04 DIAGNOSIS — Z79891 Long term (current) use of opiate analgesic: Secondary | ICD-10-CM | POA: Diagnosis not present

## 2020-12-21 ENCOUNTER — Ambulatory Visit: Payer: Medicare Other | Admitting: Neurology

## 2021-02-08 DIAGNOSIS — M2550 Pain in unspecified joint: Secondary | ICD-10-CM | POA: Diagnosis not present

## 2021-02-08 DIAGNOSIS — G4700 Insomnia, unspecified: Secondary | ICD-10-CM | POA: Diagnosis not present

## 2021-02-08 DIAGNOSIS — Z79891 Long term (current) use of opiate analgesic: Secondary | ICD-10-CM | POA: Diagnosis not present

## 2021-02-08 DIAGNOSIS — Z6828 Body mass index (BMI) 28.0-28.9, adult: Secondary | ICD-10-CM | POA: Diagnosis not present

## 2021-02-08 DIAGNOSIS — Q820 Hereditary lymphedema: Secondary | ICD-10-CM | POA: Diagnosis not present

## 2021-02-28 ENCOUNTER — Telehealth: Payer: Self-pay | Admitting: Neurology

## 2021-02-28 NOTE — Telephone Encounter (Signed)
Pt's daughter, Viviann Broyles (on [place]) called, she has become more agitated, wandering around yesterday and fell. Would like a call from the nurse.

## 2021-02-28 NOTE — Telephone Encounter (Signed)
I called daughter back. Pt having worsening memory noted by her and family friends.  Last seen 08/2020, in June daughter came down with COVID and was not able to keep appt 12/2020.  Availability 03-01-21 at 0800, she was ok to come then.  Arrive 0730 for MMSE.  (She relayed did not show any sx of UTIor underlying infection).  She appreciated call back

## 2021-03-01 ENCOUNTER — Ambulatory Visit: Payer: Medicare Other | Admitting: Neurology

## 2021-03-01 ENCOUNTER — Encounter: Payer: Self-pay | Admitting: Neurology

## 2021-03-01 VITALS — BP 150/65 | HR 61 | Ht 60.0 in | Wt 149.2 lb

## 2021-03-01 DIAGNOSIS — Z9181 History of falling: Secondary | ICD-10-CM | POA: Diagnosis not present

## 2021-03-01 DIAGNOSIS — R410 Disorientation, unspecified: Secondary | ICD-10-CM | POA: Diagnosis not present

## 2021-03-01 DIAGNOSIS — R413 Other amnesia: Secondary | ICD-10-CM | POA: Diagnosis not present

## 2021-03-01 MED ORDER — MEMANTINE HCL 5 MG PO TABS: 5.0000 mg | ORAL_TABLET | Freq: Two times a day (BID) | ORAL | 3 refills | Status: DC

## 2021-03-01 NOTE — Progress Notes (Signed)
Subjective:    Patient ID: Nancy Suarez is a 87 y.o. female.  HPI    Interim history:   [name] is an 87 year old right-handed woman with an underlying medical history of reflux disease, goiter, hypertension, hyperlipidemia, osteopenia, depression, pancreatitis, spinal arthritis, scoliosis, anemia, chronic kidney disease, anxiety, joint pain and insomnia, who presents for FU consultation of her memory loss. The patient is accompanied by her daughter, [name], again today.  I first met her on 09/06/20 at the request of her PCP, at which time she was reported to have forgetfulness for at least 6 months.  Her MMSE was 20/30 at the time. I suggest we proceed with additional blood work and a brain MRI.  Her labs included Hemoglobin A1c, vitamin B6, vitamin D, CRP, rheumatoid factor, B12 and B1.Her rheumatoid factor was positive.  Other test results were benign.  She was notified of the results.We had also talked about medication affecting her cognitive function as she was on multiple medications at the time.SheWas advised to discuss medication management with her primary care physician.She had a brain MRI without contrast on 09/23/2020 and I reviewed the results:   IMPRESSION: Unremarkable MRI scan of the brain without contrast showing only mild age-related changes of chronic small vessel disease and generalized cerebral atrophy.  She was notified of the test results.  Today, 03/01/21: She reports feeling fairly stable, she does not give much in the way of her own history but reports that she has been on Ambien for many years.  She is reluctant to come off of it.  Daughter reports that patient has been staying with her for nearly 2 years in December of this year.  Patient has had recent mood irritability and frustration.  Daughter reports that her stepson moved in recently and is currently staying with them, he is 71 years old.  Stepdaughter is 22 years old and was also staying with them temporarily and  just a few days ago moved out.  While the stepchildren were staying in their home patient's daughter reports that she took a brief trip with her husband for their anniversary.  While she was gone, patient had more mood irritability and refused to take her medications.  The day.  They came back this past weekend, patient had taken her Ambien and had a fall as she got up shortly after taking the Ambien.  Thankfully, she did not injure herself.  She did not lose consciousness or hit her head but daughter reports that she looked really dazed and groggy.  Patient's daughter is still concerned about patient's medications.  However, her primary care physician has been able to reduce her medications, she is currently on Ambien 5 mg strength half a pill daily at bedtime.  Gabapentin has been reduced from 300 mg to 100 mg and she currently takes it only once a day.  Hydrocodone is currently twice daily as needed.  She typically does take it twice daily.  She continues to be on Remeron 15 mg at bedtime.  She continues to be on Lexapro 10 mg daily.  Memory function per se is stable with the exception that patient has confusion from time to time per daughter.  Patient lives with [name], [place] husband and their 51 year old daughter.  Per [name], patient is very close to her 44 year old granddaughter but due to recent mood irritability, that relationship has been strained.  Daughter just started school after the summer break, fifth grade.  The patient's allergies, current medications, family history, past  medical history, past social history, past surgical history and problem list were reviewed and updated as appropriate.   Previously:   09/06/20: (She) reports having difficulty with her memory.  She is unable to provide details of her history and most of her history is provided by her daughter.  Patient has had forgetfulness and mood irritability for the past 6+ months.  She moved in with her daughter who is her only child in  late 2020 after her husband passed away.  In the past 6+ months she has had difficulty with her sleep, she tends to be up longer at night, goes to bed around 2 AM or later.  She has had confusion at night and also disorientation, she has had mood irritability and some anger outbursts.  She has not had any obvious hallucinations.  She takes Ambien at night for sleep, this was reduced to 5 mg recently.  She has been on Ambien for years.  She is also on Remeron.   I reviewed your office note from 06/15/2000.  She complained of short-term memory problem and forgetfulness.  Of note, she is on multiple medications including narcotic pain medication in the form of generic Percocet 1 pill every 4 hours as needed, she is also on gabapentin 300 mg 4 times a day, she is on Remeron 15 mg at bedtime and has also been on Ambien, 10 mg strength.  She had blood work through your office on 06/15/2020 and I reviewed the results: Total cholesterol 157, HDL 75, triglycerides 66, LDL 67, glucose 78, creatinine elevated at 1.54, BUN 19, AST 13, ALT 8, uric acid 5.9, TSH 0.89, CBC with differential showed WBC of 5.2, red blood cell count of 3.35, below normal, hemoglobin low at 9.7, hematocrit low at 29.7. He had a head CT and cervical spine CT without contrast after a fall on 06/06/2017 and I reviewed the results:  IMPRESSION: Head CT: No acute or traumatic finding. Mild age related atrophy and small-vessel ischemic change.   Cervical spine CT: No acute or traumatic finding. Extensive degenerative changes throughout the region as outlined above.   She has not had any falls in the past 6 months.  She has a reasonably good appetite and hydrates with water.   She had a sleep study several years ago and indicates that she was diagnosed with sleep apnea, she was advised to use a CPAP machine at the time but did not pursue it.  Daughter reports no significant snoring currently.   Patient has a family history of memory loss  affecting her brother.  He died at the age of 66, he was 29 years older and had dementia.  Another brother died in his 60s.  Neither parent had memory loss or dementia, father lived to be 18 and mother lived to be 90.  The patient has 1 daughter, she lives with her daughter, son-in-law, granddaughter and step grandson.     She quit smoking in 1980, she does not currently drink any alcohol and does not have a history of heavy alcohol use.  She drinks caffeine in the form of soda, 1 serving per day on average.  She has chronic back pain and arthritis issues.  She has had swelling in both ankles which has become worse in the recent past.  Her Past Medical History Is Significant For: Past Medical History:  Diagnosis Date   ASCUS (atypical squamous cells of undetermined significance) on Pap smear    Atrophic vaginitis    Chronic  insomnia    Depression    GERD (gastroesophageal reflux disease)    Goiter    History of kidney stones    Hypercholesteremia    Hypertension    LGSIL (low grade squamous intraepithelial dysplasia)    Osteopenia    Ovarian cyst    "shrank it"   Pancreatitis    Scoliosis    Spinal arthritis     Her Past Surgical History Is Significant For: Past Surgical History:  Procedure Laterality Date   ABDOMINAL HYSTERECTOMY  1977   partial   KIDNEY STONE SURGERY  1990's   2-3 stones x1 surgery   ORIF FEMUR FRACTURE Left 06/09/2017   Procedure: OPEN REDUCTION INTERNAL FIXATION (ORIF) DISTAL FEMUR FRACTURE;  Surgeon: Paralee Cancel, MD;  Location: [facility];  Service: Orthopedics;  Laterality: Left;   TONSILLECTOMY  as teenager   TOTAL KNEE ARTHROPLASTY Left 01/19/2014   Procedure: LEFT TOTAL KNEE ARTHROPLASTY;  Surgeon: Mauri Pole, MD;  Location: [facility];  Service: Orthopedics;  Laterality: Left;   WISDOM TOOTH EXTRACTION      Her Family History Is Significant For: Family History  Problem Relation Age of Onset   Hypertension Father    Cancer Father    Cancer Mother         Bone   Cancer Brother     Her Social History Is Significant For: Social History   Socioeconomic History   Marital status: Married    Spouse name: Not on file   Number of children: Not on file   Years of education: Not on file   Highest education level: Not on file  Occupational History   Not on file  Tobacco Use   Smoking status: Former    Years: 20.00    Types: Cigarettes    Quit date: 07/03/1978    Years since quitting: 42.6   Smokeless tobacco: Never  Substance and Sexual Activity   Alcohol use: Not Currently    Comment: social   Drug use: No   Sexual activity: Yes    Birth control/protection: Surgical    Comment: hysterectomy  Other Topics Concern   Not on file  Social History Narrative   Not on file   Social Determinants of Health   Financial Resource Strain: Not on file  Food Insecurity: Not on file  Transportation Needs: Not on file  Physical Activity: Not on file  Stress: Not on file  Social Connections: Not on file    Her Allergies Are:  Allergies  Allergen Reactions   Aspirin     Upsets stomach if 353m   Codeine Nausea And Vomiting   Sulfa Antibiotics Nausea Only  :   Her Current Medications Are:  Outpatient Encounter Medications as of 03/01/2021  Medication Sig   acetaminophen (TYLENOL) 325 MG tablet Take 2 tablets (650 mg total) by mouth every 4 (four) hours as needed for mild pain ((score 1 to 3) or temp > 100.5).   atorvastatin (LIPITOR) 20 MG tablet Take 20 mg by mouth every evening.    calcium-vitamin D (OSCAL WITH D) 500-200 MG-UNIT per tablet Take 1 tablet by mouth 2 (two) times daily.   carboxymethylcellulose (REFRESH PLUS) 0.5 % SOLN Place 1 drop into both eyes as needed (Dry eyes).   cetirizine (ZYRTEC) 10 MG tablet Take 10 mg by mouth as needed for allergies.   docusate sodium 100 MG CAPS Take 100 mg by mouth 2 (two) times daily.   escitalopram (LEXAPRO) 10 MG tablet Take 10 mg  by mouth daily.   famotidine (PEPCID) 20 MG tablet  famotidine 20 mg tablet  TAKE 1 TABLET BY MOUTH TWICE DAILY   gabapentin (NEURONTIN) 300 MG capsule Take 300 mg by mouth every 6 (six) hours.   HYDROcodone-acetaminophen (NORCO/VICODIN) 5-325 MG tablet Take 1-2 tablets by mouth every 6 (six) hours as needed for moderate pain ((score 4 to 6)).   losartan-hydrochlorothiazide (HYZAAR) 100-25 MG tablet losartan 100 mg-hydrochlorothiazide 25 mg tablet  TAKE 1 TABLET BY MOUTH EVERY DAY   mirtazapine (REMERON) 15 MG tablet Take 15 mg by mouth at bedtime.   Multiple Vitamin (MULTIVITAMIN WITH MINERALS) TABS tablet Take 1 tablet by mouth daily.   zolpidem (AMBIEN) 5 MG tablet Take 2.5 mg by mouth at bedtime as needed for sleep.   [DISCONTINUED] omeprazole (PRILOSEC) 20 MG capsule Take 20 mg by mouth daily.   No facility-administered encounter medications on file as of 03/01/2021.  :  Review of Systems:  Out of a complete 14 point review of systems, all are reviewed and negative with the exception of these symptoms as listed below:  Review of Systems  Neurological:        Pt daughter states pt is saying that she is asking abnormal questions like where am I at. Pt does know who hre daughter and husband are. . Pt daughter states there is some change in memory loss . Pt daughter states after she took her Ambien confusion increased . Pt did fall but family didn't take to ED .      Objective:  Neurological Exam  Physical Exam Physical Examination:   Vitals:   03/01/21 0810  BP: (!) 150/65  Pulse: 61    General Examination: The patient is a very pleasant 87 y.o. female in no acute distress. She appears well-developed and well-nourished and well groomed.  She is in a transport chair.  HEENT: Normocephalic, atraumatic, pupils are equal, round and reactive to light, corrective eyeglasses in place, tracking is fairly well preserved, face is symmetric with normal facial animation, speech is clear without dysarthria, hearing is grossly intact, neck is  supple, no carotid bruits, full range of motion.  Airway examination reveals mild to moderate mouth dryness, adequate dental hygiene.  Tongue protrudes centrally and palate elevates symmetrically.    Chest: Clear to auscultation without wheezing, rhonchi or crackles noted.   Heart: S1+S2+0, regular and normal without murmurs, rubs or gallops noted.    Abdomen: Soft, non-tender and non-distended with normal bowel sounds appreciated on auscultation.   Extremities: There is 2+ pitting edema in the distal lower extremities bilaterally, mostly around the ankles, pedal pulses are difficult to palpate.   Skin: Warm and dry without trophic changes noted.    Musculoskeletal: exam reveals no obvious joint deformities.    Neurologically:  Mental status: The patient is awake, alert and pays good attention.  She is able to provide some parts of her history.  Details are provided by her daughter.    On 09/06/2020: MMSE: 20/30, CDT: 4/4, AFT: 8/min.  On 03/01/2021: MMSE: 21/30, CDT: 3/4, AFT: 6/min.   Thought process is linear. Mood is normal and affect is normal.  Cranial nerves II - XII are as described above under HEENT exam. In addition: shoulder shrug is normal with equal shoulder height noted. Motor exam: Normal bulk, global strength of 4+/5, and normal tone is noted. There is no drift, tremor or rebound. Romberg is not tested due to safety concerns.  Gait and balance are not tested because  she did not bring her walker.  She uses a walker at home and is in a wheelchair currently.  Reflexes are 1+ in the upper extremities and absent in the lower extremities. Fine motor skills and coordination: Grossly intact in the upper and lower extremities.   Cerebellar testing: No dysmetria or intention tremor.  Sensory exam: intact to light touch in the upper and lower extremities.    Assessment and Plan:  In summary, Charlie R Escutia is a very pleasant 87 year old female with an underlying medical history of reflux  disease, goiter, hypertension, hyperlipidemia, osteopenia, depression, pancreatitis, spinal arthritis, scoliosis, anemia, chronic kidney disease, anxiety, joint pain and insomnia, who presents for follow-up consultation of her memory loss of approximately 1 years duration.  She does have a family history of memory loss in her older brother.  She also has vascular risk factors.  We again talked about how medications can affect her memory function and her mental sharpness.  She has had some medication adjustments in the interim, gabapentin has been reduced and Ambien has been reduced.  She continues to take hydrocodone typically twice daily for chronic back pain.   We did some blood work in the recent past.  Brain MRI from March 2022 showed mostly age-appropriate findings which is reassuring.  We talked about the importance of healthy lifestyle, good nutrition and good hydration.  We also talked about memory medications.  Given her borderline low heart rate, I would be reluctant to start her on Aricept which can cause bradycardia.  I explained this to the patient and her daughter.  She is advised to start low-dose Namenda generic 5 mg strength once daily in the evening and we will gently increase this in about a month if possible to 1 pill twice daily.  We will increase further at the next visit to up to 10 mg twice daily if possible.  She is advised to continue to work on her medication regimen with the help of her primary care physician.  There may still be a component of medication effect, daughter is particularly concerned about her grogginess and confusion.  Patient is particularly advised not to try to get out of bed to walk around once she is ready to go to bed and has taken her Ambien and Remeron. She is advised to keep close follow-up with her primary care physician.  We will plan a follow-up in this clinic for her to see one of our nurse practitioners in about 3 months, sooner if needed. I answered all  their questions today and the patient and her daughter were in agreement.  I spent 40 minutes in total face-to-face time and in reviewing records during pre-charting, more than 50% of which was spent in counseling and coordination of care, reviewing test results, reviewing medications and treatment regimen and/or in discussing or reviewing the diagnosis of memory loss, the prognosis and treatment options. Pertinent laboratory and imaging test results that were available during this visit with the patient were reviewed by me and considered in my medical decision making (see chart for details).

## 2021-03-01 NOTE — Patient Instructions (Addendum)
It was nice to see you both again today.  As discussed, your memory scores are actually stable.  I still believe that there is a component of medication interaction that affects your mental sharpness and causes you to be confused and off balance at times.  Please do not take your Ambien and then walk around.  You have fallen recently after taking your Ambien and getting out of bed.  Your brain MRI showed fairly reassuring findings, mostly age-appropriate findings.  As discussed, we will start you on a low-dose of a memory medication called Namenda (generic name: Memantine), starting at 5 mg once daily for a month, you can take it at bedtime.  After a month you can increase it to twice daily and we will gradually increase this further later on as long as you tolerate the medication. Please note that side effects may include, but are not limited to: nausea, confusion, hallucination, personality changes. If you are having mild side effects, try to stick with the treatment as these initial side effects may go away after the first 10-14 days.

## 2021-03-04 DIAGNOSIS — M1711 Unilateral primary osteoarthritis, right knee: Secondary | ICD-10-CM | POA: Diagnosis not present

## 2021-03-04 DIAGNOSIS — Z96652 Presence of left artificial knee joint: Secondary | ICD-10-CM | POA: Diagnosis not present

## 2021-03-04 DIAGNOSIS — M25561 Pain in right knee: Secondary | ICD-10-CM | POA: Diagnosis not present

## 2021-03-04 DIAGNOSIS — M25562 Pain in left knee: Secondary | ICD-10-CM | POA: Diagnosis not present

## 2021-03-10 ENCOUNTER — Encounter: Payer: Self-pay | Admitting: Neurology

## 2021-03-30 ENCOUNTER — Encounter: Payer: Self-pay | Admitting: Neurology

## 2021-04-13 ENCOUNTER — Other Ambulatory Visit: Payer: Self-pay

## 2021-04-13 ENCOUNTER — Emergency Department (HOSPITAL_BASED_OUTPATIENT_CLINIC_OR_DEPARTMENT_OTHER): Payer: Medicare Other

## 2021-04-13 ENCOUNTER — Encounter (HOSPITAL_BASED_OUTPATIENT_CLINIC_OR_DEPARTMENT_OTHER): Payer: Self-pay

## 2021-04-13 ENCOUNTER — Inpatient Hospital Stay (HOSPITAL_BASED_OUTPATIENT_CLINIC_OR_DEPARTMENT_OTHER)
Admission: EM | Admit: 2021-04-13 | Discharge: 2021-04-17 | DRG: 682 | Disposition: A | Discharge status: Home / Self Care | Payer: Medicare Other | Attending: Internal Medicine | Admitting: Internal Medicine

## 2021-04-13 DIAGNOSIS — F03918 Unspecified dementia, unspecified severity, with other behavioral disturbance: Secondary | ICD-10-CM | POA: Diagnosis not present

## 2021-04-13 DIAGNOSIS — N390 Urinary tract infection, site not specified: Secondary | ICD-10-CM | POA: Diagnosis not present

## 2021-04-13 DIAGNOSIS — Z87442 Personal history of urinary calculi: Secondary | ICD-10-CM | POA: Diagnosis not present

## 2021-04-13 DIAGNOSIS — R4189 Other symptoms and signs involving cognitive functions and awareness: Secondary | ICD-10-CM | POA: Diagnosis not present

## 2021-04-13 DIAGNOSIS — Z634 Disappearance and death of family member: Secondary | ICD-10-CM

## 2021-04-13 DIAGNOSIS — Z885 Allergy status to narcotic agent status: Secondary | ICD-10-CM

## 2021-04-13 DIAGNOSIS — R109 Unspecified abdominal pain: Secondary | ICD-10-CM | POA: Diagnosis not present

## 2021-04-13 DIAGNOSIS — Z96652 Presence of left artificial knee joint: Secondary | ICD-10-CM | POA: Diagnosis not present

## 2021-04-13 DIAGNOSIS — Z79891 Long term (current) use of opiate analgesic: Secondary | ICD-10-CM | POA: Diagnosis not present

## 2021-04-13 DIAGNOSIS — N1831 Chronic kidney disease, stage 3a: Secondary | ICD-10-CM | POA: Diagnosis present

## 2021-04-13 DIAGNOSIS — Z886 Allergy status to analgesic agent status: Secondary | ICD-10-CM

## 2021-04-13 DIAGNOSIS — F32A Depression, unspecified: Secondary | ICD-10-CM | POA: Diagnosis present

## 2021-04-13 DIAGNOSIS — M479 Spondylosis, unspecified: Secondary | ICD-10-CM | POA: Diagnosis not present

## 2021-04-13 DIAGNOSIS — K219 Gastro-esophageal reflux disease without esophagitis: Secondary | ICD-10-CM | POA: Diagnosis not present

## 2021-04-13 DIAGNOSIS — F5104 Psychophysiologic insomnia: Secondary | ICD-10-CM | POA: Diagnosis not present

## 2021-04-13 DIAGNOSIS — M8580 Other specified disorders of bone density and structure, unspecified site: Secondary | ICD-10-CM | POA: Diagnosis present

## 2021-04-13 DIAGNOSIS — N1832 Chronic kidney disease, stage 3b: Secondary | ICD-10-CM | POA: Diagnosis not present

## 2021-04-13 DIAGNOSIS — H9190 Unspecified hearing loss, unspecified ear: Secondary | ICD-10-CM | POA: Diagnosis present

## 2021-04-13 DIAGNOSIS — Z90711 Acquired absence of uterus with remaining cervical stump: Secondary | ICD-10-CM

## 2021-04-13 DIAGNOSIS — M419 Scoliosis, unspecified: Secondary | ICD-10-CM | POA: Diagnosis present

## 2021-04-13 DIAGNOSIS — Z808 Family history of malignant neoplasm of other organs or systems: Secondary | ICD-10-CM | POA: Diagnosis not present

## 2021-04-13 DIAGNOSIS — I129 Hypertensive chronic kidney disease with stage 1 through stage 4 chronic kidney disease, or unspecified chronic kidney disease: Secondary | ICD-10-CM | POA: Diagnosis not present

## 2021-04-13 DIAGNOSIS — N179 Acute kidney failure, unspecified: Secondary | ICD-10-CM | POA: Diagnosis not present

## 2021-04-13 DIAGNOSIS — G9341 Metabolic encephalopathy: Secondary | ICD-10-CM | POA: Insufficient documentation

## 2021-04-13 DIAGNOSIS — I10 Essential (primary) hypertension: Secondary | ICD-10-CM | POA: Diagnosis not present

## 2021-04-13 DIAGNOSIS — E875 Hyperkalemia: Secondary | ICD-10-CM | POA: Diagnosis not present

## 2021-04-13 DIAGNOSIS — N183 Chronic kidney disease, stage 3 unspecified: Secondary | ICD-10-CM | POA: Diagnosis not present

## 2021-04-13 DIAGNOSIS — R319 Hematuria, unspecified: Secondary | ICD-10-CM | POA: Diagnosis not present

## 2021-04-13 DIAGNOSIS — Z9181 History of falling: Secondary | ICD-10-CM

## 2021-04-13 DIAGNOSIS — E785 Hyperlipidemia, unspecified: Secondary | ICD-10-CM | POA: Diagnosis present

## 2021-04-13 DIAGNOSIS — R4020 Unspecified coma: Secondary | ICD-10-CM | POA: Diagnosis not present

## 2021-04-13 DIAGNOSIS — E7800 Pure hypercholesterolemia, unspecified: Secondary | ICD-10-CM | POA: Diagnosis not present

## 2021-04-13 DIAGNOSIS — Z79899 Other long term (current) drug therapy: Secondary | ICD-10-CM

## 2021-04-13 DIAGNOSIS — Z20822 Contact with and (suspected) exposure to covid-19: Secondary | ICD-10-CM | POA: Diagnosis not present

## 2021-04-13 DIAGNOSIS — G9340 Encephalopathy, unspecified: Secondary | ICD-10-CM

## 2021-04-13 DIAGNOSIS — N170 Acute kidney failure with tubular necrosis: Secondary | ICD-10-CM | POA: Diagnosis not present

## 2021-04-13 DIAGNOSIS — R4182 Altered mental status, unspecified: Secondary | ICD-10-CM | POA: Diagnosis not present

## 2021-04-13 DIAGNOSIS — Z8249 Family history of ischemic heart disease and other diseases of the circulatory system: Secondary | ICD-10-CM

## 2021-04-13 DIAGNOSIS — T50915A Adverse effect of multiple unspecified drugs, medicaments and biological substances, initial encounter: Secondary | ICD-10-CM | POA: Diagnosis present

## 2021-04-13 DIAGNOSIS — Z882 Allergy status to sulfonamides status: Secondary | ICD-10-CM

## 2021-04-13 DIAGNOSIS — R5381 Other malaise: Secondary | ICD-10-CM

## 2021-04-13 DIAGNOSIS — Z87891 Personal history of nicotine dependence: Secondary | ICD-10-CM

## 2021-04-13 DIAGNOSIS — N281 Cyst of kidney, acquired: Secondary | ICD-10-CM | POA: Diagnosis not present

## 2021-04-13 LAB — RESP PANEL BY RT-PCR (FLU A&B, COVID) ARPGX2
Influenza A by PCR: NEGATIVE
Influenza B by PCR: NEGATIVE
SARS Coronavirus 2 by RT PCR: NEGATIVE

## 2021-04-13 LAB — URINALYSIS, MICROSCOPIC (REFLEX)

## 2021-04-13 LAB — CBC WITH DIFFERENTIAL/PLATELET
Abs Immature Granulocytes: 0.02 10*3/uL (ref 0.00–0.07)
Basophils Absolute: 0 10*3/uL (ref 0.0–0.1)
Basophils Relative: 1 %
Eosinophils Absolute: 0.1 10*3/uL (ref 0.0–0.5)
Eosinophils Relative: 2 %
HCT: 28.7 % — ABNORMAL LOW (ref 36.0–46.0)
Hemoglobin: 8.8 g/dL — ABNORMAL LOW (ref 12.0–15.0)
Immature Granulocytes: 0 %
Lymphocytes Relative: 22 %
Lymphs Abs: 1 10*3/uL (ref 0.7–4.0)
MCH: 28.5 pg (ref 26.0–34.0)
MCHC: 30.7 g/dL (ref 30.0–36.0)
MCV: 92.9 fL (ref 80.0–100.0)
Monocytes Absolute: 0.7 10*3/uL (ref 0.1–1.0)
Monocytes Relative: 14 %
Neutro Abs: 2.8 10*3/uL (ref 1.7–7.7)
Neutrophils Relative %: 61 %
Platelets: 177 10*3/uL (ref 150–400)
RBC: 3.09 MIL/uL — ABNORMAL LOW (ref 3.87–5.11)
RDW: 15.4 % (ref 11.5–15.5)
WBC: 4.6 10*3/uL (ref 4.0–10.5)
nRBC: 0 % (ref 0.0–0.2)

## 2021-04-13 LAB — COMPREHENSIVE METABOLIC PANEL
ALT: 15 U/L (ref 0–44)
AST: 16 U/L (ref 15–41)
Albumin: 3.8 g/dL (ref 3.5–5.0)
Alkaline Phosphatase: 74 U/L (ref 38–126)
Anion gap: 8 (ref 5–15)
BUN: 42 mg/dL — ABNORMAL HIGH (ref 8–23)
CO2: 24 mmol/L (ref 22–32)
Calcium: 8.8 mg/dL — ABNORMAL LOW (ref 8.9–10.3)
Chloride: 108 mmol/L (ref 98–111)
Creatinine, Ser: 2.7 mg/dL — ABNORMAL HIGH (ref 0.44–1.00)
GFR, Estimated: 17 mL/min — ABNORMAL LOW (ref >60)
Glucose, Bld: 85 mg/dL (ref 70–99)
Potassium: 5.2 mmol/L — ABNORMAL HIGH (ref 3.5–5.1)
Sodium: 140 mmol/L (ref 135–145)
Total Bilirubin: 0.3 mg/dL (ref 0.3–1.2)
Total Protein: 6.7 g/dL (ref 6.5–8.1)

## 2021-04-13 LAB — URINALYSIS, ROUTINE W REFLEX MICROSCOPIC
Bilirubin Urine: NEGATIVE
Glucose, UA: NEGATIVE mg/dL
Hgb urine dipstick: NEGATIVE
Ketones, ur: 5 mg/dL — AB
Nitrite: NEGATIVE
Protein, ur: NEGATIVE mg/dL
Specific Gravity, Urine: 1.02 (ref 1.005–1.030)
pH: 5.5 (ref 5.0–8.0)

## 2021-04-13 MED ORDER — SODIUM CHLORIDE 0.9 % IV SOLN
1.0000 g | Freq: Once | INTRAVENOUS | Status: AC
  Administered 2021-04-13: 1 g via INTRAVENOUS
  Filled 2021-04-13: qty 10

## 2021-04-13 MED ORDER — LACTATED RINGERS IV BOLUS
1000.0000 mL | Freq: Once | INTRAVENOUS | Status: AC
  Administered 2021-04-13: 1000 mL via INTRAVENOUS

## 2021-04-13 NOTE — ED Provider Notes (Signed)
[facility] EMERGENCY DEPARTMENT Provider Note   CSN: [id number] Arrival date & time: 04/13/21  1706     History Chief Complaint  Patient presents with   Medical Clearance    Nancy Suarez is a 87 y.o. female. Level 5 caveat due to some dementia with some confusion HPI Patient presents with worsening confusion.  Has had confusion for months now thought to be secondary to some dementia.  Potentiate medication reactions.  However reportedly has not slept.  Up all night last night.  Had been hallucinating and seeing people at her house today that were not there.  She has had some hallucinations before but this was much more severe.  Has been worse over the last week.  May have had some decreased oral intake.  Not necessarily dangerous at home but more confused.  Has had falls.    Past Medical History:  Diagnosis Date   ASCUS (atypical squamous cells of undetermined significance) on Pap smear    Atrophic vaginitis    Chronic insomnia    Depression    GERD (gastroesophageal reflux disease)    Goiter    History of kidney stones    Hypercholesteremia    Hypertension    LGSIL (low grade squamous intraepithelial dysplasia)    Osteopenia    Ovarian cyst    "shrank it"   Pancreatitis    Scoliosis    Spinal arthritis     Patient Active Problem List   Diagnosis Date Noted   Essential hypertension 06/05/2017   HLD (hyperlipidemia) 06/05/2017   Depression 06/05/2017   Iron deficiency anemia 06/05/2017   Acute renal failure superimposed on stage 3 chronic kidney disease (HCC) 06/05/2017   Closed fracture of left distal femur (HCC) 06/05/2017   Fall    Overweight (BMI 25.0-29.9) 01/21/2014   Postoperative anemia due to acute blood loss 01/21/2014   S/P left TKA 01/19/2014   Scoliosis    Osteopenia    Chronic insomnia    Atrophic vaginitis    Spinal arthritis    GERD (gastroesophageal reflux disease)    ASCUS (atypical squamous cells of undetermined significance)  on Pap smear    Ovarian cyst    LGSIL (low grade squamous intraepithelial dysplasia)     Past Surgical History:  Procedure Laterality Date   ABDOMINAL HYSTERECTOMY  1977   partial   KIDNEY STONE SURGERY  1990's   2-3 stones x1 surgery   ORIF FEMUR FRACTURE Left 06/09/2017   Procedure: OPEN REDUCTION INTERNAL FIXATION (ORIF) DISTAL FEMUR FRACTURE;  Surgeon: Durene Romans, MD;  Location: [facility];  Service: Orthopedics;  Laterality: Left;   TONSILLECTOMY  as teenager   TOTAL KNEE ARTHROPLASTY Left 01/19/2014   Procedure: LEFT TOTAL KNEE ARTHROPLASTY;  Surgeon: Shelda Pal, MD;  Location: [facility];  Service: Orthopedics;  Laterality: Left;   WISDOM TOOTH EXTRACTION       OB History     Gravida  1   Para  1   Term      Preterm      AB      Living  1      SAB      IAB      Ectopic      Multiple      Live Births  1           Family History  Problem Relation Age of Onset   Hypertension Father    Cancer Father    Cancer Mother  Bone   Cancer Brother     Social History   Tobacco Use   Smoking status: Former    Years: 20.00    Types: Cigarettes    Quit date: 07/03/1978    Years since quitting: 42.8   Smokeless tobacco: Former  Building services engineer Use: Never used  Substance Use Topics   Alcohol use: Yes    Comment: occ   Drug use: No    Home Medications Prior to Admission medications   Medication Sig Start Date End Date Taking? Authorizing Provider  acetaminophen (TYLENOL) 325 MG tablet Take 2 tablets (650 mg total) by mouth every 4 (four) hours as needed for mild pain ((score 1 to 3) or temp > 100.5). 06/12/17   Alison Murray, MD  atorvastatin (LIPITOR) 20 MG tablet Take 20 mg by mouth every evening.  03/29/17   [provider]  calcium-vitamin D (OSCAL WITH D) 500-200 MG-UNIT per tablet Take 1 tablet by mouth 2 (two) times daily.    [provider]  carboxymethylcellulose (REFRESH PLUS) 0.5 % SOLN Place 1 drop into both eyes  as needed (Dry eyes).    [provider]  cetirizine (ZYRTEC) 10 MG tablet Take 10 mg by mouth as needed for allergies.    [provider]  docusate sodium 100 MG CAPS Take 100 mg by mouth 2 (two) times daily. 01/21/14   Lanney Gins, PA-C  escitalopram (LEXAPRO) 10 MG tablet Take 10 mg by mouth daily.    [provider]  famotidine (PEPCID) 20 MG tablet famotidine 20 mg tablet  TAKE 1 TABLET BY MOUTH TWICE DAILY    [provider]  gabapentin (NEURONTIN) 300 MG capsule Take 300 mg by mouth every 6 (six) hours. 03/04/17   [provider]  HYDROcodone-acetaminophen (NORCO/VICODIN) 5-325 MG tablet Take 1-2 tablets by mouth every 6 (six) hours as needed for moderate pain ((score 4 to 6)). 06/11/17   Durene Romans, MD  losartan-hydrochlorothiazide (HYZAAR) 100-25 MG tablet losartan 100 mg-hydrochlorothiazide 25 mg tablet  TAKE 1 TABLET BY MOUTH EVERY DAY    [provider]  memantine (NAMENDA) 5 MG tablet Take 1 tablet (5 mg total) by mouth 2 (two) times daily. Follow titration instructions provided separately in writing. 03/01/21   Huston Foley, MD  mirtazapine (REMERON) 15 MG tablet Take 15 mg by mouth at bedtime.    [provider]  Multiple Vitamin (MULTIVITAMIN WITH MINERALS) TABS tablet Take 1 tablet by mouth daily.    [provider]  zolpidem (AMBIEN) 5 MG tablet Take 2.5 mg by mouth at bedtime as needed for sleep.    [provider]    Allergies    Aspirin, Codeine, and Sulfa antibiotics  Review of Systems   Review of Systems  Unable to perform ROS: Mental status change   Physical Exam Updated Vital Signs BP (!) 165/68   Pulse 69   Temp 98.3 F (36.8 C) (Oral)   Resp 18   Ht 5\' 5"  (1.651 m)   Wt 61.2 kg   SpO2 100%   BMI 22.47 kg/m   Physical Exam Vitals and nursing note reviewed.  Constitutional:      Appearance: Normal appearance.  HENT:     Head: Atraumatic.  Eyes:     Pupils: Pupils are  equal, round, and reactive to light.  Cardiovascular:     Rate and Rhythm: Regular rhythm.  Pulmonary:     Breath sounds: No wheezing or rhonchi.  Abdominal:     Tenderness: There is no abdominal tenderness.  Musculoskeletal:        General: No tenderness.     Cervical back: Neck supple.     Right lower leg: No edema.     Left lower leg: No edema.  Skin:    General: Skin is warm.     Capillary Refill: Capillary refill takes less than 2 seconds.  Neurological:     Mental Status: She is alert.     Comments: Awake and pleasant but some confusion.  Is able to tell me about how she saw people in her room that other people said were not there.    ED Results / Procedures / Treatments   Labs (all labs ordered are listed, but only abnormal results are displayed) Labs Reviewed  COMPREHENSIVE METABOLIC PANEL - Abnormal; Notable for the following components:      Result Value   Potassium 5.2 (*)    BUN 42 (*)    Creatinine, Ser 2.70 (*)    Calcium 8.8 (*)    GFR, Estimated 17 (*)    All other components within normal limits  URINALYSIS, ROUTINE W REFLEX MICROSCOPIC - Abnormal; Notable for the following components:   Ketones, ur 5 (*)    Leukocytes,Ua TRACE (*)    All other components within normal limits  CBC WITH DIFFERENTIAL/PLATELET - Abnormal; Notable for the following components:   RBC 3.09 (*)    Hemoglobin 8.8 (*)    HCT 28.7 (*)    All other components within normal limits  URINALYSIS, MICROSCOPIC (REFLEX) - Abnormal; Notable for the following components:   Bacteria, UA FEW (*)    All other components within normal limits  RESP PANEL BY RT-PCR (FLU A&B, COVID) [id number]  URINE CULTURE    EKG None  Radiology CT Head Wo Contrast  Result Date: 04/13/2021 CLINICAL DATA:  Mental status change, unknown cause EXAM: CT HEAD WITHOUT CONTRAST TECHNIQUE: Contiguous axial images were obtained from the base of the skull through the vertex without intravenous contrast. COMPARISON:   CT head 06/06/2017.  MRI head 09/23/2020. FINDINGS: Brain: No evidence of acute large vascular territory infarction, hemorrhage, hydrocephalus, extra-axial collection or mass lesion/mass effect. Moderate patchy white matter hypoattenuation, nonspecific but compatible with chronic microvascular ischemic disease. Generalized atrophy with ex vacuo ventricular dilation. Vascular: No hyperdense vessel identified. Calcific intracranial atherosclerosis. Skull: No acute fracture.  Hyperostosis frontalis. Sinuses/Orbits: Inferior left maxillary sinus mucosal thickening. No acute orbital findings. Other: No mastoid effusions. IMPRESSION: 1. No evidence of acute intracranial abnormality. 2. Moderate chronic microvascular ischemic disease. Electronically Signed   By: Feliberto Harts M.D.   On: 04/13/2021 18:46   US Renal  Result Date: 04/13/2021 CLINICAL DATA:  Kidney injury EXAM: RENAL / URINARY TRACT ULTRASOUND COMPLETE COMPARISON:  None. FINDINGS: Right Kidney: Renal measurements: 9.7 x 6.8 x 5.9 cm = volume: 205 mL. Right renal cortical thinning. Echotexture grossly normal. Exophytic 3.1 cm right renal cyst. Evaluation is limited due to poor acoustic window. Left Kidney: Renal measurements: 7.6 x 4.4 x 4.2 cm = volume: 72 mL. Normal echotexture. Exophytic cyst upper pole measures up to 4.3 cm. No hydronephrosis or nephrolithiasis. Bladder: Appears normal for degree of bladder distention. Other: None. IMPRESSION: 1. Limited evaluation of the right kidney due to poor acoustic window. 2. Mild right renal cortical thinning. Otherwise normal renal echotexture bilaterally. 3. Bilateral renal cysts. Electronically Signed   By: Sharlet Salina M.D.   On: 04/13/2021 21:05  DG Chest Portable 1 View  Result Date: 04/13/2021 CLINICAL DATA:  Altered level of consciousness EXAM: PORTABLE CHEST 1 VIEW COMPARISON:  01/12/2014 FINDINGS: Single frontal view of the chest demonstrates an unremarkable cardiac silhouette. Chronic  elevation the right hemidiaphragm. No airspace disease, effusion, or pneumothorax. No acute bony abnormalities. IMPRESSION: 1. Stable exam, no acute process. Electronically Signed   By: Sharlet Salina M.D.   On: 04/13/2021 18:31    Procedures Procedures   Medications Ordered in ED Medications  cefTRIAXone (ROCEPHIN) 1 g in sodium chloride 0.9 % 100 mL IVPB (has no administration in time range)  lactated ringers bolus 1,000 mL (1,000 mLs Intravenous New Bag/Given 04/13/21 1919)    ED Course  I have reviewed the triage vital signs and the nursing notes.  Pertinent labs & imaging results that were available during my care of the patient were reviewed by me and considered in my medical decision making (see chart for details).    MDM Rules/Calculators/A&P                           Patient brought in for mental status changes.  Has had some memory issues previously but worse recently.  Now having some hallucinations also.  Has had some falls.  Head CT reassuring.  Creatinine is elevated to 2.7 reviewing records it appears as if it was 1.5 in December.  Potassium also just minimally elevated.  Chest x-ray reassuring.  Urine shows potential infection with white cells and few bacteria.  Culture sent but with mental status change we will treat with Rocephin.  Will require admission the hospital with the encephalopathy and acute kidney injury. Final Clinical Impression(s) / ED Diagnoses Final diagnoses:  Encephalopathy  Urinary tract infection without hematuria, site unspecified  AKI (acute kidney injury) [facility])    Rx / DC Orders ED Discharge Orders     None        Benjiman Core, MD 04/13/21 2136

## 2021-04-13 NOTE — Telephone Encounter (Signed)
I called and LMVM for daughter, Byrd Hesselbach, that sent email, but had not heard back.  LMVM for her to call.  Have opening availability with AL/NP at 04-14-21 at 1030, if that works for them.

## 2021-04-13 NOTE — ED Triage Notes (Signed)
Pt states she has been "having mood changes and doing things I'm not suppose to"-states last night she was talking loud and di not sleep-states she had been taking ambien for sleep x 40 years-pt states she has had changes in mental status since her husband died 2 years ago-daughter lives in the same home and agrees with pt statement-adding that pt has not slept in 48+hours-pt had dementia testing this year and did not receive dementia dx-pt A/O-NAD-to triage in w/c

## 2021-04-14 DIAGNOSIS — Z90711 Acquired absence of uterus with remaining cervical stump: Secondary | ICD-10-CM | POA: Diagnosis not present

## 2021-04-14 DIAGNOSIS — N179 Acute kidney failure, unspecified: Principal | ICD-10-CM

## 2021-04-14 DIAGNOSIS — Z87891 Personal history of nicotine dependence: Secondary | ICD-10-CM | POA: Diagnosis not present

## 2021-04-14 DIAGNOSIS — H9190 Unspecified hearing loss, unspecified ear: Secondary | ICD-10-CM | POA: Diagnosis present

## 2021-04-14 DIAGNOSIS — K219 Gastro-esophageal reflux disease without esophagitis: Secondary | ICD-10-CM | POA: Diagnosis present

## 2021-04-14 DIAGNOSIS — I129 Hypertensive chronic kidney disease with stage 1 through stage 4 chronic kidney disease, or unspecified chronic kidney disease: Secondary | ICD-10-CM | POA: Diagnosis present

## 2021-04-14 DIAGNOSIS — E7800 Pure hypercholesterolemia, unspecified: Secondary | ICD-10-CM | POA: Diagnosis present

## 2021-04-14 DIAGNOSIS — T50915A Adverse effect of multiple unspecified drugs, medicaments and biological substances, initial encounter: Secondary | ICD-10-CM | POA: Diagnosis present

## 2021-04-14 DIAGNOSIS — M479 Spondylosis, unspecified: Secondary | ICD-10-CM | POA: Diagnosis present

## 2021-04-14 DIAGNOSIS — Z96652 Presence of left artificial knee joint: Secondary | ICD-10-CM | POA: Diagnosis present

## 2021-04-14 DIAGNOSIS — N170 Acute kidney failure with tubular necrosis: Secondary | ICD-10-CM | POA: Diagnosis not present

## 2021-04-14 DIAGNOSIS — N1832 Chronic kidney disease, stage 3b: Secondary | ICD-10-CM | POA: Diagnosis present

## 2021-04-14 DIAGNOSIS — F5104 Psychophysiologic insomnia: Secondary | ICD-10-CM | POA: Diagnosis present

## 2021-04-14 DIAGNOSIS — M419 Scoliosis, unspecified: Secondary | ICD-10-CM | POA: Diagnosis present

## 2021-04-14 DIAGNOSIS — Z87442 Personal history of urinary calculi: Secondary | ICD-10-CM | POA: Diagnosis not present

## 2021-04-14 DIAGNOSIS — Z79891 Long term (current) use of opiate analgesic: Secondary | ICD-10-CM | POA: Diagnosis not present

## 2021-04-14 DIAGNOSIS — Z8249 Family history of ischemic heart disease and other diseases of the circulatory system: Secondary | ICD-10-CM | POA: Diagnosis not present

## 2021-04-14 DIAGNOSIS — G9341 Metabolic encephalopathy: Secondary | ICD-10-CM | POA: Insufficient documentation

## 2021-04-14 DIAGNOSIS — I10 Essential (primary) hypertension: Secondary | ICD-10-CM

## 2021-04-14 DIAGNOSIS — F03918 Unspecified dementia, unspecified severity, with other behavioral disturbance: Secondary | ICD-10-CM | POA: Diagnosis present

## 2021-04-14 DIAGNOSIS — M8580 Other specified disorders of bone density and structure, unspecified site: Secondary | ICD-10-CM | POA: Diagnosis present

## 2021-04-14 DIAGNOSIS — R4189 Other symptoms and signs involving cognitive functions and awareness: Secondary | ICD-10-CM

## 2021-04-14 DIAGNOSIS — F32A Depression, unspecified: Secondary | ICD-10-CM | POA: Diagnosis present

## 2021-04-14 DIAGNOSIS — Z20822 Contact with and (suspected) exposure to covid-19: Secondary | ICD-10-CM | POA: Diagnosis present

## 2021-04-14 DIAGNOSIS — E875 Hyperkalemia: Secondary | ICD-10-CM | POA: Diagnosis not present

## 2021-04-14 DIAGNOSIS — N183 Chronic kidney disease, stage 3 unspecified: Secondary | ICD-10-CM

## 2021-04-14 DIAGNOSIS — Z808 Family history of malignant neoplasm of other organs or systems: Secondary | ICD-10-CM | POA: Diagnosis not present

## 2021-04-14 LAB — CBC WITH DIFFERENTIAL/PLATELET
Abs Immature Granulocytes: 0.02 10*3/uL (ref 0.00–0.07)
Basophils Absolute: 0 10*3/uL (ref 0.0–0.1)
Basophils Relative: 1 %
Eosinophils Absolute: 0.2 10*3/uL (ref 0.0–0.5)
Eosinophils Relative: 4 %
HCT: 29.7 % — ABNORMAL LOW (ref 36.0–46.0)
Hemoglobin: 9.2 g/dL — ABNORMAL LOW (ref 12.0–15.0)
Immature Granulocytes: 1 %
Lymphocytes Relative: 27 %
Lymphs Abs: 1 10*3/uL (ref 0.7–4.0)
MCH: 28.4 pg (ref 26.0–34.0)
MCHC: 31 g/dL (ref 30.0–36.0)
MCV: 91.7 fL (ref 80.0–100.0)
Monocytes Absolute: 0.6 10*3/uL (ref 0.1–1.0)
Monocytes Relative: 14 %
Neutro Abs: 2.1 10*3/uL (ref 1.7–7.7)
Neutrophils Relative %: 53 %
Platelets: 159 10*3/uL (ref 150–400)
RBC: 3.24 MIL/uL — ABNORMAL LOW (ref 3.87–5.11)
RDW: 15.2 % (ref 11.5–15.5)
WBC: 3.9 10*3/uL — ABNORMAL LOW (ref 4.0–10.5)
nRBC: 0 % (ref 0.0–0.2)

## 2021-04-14 LAB — COMPREHENSIVE METABOLIC PANEL
ALT: 19 U/L (ref 0–44)
AST: 22 U/L (ref 15–41)
Albumin: 3.1 g/dL — ABNORMAL LOW (ref 3.5–5.0)
Alkaline Phosphatase: 73 U/L (ref 38–126)
Anion gap: 6 (ref 5–15)
BUN: 38 mg/dL — ABNORMAL HIGH (ref 8–23)
CO2: 23 mmol/L (ref 22–32)
Calcium: 8.9 mg/dL (ref 8.9–10.3)
Chloride: 110 mmol/L (ref 98–111)
Creatinine, Ser: 2.35 mg/dL — ABNORMAL HIGH (ref 0.44–1.00)
GFR, Estimated: 20 mL/min — ABNORMAL LOW (ref >60)
Glucose, Bld: 86 mg/dL (ref 70–99)
Potassium: 4.9 mmol/L (ref 3.5–5.1)
Sodium: 139 mmol/L (ref 135–145)
Total Bilirubin: 0.5 mg/dL (ref 0.3–1.2)
Total Protein: 5.6 g/dL — ABNORMAL LOW (ref 6.5–8.1)

## 2021-04-14 LAB — MAGNESIUM: Magnesium: 2.2 mg/dL (ref 1.7–2.4)

## 2021-04-14 MED ORDER — MEMANTINE HCL 10 MG PO TABS: 5.0000 mg | ORAL_TABLET | Freq: Two times a day (BID) | ORAL | Status: DC

## 2021-04-14 MED ORDER — ONDANSETRON HCL 4 MG/2ML IJ SOLN
4.0000 mg | Freq: Four times a day (QID) | INTRAMUSCULAR | Status: DC | PRN
  Administered 2021-04-17: 4 mg via INTRAVENOUS
  Filled 2021-04-14: qty 2

## 2021-04-14 MED ORDER — ACETAMINOPHEN 325 MG PO TABS
650.0000 mg | ORAL_TABLET | Freq: Four times a day (QID) | ORAL | Status: DC | PRN
  Administered 2021-04-14 – 2021-04-17 (×8): 650 mg via ORAL
  Filled 2021-04-14 (×8): qty 2

## 2021-04-14 MED ORDER — ACETAMINOPHEN 650 MG RE SUPP: 650.0000 mg | Freq: Four times a day (QID) | RECTAL | Status: DC | PRN

## 2021-04-14 MED ORDER — LACTATED RINGERS IV SOLN: INTRAVENOUS | Status: DC

## 2021-04-14 MED ORDER — HEPARIN SODIUM (PORCINE) 5000 UNIT/ML IJ SOLN
5000.0000 [IU] | Freq: Three times a day (TID) | INTRAMUSCULAR | Status: DC
  Administered 2021-04-14 – 2021-04-17 (×9): 5000 [IU] via SUBCUTANEOUS
  Filled 2021-04-14 (×9): qty 1

## 2021-04-14 MED ORDER — ONDANSETRON HCL 4 MG PO TABS
4.0000 mg | ORAL_TABLET | Freq: Four times a day (QID) | ORAL | Status: DC | PRN
  Administered 2021-04-16 (×2): 4 mg via ORAL
  Filled 2021-04-14 (×2): qty 1

## 2021-04-14 MED ORDER — SODIUM CHLORIDE 0.9 % IV SOLN
1.0000 g | INTRAVENOUS | Status: DC
  Administered 2021-04-14: 1 g via INTRAVENOUS
  Filled 2021-04-14: qty 10

## 2021-04-14 MED ORDER — ESCITALOPRAM OXALATE 10 MG PO TABS
10.0000 mg | ORAL_TABLET | Freq: Every day | ORAL | Status: DC
  Administered 2021-04-14 – 2021-04-17 (×4): 10 mg via ORAL
  Filled 2021-04-14 (×4): qty 1

## 2021-04-14 NOTE — Subjective & Objective (Signed)
CC: mood changes HPI: 87 year old African-American female with a history of hypertension, CKD stage III with a baseline creatinine of 1.3 who was brought to the ER on 04/13/2021 due to "having mood changes"  Patient reportedly started having mental status changes when her husband died approximately 2 years ago.  Patient evaluated by her PCP this year was found to have cognitive impairment.  She was started on Namenda.  Work-up in the ER showed acute renal failure with a creatinine of 2.7.  Baseline is approximate 1.3.  Patient started on IV fluids.  Patient transferred to [facility] for further evaluation.  Patient unable to give any history or review of systems.  She is currently sleeping very soundly at 3:00 in the morning.

## 2021-04-14 NOTE — Assessment & Plan Note (Addendum)
-   stable; no sxms

## 2021-04-14 NOTE — Progress Notes (Signed)
Pt received from [facility] via care link. Pt AO x 4 at the time of arrival. CHG bath completed. Connected to tele and ccmd notified. Pt refused to take her pants off and stated she wants to be left alone. Warm blanket provided. Oriented to room and call bell system. Personal belonging taken home by daughter per report. Call bell within reach, bed alarm on. Will continue to monitor.

## 2021-04-14 NOTE — Assessment & Plan Note (Addendum)
-  patient has history of CKD3b. Baseline creat ~ 1.4, eGFR 35 - patient presents with increase in creat >0.3 mg/dL above baseline, creat increase >1.5x baseline presumed to have occurred within past 7 days PTA -BUN 42 and creatinine 2.7 at time of admission - has improved with hydration  -Creatinine has returned to baseline at time of discharge, 1.41 and BUN down to 14

## 2021-04-14 NOTE — Assessment & Plan Note (Addendum)
-   chronically on ambien and remeron; these are felt to be contributing to her encephalopathy  -Ambien discontinued at discharge.  Patient's daughter informed to discuss tapering off of Remeron with PCP at discharge -Informed daughter, okay to try melatonin for sleep

## 2021-04-14 NOTE — H&P (Signed)
History and Physical    Nancy Suarez LOV:[id number] DOB: 02/15/1932 DOA: 04/13/2021  PCP: Corine Shelter, MD   Patient coming from: Home  I have personally briefly reviewed patient's old medical records in  Link  CC: mood changes HPI: 87 year old African-American female with a history of hypertension, CKD stage III with a baseline creatinine of 1.3 who was brought to the ER on 04/13/2021 due to "having mood changes"  Patient reportedly started having mental status changes when her husband died approximately 2 years ago.  Patient evaluated by her PCP this year was found to have cognitive impairment.  She was started on Namenda.  Work-up in the ER showed acute renal failure with a creatinine of 2.7.  Baseline is approximate 1.3.  Patient started on IV fluids.  Patient transferred to [facility] for further evaluation.  Patient unable to give any history or review of systems.  She is currently sleeping very soundly at 3:00 in the morning.   ED Course: pt seen in ER. Noted to have ARF. Started on IVF.  Review of Systems:  Review of Systems  Unable to perform ROS: Other pt sleeping soundly  Past Medical History:  Diagnosis Date   ASCUS (atypical squamous cells of undetermined significance) on Pap smear    Atrophic vaginitis    Chronic insomnia    Depression    GERD (gastroesophageal reflux disease)    Goiter    History of kidney stones    Hypercholesteremia    Hypertension    LGSIL (low grade squamous intraepithelial dysplasia)    Osteopenia    Ovarian cyst    "shrank it"   Pancreatitis    Scoliosis    Spinal arthritis     Past Surgical History:  Procedure Laterality Date   ABDOMINAL HYSTERECTOMY  1977   partial   KIDNEY STONE SURGERY  1990's   2-3 stones x1 surgery   ORIF FEMUR FRACTURE Left 06/09/2017   Procedure: OPEN REDUCTION INTERNAL FIXATION (ORIF) DISTAL FEMUR FRACTURE;  Surgeon: Durene Romans, MD;  Location: [facility];  Service:  Orthopedics;  Laterality: Left;   TONSILLECTOMY  as teenager   TOTAL KNEE ARTHROPLASTY Left 01/19/2014   Procedure: LEFT TOTAL KNEE ARTHROPLASTY;  Surgeon: Shelda Pal, MD;  Location: [facility];  Service: Orthopedics;  Laterality: Left;   WISDOM TOOTH EXTRACTION       reports that she quit smoking about 42 years ago. Her smoking use included cigarettes. She has quit using smokeless tobacco. She reports current alcohol use. She reports that she does not use drugs.  Allergies  Allergen Reactions   Aspirin     Upsets stomach if 325mg    Codeine Nausea And Vomiting   Sulfa Antibiotics Nausea Only    Family History  Problem Relation Age of Onset   Hypertension Father    Cancer Father    Cancer Mother        Bone   Cancer Brother     Prior to Admission medications   Medication Sig Start Date End Date Taking? Authorizing Provider  acetaminophen (TYLENOL) 325 MG tablet Take 2 tablets (650 mg total) by mouth every 4 (four) hours as needed for mild pain ((score 1 to 3) or temp > 100.5). 06/12/17   14/11/18, MD  atorvastatin (LIPITOR) 20 MG tablet Take 20 mg by mouth every evening.  03/29/17   [provider]  calcium-vitamin D (OSCAL WITH D) 500-200 MG-UNIT per tablet Take 1 tablet by mouth 2 (two)  times daily.    [provider]  carboxymethylcellulose (REFRESH PLUS) 0.5 % SOLN Place 1 drop into both eyes as needed (Dry eyes).    [provider]  cetirizine (ZYRTEC) 10 MG tablet Take 10 mg by mouth as needed for allergies.    [provider]  docusate sodium 100 MG CAPS Take 100 mg by mouth 2 (two) times daily. 01/21/14   Lanney Gins, PA-C  escitalopram (LEXAPRO) 10 MG tablet Take 10 mg by mouth daily.    [provider]  famotidine (PEPCID) 20 MG tablet famotidine 20 mg tablet  TAKE 1 TABLET BY MOUTH TWICE DAILY    [provider]  gabapentin (NEURONTIN) 300 MG capsule Take 300 mg by mouth every 6 (six) hours. 03/04/17   [provider]  HYDROcodone-acetaminophen (NORCO/VICODIN) 5-325 MG tablet Take 1-2 tablets by mouth every 6 (six) hours as needed for moderate pain ((score 4 to 6)). 06/11/17   Durene Romans, MD  losartan-hydrochlorothiazide (HYZAAR) 100-25 MG tablet losartan 100 mg-hydrochlorothiazide 25 mg tablet  TAKE 1 TABLET BY MOUTH EVERY DAY    [provider]  memantine (NAMENDA) 5 MG tablet Take 1 tablet (5 mg total) by mouth 2 (two) times daily. Follow titration instructions provided separately in writing. 03/01/21   Huston Foley, MD  mirtazapine (REMERON) 15 MG tablet Take 15 mg by mouth at bedtime.    [provider]  Multiple Vitamin (MULTIVITAMIN WITH MINERALS) TABS tablet Take 1 tablet by mouth daily.    [provider]  zolpidem (AMBIEN) 5 MG tablet Take 2.5 mg by mouth at bedtime as needed for sleep.    [provider]    Physical Exam: Vitals:   04/13/21 2200 04/13/21 2330 04/14/21 0200 04/14/21 0250  BP: (!) 161/68 (!) 156/65 (!) 137/92 (!) 160/67  Pulse: 68 66 63 70  Resp: 16 17 17 15   Temp:  98.3 F (36.8 C) 98.3 F (36.8 C) 98.1 F (36.7 C)  TempSrc:  Oral Oral Oral  SpO2: 100% 100% 96% 100%  Weight:    71.3 kg  Height:    5\' 5"  (1.651 m)    Physical Exam Vitals and nursing note reviewed.  Constitutional:      General: She is not in acute distress.    Appearance: She is not ill-appearing, toxic-appearing or diaphoretic.     Comments: Sleeping soundly  HENT:     Head: Normocephalic and atraumatic.  Cardiovascular:     Rate and Rhythm: Normal rate and regular rhythm.     Pulses: Normal pulses.  Pulmonary:     Effort: Pulmonary effort is normal.  Abdominal:     General: Abdomen is flat. Bowel sounds are normal. There is no distension.     Tenderness: There is no abdominal tenderness. There is no guarding or rebound.  Musculoskeletal:     Comments: Charcot joint left ankle  Skin:    General: Skin is warm and dry.     Labs on  Admission: I have personally reviewed following labs and imaging studies  CBC: Recent Labs  Lab 04/13/21 1730  WBC 4.6  NEUTROABS 2.8  HGB 8.8*  HCT 28.7*  MCV 92.9  PLT 177   Basic Metabolic Panel: Recent Labs  Lab 04/13/21 1730  NA 140  K 5.2*  CL 108  CO2 24  GLUCOSE 85  BUN 42*  CREATININE 2.70*  CALCIUM 8.8*   GFR: Estimated Creatinine Clearance: 14.5 mL/min (A) (by C-G formula based on SCr  of 2.7 mg/dL (H)). Liver Function Tests: Recent Labs  Lab 04/13/21 1730  AST 16  ALT 15  ALKPHOS 74  BILITOT 0.3  PROT 6.7  ALBUMIN 3.8   No results for input(s): LIPASE, AMYLASE in the last 168 hours. No results for input(s): AMMONIA in the last 168 hours. Coagulation Profile: No results for input(s): INR, PROTIME in the last 168 hours. Cardiac Enzymes: No results for input(s): CKTOTAL, CKMB, CKMBINDEX, TROPONINI in the last 168 hours. BNP (last 3 results) No results for input(s): PROBNP in the last 8760 hours. HbA1C: No results for input(s): HGBA1C in the last 72 hours. CBG: No results for input(s): GLUCAP in the last 168 hours. Lipid Profile: No results for input(s): CHOL, HDL, LDLCALC, TRIG, CHOLHDL, LDLDIRECT in the last 72 hours. Thyroid Function Tests: No results for input(s): TSH, T4TOTAL, FREET4, T3FREE, THYROIDAB in the last 72 hours. Anemia Panel: No results for input(s): VITAMINB12, FOLATE, FERRITIN, TIBC, IRON, RETICCTPCT in the last 72 hours. Urine analysis:    Component Value Date/Time   COLORURINE YELLOW 04/13/2021 1855   APPEARANCEUR CLEAR 04/13/2021 1855   LABSPEC 1.020 04/13/2021 1855   PHURINE 5.5 04/13/2021 1855   GLUCOSEU NEGATIVE 04/13/2021 1855   HGBUR NEGATIVE 04/13/2021 1855   BILIRUBINUR NEGATIVE 04/13/2021 1855   KETONESUR 5 (A) 04/13/2021 1855   PROTEINUR NEGATIVE 04/13/2021 1855   UROBILINOGEN 0.2 01/12/2014 1537   NITRITE NEGATIVE 04/13/2021 1855   LEUKOCYTESUR TRACE (A) 04/13/2021 1855    Radiological Exams on Admission:  I have personally reviewed images CT Head Wo Contrast  Result Date: 04/13/2021 CLINICAL DATA:  Mental status change, unknown cause EXAM: CT HEAD WITHOUT CONTRAST TECHNIQUE: Contiguous axial images were obtained from the base of the skull through the vertex without intravenous contrast. COMPARISON:  CT head 06/06/2017.  MRI head 09/23/2020. FINDINGS: Brain: No evidence of acute large vascular territory infarction, hemorrhage, hydrocephalus, extra-axial collection or mass lesion/mass effect. Moderate patchy white matter hypoattenuation, nonspecific but compatible with chronic microvascular ischemic disease. Generalized atrophy with ex vacuo ventricular dilation. Vascular: No hyperdense vessel identified. Calcific intracranial atherosclerosis. Skull: No acute fracture.  Hyperostosis frontalis. Sinuses/Orbits: Inferior left maxillary sinus mucosal thickening. No acute orbital findings. Other: No mastoid effusions. IMPRESSION: 1. No evidence of acute intracranial abnormality. 2. Moderate chronic microvascular ischemic disease. Electronically Signed   By: Feliberto Harts M.D.   On: 04/13/2021 18:46   US Renal  Result Date: 04/13/2021 CLINICAL DATA:  Kidney injury EXAM: RENAL / URINARY TRACT ULTRASOUND COMPLETE COMPARISON:  None. FINDINGS: Right Kidney: Renal measurements: 9.7 x 6.8 x 5.9 cm = volume: 205 mL. Right renal cortical thinning. Echotexture grossly normal. Exophytic 3.1 cm right renal cyst. Evaluation is limited due to poor acoustic window. Left Kidney: Renal measurements: 7.6 x 4.4 x 4.2 cm = volume: 72 mL. Normal echotexture. Exophytic cyst upper pole measures up to 4.3 cm. No hydronephrosis or nephrolithiasis. Bladder: Appears normal for degree of bladder distention. Other: None. IMPRESSION: 1. Limited evaluation of the right kidney due to poor acoustic window. 2. Mild right renal cortical thinning. Otherwise normal renal echotexture bilaterally. 3. Bilateral renal cysts. Electronically Signed    By: Sharlet Salina M.D.   On: 04/13/2021 21:05   DG Chest Portable 1 View  Result Date: 04/13/2021 CLINICAL DATA:  Altered level of consciousness EXAM: PORTABLE CHEST 1 VIEW COMPARISON:  01/12/2014 FINDINGS: Single frontal view of the chest demonstrates an unremarkable cardiac silhouette. Chronic elevation the right hemidiaphragm. No airspace disease, effusion, or pneumothorax. No acute  bony abnormalities. IMPRESSION: 1. Stable exam, no acute process. Electronically Signed   By: Sharlet Salina M.D.   On: 04/13/2021 18:31    EKG: I have personally reviewed EKG: no EKG  Assessment/Plan Principal Problem:   Acute renal failure superimposed on stage 3 chronic kidney disease (HCC) Active Problems:   Cognitive impairment   Chronic insomnia   GERD (gastroesophageal reflux disease)   Essential hypertension   HLD (hyperlipidemia)    Acute renal failure superimposed on stage 3 chronic kidney disease (HCC) Admit to med observation bed. Continue with IVF. Avoid nephrotoxic agents. Baseline Scr 1.3. check renal U/S. UA is bland without proteinuria.  UA is not impressive for UTI. Awaiting urine culture.  Cognitive impairment PCP thought pt may have cognitive impairment and started pt on Namenda. Will need to ARF to resolved prior to reassessment.  Also with large amounts of insomnia meds she is taking, this could also effect her memory. Hold insomnia meds. Pt is sleeping peacefully during exam.   Chronic insomnia Has been on ambien for several years. Also on other meds to aid is sleeping. This polypharmacy could effect her memory and cognition.  Pt sleeping peacefully at present. Hold these meds.  GERD (gastroesophageal reflux disease) stable  Essential hypertension Stable. Holding ARB/HCTZ due to AKI.  HLD (hyperlipidemia) Continue lipitor.  DVT prophylaxis: SQ Heparin Code Status: Full Code by default Family Communication: no family at bedside  Disposition Plan: return home  Consults  called: none  Admission status: Observation, Med-Surg   Carollee Herter, DO [facility] 04/14/2021, 3:38 AM

## 2021-04-14 NOTE — Assessment & Plan Note (Signed)
Continue lipitor  ?

## 2021-04-14 NOTE — Progress Notes (Signed)
  INTERVAL PROGRESS NOTE    KYNZLEE HUCKER- 87 y.o. female  LOS: 0 __________________________________________________________________  SUBJECTIVE: Admitted 04/13/2021 with cc of  Chief Complaint  Patient presents with   Medical Clearance  Patient sleeping peacefully on evaluation and awakens with opening of shades in room Since admission, remains stable  OBJECTIVE: Blood pressure (!) 146/54, pulse 67, temperature 97.8 F (36.6 C), temperature source Oral, resp. rate 16, height 5\' 5"  (1.651 m), weight 71.3 kg, SpO2 100 %.  General: NAD, sleeping Cardiac: RRR, normal heart sounds, no murmurs. 2+ radial and PT pulses bilaterally Respiratory: normal respiratory effort Extremities: trace edema. WWP. Skin: warm and dry, no rashes noted  ASSESSMENT/PLAN:  I have reviewed the full H&P by Dr. , and I agree with the assessment and plan as outlined therein. In addition: Urine culture pending.  Cr improving 2.7>2.3, continue IV fluid hydration Continue to monitor for improvement and hold medications that can cause sedation or cognitive impairment. Blinds open and OOB as tolerated during day.  Principal Problem:   Acute renal failure superimposed on stage 3 chronic kidney disease (HCC) Active Problems:   Chronic insomnia   GERD (gastroesophageal reflux disease)   Essential hypertension   HLD (hyperlipidemia)   Cognitive impairment   Imogene Burn, DO [facility] 04/14/2021, 12:26 PM    [email or web address] Available by Epic secure chat 7AM-7PM. If 7PM-7AM, please contact night-coverage   No Charge

## 2021-04-14 NOTE — Assessment & Plan Note (Addendum)
-   differential includes polypharmacy/over-sedation vs underlying cognitive impairment  -Continue holding sedating medications and monitoring mentation response -Currently she is alert and oriented x3 - remains AOx3 prior to discharge; high suspicion that her symptoms were polypharmacy related and worsened by some possible uremia - discussed with her daughter prior to discharge: d/c ambien, cut back on percocet and discuss with PCP about tapering off remeron.

## 2021-04-14 NOTE — Assessment & Plan Note (Addendum)
-   Home meds initially on hold due to AKI - Blood pressure starting to uptrend -Renal function back to baseline, will resume home meds

## 2021-04-14 NOTE — Progress Notes (Signed)
64: [facility] admit consult paged for pt's arrival.  0310: [facility] MD by bedside.  Will continue to monitor.

## 2021-04-14 NOTE — Plan of Care (Signed)

## 2021-04-14 NOTE — Progress Notes (Signed)
Mobility Specialist Progress Note:   04/14/21 1240  Therapy Vitals  Pulse Rate 71  Mobility  Activity Ambulated in hall  Level of Assistance Contact guard assist, steadying assist  Assistive Device Front wheel walker  Distance Ambulated (ft) 110 ft  Mobility Ambulated with assistance in hallway  Mobility Response Tolerated well  Mobility performed by Mobility specialist  $Mobility charge 1 Mobility   Pre Mobility: HR 71 bpm During Mobility: HR 101 bpm Post Mobility: HR 88 bpm  Pt received trying to go to the BR. Ambulated to BR then in hallway 110' with RW and contact G for stability. Distance limtited d/t fatigue. Pt left sitting EOB eating lunch.   Addison Lank Mobility Specialist  Phone [phone]

## 2021-04-15 DIAGNOSIS — N170 Acute kidney failure with tubular necrosis: Secondary | ICD-10-CM

## 2021-04-15 LAB — CBC
HCT: 26.4 % — ABNORMAL LOW (ref 36.0–46.0)
Hemoglobin: 8.2 g/dL — ABNORMAL LOW (ref 12.0–15.0)
MCH: 28.5 pg (ref 26.0–34.0)
MCHC: 31.1 g/dL (ref 30.0–36.0)
MCV: 91.7 fL (ref 80.0–100.0)
Platelets: 157 10*3/uL (ref 150–400)
RBC: 2.88 MIL/uL — ABNORMAL LOW (ref 3.87–5.11)
RDW: 14.8 % (ref 11.5–15.5)
WBC: 3.5 10*3/uL — ABNORMAL LOW (ref 4.0–10.5)
nRBC: 0 % (ref 0.0–0.2)

## 2021-04-15 LAB — BASIC METABOLIC PANEL
Anion gap: 5 (ref 5–15)
BUN: 28 mg/dL — ABNORMAL HIGH (ref 8–23)
CO2: 24 mmol/L (ref 22–32)
Calcium: 8.8 mg/dL — ABNORMAL LOW (ref 8.9–10.3)
Chloride: 109 mmol/L (ref 98–111)
Creatinine, Ser: 1.85 mg/dL — ABNORMAL HIGH (ref 0.44–1.00)
GFR, Estimated: 26 mL/min — ABNORMAL LOW (ref >60)
Glucose, Bld: 106 mg/dL — ABNORMAL HIGH (ref 70–99)
Potassium: 5.5 mmol/L — ABNORMAL HIGH (ref 3.5–5.1)
Sodium: 138 mmol/L (ref 135–145)

## 2021-04-15 LAB — URINE CULTURE

## 2021-04-15 MED ORDER — DEXTROSE 50 % IV SOLN
1.0000 | Freq: Once | INTRAVENOUS | Status: AC
  Administered 2021-04-15: 50 mL via INTRAVENOUS
  Filled 2021-04-15: qty 50

## 2021-04-15 MED ORDER — INSULIN ASPART 100 UNIT/ML IV SOLN
5.0000 [IU] | Freq: Once | INTRAVENOUS | Status: AC
  Administered 2021-04-15: 5 [IU] via INTRAVENOUS

## 2021-04-15 MED ORDER — ALBUTEROL SULFATE (2.5 MG/3ML) 0.083% IN NEBU
2.5000 mg | INHALATION_SOLUTION | Freq: Once | RESPIRATORY_TRACT | Status: AC
Start: 2021-04-15 — End: 2021-04-15
  Administered 2021-04-15: 2.5 mg via RESPIRATORY_TRACT
  Filled 2021-04-15: qty 3

## 2021-04-15 MED ORDER — MELATONIN 3 MG PO TABS
3.0000 mg | ORAL_TABLET | Freq: Every day | ORAL | Status: DC
  Administered 2021-04-15 – 2021-04-16 (×2): 3 mg via ORAL
  Filled 2021-04-15 (×2): qty 1

## 2021-04-15 MED ORDER — SODIUM CHLORIDE 0.9 % IV SOLN: INTRAVENOUS | Status: DC

## 2021-04-15 NOTE — Progress Notes (Signed)
Mobility Specialist Progress Note:   04/15/21 1025  Therapy Vitals  Pulse Rate 74  Mobility  Activity Ambulated in hall  Level of Assistance Contact guard assist, steadying assist  Assistive Device Front wheel walker  Distance Ambulated (ft) 120 ft  Mobility Ambulated with assistance in hallway  Mobility Response Tolerated well  Mobility performed by Mobility specialist  $Mobility charge 1 Mobility   During Mobility: HR 99 bpm Post Mobility: HR 74 bpm; BP 159/68  Pt received trying to get OOB. Ambulated in hallway 120' with contact G and RW. Has LOBs when hands come off the RW. Pt stated she felt her heart racing, HR peaked at 99 bpm during ambulation. Left sitting EOB with bed alarm on.   Addison Lank Mobility Specialist  Phone [phone]

## 2021-04-15 NOTE — Progress Notes (Signed)
PROGRESS NOTE  Nancy Suarez    DOB: 1932/02/23, 87 y.o.  BJS:[id number]  PCP: Corine Shelter, MD   Code Status: Full Code   DOA: 04/13/2021   LOS: 1  Brief Narrative of Current Hospitalization  Nancy Suarez is a 87 y.o. female with a PMH significant for HTN, CKD 3, insomnia, dementia. They presented from home to the ED on 04/13/2021 with AMS x a few days. In the ED, it was found that they had AMS, AKI. They were treated with IV hydration and discontinuation of CNS effecting medications. Head CT was negative for acute changes.  Patient was admitted to medicine service for further workup and management of AMS as outlined in detail below.  04/15/21 -improved, baseline cognition  Assessment & Plan  Principal Problem:   Acute renal failure superimposed on stage 3 chronic kidney disease (HCC) Active Problems:   Chronic insomnia   GERD (gastroesophageal reflux disease)   Essential hypertension   HLD (hyperlipidemia)   Cognitive impairment  Acute renal failure superimposed on stage 3 chronic kidney disease (HCC) Admit to med observation bed. Continue with IVF. Avoid nephrotoxic agents. Baseline Scr 1.3. check renal U/S. UA is bland without proteinuria.  UA is not impressive for UTI. Awaiting urine culture. - continue IV fluids and change to NS  Hyperkalemia- K+ 5.5 today - ECG - albuterol - insulin - BMP am   Cognitive impairment PCP thought pt may have cognitive impairment and started pt on Namenda. Will need to ARF to resolved prior to reassessment.  Also with large amounts of insomnia meds she is taking, this could also effect her memory. Discontinued all CNS effecting medications and discontinue at discharge, daughter is in agreement with this plan. Had extended discussion about implementing good sleep hygiene at home and limiting sedating medications. - discontinue insomnia meds. - melatonin nightly - delirium precautions.    GERD (gastroesophageal reflux  disease) stable   Essential hypertension-  elevated while holding medications.  Stable. - continue Holding ARB/HCTZ due to AKI.   HLD (hyperlipidemia) Continue lipitor.  DVT prophylaxis: heparin injection 5,000 Units Start: 04/14/21 0600 SCDs Start: 04/14/21 0316   Diet:  Diet Orders (From admission, onward)     Start     Ordered   04/14/21 1016  Diet heart healthy/carb modified Room service appropriate? Yes with Assist; Fluid consistency: Thin  Diet effective now       Question Answer Comment  Diet-HS Snack? Nothing   Room service appropriate? Yes with Assist   Fluid consistency: Thin      04/14/21 1015            Subjective 04/15/21    Pt reports feeling much better.   Disposition Plan & Communication  Status is: Inpatient  Remains inpatient appropriate because: Severity of illness requires inpatient management   Family Communication: spoke with daughter on phone.   Consults, Procedures, Significant Events  Consultants:  nephrology  Procedures/significant events:  none  Antimicrobials:  Anti-infectives (From admission, onward)    Start     Dose/Rate Route Frequency Ordered Stop   04/14/21 2000  cefTRIAXone (ROCEPHIN) 1 g in sodium chloride 0.9 % 100 mL IVPB        1 g 200 mL/hr over 30 Minutes Intravenous Every 24 hours 04/14/21 0318 04/17/21 1959   04/13/21 2130  cefTRIAXone (ROCEPHIN) 1 g in sodium chloride 0.9 % 100 mL IVPB        1 g 200 mL/hr over 30 Minutes Intravenous  Once  04/13/21 2129 04/13/21 2229        Objective   Vitals:   04/14/21 2354 04/15/21 0425 04/15/21 0500 04/15/21 0736  BP: (!) 144/62 (!) 143/64  (!) 186/78  Pulse: 68 60  62  Resp: 18 15  15   Temp: 97.8 F (36.6 C) 98.4 F (36.9 C)  98.5 F (36.9 C)  TempSrc: Oral Oral  Oral  SpO2: 100% 100%  100%  Weight:   71.3 kg   Height:        Intake/Output Summary (Last 24 hours) at 04/15/2021 0750 Last data filed at 04/15/2021 0647 Gross per 24 hour  Intake 1250 ml   Output 400 ml  Net 850 ml   Filed Weights   04/13/21 1722 04/14/21 0250 04/15/21 0500  Weight: 61.2 kg 71.3 kg 71.3 kg    Patient BMI: Body mass index is 26.16 kg/m.   Physical Exam: General: awake, alert, NAD HEENT: atraumatic, clear conjunctiva, anicteric sclera, moist mucus membranes, hard of hearing Respiratory: CTAB, no wheezes, rales or rhonchi, normal respiratory effort. Cardiovascular: normal S1/S2,  RRR, no JVD, murmurs, rubs, gallops, quick capillary refill  Gastrointestinal: soft, NT, ND, no HSM felt Nervous: A&O x3. no gross focal neurologic deficits, normal speech Extremities: moves all equally, no edema, normal tone Skin: dry, intact, normal temperature, normal color, No rashes, lesions or ulcers Psychiatry: tearful mood and affect  Labs   I have personally reviewed following labs and imaging studies Admission on 04/13/2021  Component Date Value Ref Range Status   Sodium 04/13/2021 140  135 - 145 mmol/L Final   Potassium 04/13/2021 5.2 (A) 3.5 - 5.1 mmol/L Final   Chloride 04/13/2021 108  98 - 111 mmol/L Final   CO2 04/13/2021 24  22 - 32 mmol/L Final   Glucose, Bld 04/13/2021 85  70 - 99 mg/dL Final   BUN 06/13/2021 42 (A) 8 - 23 mg/dL Final   Creatinine, Ser 04/13/2021 2.70 (A) 0.44 - 1.00 mg/dL Final   Calcium 06/13/2021 8.8 (A) 8.9 - 10.3 mg/dL Final   Total Protein 54/03/8118 6.7  6.5 - 8.1 g/dL Final   Albumin 14/78/2956 3.8  3.5 - 5.0 g/dL Final   AST 21/30/8657 16  15 - 41 U/L Final   ALT 04/13/2021 15  0 - 44 U/L Final   Alkaline Phosphatase 04/13/2021 74  38 - 126 U/L Final   Total Bilirubin 04/13/2021 0.3  0.3 - 1.2 mg/dL Final   GFR, Estimated 04/13/2021 17 (A) >60 mL/min Final   Anion gap 04/13/2021 8  5 - 15 Final   Color, Urine 04/13/2021 YELLOW  YELLOW Final   APPearance 04/13/2021 CLEAR  CLEAR Final   Specific Gravity, Urine 04/13/2021 1.020  1.005 - 1.030 Final   pH 04/13/2021 5.5  5.0 - 8.0 Final   Glucose, UA 04/13/2021 NEGATIVE   NEGATIVE mg/dL Final   Hgb urine dipstick 04/13/2021 NEGATIVE  NEGATIVE Final   Bilirubin Urine 04/13/2021 NEGATIVE  NEGATIVE Final   Ketones, ur 04/13/2021 5 (A) NEGATIVE mg/dL Final   Protein, ur 06/13/2021 NEGATIVE  NEGATIVE mg/dL Final   Nitrite 28/41/3244 NEGATIVE  NEGATIVE Final   Leukocytes,Ua 04/13/2021 TRACE (A) NEGATIVE Final   WBC 04/13/2021 4.6  4.0 - 10.5 K/uL Final   RBC 04/13/2021 3.09 (A) 3.87 - 5.11 MIL/uL Final   Hemoglobin 04/13/2021 8.8 (A) 12.0 - 15.0 g/dL Final   HCT 06/13/2021 28.7 (A) 36.0 - 46.0 % Final   MCV 04/13/2021 92.9  80.0 - 100.0 fL Final  MCH 04/13/2021 28.5  26.0 - 34.0 pg Final   MCHC 04/13/2021 30.7  30.0 - 36.0 g/dL Final   RDW 96/22/2979 15.4  11.5 - 15.5 % Final   Platelets 04/13/2021 177  150 - 400 K/uL Final   nRBC 04/13/2021 0.0  0.0 - 0.2 % Final   Neutrophils Relative % 04/13/2021 61  % Final   Neutro Abs 04/13/2021 2.8  1.7 - 7.7 K/uL Final   Lymphocytes Relative 04/13/2021 22  % Final   Lymphs Abs 04/13/2021 1.0  0.7 - 4.0 K/uL Final   Monocytes Relative 04/13/2021 14  % Final   Monocytes Absolute 04/13/2021 0.7  0.1 - 1.0 K/uL Final   Eosinophils Relative 04/13/2021 2  % Final   Eosinophils Absolute 04/13/2021 0.1  0.0 - 0.5 K/uL Final   Basophils Relative 04/13/2021 1  % Final   Basophils Absolute 04/13/2021 0.0  0.0 - 0.1 K/uL Final   Immature Granulocytes 04/13/2021 0  % Final   Abs Immature Granulocytes 04/13/2021 0.02  0.00 - 0.07 K/uL Final   SARS Coronavirus 2 by RT PCR 04/13/2021 NEGATIVE  NEGATIVE Final   Influenza A by PCR 04/13/2021 NEGATIVE  NEGATIVE Final   Influenza B by PCR 04/13/2021 NEGATIVE  NEGATIVE Final   RBC / HPF 04/13/2021 0-5  0 - 5 RBC/hpf Final   WBC, UA 04/13/2021 6-10  0 - 5 WBC/hpf Final   Bacteria, UA 04/13/2021 FEW (A) NONE SEEN Final   Squamous Epithelial / LPF 04/13/2021 0-5  0 - 5 Final   Mucus 04/13/2021 PRESENT   Final   Granular Casts, UA 04/13/2021 PRESENT   Final   WBC 04/14/2021 3.9 (A)  4.0 - 10.5 K/uL Final   RBC 04/14/2021 3.24 (A) 3.87 - 5.11 MIL/uL Final   Hemoglobin 04/14/2021 9.2 (A) 12.0 - 15.0 g/dL Final   HCT 89/21/1941 29.7 (A) 36.0 - 46.0 % Final   MCV 04/14/2021 91.7  80.0 - 100.0 fL Final   MCH 04/14/2021 28.4  26.0 - 34.0 pg Final   MCHC 04/14/2021 31.0  30.0 - 36.0 g/dL Final   RDW 74/02/1447 15.2  11.5 - 15.5 % Final   Platelets 04/14/2021 159  150 - 400 K/uL Final   nRBC 04/14/2021 0.0  0.0 - 0.2 % Final   Neutrophils Relative % 04/14/2021 53  % Final   Neutro Abs 04/14/2021 2.1  1.7 - 7.7 K/uL Final   Lymphocytes Relative 04/14/2021 27  % Final   Lymphs Abs 04/14/2021 1.0  0.7 - 4.0 K/uL Final   Monocytes Relative 04/14/2021 14  % Final   Monocytes Absolute 04/14/2021 0.6  0.1 - 1.0 K/uL Final   Eosinophils Relative 04/14/2021 4  % Final   Eosinophils Absolute 04/14/2021 0.2  0.0 - 0.5 K/uL Final   Basophils Relative 04/14/2021 1  % Final   Basophils Absolute 04/14/2021 0.0  0.0 - 0.1 K/uL Final   Immature Granulocytes 04/14/2021 1  % Final   Abs Immature Granulocytes 04/14/2021 0.02  0.00 - 0.07 K/uL Final   Sodium 04/14/2021 139  135 - 145 mmol/L Final   Potassium 04/14/2021 4.9  3.5 - 5.1 mmol/L Final   Chloride 04/14/2021 110  98 - 111 mmol/L Final   CO2 04/14/2021 23  22 - 32 mmol/L Final   Glucose, Bld 04/14/2021 86  70 - 99 mg/dL Final   BUN 18/56/3149 38 (A) 8 - 23 mg/dL Final   Creatinine, Ser 04/14/2021 2.35 (A) 0.44 - 1.00 mg/dL Final  Calcium 04/14/2021 8.9  8.9 - 10.3 mg/dL Final   Total Protein 16/04/9603 5.6 (A) 6.5 - 8.1 g/dL Final   Albumin 54/03/8118 3.1 (A) 3.5 - 5.0 g/dL Final   AST 14/78/2956 22  15 - 41 U/L Final   ALT 04/14/2021 19  0 - 44 U/L Final   Alkaline Phosphatase 04/14/2021 73  38 - 126 U/L Final   Total Bilirubin 04/14/2021 0.5  0.3 - 1.2 mg/dL Final   GFR, Estimated 04/14/2021 20 (A) >60 mL/min Final   Anion gap 04/14/2021 6  5 - 15 Final   Magnesium 04/14/2021 2.2  1.7 - 2.4 mg/dL Final   Sodium 21/30/8657  138  135 - 145 mmol/L Final   Potassium 04/15/2021 5.5 (A) 3.5 - 5.1 mmol/L Final   Chloride 04/15/2021 109  98 - 111 mmol/L Final   CO2 04/15/2021 24  22 - 32 mmol/L Final   Glucose, Bld 04/15/2021 106 (A) 70 - 99 mg/dL Final   BUN 84/69/6295 28 (A) 8 - 23 mg/dL Final   Creatinine, Ser 04/15/2021 1.85 (A) 0.44 - 1.00 mg/dL Final   Calcium 28/41/3244 8.8 (A) 8.9 - 10.3 mg/dL Final   GFR, Estimated 04/15/2021 26 (A) >60 mL/min Final   Anion gap 04/15/2021 5  5 - 15 Final   WBC 04/15/2021 3.5 (A) 4.0 - 10.5 K/uL Final   RBC 04/15/2021 2.88 (A) 3.87 - 5.11 MIL/uL Final   Hemoglobin 04/15/2021 8.2 (A) 12.0 - 15.0 g/dL Final   HCT 07/05/7251 26.4 (A) 36.0 - 46.0 % Final   MCV 04/15/2021 91.7  80.0 - 100.0 fL Final   MCH 04/15/2021 28.5  26.0 - 34.0 pg Final   MCHC 04/15/2021 31.1  30.0 - 36.0 g/dL Final   RDW 66/44/0347 14.8  11.5 - 15.5 % Final   Platelets 04/15/2021 157  150 - 400 K/uL Final   nRBC 04/15/2021 0.0  0.0 - 0.2 % Final    Imaging Studies  No results found. Medications   Scheduled Meds:  escitalopram  10 mg Oral Daily   heparin  5,000 Units Subcutaneous Q8H   memantine  5 mg Oral BID     LOS: 1 day   Time spent: >9min  Leeroy Bock, DO [facility] 04/15/2021, 7:50 AM   To contact the [facility] Attending or Consulting provider for this patient: Check the care team in [facility] for a) attending/consulting [facility] provider listed and b) the [facility] team listed Log into [email or web address] and use [place]'s universal password to access. If you do not have the password, please contact the hospital operator. Locate the [facility] provider you are looking for under [facility] and page to a number that you can be directly reached. If you still have difficulty reaching the provider, please page the [facility] (Director on Call) for the Hospitalists listed on amion for assistance.

## 2021-04-16 DIAGNOSIS — N1832 Chronic kidney disease, stage 3b: Secondary | ICD-10-CM

## 2021-04-16 DIAGNOSIS — G9341 Metabolic encephalopathy: Secondary | ICD-10-CM

## 2021-04-16 DIAGNOSIS — R5381 Other malaise: Secondary | ICD-10-CM

## 2021-04-16 DIAGNOSIS — N170 Acute kidney failure with tubular necrosis: Secondary | ICD-10-CM | POA: Diagnosis not present

## 2021-04-16 LAB — BASIC METABOLIC PANEL
Anion gap: 7 (ref 5–15)
BUN: 19 mg/dL (ref 8–23)
CO2: 21 mmol/L — ABNORMAL LOW (ref 22–32)
Calcium: 8.9 mg/dL (ref 8.9–10.3)
Chloride: 111 mmol/L (ref 98–111)
Creatinine, Ser: 1.44 mg/dL — ABNORMAL HIGH (ref 0.44–1.00)
GFR, Estimated: 35 mL/min — ABNORMAL LOW (ref >60)
Glucose, Bld: 93 mg/dL (ref 70–99)
Potassium: 4.7 mmol/L (ref 3.5–5.1)
Sodium: 139 mmol/L (ref 135–145)

## 2021-04-16 MED ORDER — LOSARTAN POTASSIUM 50 MG PO TABS
100.0000 mg | ORAL_TABLET | Freq: Every day | ORAL | Status: DC
  Administered 2021-04-16 – 2021-04-17 (×2): 100 mg via ORAL
  Filled 2021-04-16 (×2): qty 2

## 2021-04-16 MED ORDER — HYDROCHLOROTHIAZIDE 25 MG PO TABS
25.0000 mg | ORAL_TABLET | Freq: Every day | ORAL | Status: DC
  Administered 2021-04-16 – 2021-04-17 (×2): 25 mg via ORAL
  Filled 2021-04-16 (×2): qty 1

## 2021-04-16 MED ORDER — LOSARTAN POTASSIUM-HCTZ 100-25 MG PO TABS: 1.0000 | ORAL_TABLET | Freq: Every day | ORAL | Status: DC

## 2021-04-16 NOTE — Hospital Course (Addendum)
Ms. Valladares is an 87 year old female with PMH CKD3, depression, insomnia, HTN, osteopenia, scoliosis on chronic opioids, HLD who presented to the hospital with AMS. She was admitted for further work-up and evaluation. After admission, she was found to have acute on chronic renal failure which may have been contributing to some of her altered mentation.  She was also noted to have multiple psychotropic medications that could also be contributing to somnolence and confusion.  See below for further A&P

## 2021-04-16 NOTE — Progress Notes (Signed)
Progress Note    Nancy Suarez   PRX:[id number]  DOB: [birth year]  DOA: 04/13/2021     2 Date of Service: 04/16/2021   Clinical Course Nancy Suarez is an 87 year old female with PMH CKD3, depression, insomnia, HTN, osteopenia, scoliosis on chronic opioids, HLD who presented to the hospital with AMS. She was admitted for further work-up and evaluation. After admission, she was found to have acute on chronic renal failure which may have been contributing to some of her altered mentation.  She was also noted to have multiple psychotropic medications that could also be contributing to somnolence and confusion.  Assessment and Plan * Acute renal failure superimposed on stage 3b chronic kidney disease ([place]) - patient has history of CKD3b. Baseline creat ~ 1.4, eGFR 35 - patient presents with increase in creat >0.3 mg/dL above baseline, creat increase >1.5x baseline presumed to have occurred within past 7 days PTA - has improved with hydration    Acute metabolic encephalopathy - differential includes polypharmacy/over-sedation vs underlying cognitive impairment  -Continue holding sedating medications and monitoring mentation response -Currently she is alert and oriented x3   Chronic insomnia - chronically on ambien and remeron; these are felt to be contributing to her encephalopathy  - continue holding   Physical deconditioning - PT eval for dispo planning   HLD (hyperlipidemia) Continue lipitor.  Essential hypertension - Home meds initially on hold due to AKI - Blood pressure starting to uptrend -Renal function back to baseline, will resume home meds  GERD (gastroesophageal reflux disease) - stable; no sxms     Subjective:  No events overnight.  Still slightly confused resting in bed this morning but was oriented to name, president, place.  Objective Vitals:   04/15/21 2317 04/16/21 0317 04/16/21 0915 04/16/21 1219  BP: (!) 144/57 (!) 141/74 (!) 157/61 (!) 177/74  Pulse:    79 68  Resp: '18 16 16 16  ' Temp: 98.2 F (36.8 C) 98.6 F (37 C) 97.9 F (36.6 C) 97.6 F (36.4 C)  TempSrc: Oral Oral Oral Oral  SpO2: 99% 100% 100% 100%  Weight:  71.2 kg    Height:       71.2 kg  Vital signs were reviewed and unremarkable.   Exam Physical Exam Constitutional:      General: She is not in acute distress.    Appearance: Normal appearance.  HENT:     Head: Normocephalic and atraumatic.     Mouth/Throat:     Mouth: Mucous membranes are moist.  Eyes:     Extraocular Movements: Extraocular movements intact.  Cardiovascular:     Rate and Rhythm: Normal rate and regular rhythm.  Pulmonary:     Effort: Pulmonary effort is normal.     Breath sounds: Normal breath sounds. No wheezing.  Abdominal:     General: Bowel sounds are normal. There is no distension.     Palpations: Abdomen is soft.     Tenderness: There is no abdominal tenderness.  Musculoskeletal:        General: Normal range of motion.     Cervical back: Normal range of motion and neck supple.  Skin:    General: Skin is warm and dry.  Neurological:     Mental Status: She is alert and oriented to person, place, and time.  Psychiatric:        Mood and Affect: Mood normal.        Behavior: Behavior normal.     Labs / Other Information My review  of labs, imaging, notes and other tests shows no new significant findings.    Disposition Plan: Status is: Inpatient  Remains inpatient appropriate because: Ongoing encephalopathy and PT eval pending  Time spent: Greater than 50% of the 35 minute visit was spent in counseling/coordination of care for the patient as laid out in the A&P.  Dwyane Dee, MD [facility] 04/16/2021, 1:14 PM

## 2021-04-16 NOTE — Assessment & Plan Note (Addendum)
-   Patient ambulated over 100 feet with rolling walker in hallway prior to discharge

## 2021-04-16 NOTE — Plan of Care (Signed)
  Problem: Clinical Measurements: Goal: Will remain free from infection Outcome: Progressing Goal: Diagnostic test results will improve Outcome: Progressing   Problem: Health Behavior/Discharge Planning: Goal: Ability to manage health-related needs will improve Outcome: Not Progressing   

## 2021-04-16 NOTE — Progress Notes (Signed)
Patient called out and asked for something for her back and stomach. This RN asked the patient if she wanted tylenol. Patient responded "yes".   This RN went into the room to give the patient tylenol that she had requested. Patient wanted this RN to call her daughter and make sure that it was okay to take the tylenol. This RN confirmed with the patient that she wanted her to call her daughter at 0220 am to ask her about the tylenol. Yes was the answer.   Daughter called. Daughter voiced that it was okay to give the tylenol. Patient wanted to hear her daughter say it to her. Phone handed to patient  Patient was told that she was in the hospital. Patient repeats and confirm that she is in the hospital.   Tylenol given. Will continue to monitor

## 2021-04-16 NOTE — Progress Notes (Signed)
Patient's IV bleeding at site. This RN informed the patient that she was going to try to save the IV so that she wouldn't have to get another. Upon assessment, IV site bleeding, unable to save IV.  This RN informed the patient that she would have to have another IV. Patient voiced understanding.   Patient saw the blood on the dressing and got a little upset that this RN did not tell her what she was doing.   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   This RN informed the patient what she did at the time with her IV and patient was in understanding.   Patient continued to be clearly upset and asked this RN to call her daughter and let her know what was being done to her.   This RN agreed and called daughter and explained what was going on. Daughter didn't understand what was going on since she had just spoken to her and was on the phone when this RN was working on the IV and she was fine.  Daughter stated that she would call her.

## 2021-04-17 ENCOUNTER — Inpatient Hospital Stay (HOSPITAL_COMMUNITY): Payer: Medicare Other

## 2021-04-17 DIAGNOSIS — G9341 Metabolic encephalopathy: Secondary | ICD-10-CM | POA: Diagnosis not present

## 2021-04-17 DIAGNOSIS — N170 Acute kidney failure with tubular necrosis: Secondary | ICD-10-CM | POA: Diagnosis not present

## 2021-04-17 DIAGNOSIS — N1832 Chronic kidney disease, stage 3b: Secondary | ICD-10-CM | POA: Diagnosis not present

## 2021-04-17 LAB — CBC WITH DIFFERENTIAL/PLATELET
Abs Immature Granulocytes: 0.02 10*3/uL (ref 0.00–0.07)
Basophils Absolute: 0 10*3/uL (ref 0.0–0.1)
Basophils Relative: 1 %
Eosinophils Absolute: 0 10*3/uL (ref 0.0–0.5)
Eosinophils Relative: 1 %
HCT: 27.7 % — ABNORMAL LOW (ref 36.0–46.0)
Hemoglobin: 8.7 g/dL — ABNORMAL LOW (ref 12.0–15.0)
Immature Granulocytes: 1 %
Lymphocytes Relative: 22 %
Lymphs Abs: 0.9 10*3/uL (ref 0.7–4.0)
MCH: 28.2 pg (ref 26.0–34.0)
MCHC: 31.4 g/dL (ref 30.0–36.0)
MCV: 89.9 fL (ref 80.0–100.0)
Monocytes Absolute: 0.5 10*3/uL (ref 0.1–1.0)
Monocytes Relative: 12 %
Neutro Abs: 2.6 10*3/uL (ref 1.7–7.7)
Neutrophils Relative %: 63 %
Platelets: 147 10*3/uL — ABNORMAL LOW (ref 150–400)
RBC: 3.08 MIL/uL — ABNORMAL LOW (ref 3.87–5.11)
RDW: 14.6 % (ref 11.5–15.5)
WBC: 3.9 10*3/uL — ABNORMAL LOW (ref 4.0–10.5)
nRBC: 0 % (ref 0.0–0.2)

## 2021-04-17 LAB — BASIC METABOLIC PANEL
Anion gap: 7 (ref 5–15)
BUN: 14 mg/dL (ref 8–23)
CO2: 21 mmol/L — ABNORMAL LOW (ref 22–32)
Calcium: 8.9 mg/dL (ref 8.9–10.3)
Chloride: 109 mmol/L (ref 98–111)
Creatinine, Ser: 1.4 mg/dL — ABNORMAL HIGH (ref 0.44–1.00)
GFR, Estimated: 36 mL/min — ABNORMAL LOW (ref >60)
Glucose, Bld: 87 mg/dL (ref 70–99)
Potassium: 4.6 mmol/L (ref 3.5–5.1)
Sodium: 137 mmol/L (ref 135–145)

## 2021-04-17 LAB — MAGNESIUM: Magnesium: 1.7 mg/dL (ref 1.7–2.4)

## 2021-04-17 MED ORDER — TRAMADOL HCL 50 MG PO TABS
50.0000 mg | ORAL_TABLET | Freq: Once | ORAL | Status: AC | PRN
Start: 2021-04-17 — End: 2021-04-17
  Administered 2021-04-17: 50 mg via ORAL
  Filled 2021-04-17: qty 1

## 2021-04-17 NOTE — Progress Notes (Signed)
Mobility Specialist Progress Note:   04/17/21 1145  Mobility  Activity Ambulated in hall  Level of Assistance Contact guard assist, steadying assist  Assistive Device Front wheel walker  Distance Ambulated (ft) 130 ft  Mobility Ambulated with assistance in hallway  Mobility Response Tolerated well  Mobility performed by Mobility specialist  $Mobility charge 1 Mobility   Pt received in bed, eager for ambulation. Ambulated in hall 130' with RW and contactG. Required multiple standing rest breaks d/t pain in R knee and SOB. Pt left sitting EOB eager for d/c.  Addison Lank Mobility Specialist  Phone [phone]

## 2021-04-17 NOTE — Progress Notes (Signed)
Chaplain responded to [facility], pt requests prayer.  Pt appeared tired but had a very positive attitude. Stated she is going home today. Welcomed praye.  Please contact as needed for further support.  Belia Heman, [place] [phone]

## 2021-04-17 NOTE — Progress Notes (Signed)
Pt c/o abdominal pain, MD notified, new order received. We'll continue to monitor.

## 2021-04-17 NOTE — Discharge Summary (Signed)
Physician Discharge Summary   Patient name: Nancy Suarez  Admit date:     04/13/2021  Discharge date: 04/17/2021  Discharge Physician: Dwyane Dee   PCP: Vincente Liberty, MD   Recommendations at discharge:  Taper off remeron and any other sedating meds as able Patient informed to wean percocet use as well Discontinued ambien   Discharge Diagnoses Active Problems:   Chronic insomnia   Acute renal failure superimposed on stage 3b chronic kidney disease ([place])   Physical deconditioning   GERD (gastroesophageal reflux disease)   Essential hypertension   HLD (hyperlipidemia)   Resolved Diagnoses Principal Problem (Resolved):   Acute metabolic encephalopathy   Hospital Course   Nancy Suarez is an 87 year old female with PMH CKD3, depression, insomnia, HTN, osteopenia, scoliosis on chronic opioids, HLD who presented to the hospital with AMS. She was admitted for further work-up and evaluation. After admission, she was found to have acute on chronic renal failure which may have been contributing to some of her altered mentation.  She was also noted to have multiple psychotropic medications that could also be contributing to somnolence and confusion.  See below for further A&P   * Acute metabolic encephalopathy-resolved as of 04/17/2021 - differential includes polypharmacy/over-sedation vs underlying cognitive impairment  -Continue holding sedating medications and monitoring mentation response -Currently she is alert and oriented x3 - remains AOx3 prior to discharge; high suspicion that her symptoms were polypharmacy related and worsened by some possible uremia - discussed with her daughter prior to discharge: d/c ambien, cut back on percocet and discuss with PCP about tapering off remeron.    Acute renal failure superimposed on stage 3b chronic kidney disease ([place]) - patient has history of CKD3b. Baseline creat ~ 1.4, eGFR 35 - patient presents with increase in creat >0.3  mg/dL above baseline, creat increase >1.5x baseline presumed to have occurred within past 7 days PTA -BUN 42 and creatinine 2.7 at time of admission - has improved with hydration  -Creatinine has returned to baseline at time of discharge, 1.41 and BUN down to 14   Chronic insomnia - chronically on ambien and remeron; these are felt to be contributing to her encephalopathy  -Ambien discontinued at discharge.  Patient's daughter informed to discuss tapering off of Remeron with PCP at discharge -Informed daughter, okay to try melatonin for sleep  Physical deconditioning - Patient ambulated over 100 feet with rolling walker in hallway prior to discharge  HLD (hyperlipidemia) Continue lipitor.  Essential hypertension - Home meds initially on hold due to AKI - Blood pressure starting to uptrend -Renal function back to baseline, will resume home meds  GERD (gastroesophageal reflux disease) - stable; no sxms    Procedures performed:    Condition at discharge: stable  Exam Physical Exam Constitutional:      General: She is not in acute distress.    Appearance: Normal appearance. She is not ill-appearing.  HENT:     Head: Normocephalic and atraumatic.     Mouth/Throat:     Mouth: Mucous membranes are moist.  Eyes:     Extraocular Movements: Extraocular movements intact.     Pupils: Pupils are equal, round, and reactive to light.  Cardiovascular:     Rate and Rhythm: Normal rate and regular rhythm.  Pulmonary:     Effort: Pulmonary effort is normal.     Breath sounds: Normal breath sounds. No wheezing.  Abdominal:     General: Bowel sounds are normal. There is no distension.  Palpations: Abdomen is soft.     Tenderness: There is no abdominal tenderness.  Musculoskeletal:        General: Normal range of motion.     Cervical back: Normal range of motion and neck supple.  Skin:    General: Skin is warm and dry.  Neurological:     General: No focal deficit present.      Mental Status: She is alert and oriented to person, place, and time.  Psychiatric:        Mood and Affect: Mood normal.        Behavior: Behavior normal.     Disposition: Home  Discharge time: greater than 30 minutes.  Follow-up Information     Vincente Liberty, MD. Schedule an appointment as soon as possible for a visit in 1 week(s).   Specialty: Pulmonary Disease Contact information: [place] [address] [phone]                 Allergies as of 04/17/2021       Reactions   Aspirin    Upsets stomach if 332m   Codeine Nausea And Vomiting   Namenda [memantine]    Agitation, aggression   Sulfa Antibiotics Nausea Only        Medication List     STOP taking these medications    memantine 5 MG tablet Commonly known as: Namenda   zolpidem 10 MG tablet Commonly known as: AMBIEN       TAKE these medications    acetaminophen 325 MG tablet Commonly known as: TYLENOL Take 2 tablets (650 mg total) by mouth every 4 (four) hours as needed for mild pain ((score 1 to 3) or temp > 100.5). What changed:  when to take this additional instructions   carboxymethylcellulose 0.5 % Soln Commonly known as: REFRESH PLUS Place 1 drop into both eyes as needed (Dry eyes).   cetirizine 10 MG tablet Commonly known as: ZYRTEC Take 10 mg by mouth daily.   escitalopram 10 MG tablet Commonly known as: LEXAPRO Take 10 mg by mouth daily.   famotidine 20 MG tablet Commonly known as: PEPCID Take 20 mg by mouth 2 (two) times daily.   losartan-hydrochlorothiazide 100-25 MG tablet Commonly known as: HYZAAR Take 1 tablet by mouth daily.   mirtazapine 15 MG tablet Commonly known as: REMERON Take 15 mg by mouth at bedtime.   multivitamin with minerals Tabs tablet Take 1 tablet by mouth daily.   oxyCODONE-acetaminophen 7.5-325 MG tablet Commonly known as: PERCOCET Take 1 tablet by mouth in the morning and at bedtime.        DG Abd 1  View  Result Date: 04/17/2021 CLINICAL DATA:  Abdominal pain EXAM: ABDOMEN - 1 VIEW COMPARISON:  None. FINDINGS: Normal bowel gas pattern. No concerning mass effect or calcification when accounting for pelvic phleboliths. Scoliosis and generalized spinal degeneration. IMPRESSION: Normal bowel gas pattern. Electronically Signed   By: JJorje GuildM.D.   On: 04/17/2021 07:28   CT Head Wo Contrast  Result Date: 04/13/2021 CLINICAL DATA:  Mental status change, unknown cause EXAM: CT HEAD WITHOUT CONTRAST TECHNIQUE: Contiguous axial images were obtained from the base of the skull through the vertex without intravenous contrast. COMPARISON:  CT head 06/06/2017.  MRI head 09/23/2020. FINDINGS: Brain: No evidence of acute large vascular territory infarction, hemorrhage, hydrocephalus, extra-axial collection or mass lesion/mass effect. Moderate patchy white matter hypoattenuation, nonspecific but compatible with chronic microvascular ischemic disease. Generalized atrophy with ex vacuo ventricular  dilation. Vascular: No hyperdense vessel identified. Calcific intracranial atherosclerosis. Skull: No acute fracture.  Hyperostosis frontalis. Sinuses/Orbits: Inferior left maxillary sinus mucosal thickening. No acute orbital findings. Other: No mastoid effusions. IMPRESSION: 1. No evidence of acute intracranial abnormality. 2. Moderate chronic microvascular ischemic disease. Electronically Signed   By: Margaretha Sheffield M.D.   On: 04/13/2021 18:46   US Renal  Result Date: 04/13/2021 CLINICAL DATA:  Kidney injury EXAM: RENAL / URINARY TRACT ULTRASOUND COMPLETE COMPARISON:  None. FINDINGS: Right Kidney: Renal measurements: 9.7 x 6.8 x 5.9 cm = volume: 205 mL. Right renal cortical thinning. Echotexture grossly normal. Exophytic 3.1 cm right renal cyst. Evaluation is limited due to poor acoustic window. Left Kidney: Renal measurements: 7.6 x 4.4 x 4.2 cm = volume: 72 mL. Normal echotexture. Exophytic cyst upper pole  measures up to 4.3 cm. No hydronephrosis or nephrolithiasis. Bladder: Appears normal for degree of bladder distention. Other: None. IMPRESSION: 1. Limited evaluation of the right kidney due to poor acoustic window. 2. Mild right renal cortical thinning. Otherwise normal renal echotexture bilaterally. 3. Bilateral renal cysts. Electronically Signed   By: Randa Ngo M.D.   On: 04/13/2021 21:05   DG Chest Portable 1 View  Result Date: 04/13/2021 CLINICAL DATA:  Altered level of consciousness EXAM: PORTABLE CHEST 1 VIEW COMPARISON:  01/12/2014 FINDINGS: Single frontal view of the chest demonstrates an unremarkable cardiac silhouette. Chronic elevation the right hemidiaphragm. No airspace disease, effusion, or pneumothorax. No acute bony abnormalities. IMPRESSION: 1. Stable exam, no acute process. Electronically Signed   By: Randa Ngo M.D.   On: 04/13/2021 18:31   Results for orders placed or performed during the hospital encounter of 04/13/21  Resp Panel by RT-PCR (Flu A&B, Covid)     Status: None   Collection Time: 04/13/21  6:55 PM   Specimen: Nasopharyngeal(NP) swabs in vial transport medium  Result Value Ref Range Status   SARS Coronavirus 2 by RT PCR NEGATIVE NEGATIVE Final    Comment: (NOTE) SARS-CoV-2 target nucleic acids are NOT DETECTED.  The SARS-CoV-2 RNA is generally detectable in upper respiratory specimens during the acute phase of infection. The lowest concentration of SARS-CoV-2 viral copies this assay can detect is 138 copies/mL. A negative result does not preclude SARS-Cov-2 infection and should not be used as the sole basis for treatment or other patient management decisions. A negative result may occur with  improper specimen collection/handling, submission of specimen other than nasopharyngeal swab, presence of viral mutation(s) within the areas targeted by this assay, and inadequate number of viral copies(<138 copies/mL). A negative result must be combined  with clinical observations, patient history, and epidemiological information. The expected result is Negative.  Fact Sheet for Patients:  EntrepreneurPulse.com.au  Fact Sheet for Healthcare Providers:  IncredibleEmployment.be  This test is no t yet approved or cleared by the [place] FDA and  has been authorized for detection and/or diagnosis of SARS-CoV-2 by FDA under an Emergency Use Authorization (EUA). This EUA will remain  in effect (meaning this test can be used) for the duration of the COVID-19 declaration under Section 564(b)(1) of the Act, 21 U.S.C.section 360bbb-3(b)(1), unless the authorization is terminated  or revoked sooner.       Influenza A by PCR NEGATIVE NEGATIVE Final   Influenza B by PCR NEGATIVE NEGATIVE Final    Comment: (NOTE) The Xpert Xpress SARS-CoV-2/FLU/RSV plus assay is intended as an aid in the diagnosis of influenza from Nasopharyngeal swab specimens and should not be used as a sole  basis for treatment. Nasal washings and aspirates are unacceptable for Xpert Xpress SARS-CoV-2/FLU/RSV testing.  Fact Sheet for Patients: EntrepreneurPulse.com.au  Fact Sheet for Healthcare Providers: IncredibleEmployment.be  This test is not yet approved or cleared by the [place] FDA and has been authorized for detection and/or diagnosis of SARS-CoV-2 by FDA under an Emergency Use Authorization (EUA). This EUA will remain in effect (meaning this test can be used) for the duration of the COVID-19 declaration under Section 564(b)(1) of the Act, 21 U.S.C. section 360bbb-3(b)(1), unless the authorization is terminated or revoked.  Performed at [facility], [address], [place], [place] [address]   Urine Culture     Status: Abnormal   Collection Time: 04/13/21  6:55 PM   Specimen: Urine, Random  Result Value Ref Range Status   Specimen Description   Final    URINE,  RANDOM Performed at [facility], [address] [place], [place] [address]    Special Requests   Final    NONE Performed at [facility], [address] [place], [place] [address]    Culture MULTIPLE SPECIES PRESENT, SUGGEST RECOLLECTION (A)  Final   Report Status 04/15/2021 FINAL  Final    Signed:  Dwyane Dee MD.  [facility] 04/17/2021, 12:34 PM

## 2021-04-17 NOTE — Discharge Instructions (Signed)
Talk to your doctor about tapering off remeron.   Cut back on percocet use and try to taper off if able and use plain Tylenol for aches and pains.  Stop taking ambien and try melatonin at night to help sleep.

## 2021-04-20 ENCOUNTER — Ambulatory Visit: Payer: Medicare Other | Admitting: Neurology

## 2021-04-21 DIAGNOSIS — N390 Urinary tract infection, site not specified: Secondary | ICD-10-CM | POA: Diagnosis not present

## 2021-04-21 DIAGNOSIS — Q820 Hereditary lymphedema: Secondary | ICD-10-CM | POA: Diagnosis not present

## 2021-04-21 DIAGNOSIS — Z6826 Body mass index (BMI) 26.0-26.9, adult: Secondary | ICD-10-CM | POA: Diagnosis not present

## 2021-04-21 DIAGNOSIS — G4700 Insomnia, unspecified: Secondary | ICD-10-CM | POA: Diagnosis not present

## 2021-05-15 DIAGNOSIS — I509 Heart failure, unspecified: Secondary | ICD-10-CM | POA: Diagnosis not present

## 2021-05-15 DIAGNOSIS — F32A Depression, unspecified: Secondary | ICD-10-CM | POA: Diagnosis not present

## 2021-05-15 DIAGNOSIS — M419 Scoliosis, unspecified: Secondary | ICD-10-CM | POA: Diagnosis not present

## 2021-05-15 DIAGNOSIS — N1832 Chronic kidney disease, stage 3b: Secondary | ICD-10-CM | POA: Diagnosis not present

## 2021-05-15 DIAGNOSIS — R5381 Other malaise: Secondary | ICD-10-CM | POA: Diagnosis not present

## 2021-05-15 DIAGNOSIS — F5104 Psychophysiologic insomnia: Secondary | ICD-10-CM | POA: Diagnosis not present

## 2021-05-15 DIAGNOSIS — E7800 Pure hypercholesterolemia, unspecified: Secondary | ICD-10-CM | POA: Diagnosis not present

## 2021-05-15 DIAGNOSIS — M1990 Unspecified osteoarthritis, unspecified site: Secondary | ICD-10-CM | POA: Diagnosis not present

## 2021-05-15 DIAGNOSIS — I130 Hypertensive heart and chronic kidney disease with heart failure and stage 1 through stage 4 chronic kidney disease, or unspecified chronic kidney disease: Secondary | ICD-10-CM | POA: Diagnosis not present

## 2021-05-15 DIAGNOSIS — Q820 Hereditary lymphedema: Secondary | ICD-10-CM | POA: Diagnosis not present

## 2021-05-15 DIAGNOSIS — K219 Gastro-esophageal reflux disease without esophagitis: Secondary | ICD-10-CM | POA: Diagnosis not present

## 2021-05-15 DIAGNOSIS — J309 Allergic rhinitis, unspecified: Secondary | ICD-10-CM | POA: Diagnosis not present

## 2021-05-19 DIAGNOSIS — H25812 Combined forms of age-related cataract, left eye: Secondary | ICD-10-CM | POA: Diagnosis not present

## 2021-05-19 DIAGNOSIS — H402234 Chronic angle-closure glaucoma, bilateral, indeterminate stage: Secondary | ICD-10-CM | POA: Diagnosis not present

## 2021-06-06 NOTE — Progress Notes (Deleted)
No chief complaint on file.    HISTORY OF PRESENT ILLNESS:  06/06/21 ALL:  Nancy Suarez is a 87 y.o. female here today for follow up for memory loss. She was last seen by Dr Nancy Suarez 01/2021. PCP had decreased Ambien dose to 69m QHS and decreased gabapentin dose. She continued hydrocodone twice daily, Remeron 154mQHS and lexapro 1012maily. Dr AthRexene Albertss hesitant to start Aricept due to low heart rate and opted to start low dose Namenda. She was advised to start 5mg14mily for 1 month then increase to BID dosing. Her daughter called to report behavioral changes 03/10/2021. She was advised to stop memantine. Daughter called again 03/30/2021 with acute behavioral concerns and advised to seek eval with PCP for possible infection. She was hospitalized for UTI 04/13/2021. At discharge, Ambien was discontinued. She was encouraged to continue weaning Remeron and Percocet (switched from hydrocodone?    HISTORY (copied from Dr AthaGuadelupe Sabinvious note)  Nancy Suarez 89 y28r old right-handed woman with an underlying medical history of reflux disease, goiter, hypertension, hyperlipidemia, osteopenia, depression, pancreatitis, spinal arthritis, scoliosis, anemia, chronic kidney disease, anxiety, joint pain and insomnia, who presents for FU consultation of her memory loss. The patient is accompanied by her daughter, Nancy Fredericksonain today.  I first met her on 09/06/20 at the request of her PCP, at which time she was reported to have forgetfulness for at least 6 months.  Her MMSE was 20/30 at the time. I suggest we proceed with additional blood work and a brain MRI.  Her labs included Hemoglobin A1c, vitamin B6, vitamin D, CRP, rheumatoid factor, B12 and B1.Her rheumatoid factor was positive.  Other test results were benign.  She was notified of the results.We had also talked about medication affecting her cognitive function as she was on multiple medications at the time.SheWas advised to discuss medication management  with her primary care physician.She had a brain MRI without contrast on 09/23/2020 and I reviewed the results:   IMPRESSION: Unremarkable MRI scan of the brain without contrast showing only mild age-related changes of chronic small vessel disease and generalized cerebral atrophy.   She was notified of the test results.   Today, 03/01/21: She reports feeling fairly stable, she does not give much in the way of her own history but reports that she has been on Ambien for many years.  She is reluctant to come off of it.  Daughter reports that patient has been staying with her for nearly 2 years in December of this year.  Patient has had recent mood irritability and frustration.  Daughter reports that her stepson moved in recently and is currently staying with them, he is 25 y79rs old.  Stepdaughter is 28 y51rs old and was also staying with them temporarily and just a few days ago moved out.  While the stepchildren were staying in their home patient's daughter reports that she took a brief trip with her husband for their anniversary.  While she was gone, patient had more mood irritability and refused to take her medications.  The day.  They came back this past weekend, patient had taken her Ambien and had a fall as she got up shortly after taking the Ambien.  Thankfully, she did not injure herself.  She did not lose consciousness or hit her head but daughter reports that she looked really dazed and groggy.  Patient's daughter is still concerned about patient's medications.  However, her primary care physician has been able to reduce her  medications, she is currently on Ambien 5 mg strength half a pill daily at bedtime.  Gabapentin has been reduced from 300 mg to 100 mg and she currently takes it only once a day.  Hydrocodone is currently twice daily as needed.  She typically does take it twice daily.  She continues to be on Remeron 15 mg at bedtime.  She continues to be on Lexapro 10 mg daily.  Memory function per se  is stable with the exception that patient has confusion from time to time per daughter.  Patient lives with Nancy Suarez, [place] husband and their 74 year old daughter.  Per Nancy Suarez, patient is very close to her 16 year old granddaughter but due to recent mood irritability, that relationship has been strained.  Daughter just started school after the summer break, fifth grade.     REVIEW OF SYSTEMS: Out of a complete 14 system review of symptoms, the patient complains only of the following symptoms, and all other reviewed systems are negative.   ALLERGIES: Allergies  Allergen Reactions   Aspirin     Upsets stomach if 323m   Codeine Nausea And Vomiting   Namenda [Memantine]     Agitation, aggression   Sulfa Antibiotics Nausea Only     HOME MEDICATIONS: Outpatient Medications Prior to Visit  Medication Sig Dispense Refill   acetaminophen (TYLENOL) 325 MG tablet Take 2 tablets (650 mg total) by mouth every 4 (four) hours as needed for mild pain ((score 1 to 3) or temp > 100.5). (Patient taking differently: Take 650 mg by mouth daily. Also an additional 6567min the evening as needed for pain.) 30 tablet 0   carboxymethylcellulose (REFRESH PLUS) 0.5 % SOLN Place 1 drop into both eyes as needed (Dry eyes).     cetirizine (ZYRTEC) 10 MG tablet Take 10 mg by mouth daily.     escitalopram (LEXAPRO) 10 MG tablet Take 10 mg by mouth daily.     famotidine (PEPCID) 20 MG tablet Take 20 mg by mouth 2 (two) times daily.     losartan-hydrochlorothiazide (HYZAAR) 100-25 MG tablet Take 1 tablet by mouth daily.     mirtazapine (REMERON) 15 MG tablet Take 15 mg by mouth at bedtime.     Multiple Vitamin (MULTIVITAMIN WITH MINERALS) TABS tablet Take 1 tablet by mouth daily.     oxyCODONE-acetaminophen (PERCOCET) 7.5-325 MG tablet Take 1 tablet by mouth in the morning and at bedtime.     No facility-administered medications prior to visit.     PAST MEDICAL HISTORY: Past Medical History:  Diagnosis Date    ASCUS (atypical squamous cells of undetermined significance) on Pap smear    Atrophic vaginitis    Chronic insomnia    Depression    GERD (gastroesophageal reflux disease)    Goiter    History of kidney stones    Hypercholesteremia    Hypertension    LGSIL (low grade squamous intraepithelial dysplasia)    Osteopenia    Ovarian cyst    "shrank it"   Pancreatitis    Scoliosis    Spinal arthritis      PAST SURGICAL HISTORY: Past Surgical History:  Procedure Laterality Date   ABDOMINAL HYSTERECTOMY  1977   partial   KIDNEY STONE SURGERY  1990's   2-3 stones x1 surgery   ORIF FEMUR FRACTURE Left 06/09/2017   Procedure: OPEN REDUCTION INTERNAL FIXATION (ORIF) DISTAL FEMUR FRACTURE;  Surgeon: OlParalee CancelMD;  Location: [facility];  Service: Orthopedics;  Laterality: Left;   TONSILLECTOMY  as teenager  TOTAL KNEE ARTHROPLASTY Left 01/19/2014   Procedure: LEFT TOTAL KNEE ARTHROPLASTY;  Surgeon: Mauri Pole, MD;  Location: [facility];  Service: Orthopedics;  Laterality: Left;   WISDOM TOOTH EXTRACTION       FAMILY HISTORY: Family History  Problem Relation Age of Onset   Hypertension Father    Cancer Father    Cancer Mother        Bone   Cancer Brother      SOCIAL HISTORY: Social History   Socioeconomic History   Marital status: Married    Spouse name: Not on file   Number of children: Not on file   Years of education: Not on file   Highest education level: Not on file  Occupational History   Not on file  Tobacco Use   Smoking status: Former    Years: 20.00    Types: Cigarettes    Quit date: 07/03/1978    Years since quitting: 42.9   Smokeless tobacco: Former  Scientific laboratory technician Use: Never used  Substance and Sexual Activity   Alcohol use: Yes    Comment: occ   Drug use: No   Sexual activity: Yes    Birth control/protection: Surgical    Comment: hysterectomy  Other Topics Concern   Not on file  Social History Narrative   Not on file   Social Determinants  of Health   Financial Resource Strain: Not on file  Food Insecurity: Not on file  Transportation Needs: Not on file  Physical Activity: Not on file  Stress: Not on file  Social Connections: Not on file  Intimate Partner Violence: Not on file     PHYSICAL EXAM  There were no vitals filed for this visit. There is no height or weight on file to calculate BMI.  Generalized: Well developed, in no acute distress  Cardiology: normal rate and rhythm, no murmur auscultated  Respiratory: clear to auscultation bilaterally    Neurological examination  Mentation: Alert oriented to time, place, history taking. Follows all commands speech and language fluent Cranial nerve II-XII: Pupils were equal round reactive to light. Extraocular movements were full, visual field were full on confrontational test. Facial sensation and strength were normal. Uvula tongue midline. Head turning and shoulder shrug  were normal and symmetric. Motor: The motor testing reveals 5 over 5 strength of all 4 extremities. Good symmetric motor tone is noted throughout.  Sensory: Sensory testing is intact to soft touch on all 4 extremities. No evidence of extinction is noted.  Coordination: Cerebellar testing reveals good finger-nose-finger and heel-to-shin bilaterally.  Gait and station: Gait is normal. Tandem gait is normal. Romberg is negative. No drift is seen.  Reflexes: Deep tendon reflexes are symmetric and normal bilaterally.    DIAGNOSTIC DATA (LABS, IMAGING, TESTING) - I reviewed patient records, labs, notes, testing and imaging myself where available.  Lab Results  Component Value Date   WBC 3.9 (L) 04/17/2021   HGB 8.7 (L) 04/17/2021   HCT 27.7 (L) 04/17/2021   MCV 89.9 04/17/2021   PLT 147 (L) 04/17/2021      Component Value Date/Time   NA 137 04/17/2021 0140   K 4.6 04/17/2021 0140   CL 109 04/17/2021 0140   CO2 21 (L) 04/17/2021 0140   GLUCOSE 87 04/17/2021 0140   BUN 14 04/17/2021 0140    CREATININE 1.40 (H) 04/17/2021 0140   CALCIUM 8.9 04/17/2021 0140   PROT 5.6 (L) 04/14/2021 0441   ALBUMIN 3.1 (L) 04/14/2021 0441  AST 22 04/14/2021 0441   ALT 19 04/14/2021 0441   ALKPHOS 73 04/14/2021 0441   BILITOT 0.5 04/14/2021 0441   GFRNONAA 36 (L) 04/17/2021 0140   GFRAA 41 (L) 06/11/2017 0404   No results found for: CHOL, HDL, LDLCALC, LDLDIRECT, TRIG, CHOLHDL Lab Results  Component Value Date   HGBA1C 4.9 09/06/2020   Lab Results  Component Value Date   DNRJFKDC64 660 09/06/2020   No results found for: TSH  MMSE - Mini Mental State Exam 03/01/2021 09/06/2020  Orientation to time 3 4  Orientation to Place 4 4  Registration 3 3  Attention/ Calculation 1 0  Recall 1 2  Language- name 2 objects 2 2  Language- repeat 1 0  Language- follow 3 step command 3 3  Language- read & follow direction 1 1  Write a sentence 1 1  Copy design 1 0  Total score 21 20     No flowsheet data found.   ASSESSMENT AND PLAN  87 y.o. year old female  has a past medical history of ASCUS (atypical squamous cells of undetermined significance) on Pap smear, Atrophic vaginitis, Chronic insomnia, Depression, GERD (gastroesophageal reflux disease), Goiter, History of kidney stones, Hypercholesteremia, Hypertension, LGSIL (low grade squamous intraepithelial dysplasia), Osteopenia, Ovarian cyst, Pancreatitis, Scoliosis, and Spinal arthritis. here with    No diagnosis found.   No orders of the defined types were placed in this encounter.    No orders of the defined types were placed in this encounter.     Debbora Presto, MSN, FNP-C 06/06/2021, 9:20 AM  [facility] [address] [place], [place] [address] [phone]

## 2021-06-06 NOTE — Patient Instructions (Incomplete)

## 2021-06-07 ENCOUNTER — Encounter: Payer: Self-pay | Admitting: Family Medicine

## 2021-06-07 ENCOUNTER — Other Ambulatory Visit: Payer: Self-pay

## 2021-06-07 ENCOUNTER — Ambulatory Visit: Payer: Medicare Other | Admitting: Family Medicine

## 2021-06-07 VITALS — BP 134/81 | HR 73 | Ht 65.0 in | Wt 151.0 lb

## 2021-06-07 DIAGNOSIS — R410 Disorientation, unspecified: Secondary | ICD-10-CM

## 2021-06-07 DIAGNOSIS — R413 Other amnesia: Secondary | ICD-10-CM

## 2021-06-07 DIAGNOSIS — Z79899 Other long term (current) drug therapy: Secondary | ICD-10-CM | POA: Diagnosis not present

## 2021-06-07 DIAGNOSIS — Z9181 History of falling: Secondary | ICD-10-CM

## 2021-06-07 NOTE — Progress Notes (Signed)
Chief Complaint  Patient presents with   Follow-up    Rm 16, alone. Here to [place] for memory loss. Pt reports doing well. Pt reports 2 falls while at the senior facility. Pts medication was managed while staying there for 2 weeks. MMSE 21    HISTORY OF PRESENT ILLNESS:  06/07/21 ALL:  Nancy Suarez is a 87 y.o. female here today for follow up for memory loss. She was last seen by Dr Rexene Alberts 01/2021. PCP had decreased Ambien dose to 64m QHS and decreased gabapentin dose. She continued hydrocodone twice daily, Remeron 171mQHS and lexapro 1065maily. Dr AthRexene Albertss hesitant to start Aricept due to low heart rate and opted to start low dose Namenda. She was advised to start 5mg46mily for 1 month then increase to BID dosing. Her daughter called to report behavioral changes 03/10/2021. She was advised to stop memantine. Daughter called again 03/30/2021 with acute behavioral concerns and advised to seek eval with PCP for possible infection. She was hospitalized for UTI 04/13/2021. At discharge, Ambien was discontinued. She was encouraged to continue weaning Remeron and Percocet (switched from hydrocodone? By PCP). She reports being completely weaned off Percocet. She continues Remeron 15mg53m and escitalopram 10mg 60my. She was placed in resite care at [facility] weeks following discharge. She reports having two falls while there. She is using a walker. She has been participating in PT for the past 3 weeks. She lives with her daughter, Nancy Suarez.Nancy Fredericksonreports that Nancy Suarez her take a bath. She reports her appetite is really good. She is sleeping well. She does not drive.    HISTORY (copied from Dr Athar'Guadelupe Sabinous note)  Nancy Suarez 89 yea15old right-handed woman with an underlying medical history of reflux disease, goiter, hypertension, hyperlipidemia, osteopenia, depression, pancreatitis, spinal arthritis, scoliosis, anemia, chronic kidney disease, anxiety, joint pain and insomnia, who presents for FU  consultation of her memory loss. The patient is accompanied by her daughter, Nancy Suarez,Nancy Fredericksonn today.  I first met her on 09/06/20 at the request of her PCP, at which time she was reported to have forgetfulness for at least 6 months.  Her MMSE was 20/30 at the time. I suggest we proceed with additional blood work and a brain MRI.  Her labs included Hemoglobin A1c, vitamin B6, vitamin D, CRP, rheumatoid factor, B12 and B1.Her rheumatoid factor was positive.  Other test results were benign.  She was notified of the results.We had also talked about medication affecting her cognitive function as she was on multiple medications at the time. She was advised to discuss medication management with her primary care physician. She had a brain MRI without contrast on 09/23/2020 and I reviewed the results:   IMPRESSION: Unremarkable MRI scan of the brain without contrast showing only mild age-related changes of chronic small vessel disease and generalized cerebral atrophy.   She was notified of the test results.   Today, 03/01/21: She reports feeling fairly stable, she does not give much in the way of her own history but reports that she has been on Ambien for many years.  She is reluctant to come off of it.  Daughter reports that patient has been staying with her for nearly 2 years in December of this year.  Patient has had recent mood irritability and frustration.  Daughter reports that her stepson moved in recently and is currently staying with them, he is 25 yea28 old.  Stepdaughter is 28 yea73 old and was also  staying with them temporarily and just a few days ago moved out.  While the stepchildren were staying in their home patient's daughter reports that she took a brief trip with her husband for their anniversary.  While she was gone, patient had more mood irritability and refused to take her medications.  The day.  They came back this past weekend, patient had taken her Ambien and had a fall as she got up shortly after  taking the Ambien.  Thankfully, she did not injure herself.  She did not lose consciousness or hit her head but daughter reports that she looked really dazed and groggy.  Patient's daughter is still concerned about patient's medications.  However, her primary care physician has been able to reduce her medications, she is currently on Ambien 5 mg strength half a pill daily at bedtime.  Gabapentin has been reduced from 300 mg to 100 mg and she currently takes it only once a day.  Hydrocodone is currently twice daily as needed.  She typically does take it twice daily.  She continues to be on Remeron 15 mg at bedtime.  She continues to be on Lexapro 10 mg daily.  Memory function per se is stable with the exception that patient has confusion from time to time per daughter.  Patient lives with Nancy Suarez, [place] husband and their 57 year old daughter.  Per Nancy Suarez, patient is very close to her 37 year old granddaughter but due to recent mood irritability, that relationship has been strained.  Daughter just started school after the summer break, fifth grade.     REVIEW OF SYSTEMS: Out of a complete 14 system review of symptoms, the patient complains only of the following symptoms, hip and knee pain, memory loss and all other reviewed systems are negative.   ALLERGIES: Allergies  Allergen Reactions   Aspirin     Upsets stomach if 358m   Codeine Nausea And Vomiting   Namenda [Memantine]     Agitation, aggression   Sulfa Antibiotics Nausea Only     HOME MEDICATIONS: Outpatient Medications Prior to Visit  Medication Sig Dispense Refill   acetaminophen (TYLENOL) 325 MG tablet Take 2 tablets (650 mg total) by mouth every 4 (four) hours as needed for mild pain ((score 1 to 3) or temp > 100.5). (Patient taking differently: Take 650 mg by mouth daily. Also an additional 6544min the evening as needed for pain.) 30 tablet 0   carboxymethylcellulose (REFRESH PLUS) 0.5 % SOLN Place 1 drop into both eyes as needed  (Dry eyes).     cetirizine (ZYRTEC) 10 MG tablet Take 10 mg by mouth daily.     escitalopram (LEXAPRO) 10 MG tablet Take 10 mg by mouth daily.     famotidine (PEPCID) 20 MG tablet Take 20 mg by mouth 2 (two) times daily.     losartan-hydrochlorothiazide (HYZAAR) 100-25 MG tablet Take 1 tablet by mouth daily.     mirtazapine (REMERON) 15 MG tablet Take 15 mg by mouth at bedtime.     Multiple Vitamin (MULTIVITAMIN WITH MINERALS) TABS tablet Take 1 tablet by mouth daily.     oxyCODONE-acetaminophen (PERCOCET) 7.5-325 MG tablet Take 1 tablet by mouth in the morning and at bedtime.     No facility-administered medications prior to visit.     PAST MEDICAL HISTORY: Past Medical History:  Diagnosis Date   ASCUS (atypical squamous cells of undetermined significance) on Pap smear    Atrophic vaginitis    Chronic insomnia    Depression  GERD (gastroesophageal reflux disease)    Goiter    History of kidney stones    Hypercholesteremia    Hypertension    LGSIL (low grade squamous intraepithelial dysplasia)    Osteopenia    Ovarian cyst    "shrank it"   Pancreatitis    Scoliosis    Spinal arthritis      PAST SURGICAL HISTORY: Past Surgical History:  Procedure Laterality Date   ABDOMINAL HYSTERECTOMY  1977   partial   KIDNEY STONE SURGERY  1990's   2-3 stones x1 surgery   ORIF FEMUR FRACTURE Left 06/09/2017   Procedure: OPEN REDUCTION INTERNAL FIXATION (ORIF) DISTAL FEMUR FRACTURE;  Surgeon: Paralee Cancel, MD;  Location: [facility];  Service: Orthopedics;  Laterality: Left;   TONSILLECTOMY  as teenager   TOTAL KNEE ARTHROPLASTY Left 01/19/2014   Procedure: LEFT TOTAL KNEE ARTHROPLASTY;  Surgeon: Mauri Pole, MD;  Location: [facility];  Service: Orthopedics;  Laterality: Left;   WISDOM TOOTH EXTRACTION       FAMILY HISTORY: Family History  Problem Relation Age of Onset   Hypertension Father    Cancer Father    Cancer Mother        Bone   Cancer Brother      SOCIAL  HISTORY: Social History   Socioeconomic History   Marital status: Married    Spouse name: Not on file   Number of children: Not on file   Years of education: Not on file   Highest education level: Not on file  Occupational History   Not on file  Tobacco Use   Smoking status: Former    Years: 20.00    Types: Cigarettes    Quit date: 07/03/1978    Years since quitting: 42.9   Smokeless tobacco: Former  Scientific laboratory technician Use: Never used  Substance and Sexual Activity   Alcohol use: Yes    Comment: occ   Drug use: No   Sexual activity: Yes    Birth control/protection: Surgical    Comment: hysterectomy  Other Topics Concern   Not on file  Social History Narrative   Not on file   Social Determinants of Health   Financial Resource Strain: Not on file  Food Insecurity: Not on file  Transportation Needs: Not on file  Physical Activity: Not on file  Stress: Not on file  Social Connections: Not on file  Intimate Partner Violence: Not on file     PHYSICAL EXAM  Vitals:   06/07/21 0853  BP: 134/81  Pulse: 73  Weight: 151 lb (68.5 kg)  Height: '5\' 5"'  (1.651 m)   Body mass index is 25.13 kg/m.  Generalized: Well developed, in no acute distress  Cardiology: normal rate and rhythm, no murmur auscultated  Respiratory: clear to auscultation bilaterally    Neurological examination  Mentation: Alert oriented to year, place, most history taking. Follows all commands speech and language fluent Cranial nerve II-XII: Pupils were equal round reactive to light. Extraocular movements were full, visual field were full on confrontational test. Facial sensation and strength were normal. Uvula tongue midline. Head turning and shoulder shrug  were normal and symmetric. Motor: The motor testing reveals 5 over 5 strength of bilateral upper extremities, 4+/5 right lower, 3+/5 left hip flexion. Good symmetric motor tone is noted throughout.  Gait and station: Gait was not assessed, patient  unaccompanied at visit and does not have walker. In wheelchair.    DIAGNOSTIC DATA (LABS, IMAGING, TESTING) - I reviewed  patient records, labs, notes, testing and imaging myself where available.  Lab Results  Component Value Date   WBC 3.9 (L) 04/17/2021   HGB 8.7 (L) 04/17/2021   HCT 27.7 (L) 04/17/2021   MCV 89.9 04/17/2021   PLT 147 (L) 04/17/2021      Component Value Date/Time   NA 137 04/17/2021 0140   K 4.6 04/17/2021 0140   CL 109 04/17/2021 0140   CO2 21 (L) 04/17/2021 0140   GLUCOSE 87 04/17/2021 0140   BUN 14 04/17/2021 0140   CREATININE 1.40 (H) 04/17/2021 0140   CALCIUM 8.9 04/17/2021 0140   PROT 5.6 (L) 04/14/2021 0441   ALBUMIN 3.1 (L) 04/14/2021 0441   AST 22 04/14/2021 0441   ALT 19 04/14/2021 0441   ALKPHOS 73 04/14/2021 0441   BILITOT 0.5 04/14/2021 0441   GFRNONAA 36 (L) 04/17/2021 0140   GFRAA 41 (L) 06/11/2017 0404   No results found for: CHOL, HDL, LDLCALC, LDLDIRECT, TRIG, CHOLHDL Lab Results  Component Value Date   HGBA1C 4.9 09/06/2020   Lab Results  Component Value Date   HUOHFGBM21 115 09/06/2020   No results found for: TSH  MMSE - Mini Mental State Exam 06/07/2021 03/01/2021 09/06/2020  Orientation to time '3 3 4  ' Orientation to Place '5 4 4  ' Registration '3 3 3  ' Attention/ Calculation 1 1 0  Attention/Calculation-comments did WORLD - -  Recall '1 1 2  ' Language- name 2 objects '2 2 2  ' Language- repeat 1 1 0  Language- follow 3 step command '3 3 3  ' Language- read & follow direction '1 1 1  ' Write a sentence '1 1 1  ' Copy design 0 1 0  Total score '21 21 20     ' No flowsheet data found.   ASSESSMENT AND PLAN  87 y.o. year old female  has a past medical history of ASCUS (atypical squamous cells of undetermined significance) on Pap smear, Atrophic vaginitis, Chronic insomnia, Depression, GERD (gastroesophageal reflux disease), Goiter, History of kidney stones, Hypercholesteremia, Hypertension, LGSIL (low grade squamous intraepithelial  dysplasia), Osteopenia, Ovarian cyst, Pancreatitis, Scoliosis, and Spinal arthritis. here with    Memory loss  History of recent fall  Polypharmacy  Nancy Suarez is doing much better. She has completely weaned Percocet and Ambien. She does continue Remeron and escitalopram. MMSE stable from previous visit, 21/30. I have been able to connect with her daughter, Nancy Suarez, via telephone. She will continue to work with PT. Fall precautions and memory compensation strategies reviewed. She will follow up closely with PCP. Avoidance of sedating medicaitons recommended. We may consider memory agents in the future. Could consider formal neurocognitive testing, however, I do not feel it will change course of treatment. Healthy lifestyle habits encouraged. She will follow up with me in 6 months. She and her daughter verbalize understanding and agreement with this plan.    No orders of the defined types were placed in this encounter.    No orders of the defined types were placed in this encounter.   I spent 30 minutes of face-to-face and non-face-to-face time with patient.  This included previsit chart review, lab review, study review, order entry, electronic health record documentation, patient education.      Debbora Presto, MSN, FNP-C 06/07/2021, 9:47 AM  [facility] [address], [place], [place] [address] [phone]

## 2021-06-07 NOTE — Patient Instructions (Signed)
Below is our plan:  We will continue to monitor symptoms. Continue close follow up with Dr Petra Kuba. Avoid sedating medications due to side effects. We can consider sending you to formal neurocognitive testing in the future if needed. I do not feel it will change course of treatment at this time but let me know if symptoms worsen.   Please make sure you are staying well hydrated. I recommend 50-60 ounces daily. Well balanced diet and regular exercise encouraged. Consistent sleep schedule with 6-8 hours recommended.   Please continue follow up with care team as directed.   Follow up with me in 6 months, sooner if needed   You may receive a survey regarding today's visit. I encourage you to leave honest feed back as I do use this information to improve patient care. Thank you for seeing me today!   Management of Memory Problems   There are some general things you can do to help manage your memory problems.  Your memory may not in fact recover, but by using techniques and strategies you will be able to manage your memory difficulties better.   1)  Establish a routine. Try to establish and then stick to a regular routine.  By doing this, you will get used to what to expect and you will reduce the need to rely on your memory.  Also, try to do things at the same time of day, such as taking your medication or checking your calendar first thing in the morning. Think about think that you can do as a part of a regular routine and make a list.  Then enter them into a daily planner to remind you.  This will help you establish a routine.   2)  Organize your environment. Organize your environment so that it is uncluttered.  Decrease visual stimulation.  Place everyday items such as keys or cell phone in the same place every day (ie.  Basket next to front door) Use post it notes with a brief message to yourself (ie. Turn off light, lock the door) Use labels to indicate where things go (ie. Which cupboards  are for food, dishes, etc.) Keep a notepad and pen by the telephone to take messages   3)  Memory Aids A diary or journal/notebook/daily planner Making a list (shopping list, chore list, to do list that needs to be done) Using an alarm as a reminder (kitchen timer or cell phone alarm) Using cell phone to store information (Notes, Calendar, Reminders) Calendar/White board placed in a prominent position Post-it notes   In order for memory aids to be useful, you need to have good habits.  It's no good remembering to make a note in your journal if you don't remember to look in it.  Try setting aside a certain time of day to look in journal.   4)  Improving mood and managing fatigue. There may be other factors that contribute to memory difficulties.  Factors, such as anxiety, depression and tiredness can affect memory. Regular gentle exercise can help improve your mood and give you more energy. Simple relaxation techniques may help relieve symptoms of anxiety Try to get back to completing activities or hobbies you enjoyed doing in the past. Learn to pace yourself through activities to decrease fatigue. Find out about some local support groups where you can share experiences with others. Try and achieve 7-8 hours of sleep at night.

## 2021-06-14 DIAGNOSIS — I509 Heart failure, unspecified: Secondary | ICD-10-CM | POA: Diagnosis not present

## 2021-06-14 DIAGNOSIS — J309 Allergic rhinitis, unspecified: Secondary | ICD-10-CM | POA: Diagnosis not present

## 2021-06-14 DIAGNOSIS — F5104 Psychophysiologic insomnia: Secondary | ICD-10-CM | POA: Diagnosis not present

## 2021-06-14 DIAGNOSIS — E7800 Pure hypercholesterolemia, unspecified: Secondary | ICD-10-CM | POA: Diagnosis not present

## 2021-06-14 DIAGNOSIS — I130 Hypertensive heart and chronic kidney disease with heart failure and stage 1 through stage 4 chronic kidney disease, or unspecified chronic kidney disease: Secondary | ICD-10-CM | POA: Diagnosis not present

## 2021-06-14 DIAGNOSIS — R5381 Other malaise: Secondary | ICD-10-CM | POA: Diagnosis not present

## 2021-06-14 DIAGNOSIS — F32A Depression, unspecified: Secondary | ICD-10-CM | POA: Diagnosis not present

## 2021-06-14 DIAGNOSIS — N1832 Chronic kidney disease, stage 3b: Secondary | ICD-10-CM | POA: Diagnosis not present

## 2021-06-14 DIAGNOSIS — Q820 Hereditary lymphedema: Secondary | ICD-10-CM | POA: Diagnosis not present

## 2021-06-14 DIAGNOSIS — M419 Scoliosis, unspecified: Secondary | ICD-10-CM | POA: Diagnosis not present

## 2021-06-14 DIAGNOSIS — K219 Gastro-esophageal reflux disease without esophagitis: Secondary | ICD-10-CM | POA: Diagnosis not present

## 2021-06-14 DIAGNOSIS — M1990 Unspecified osteoarthritis, unspecified site: Secondary | ICD-10-CM | POA: Diagnosis not present

## 2021-06-28 ENCOUNTER — Other Ambulatory Visit: Payer: Self-pay | Admitting: Neurology

## 2021-06-28 DIAGNOSIS — R413 Other amnesia: Secondary | ICD-10-CM

## 2021-07-12 DIAGNOSIS — R8281 Pyuria: Secondary | ICD-10-CM | POA: Diagnosis not present

## 2021-07-14 DIAGNOSIS — I509 Heart failure, unspecified: Secondary | ICD-10-CM | POA: Diagnosis not present

## 2021-07-14 DIAGNOSIS — F32A Depression, unspecified: Secondary | ICD-10-CM | POA: Diagnosis not present

## 2021-07-14 DIAGNOSIS — Q820 Hereditary lymphedema: Secondary | ICD-10-CM | POA: Diagnosis not present

## 2021-07-14 DIAGNOSIS — N1832 Chronic kidney disease, stage 3b: Secondary | ICD-10-CM | POA: Diagnosis not present

## 2021-07-14 DIAGNOSIS — I130 Hypertensive heart and chronic kidney disease with heart failure and stage 1 through stage 4 chronic kidney disease, or unspecified chronic kidney disease: Secondary | ICD-10-CM | POA: Diagnosis not present

## 2021-07-14 DIAGNOSIS — R5381 Other malaise: Secondary | ICD-10-CM | POA: Diagnosis not present

## 2021-07-14 DIAGNOSIS — K219 Gastro-esophageal reflux disease without esophagitis: Secondary | ICD-10-CM | POA: Diagnosis not present

## 2021-07-14 DIAGNOSIS — M419 Scoliosis, unspecified: Secondary | ICD-10-CM | POA: Diagnosis not present

## 2021-07-14 DIAGNOSIS — E7800 Pure hypercholesterolemia, unspecified: Secondary | ICD-10-CM | POA: Diagnosis not present

## 2021-07-14 DIAGNOSIS — M1990 Unspecified osteoarthritis, unspecified site: Secondary | ICD-10-CM | POA: Diagnosis not present

## 2021-07-14 DIAGNOSIS — J309 Allergic rhinitis, unspecified: Secondary | ICD-10-CM | POA: Diagnosis not present

## 2021-07-14 DIAGNOSIS — F5104 Psychophysiologic insomnia: Secondary | ICD-10-CM | POA: Diagnosis not present

## 2021-07-25 DIAGNOSIS — Z1231 Encounter for screening mammogram for malignant neoplasm of breast: Secondary | ICD-10-CM | POA: Diagnosis not present

## 2021-07-25 DIAGNOSIS — Z1211 Encounter for screening for malignant neoplasm of colon: Secondary | ICD-10-CM | POA: Diagnosis not present

## 2021-07-25 DIAGNOSIS — Z01419 Encounter for gynecological examination (general) (routine) without abnormal findings: Secondary | ICD-10-CM | POA: Diagnosis not present

## 2021-07-25 DIAGNOSIS — M8580 Other specified disorders of bone density and structure, unspecified site: Secondary | ICD-10-CM | POA: Diagnosis not present

## 2021-08-01 DIAGNOSIS — H21562 Pupillary abnormality, left eye: Secondary | ICD-10-CM | POA: Diagnosis not present

## 2021-08-01 DIAGNOSIS — H268 Other specified cataract: Secondary | ICD-10-CM | POA: Diagnosis not present

## 2021-08-01 DIAGNOSIS — H25812 Combined forms of age-related cataract, left eye: Secondary | ICD-10-CM | POA: Diagnosis not present

## 2021-08-09 DIAGNOSIS — Q820 Hereditary lymphedema: Secondary | ICD-10-CM | POA: Diagnosis not present

## 2021-08-09 DIAGNOSIS — Z79891 Long term (current) use of opiate analgesic: Secondary | ICD-10-CM | POA: Diagnosis not present

## 2021-08-09 DIAGNOSIS — M2550 Pain in unspecified joint: Secondary | ICD-10-CM | POA: Diagnosis not present

## 2021-08-09 DIAGNOSIS — Z6824 Body mass index (BMI) 24.0-24.9, adult: Secondary | ICD-10-CM | POA: Diagnosis not present

## 2021-08-09 DIAGNOSIS — I119 Hypertensive heart disease without heart failure: Secondary | ICD-10-CM | POA: Diagnosis not present

## 2021-08-09 DIAGNOSIS — G4700 Insomnia, unspecified: Secondary | ICD-10-CM | POA: Diagnosis not present

## 2021-08-09 DIAGNOSIS — Z79899 Other long term (current) drug therapy: Secondary | ICD-10-CM | POA: Diagnosis not present

## 2021-08-09 DIAGNOSIS — M899 Disorder of bone, unspecified: Secondary | ICD-10-CM | POA: Diagnosis not present

## 2021-08-13 DIAGNOSIS — J309 Allergic rhinitis, unspecified: Secondary | ICD-10-CM | POA: Diagnosis not present

## 2021-08-13 DIAGNOSIS — I130 Hypertensive heart and chronic kidney disease with heart failure and stage 1 through stage 4 chronic kidney disease, or unspecified chronic kidney disease: Secondary | ICD-10-CM | POA: Diagnosis not present

## 2021-08-13 DIAGNOSIS — F32A Depression, unspecified: Secondary | ICD-10-CM | POA: Diagnosis not present

## 2021-08-13 DIAGNOSIS — E7800 Pure hypercholesterolemia, unspecified: Secondary | ICD-10-CM | POA: Diagnosis not present

## 2021-08-13 DIAGNOSIS — Q820 Hereditary lymphedema: Secondary | ICD-10-CM | POA: Diagnosis not present

## 2021-08-13 DIAGNOSIS — K219 Gastro-esophageal reflux disease without esophagitis: Secondary | ICD-10-CM | POA: Diagnosis not present

## 2021-08-13 DIAGNOSIS — F5104 Psychophysiologic insomnia: Secondary | ICD-10-CM | POA: Diagnosis not present

## 2021-08-13 DIAGNOSIS — I509 Heart failure, unspecified: Secondary | ICD-10-CM | POA: Diagnosis not present

## 2021-08-13 DIAGNOSIS — N1832 Chronic kidney disease, stage 3b: Secondary | ICD-10-CM | POA: Diagnosis not present

## 2021-08-13 DIAGNOSIS — R5381 Other malaise: Secondary | ICD-10-CM | POA: Diagnosis not present

## 2021-08-13 DIAGNOSIS — M1990 Unspecified osteoarthritis, unspecified site: Secondary | ICD-10-CM | POA: Diagnosis not present

## 2021-08-13 DIAGNOSIS — M419 Scoliosis, unspecified: Secondary | ICD-10-CM | POA: Diagnosis not present

## 2021-10-18 DIAGNOSIS — Z6824 Body mass index (BMI) 24.0-24.9, adult: Secondary | ICD-10-CM | POA: Diagnosis not present

## 2021-10-18 DIAGNOSIS — R053 Chronic cough: Secondary | ICD-10-CM | POA: Diagnosis not present

## 2021-10-18 DIAGNOSIS — Q820 Hereditary lymphedema: Secondary | ICD-10-CM | POA: Diagnosis not present

## 2021-10-18 DIAGNOSIS — M2550 Pain in unspecified joint: Secondary | ICD-10-CM | POA: Diagnosis not present

## 2021-11-03 DIAGNOSIS — Z79891 Long term (current) use of opiate analgesic: Secondary | ICD-10-CM | POA: Diagnosis not present

## 2021-11-27 ENCOUNTER — Other Ambulatory Visit: Payer: Self-pay

## 2021-11-27 ENCOUNTER — Inpatient Hospital Stay (HOSPITAL_COMMUNITY): Payer: Medicare Other

## 2021-11-27 ENCOUNTER — Emergency Department (HOSPITAL_COMMUNITY): Payer: Medicare Other

## 2021-11-27 ENCOUNTER — Encounter (HOSPITAL_COMMUNITY): Payer: Self-pay | Admitting: Pharmacy Technician

## 2021-11-27 ENCOUNTER — Inpatient Hospital Stay (HOSPITAL_COMMUNITY)
Admission: EM | Admit: 2021-11-27 | Discharge: 2021-12-05 | DRG: 481 | Disposition: A | Discharge status: Skilled Nursing Facility | Payer: Medicare Other | Attending: Internal Medicine | Admitting: Internal Medicine

## 2021-11-27 DIAGNOSIS — W19XXXA Unspecified fall, initial encounter: Secondary | ICD-10-CM | POA: Diagnosis not present

## 2021-11-27 DIAGNOSIS — R41841 Cognitive communication deficit: Secondary | ICD-10-CM | POA: Diagnosis not present

## 2021-11-27 DIAGNOSIS — D62 Acute posthemorrhagic anemia: Secondary | ICD-10-CM | POA: Diagnosis not present

## 2021-11-27 DIAGNOSIS — Y92012 Bathroom of single-family (private) house as the place of occurrence of the external cause: Secondary | ICD-10-CM

## 2021-11-27 DIAGNOSIS — K219 Gastro-esophageal reflux disease without esophagitis: Secondary | ICD-10-CM | POA: Diagnosis present

## 2021-11-27 DIAGNOSIS — Z888 Allergy status to other drugs, medicaments and biological substances status: Secondary | ICD-10-CM | POA: Diagnosis not present

## 2021-11-27 DIAGNOSIS — N179 Acute kidney failure, unspecified: Secondary | ICD-10-CM | POA: Diagnosis not present

## 2021-11-27 DIAGNOSIS — Z882 Allergy status to sulfonamides status: Secondary | ICD-10-CM

## 2021-11-27 DIAGNOSIS — D631 Anemia in chronic kidney disease: Secondary | ICD-10-CM | POA: Diagnosis not present

## 2021-11-27 DIAGNOSIS — F32A Depression, unspecified: Secondary | ICD-10-CM | POA: Diagnosis present

## 2021-11-27 DIAGNOSIS — Z886 Allergy status to analgesic agent status: Secondary | ICD-10-CM

## 2021-11-27 DIAGNOSIS — N170 Acute kidney failure with tubular necrosis: Secondary | ICD-10-CM | POA: Diagnosis not present

## 2021-11-27 DIAGNOSIS — I10 Essential (primary) hypertension: Secondary | ICD-10-CM | POA: Diagnosis present

## 2021-11-27 DIAGNOSIS — Z96652 Presence of left artificial knee joint: Secondary | ICD-10-CM | POA: Diagnosis present

## 2021-11-27 DIAGNOSIS — I129 Hypertensive chronic kidney disease with stage 1 through stage 4 chronic kidney disease, or unspecified chronic kidney disease: Secondary | ICD-10-CM | POA: Diagnosis not present

## 2021-11-27 DIAGNOSIS — S72141D Displaced intertrochanteric fracture of right femur, subsequent encounter for closed fracture with routine healing: Secondary | ICD-10-CM | POA: Diagnosis not present

## 2021-11-27 DIAGNOSIS — W010XXA Fall on same level from slipping, tripping and stumbling without subsequent striking against object, initial encounter: Secondary | ICD-10-CM | POA: Diagnosis present

## 2021-11-27 DIAGNOSIS — F0390 Unspecified dementia without behavioral disturbance: Secondary | ICD-10-CM | POA: Diagnosis not present

## 2021-11-27 DIAGNOSIS — Z79899 Other long term (current) drug therapy: Secondary | ICD-10-CM

## 2021-11-27 DIAGNOSIS — F05 Delirium due to known physiological condition: Secondary | ICD-10-CM | POA: Diagnosis present

## 2021-11-27 DIAGNOSIS — D649 Anemia, unspecified: Secondary | ICD-10-CM | POA: Diagnosis not present

## 2021-11-27 DIAGNOSIS — M25551 Pain in right hip: Secondary | ICD-10-CM | POA: Diagnosis not present

## 2021-11-27 DIAGNOSIS — Z87442 Personal history of urinary calculi: Secondary | ICD-10-CM | POA: Diagnosis not present

## 2021-11-27 DIAGNOSIS — S72009A Fracture of unspecified part of neck of unspecified femur, initial encounter for closed fracture: Secondary | ICD-10-CM | POA: Diagnosis present

## 2021-11-27 DIAGNOSIS — Y92009 Unspecified place in unspecified non-institutional (private) residence as the place of occurrence of the external cause: Secondary | ICD-10-CM | POA: Diagnosis present

## 2021-11-27 DIAGNOSIS — S72001A Fracture of unspecified part of neck of right femur, initial encounter for closed fracture: Secondary | ICD-10-CM | POA: Diagnosis not present

## 2021-11-27 DIAGNOSIS — Z885 Allergy status to narcotic agent status: Secondary | ICD-10-CM | POA: Diagnosis not present

## 2021-11-27 DIAGNOSIS — Z8249 Family history of ischemic heart disease and other diseases of the circulatory system: Secondary | ICD-10-CM

## 2021-11-27 DIAGNOSIS — E785 Hyperlipidemia, unspecified: Secondary | ICD-10-CM | POA: Diagnosis present

## 2021-11-27 DIAGNOSIS — R059 Cough, unspecified: Secondary | ICD-10-CM | POA: Diagnosis not present

## 2021-11-27 DIAGNOSIS — Z87891 Personal history of nicotine dependence: Secondary | ICD-10-CM

## 2021-11-27 DIAGNOSIS — M6281 Muscle weakness (generalized): Secondary | ICD-10-CM | POA: Diagnosis not present

## 2021-11-27 DIAGNOSIS — G3184 Mild cognitive impairment, so stated: Secondary | ICD-10-CM | POA: Diagnosis present

## 2021-11-27 DIAGNOSIS — Z781 Physical restraint status: Secondary | ICD-10-CM | POA: Diagnosis not present

## 2021-11-27 DIAGNOSIS — M25561 Pain in right knee: Secondary | ICD-10-CM | POA: Diagnosis not present

## 2021-11-27 DIAGNOSIS — N1832 Chronic kidney disease, stage 3b: Secondary | ICD-10-CM | POA: Diagnosis not present

## 2021-11-27 DIAGNOSIS — E7800 Pure hypercholesterolemia, unspecified: Secondary | ICD-10-CM | POA: Diagnosis present

## 2021-11-27 DIAGNOSIS — E049 Nontoxic goiter, unspecified: Secondary | ICD-10-CM | POA: Diagnosis not present

## 2021-11-27 DIAGNOSIS — N281 Cyst of kidney, acquired: Secondary | ICD-10-CM | POA: Diagnosis not present

## 2021-11-27 DIAGNOSIS — Z4789 Encounter for other orthopedic aftercare: Secondary | ICD-10-CM | POA: Diagnosis not present

## 2021-11-27 DIAGNOSIS — S72141A Displaced intertrochanteric fracture of right femur, initial encounter for closed fracture: Secondary | ICD-10-CM | POA: Diagnosis not present

## 2021-11-27 DIAGNOSIS — Z9181 History of falling: Secondary | ICD-10-CM | POA: Diagnosis not present

## 2021-11-27 DIAGNOSIS — M419 Scoliosis, unspecified: Secondary | ICD-10-CM | POA: Diagnosis not present

## 2021-11-27 DIAGNOSIS — M8580 Other specified disorders of bone density and structure, unspecified site: Secondary | ICD-10-CM | POA: Diagnosis not present

## 2021-11-27 DIAGNOSIS — F339 Major depressive disorder, recurrent, unspecified: Secondary | ICD-10-CM | POA: Diagnosis not present

## 2021-11-27 DIAGNOSIS — R2681 Unsteadiness on feet: Secondary | ICD-10-CM | POA: Diagnosis not present

## 2021-11-27 DIAGNOSIS — M479 Spondylosis, unspecified: Secondary | ICD-10-CM | POA: Diagnosis not present

## 2021-11-27 LAB — BASIC METABOLIC PANEL
Anion gap: 3 — ABNORMAL LOW (ref 5–15)
BUN: 38 mg/dL — ABNORMAL HIGH (ref 8–23)
CO2: 25 mmol/L (ref 22–32)
Calcium: 9.2 mg/dL (ref 8.9–10.3)
Chloride: 115 mmol/L — ABNORMAL HIGH (ref 98–111)
Creatinine, Ser: 2.2 mg/dL — ABNORMAL HIGH (ref 0.44–1.00)
GFR, Estimated: 21 mL/min — ABNORMAL LOW (ref >60)
Glucose, Bld: 113 mg/dL — ABNORMAL HIGH (ref 70–99)
Potassium: 4.8 mmol/L (ref 3.5–5.1)
Sodium: 143 mmol/L (ref 135–145)

## 2021-11-27 LAB — CBC WITH DIFFERENTIAL/PLATELET
Abs Immature Granulocytes: 0.06 10*3/uL (ref 0.00–0.07)
Basophils Absolute: 0 10*3/uL (ref 0.0–0.1)
Basophils Relative: 0 %
Eosinophils Absolute: 0 10*3/uL (ref 0.0–0.5)
Eosinophils Relative: 0 %
HCT: 26 % — ABNORMAL LOW (ref 36.0–46.0)
Hemoglobin: 8.2 g/dL — ABNORMAL LOW (ref 12.0–15.0)
Immature Granulocytes: 1 %
Lymphocytes Relative: 9 %
Lymphs Abs: 0.7 10*3/uL (ref 0.7–4.0)
MCH: 29.4 pg (ref 26.0–34.0)
MCHC: 31.5 g/dL (ref 30.0–36.0)
MCV: 93.2 fL (ref 80.0–100.0)
Monocytes Absolute: 0.7 10*3/uL (ref 0.1–1.0)
Monocytes Relative: 9 %
Neutro Abs: 6.7 10*3/uL (ref 1.7–7.7)
Neutrophils Relative %: 81 %
Platelets: 187 10*3/uL (ref 150–400)
RBC: 2.79 MIL/uL — ABNORMAL LOW (ref 3.87–5.11)
RDW: 14.2 % (ref 11.5–15.5)
WBC: 8.2 10*3/uL (ref 4.0–10.5)
nRBC: 0 % (ref 0.0–0.2)

## 2021-11-27 LAB — PROTIME-INR
INR: 1.2 (ref 0.8–1.2)
Prothrombin Time: 14.6 seconds (ref 11.4–15.2)

## 2021-11-27 LAB — PREPARE RBC (CROSSMATCH)

## 2021-11-27 MED ORDER — ADULT MULTIVITAMIN W/MINERALS CH
1.0000 | ORAL_TABLET | Freq: Every day | ORAL | Status: DC
  Administered 2021-11-29 – 2021-12-05 (×7): 1 via ORAL
  Filled 2021-11-27 (×7): qty 1

## 2021-11-27 MED ORDER — PANTOPRAZOLE SODIUM 40 MG PO TBEC
40.0000 mg | DELAYED_RELEASE_TABLET | Freq: Every day | ORAL | Status: DC
  Administered 2021-11-29 – 2021-12-05 (×7): 40 mg via ORAL
  Filled 2021-11-27 (×7): qty 1

## 2021-11-27 MED ORDER — LORAZEPAM 2 MG/ML IJ SOLN
0.5000 mg | Freq: Two times a day (BID) | INTRAMUSCULAR | Status: DC | PRN
  Administered 2021-11-27: 0.5 mg via INTRAVENOUS
  Filled 2021-11-27: qty 1

## 2021-11-27 MED ORDER — HYDROMORPHONE HCL 1 MG/ML IJ SOLN
0.5000 mg | INTRAMUSCULAR | Status: DC | PRN
  Administered 2021-11-27: 0.5 mg via INTRAVENOUS
  Filled 2021-11-27: qty 1

## 2021-11-27 MED ORDER — POLYVINYL ALCOHOL 1.4 % OP SOLN
1.0000 [drp] | Freq: Three times a day (TID) | OPHTHALMIC | Status: DC | PRN
  Administered 2021-11-29: 1 [drp] via OPHTHALMIC
  Filled 2021-11-27 (×2): qty 15

## 2021-11-27 MED ORDER — MIRTAZAPINE 15 MG PO TABS
15.0000 mg | ORAL_TABLET | Freq: Every day | ORAL | Status: DC
  Administered 2021-11-28 – 2021-12-04 (×7): 15 mg via ORAL
  Filled 2021-11-27 (×8): qty 1

## 2021-11-27 MED ORDER — LACTATED RINGERS IV SOLN: INTRAVENOUS | Status: DC

## 2021-11-27 MED ORDER — ESCITALOPRAM OXALATE 10 MG PO TABS
10.0000 mg | ORAL_TABLET | Freq: Every day | ORAL | Status: DC
  Administered 2021-11-29 – 2021-12-05 (×7): 10 mg via ORAL
  Filled 2021-11-27 (×7): qty 1

## 2021-11-27 MED ORDER — CARBOXYMETHYLCELLULOSE SODIUM 0.5 % OP SOLN: 1.0000 [drp] | Freq: Three times a day (TID) | OPHTHALMIC | Status: DC | PRN

## 2021-11-27 MED ORDER — ADULT MULTIVITAMIN W/MINERALS CH: 1.0000 | ORAL_TABLET | Freq: Every day | ORAL | Status: DC

## 2021-11-27 MED ORDER — SODIUM CHLORIDE 0.9% IV SOLUTION: Freq: Once | INTRAVENOUS | Status: DC

## 2021-11-27 MED ORDER — HYDRALAZINE HCL 20 MG/ML IJ SOLN
10.0000 mg | Freq: Three times a day (TID) | INTRAMUSCULAR | Status: DC | PRN
Start: 2021-11-27 — End: 2021-12-02

## 2021-11-27 MED ORDER — FENTANYL CITRATE PF 50 MCG/ML IJ SOSY
12.5000 ug | PREFILLED_SYRINGE | Freq: Once | INTRAMUSCULAR | Status: AC | PRN
  Administered 2021-11-27: 12.5 ug via INTRAVENOUS
  Filled 2021-11-27: qty 1

## 2021-11-27 MED ORDER — MELATONIN 10 MG PO TABS: 10.0000 mg | ORAL_TABLET | Freq: Every day | ORAL | Status: DC

## 2021-11-27 MED ORDER — HALOPERIDOL LACTATE 5 MG/ML IJ SOLN
1.0000 mg | Freq: Four times a day (QID) | INTRAMUSCULAR | Status: DC | PRN
  Administered 2021-11-27 – 2021-11-30 (×2): 1 mg via INTRAMUSCULAR
  Filled 2021-11-27 (×2): qty 1

## 2021-11-27 MED ORDER — ACETAMINOPHEN 325 MG PO TABS
650.0000 mg | ORAL_TABLET | Freq: Four times a day (QID) | ORAL | Status: DC | PRN
  Filled 2021-11-27: qty 2

## 2021-11-27 MED ORDER — MELATONIN 5 MG PO TABS
10.0000 mg | ORAL_TABLET | Freq: Every day | ORAL | Status: DC
  Administered 2021-11-28 – 2021-12-04 (×7): 10 mg via ORAL
  Filled 2021-11-27 (×9): qty 2

## 2021-11-27 MED ORDER — ATORVASTATIN CALCIUM 20 MG PO TABS: 20.0000 mg | ORAL_TABLET | Freq: Every day | ORAL | Status: DC

## 2021-11-27 MED ORDER — CHLORHEXIDINE GLUCONATE 4 % EX LIQD
60.0000 mL | Freq: Once | CUTANEOUS | Status: AC
  Administered 2021-11-28: 4 via TOPICAL
  Filled 2021-11-27: qty 60

## 2021-11-27 NOTE — Anesthesia Preprocedure Evaluation (Addendum)
Anesthesia Evaluation  Patient identified by MRN, date of birth, ID band Patient awake and Patient confused    Reviewed: Allergy & Precautions, NPO status , Patient's Chart, lab work & pertinent test results, reviewed documented beta blocker date and time   Airway Mallampati: II  TM Distance: >3 FB Neck ROM: Full    Dental  (+) Dental Advisory Given,    Pulmonary former smoker,    Pulmonary exam normal breath sounds clear to auscultation       Cardiovascular hypertension, Pt. on medications (-) PND Normal cardiovascular exam Rhythm:Regular Rate:Normal     Neuro/Psych PSYCHIATRIC DISORDERS Depression Dementia negative neurological ROS     GI/Hepatic Neg liver ROS, GERD  Medicated and Controlled,  Endo/Other  Hyperlipidemia   Renal/GU Renal InsufficiencyRenal diseaseHx/o renal calculi  negative genitourinary   Musculoskeletal  (+) Arthritis , Osteoarthritis,  Intertrochanteric left hip Fx   Abdominal   Peds  Hematology  (+) Blood dyscrasia, anemia ,   Anesthesia Other Findings   Reproductive/Obstetrics                          Anesthesia Physical Anesthesia Plan  ASA: 3 and emergent  Anesthesia Plan: General   Post-op Pain Management: Ofirmev IV (intra-op)* and Dilaudid IV   Induction: Intravenous  PONV Risk Score and Plan: 4 or greater and Treatment may vary due to age or medical condition and Ondansetron  Airway Management Planned: Oral ETT  Additional Equipment: None  Intra-op Plan:   Post-operative Plan: Extubation in OR  Informed Consent: I have reviewed the patients History and Physical, chart, labs and discussed the procedure including the risks, benefits and alternatives for the proposed anesthesia with the patient or authorized representative who has indicated his/her understanding and acceptance.     Dental advisory given, Consent reviewed with POA and History  available from chart only  Plan Discussed with: Anesthesiologist and CRNA  Anesthesia Plan Comments:      Anesthesia Quick Evaluation

## 2021-11-27 NOTE — Progress Notes (Signed)
Asked to provide surgical care by Dr. Shelle Iron. X-rays reviewed. Patient needs IM nail R femur. Plan for surgery tomorrow. NPO after MN tonight. Hold chemical DVT ppx. Hgb 8.2 today- I ordered 1 unit PRBCs to get her ready for surgery.

## 2021-11-27 NOTE — Progress Notes (Signed)
2100: PT becoming progressively more confused and paranoid. Unable to console. PT crying and stating "we are lying to her" PT removed IV and tele cords. Tele replaced but patient refusing to allow RN or IV team to place another IV. Safety mitts are on. PT yelling at staff. Doctor made aware of no IV access and patient refusing to allow staff to place another one. Daughter, Byrd Hesselbach, called and made aware of situation. Daughter spoke to patient on room phone.   2230: With safety mitts on patient throwing drinks into hallway and beating room phone against side rail. PT yelling out and swinging and hitting at staff. Doctor made aware and IM haldol and soft wrist restraints ordered.

## 2021-11-27 NOTE — H&P (View-Only) (Signed)
Reason for Consult:Right hip fracture Referring Physician: EDP  Delisha Patil Weathersby is an 88 y.o. female.  HPI: 88yo female fell this am. Noted right hip fracture in ED.  Past Medical History:  Diagnosis Date   ASCUS (atypical squamous cells of undetermined significance) on Pap smear    Atrophic vaginitis    Chronic insomnia    Depression    GERD (gastroesophageal reflux disease)    Goiter    History of kidney stones    Hypercholesteremia    Hypertension    LGSIL (low grade squamous intraepithelial dysplasia)    Osteopenia    Ovarian cyst    "shrank it"   Pancreatitis    Scoliosis    Spinal arthritis     Past Surgical History:  Procedure Laterality Date   ABDOMINAL HYSTERECTOMY  1977   partial   KIDNEY STONE SURGERY  1990's   2-3 stones x1 surgery   ORIF FEMUR FRACTURE Left 06/09/2017   Procedure: OPEN REDUCTION INTERNAL FIXATION (ORIF) DISTAL FEMUR FRACTURE;  Surgeon: Paralee Cancel, MD;  Location: [facility];  Service: Orthopedics;  Laterality: Left;   TONSILLECTOMY  as teenager   TOTAL KNEE ARTHROPLASTY Left 01/19/2014   Procedure: LEFT TOTAL KNEE ARTHROPLASTY;  Surgeon: Mauri Pole, MD;  Location: [facility];  Service: Orthopedics;  Laterality: Left;   WISDOM TOOTH EXTRACTION      Family History  Problem Relation Age of Onset   Hypertension Father    Cancer Father    Cancer Mother        Bone   Cancer Brother     Social History:  reports that she quit smoking about 43 years ago. Her smoking use included cigarettes. She has quit using smokeless tobacco. She reports current alcohol use. She reports that she does not use drugs.  Allergies:  Allergies  Allergen Reactions   Ambien [Zolpidem] Other (See Comments)    Disoriented, aggressive   Aspirin     Upsets stomach if 325mg    Codeine Nausea And Vomiting   Namenda [Memantine]     Agitation, aggression   Percocet [Oxycodone-Acetaminophen] Other (See Comments)    Disoriented, aggressive   Sulfa Antibiotics Nausea Only     Medications: I have reviewed the patient's current medications.  Results for orders placed or performed during the hospital encounter of 11/27/21 (from the past 48 hour(s))  Basic metabolic panel     Status: Abnormal   Collection Time: 11/27/21 11:11 AM  Result Value Ref Range   Sodium 143 135 - 145 mmol/L   Potassium 4.8 3.5 - 5.1 mmol/L   Chloride 115 (H) 98 - 111 mmol/L   CO2 25 22 - 32 mmol/L   Glucose, Bld 113 (H) 70 - 99 mg/dL    Comment: Glucose reference range applies only to samples taken after fasting for at least 8 hours.   BUN 38 (H) 8 - 23 mg/dL   Creatinine, Ser 2.20 (H) 0.44 - 1.00 mg/dL   Calcium 9.2 8.9 - 10.3 mg/dL   GFR, Estimated 21 (L) >60 mL/min    Comment: (NOTE) Calculated using the CKD-EPI Creatinine Equation (2021)    Anion gap 3 (L) 5 - 15    Comment: Performed at [facility], [place] [address] [place], [place] [address]  CBC with Differential     Status: Abnormal   Collection Time: 11/27/21 11:11 AM  Result Value Ref Range   WBC 8.2 4.0 - 10.5 K/uL   RBC 2.79 (L) 3.87 - 5.11  MIL/uL   Hemoglobin 8.2 (L) 12.0 - 15.0 g/dL   HCT 26.0 (L) 36.0 - 46.0 %   MCV 93.2 80.0 - 100.0 fL   MCH 29.4 26.0 - 34.0 pg   MCHC 31.5 30.0 - 36.0 g/dL   RDW 14.2 11.5 - 15.5 %   Platelets 187 150 - 400 K/uL   nRBC 0.0 0.0 - 0.2 %   Neutrophils Relative % 81 %   Neutro Abs 6.7 1.7 - 7.7 K/uL   Lymphocytes Relative 9 %   Lymphs Abs 0.7 0.7 - 4.0 K/uL   Monocytes Relative 9 %   Monocytes Absolute 0.7 0.1 - 1.0 K/uL   Eosinophils Relative 0 %   Eosinophils Absolute 0.0 0.0 - 0.5 K/uL   Basophils Relative 0 %   Basophils Absolute 0.0 0.0 - 0.1 K/uL   Immature Granulocytes 1 %   Abs Immature Granulocytes 0.06 0.00 - 0.07 K/uL    Comment: Performed at [facility], [place] [address] [place], [place] [address]  Protime-INR     Status: None   Collection Time: 11/27/21 11:11 AM  Result Value Ref Range   Prothrombin Time 14.6 11.4 -  15.2 seconds   INR 1.2 0.8 - 1.2    Comment: (NOTE) INR goal varies based on device and disease states. Performed at [facility], [place] [address] [place], [place] [address]     DG Knee Complete 4 Views Right  Result Date: 11/27/2021 CLINICAL DATA:  [place] in the bathroom today.  Right knee pain. EXAM: RIGHT KNEE - COMPLETE 4+ VIEW COMPARISON:  None Available. FINDINGS: No fracture or bone lesion. Skeletal structures are diffusely demineralized. Marked narrowing of the medial joint space compartment, milder narrowing of the lateral and patellofemoral joint space compartments. Medial compartment subchondral sclerosis. Marginal osteophytes from all 3 compartments. No joint effusion. Surrounding soft tissues are unremarkable. IMPRESSION: 1. No fracture or acute finding. 2. Advanced osteoarthritis predominantly involving the medial compartment. Electronically Signed   By: Lajean Manes M.D.   On: 11/27/2021 12:08   DG Hip Unilat With Pelvis 2-3 Views Right  Result Date: 11/27/2021 CLINICAL DATA:  Right hip pain after fall in bathroom today. EXAM: DG HIP (WITH OR WITHOUT PELVIS) 2-3V RIGHT COMPARISON:  None Available. FINDINGS: There is an acute and comminuted intertrochanteric fracture involving the proximal right femur. There is been lateral and superior displacement of the distal fracture fragments. No sign of dislocation. Previous ORIF of the left femur. IMPRESSION: Acute and comminuted intertrochanteric fracture of the proximal right femur. Electronically Signed   By: Kerby Moors M.D.   On: 11/27/2021 12:07    Review of Systems  Musculoskeletal:  Positive for joint swelling and myalgias.  Blood pressure (!) 151/128, pulse 84, temperature 97.9 F (36.6 C), temperature source Oral, resp. rate (!) 21, SpO2 100 %. Physical Exam Constitutional:      Appearance: Normal appearance.  HENT:     Head: Normocephalic and atraumatic.  Eyes:     Pupils: Pupils are equal, round, and  reactive to light.  Cardiovascular:     Rate and Rhythm: Normal rate.  Pulmonary:     Effort: Pulmonary effort is normal.  Abdominal:     General: Abdomen is flat.     Palpations: Abdomen is soft.  Musculoskeletal:        General: Tenderness and deformity present.     Comments: Right leg shortened and erternally rotated. Compartments soft. Pain with attempted motion. Neurovascularly intact. No  DVT.  Skin:    General: Skin is warm and dry.     Capillary Refill: Capillary refill takes less than 2 seconds.  Neurological:     General: No focal deficit present.     Mental Status: She is alert and oriented to person, place, and time.  Psychiatric:        Mood and Affect: Mood normal.    Assessment/Plan:  Right intertrochanteric hip fracture.  .Anemia  Plan:  !. IM nailing right femur in am 0730 Dr. Lyla Glassing. Discussed with patient and daughter 2. Type and cross check CBC in am. 3. NPO after midnight   Johnn Hai 11/27/2021, 2:26 PM

## 2021-11-27 NOTE — H&P (Signed)
History and Physical    Patient: Nancy Suarez [id number] DOB: [birth year] DOA: 11/27/2021 DOS: the patient was seen and examined on 11/27/2021 PCP: Vincente Liberty, MD  Patient coming from: Home  Chief Complaint:  Chief Complaint  Patient presents with   Fall   HPI: Nancy Suarez is a 88 y.o. female with medical history significant of HTN, HLD, CKD3b, GERD. Presenting with right hip pain after a fall. She was in her bathroom getting dressed for church the morning. She went to turn around and slipped. She tried to catch herself, but she fell onto her right side. There was no head injury or LOC. She didn't have any CP, palpitations, lightheadedness or dizziness prior to her fall. She was unable to get up on her home. Her daughter heard the fall and came immediately to assist her. They alerted EMS and she was brought to the ED for evaluation. She denies any other aggravating or alleviating factors.    Review of Systems: As mentioned in the history of present illness. All other systems reviewed and are negative. Past Medical History:  Diagnosis Date   ASCUS (atypical squamous cells of undetermined significance) on Pap smear    Atrophic vaginitis    Chronic insomnia    Depression    GERD (gastroesophageal reflux disease)    Goiter    History of kidney stones    Hypercholesteremia    Hypertension    LGSIL (low grade squamous intraepithelial dysplasia)    Osteopenia    Ovarian cyst    "shrank it"   Pancreatitis    Scoliosis    Spinal arthritis    Past Surgical History:  Procedure Laterality Date   ABDOMINAL HYSTERECTOMY  1977   partial   KIDNEY STONE SURGERY  1990's   2-3 stones x1 surgery   ORIF FEMUR FRACTURE Left 06/09/2017   Procedure: OPEN REDUCTION INTERNAL FIXATION (ORIF) DISTAL FEMUR FRACTURE;  Surgeon: Paralee Cancel, MD;  Location: [facility];  Service: Orthopedics;  Laterality: Left;   TONSILLECTOMY  as teenager   TOTAL KNEE ARTHROPLASTY Left 01/19/2014   Procedure:  LEFT TOTAL KNEE ARTHROPLASTY;  Surgeon: Mauri Pole, MD;  Location: [facility];  Service: Orthopedics;  Laterality: Left;   WISDOM TOOTH EXTRACTION     Social History:  reports that she quit smoking about 43 years ago. Her smoking use included cigarettes. She has quit using smokeless tobacco. She reports current alcohol use. She reports that she does not use drugs.  Allergies  Allergen Reactions   Aspirin     Upsets stomach if 325mg    Codeine Nausea And Vomiting   Namenda [Memantine]     Agitation, aggression   Sulfa Antibiotics Nausea Only    Family History  Problem Relation Age of Onset   Hypertension Father    Cancer Father    Cancer Mother        Bone   Cancer Brother     Prior to Admission medications   Medication Sig Start Date End Date Taking? Authorizing Provider  acetaminophen (TYLENOL) 325 MG tablet Take 2 tablets (650 mg total) by mouth every 4 (four) hours as needed for mild pain ((score 1 to 3) or temp > 100.5). Patient taking differently: Take 650 mg by mouth daily. Also an additional 650mg  in the evening as needed for pain. 06/12/17   Robbie Lis, MD  carboxymethylcellulose (REFRESH PLUS) 0.5 % SOLN Place 1 drop into both eyes as needed (Dry eyes).    [provider]  cetirizine (ZYRTEC) 10 MG tablet Take 10 mg by mouth daily.    [provider]  escitalopram (LEXAPRO) 10 MG tablet Take 10 mg by mouth daily.    [provider]  famotidine (PEPCID) 20 MG tablet Take 20 mg by mouth 2 (two) times daily.    [provider]  losartan-hydrochlorothiazide (HYZAAR) 100-25 MG tablet Take 1 tablet by mouth daily.    [provider]  mirtazapine (REMERON) 15 MG tablet Take 15 mg by mouth at bedtime.    [provider]  Multiple Vitamin (MULTIVITAMIN WITH MINERALS) TABS tablet Take 1 tablet by mouth daily.    [provider]    Physical Exam: Vitals:   11/27/21 1100 11/27/21 1115 11/27/21 1130 11/27/21 1236   BP: (!) 156/86  (!) 150/61 (!) 151/128  Pulse: 66 77 66 84  Resp: 11 17 13  (!) 21  Temp:      TempSrc:      SpO2: 100% 99% 100% 100%   General: 88 y.o. female resting in bed in NAD Eyes: PERRL, normal sclera ENMT: Nares patent w/o discharge, orophaynx clear, dentition normal, ears w/o discharge/lesions/ulcers Neck: Supple, trachea midline Cardiovascular: RRR, +S1, S2, no m/g/r, equal pulses throughout Respiratory: CTABL, no w/r/r, normal WOB GI: BS+, NDNT, no masses noted, no organomegaly noted MSK: No e/c/c; R hip limited ROM d/t pain Neuro: A&O x 3, no focal deficits Psyc: Appropriate interaction and affect, calm/cooperative  Data Reviewed:  Na+  143 K+  4.8 BUN  38 Scr  2.20 Hgb  8.2  XR Right hip: Acute and comminuted intertrochanteric fracture of the proximal right femur.  XR Right knee: 1. No fracture or acute finding. 2. Advanced osteoarthritis predominantly involving the medial compartment.  Assessment and Plan: Right hip fracture Fall     - admit to inpt, tele     - XR as above     - [facility] to see     - PT/OT after procedure     - NPOpMN     - pain control  AKI on CKD3b     - check renal US     - hold home BP regimen, will have PRNs in place     - fluids  HTN     - hold home ARB and diuretic d/t AKI     - PRNs available  HLD     - resume home regimen  Depression     - resume home regimen  GERD     - PPI  Normocytic anemia     - at baseline, no evidence of bleed, follow  Advance Care Planning:   Code Status: FULL  Consults: Orthopedics ([facility])  Family Communication: w/ daughter at bedside  Severity of Illness: The appropriate patient status for this patient is INPATIENT. Inpatient status is judged to be reasonable and necessary in order to provide the required intensity of service to ensure the patient's safety. The patient's presenting symptoms, physical exam findings, and initial radiographic and laboratory data in the context  of their chronic comorbidities is felt to place them at high risk for further clinical deterioration. Furthermore, it is not anticipated that the patient will be medically stable for discharge from the hospital within 2 midnights of admission.   * I certify that at the point of admission it is my clinical judgment that the patient will require inpatient hospital care spanning beyond 2 midnights from the point of admission due to high intensity of service, high  risk for further deterioration and high frequency of surveillance required.*  Author: Jonnie Finner, DO 11/27/2021 1:11 PM  For on call review [email or web address].

## 2021-11-27 NOTE — Consult Note (Signed)
Reason for Consult:Right hip fracture Referring Physician: EDP  Nancy Suarez is an 88 y.o. female.  HPI: 88yo female fell this am. Noted right hip fracture in ED.  Past Medical History:  Diagnosis Date   ASCUS (atypical squamous cells of undetermined significance) on Pap smear    Atrophic vaginitis    Chronic insomnia    Depression    GERD (gastroesophageal reflux disease)    Goiter    History of kidney stones    Hypercholesteremia    Hypertension    LGSIL (low grade squamous intraepithelial dysplasia)    Osteopenia    Ovarian cyst    "shrank it"   Pancreatitis    Scoliosis    Spinal arthritis     Past Surgical History:  Procedure Laterality Date   ABDOMINAL HYSTERECTOMY  1977   partial   KIDNEY STONE SURGERY  1990's   2-3 stones x1 surgery   ORIF FEMUR FRACTURE Left 06/09/2017   Procedure: OPEN REDUCTION INTERNAL FIXATION (ORIF) DISTAL FEMUR FRACTURE;  Surgeon: Paralee Cancel, MD;  Location: [facility];  Service: Orthopedics;  Laterality: Left;   TONSILLECTOMY  as teenager   TOTAL KNEE ARTHROPLASTY Left 01/19/2014   Procedure: LEFT TOTAL KNEE ARTHROPLASTY;  Surgeon: Mauri Pole, MD;  Location: [facility];  Service: Orthopedics;  Laterality: Left;   WISDOM TOOTH EXTRACTION      Family History  Problem Relation Age of Onset   Hypertension Father    Cancer Father    Cancer Mother        Bone   Cancer Brother     Social History:  reports that she quit smoking about 43 years ago. Her smoking use included cigarettes. She has quit using smokeless tobacco. She reports current alcohol use. She reports that she does not use drugs.  Allergies:  Allergies  Allergen Reactions   Ambien [Zolpidem] Other (See Comments)    Disoriented, aggressive   Aspirin     Upsets stomach if 325mg    Codeine Nausea And Vomiting   Namenda [Memantine]     Agitation, aggression   Percocet [Oxycodone-Acetaminophen] Other (See Comments)    Disoriented, aggressive   Sulfa Antibiotics Nausea Only     Medications: I have reviewed the patient's current medications.  Results for orders placed or performed during the hospital encounter of 11/27/21 (from the past 48 hour(s))  Basic metabolic panel     Status: Abnormal   Collection Time: 11/27/21 11:11 AM  Result Value Ref Range   Sodium 143 135 - 145 mmol/L   Potassium 4.8 3.5 - 5.1 mmol/L   Chloride 115 (H) 98 - 111 mmol/L   CO2 25 22 - 32 mmol/L   Glucose, Bld 113 (H) 70 - 99 mg/dL    Comment: Glucose reference range applies only to samples taken after fasting for at least 8 hours.   BUN 38 (H) 8 - 23 mg/dL   Creatinine, Ser 2.20 (H) 0.44 - 1.00 mg/dL   Calcium 9.2 8.9 - 10.3 mg/dL   GFR, Estimated 21 (L) >60 mL/min    Comment: (NOTE) Calculated using the CKD-EPI Creatinine Equation (2021)    Anion gap 3 (L) 5 - 15    Comment: Performed at [facility], [place] [address] [place], [place] [address]  CBC with Differential     Status: Abnormal   Collection Time: 11/27/21 11:11 AM  Result Value Ref Range   WBC 8.2 4.0 - 10.5 K/uL   RBC 2.79 (L) 3.87 - 5.11  MIL/uL   Hemoglobin 8.2 (L) 12.0 - 15.0 g/dL   HCT 26.0 (L) 36.0 - 46.0 %   MCV 93.2 80.0 - 100.0 fL   MCH 29.4 26.0 - 34.0 pg   MCHC 31.5 30.0 - 36.0 g/dL   RDW 14.2 11.5 - 15.5 %   Platelets 187 150 - 400 K/uL   nRBC 0.0 0.0 - 0.2 %   Neutrophils Relative % 81 %   Neutro Abs 6.7 1.7 - 7.7 K/uL   Lymphocytes Relative 9 %   Lymphs Abs 0.7 0.7 - 4.0 K/uL   Monocytes Relative 9 %   Monocytes Absolute 0.7 0.1 - 1.0 K/uL   Eosinophils Relative 0 %   Eosinophils Absolute 0.0 0.0 - 0.5 K/uL   Basophils Relative 0 %   Basophils Absolute 0.0 0.0 - 0.1 K/uL   Immature Granulocytes 1 %   Abs Immature Granulocytes 0.06 0.00 - 0.07 K/uL    Comment: Performed at [facility], [place] [address] [place], [place] [address]  Protime-INR     Status: None   Collection Time: 11/27/21 11:11 AM  Result Value Ref Range   Prothrombin Time 14.6 11.4 -  15.2 seconds   INR 1.2 0.8 - 1.2    Comment: (NOTE) INR goal varies based on device and disease states. Performed at [facility], [place] [address] [place], [place] [address]     DG Knee Complete 4 Views Right  Result Date: 11/27/2021 CLINICAL DATA:  [place] in the bathroom today.  Right knee pain. EXAM: RIGHT KNEE - COMPLETE 4+ VIEW COMPARISON:  None Available. FINDINGS: No fracture or bone lesion. Skeletal structures are diffusely demineralized. Marked narrowing of the medial joint space compartment, milder narrowing of the lateral and patellofemoral joint space compartments. Medial compartment subchondral sclerosis. Marginal osteophytes from all 3 compartments. No joint effusion. Surrounding soft tissues are unremarkable. IMPRESSION: 1. No fracture or acute finding. 2. Advanced osteoarthritis predominantly involving the medial compartment. Electronically Signed   By: Lajean Manes M.D.   On: 11/27/2021 12:08   DG Hip Unilat With Pelvis 2-3 Views Right  Result Date: 11/27/2021 CLINICAL DATA:  Right hip pain after fall in bathroom today. EXAM: DG HIP (WITH OR WITHOUT PELVIS) 2-3V RIGHT COMPARISON:  None Available. FINDINGS: There is an acute and comminuted intertrochanteric fracture involving the proximal right femur. There is been lateral and superior displacement of the distal fracture fragments. No sign of dislocation. Previous ORIF of the left femur. IMPRESSION: Acute and comminuted intertrochanteric fracture of the proximal right femur. Electronically Signed   By: Kerby Moors M.D.   On: 11/27/2021 12:07    Review of Systems  Musculoskeletal:  Positive for joint swelling and myalgias.  Blood pressure (!) 151/128, pulse 84, temperature 97.9 F (36.6 C), temperature source Oral, resp. rate (!) 21, SpO2 100 %. Physical Exam Constitutional:      Appearance: Normal appearance.  HENT:     Head: Normocephalic and atraumatic.  Eyes:     Pupils: Pupils are equal, round, and  reactive to light.  Cardiovascular:     Rate and Rhythm: Normal rate.  Pulmonary:     Effort: Pulmonary effort is normal.  Abdominal:     General: Abdomen is flat.     Palpations: Abdomen is soft.  Musculoskeletal:        General: Tenderness and deformity present.     Comments: Right leg shortened and erternally rotated. Compartments soft. Pain with attempted motion. Neurovascularly intact. No  DVT.  Skin:    General: Skin is warm and dry.     Capillary Refill: Capillary refill takes less than 2 seconds.  Neurological:     General: No focal deficit present.     Mental Status: She is alert and oriented to person, place, and time.  Psychiatric:        Mood and Affect: Mood normal.    Assessment/Plan:  Right intertrochanteric hip fracture.  .Anemia  Plan:  !. IM nailing right femur in am 0730 Dr. Lyla Glassing. Discussed with patient and daughter 2. Type and cross check CBC in am. 3. NPO after midnight   Johnn Hai 11/27/2021, 2:26 PM

## 2021-11-27 NOTE — ED Provider Notes (Signed)
[facility]-EMERGENCY DEPT Provider Note   CSN: [id number] Arrival date & time: 11/27/21  1024     History  Chief Complaint  Patient presents with   Marletta Lor    NEVAYA NAGELE is a 88 y.o. female.  Patient presents to the emergency department via EMS secondary to a fall.  Patient states this morning she was trying to go to the bathroom, her foot slipped and she fell directly on the bathroom floor.  She immediately had pain on the right side near her hip and was unable to bear weight.  Denies hitting her head, denies loss of consciousness.  Upon arrival the patient has obvious shortening and rotation of the right lower extremity.  No other injuries or complaints at this time.  Patient has medical history significant for previous femur fracture with ORIF on the left side completed by Dr. Charlann Boxer, spinal arthritis, osteopenia, GERD, hypertension  HPI     Home Medications Prior to Admission medications   Medication Sig Start Date End Date Taking? Authorizing Provider  acetaminophen (TYLENOL) 500 MG tablet Take 1,000 mg by mouth daily.   Yes [provider]  atorvastatin (LIPITOR) 20 MG tablet Take 20 mg by mouth daily. 09/13/21  Yes [provider]  carboxymethylcellulose (REFRESH PLUS) 0.5 % SOLN Place 1 drop into both eyes in the morning, at noon, and at bedtime.   Yes [provider]  dexamethasone (DECADRON) 4 MG tablet Take 4 mg by mouth daily as needed (cough). 09/01/21  Yes [provider]  diphenhydramine-acetaminophen (TYLENOL PM) 25-500 MG TABS tablet Take 2 tablets by mouth at bedtime.   Yes [provider]  escitalopram (LEXAPRO) 10 MG tablet Take 10 mg by mouth daily.   Yes [provider]  famotidine (PEPCID) 20 MG tablet Take 20 mg by mouth 2 (two) times daily.   Yes [provider]  Fexofenadine HCl (ALLEGRA ALLERGY PO) Take 1 tablet by mouth daily.   Yes [provider]   losartan-hydrochlorothiazide (HYZAAR) 100-25 MG tablet Take 1 tablet by mouth daily.   Yes [provider]  Melatonin 10 MG TABS Take 10-20 mg by mouth at bedtime.   Yes [provider]  mirtazapine (REMERON) 15 MG tablet Take 15 mg by mouth at bedtime.   Yes [provider]  Multiple Vitamin (MULTIVITAMIN WITH MINERALS) TABS tablet Take 1 tablet by mouth daily.   Yes [provider]  cetirizine (ZYRTEC) 10 MG tablet Take 10 mg by mouth daily. Patient not taking: Reported on 11/27/2021    [provider]      Allergies    Ambien [zolpidem], Aspirin, Codeine, Namenda [memantine], Percocet [oxycodone-acetaminophen], and Sulfa antibiotics    Review of Systems   Review of Systems  Respiratory:  Negative for shortness of breath.   Cardiovascular:  Negative for chest pain.  Gastrointestinal:  Negative for abdominal pain.  Musculoskeletal:  Positive for arthralgias. Negative for back pain and neck pain.  Neurological:  Negative for syncope and light-headedness.   Physical Exam Updated Vital Signs BP (!) 151/128 (BP Location: Left Arm)   Pulse 84   Temp 97.9 F (36.6 C) (Oral)   Resp (!) 21   SpO2 100%  Physical Exam Vitals and nursing note reviewed.  Constitutional:      General: She is not in acute distress.    Appearance: She is normal weight.  HENT:     Head: Normocephalic and atraumatic.  Eyes:     Conjunctiva/sclera: Conjunctivae  normal.  Cardiovascular:     Rate and Rhythm: Normal rate and regular rhythm.     Pulses: Normal pulses.     Heart sounds: Normal heart sounds.  Pulmonary:     Effort: Pulmonary effort is normal.     Breath sounds: Normal breath sounds.  Musculoskeletal:        General: Tenderness and deformity present.     Cervical back: Normal range of motion and neck supple.     Comments: Right lower extremity shortened and externally rotated.  Strong pedal pulse.  Plantarflexion and dorsiflexion intact.  Sensation  intact.  Patient unwilling to allow range of motion of right hip.  Skin:    General: Skin is warm and dry.  Neurological:     Mental Status: She is alert.    ED Results / Procedures / Treatments   Labs (all labs ordered are listed, but only abnormal results are displayed) Labs Reviewed  BASIC METABOLIC PANEL - Abnormal; Notable for the following components:      Result Value   Chloride 115 (*)    Glucose, Bld 113 (*)    BUN 38 (*)    Creatinine, Ser 2.20 (*)    GFR, Estimated 21 (*)    Anion gap 3 (*)    All other components within normal limits  CBC WITH DIFFERENTIAL/PLATELET - Abnormal; Notable for the following components:   RBC 2.79 (*)    Hemoglobin 8.2 (*)    HCT 26.0 (*)    All other components within normal limits  PROTIME-INR    EKG None  Radiology DG Knee Complete 4 Views Right  Result Date: 11/27/2021 CLINICAL DATA:  Larey SeatFell in the bathroom today.  Right knee pain. EXAM: RIGHT KNEE - COMPLETE 4+ VIEW COMPARISON:  None Available. FINDINGS: No fracture or bone lesion. Skeletal structures are diffusely demineralized. Marked narrowing of the medial joint space compartment, milder narrowing of the lateral and patellofemoral joint space compartments. Medial compartment subchondral sclerosis. Marginal osteophytes from all 3 compartments. No joint effusion. Surrounding soft tissues are unremarkable. IMPRESSION: 1. No fracture or acute finding. 2. Advanced osteoarthritis predominantly involving the medial compartment. Electronically Signed   By: Amie Portlandavid  O[place]ond M.D.   On: 11/27/2021 12:08   DG Hip Unilat With Pelvis 2-3 Views Right  Result Date: 11/27/2021 CLINICAL DATA:  Right hip pain after fall in bathroom today. EXAM: DG HIP (WITH OR WITHOUT PELVIS) 2-3V RIGHT COMPARISON:  None Available. FINDINGS: There is an acute and comminuted intertrochanteric fracture involving the proximal right femur. There is been lateral and superior displacement of the distal fracture fragments. No  sign of dislocation. Previous ORIF of the left femur. IMPRESSION: Acute and comminuted intertrochanteric fracture of the proximal right femur. Electronically Signed   By: Signa Kellaylor  Stroud M.D.   On: 11/27/2021 12:07    Procedures Procedures   Medications Ordered in ED Medications  HYDROmorphone (DILAUDID) injection 0.5 mg (has no administration in time range)  acetaminophen (TYLENOL) tablet 650 mg (has no administration in time range)  pantoprazole (PROTONIX) EC tablet 40 mg (0 mg Oral Hold 11/27/21 1414)  lactated ringers infusion (has no administration in time range)  hydrALAZINE (APRESOLINE) injection 10 mg (has no administration in time range)  atorvastatin (LIPITOR) tablet 20 mg (20 mg Oral Not Given 11/27/21 1417)  escitalopram (LEXAPRO) tablet 10 mg (has no administration in time range)  mirtazapine (REMERON) tablet 15 mg (has no administration in time range)  melatonin tablet 10 mg (has no administration in time  range)  multivitamin with minerals tablet 1 tablet (has no administration in time range)  polyvinyl alcohol (LIQUIFILM TEARS) 1.4 % ophthalmic solution 1 drop (has no administration in time range)  fentaNYL (SUBLIMAZE) injection 12.5 mcg (12.5 mcg Intravenous Given 11/27/21 1250)    ED Course/ Medical Decision Making/ A&P                           Medical Decision Making Amount and/or Complexity of Data Reviewed Labs: ordered. Radiology: ordered.  Risk Prescription drug management. Decision regarding hospitalization.   Patient presents with a chief concern of right-sided hip pain.  Differential includes but is not limited to fracture, dislocation, soft tissue injury, and others  I ordered and interpreted imaging including plain films of the right hip and right knee.  Acute and comminuted intertrochanteric fracture of the proximal right femur.  No fracture or acute finding with the knee.  I agree with the radiologist findings.  I ordered lab testing.  Pertinent results  include hemoglobin 8.2, creatinine 2.20.  I ordered fentanyl for the patient for pain.  Upon reassessment the patient's pain had improved.  Requested consultation with orthopedic surgery.  Dr. Shelle Iron agrees to consult on patient and to line up potential surgeon.  Request medicine admission  I discussed the case with Dr. Ronaldo Miyamoto who agreed to admit the patient to the medicine service  The patient has a broken right hip which will require surgical intervention.  Admit to hospital  Final Clinical Impression(s) / ED Diagnoses Final diagnoses:  Closed displaced intertrochanteric fracture of right femur, initial encounter (HCC)  Anemia, unspecified type  Fall, initial encounter  AKI (acute kidney injury) [facility])    Rx / DC Orders ED Discharge Orders     None         Pamala Duffel 11/27/21 1425    Derwood Kaplan, MD 12/01/21 1122

## 2021-11-27 NOTE — ED Triage Notes (Signed)
Pt bib ems from home with reports of mechanical fall in the bathroom (was not using her walker). Pt denies LOC, hitting head. Pt unable to bear weight R side. +shortening and rotation to RLE. CNS intact.

## 2021-11-28 ENCOUNTER — Encounter (HOSPITAL_COMMUNITY): Payer: Self-pay | Admitting: Internal Medicine

## 2021-11-28 ENCOUNTER — Inpatient Hospital Stay (HOSPITAL_COMMUNITY): Payer: Medicare Other | Admitting: Anesthesiology

## 2021-11-28 ENCOUNTER — Encounter (HOSPITAL_COMMUNITY)
Admission: EM | Disposition: A | Discharge status: Skilled Nursing Facility | Payer: Self-pay | Source: Home / Self Care | Attending: Internal Medicine

## 2021-11-28 ENCOUNTER — Inpatient Hospital Stay (HOSPITAL_COMMUNITY): Payer: Medicare Other

## 2021-11-28 ENCOUNTER — Other Ambulatory Visit: Payer: Self-pay

## 2021-11-28 DIAGNOSIS — F339 Major depressive disorder, recurrent, unspecified: Secondary | ICD-10-CM

## 2021-11-28 DIAGNOSIS — D649 Anemia, unspecified: Secondary | ICD-10-CM | POA: Diagnosis not present

## 2021-11-28 DIAGNOSIS — I10 Essential (primary) hypertension: Secondary | ICD-10-CM

## 2021-11-28 DIAGNOSIS — S72001A Fracture of unspecified part of neck of right femur, initial encounter for closed fracture: Secondary | ICD-10-CM | POA: Diagnosis not present

## 2021-11-28 DIAGNOSIS — F0390 Unspecified dementia without behavioral disturbance: Secondary | ICD-10-CM

## 2021-11-28 DIAGNOSIS — N170 Acute kidney failure with tubular necrosis: Secondary | ICD-10-CM | POA: Diagnosis not present

## 2021-11-28 DIAGNOSIS — N1832 Chronic kidney disease, stage 3b: Secondary | ICD-10-CM

## 2021-11-28 DIAGNOSIS — Z87891 Personal history of nicotine dependence: Secondary | ICD-10-CM

## 2021-11-28 DIAGNOSIS — S72141A Displaced intertrochanteric fracture of right femur, initial encounter for closed fracture: Secondary | ICD-10-CM

## 2021-11-28 HISTORY — PX: INTRAMEDULLARY (IM) NAIL INTERTROCHANTERIC: SHX5875

## 2021-11-28 LAB — BASIC METABOLIC PANEL
Anion gap: 4 — ABNORMAL LOW (ref 5–15)
BUN: 30 mg/dL — ABNORMAL HIGH (ref 8–23)
CO2: 25 mmol/L (ref 22–32)
Calcium: 8.3 mg/dL — ABNORMAL LOW (ref 8.9–10.3)
Chloride: 111 mmol/L (ref 98–111)
Creatinine, Ser: 1.95 mg/dL — ABNORMAL HIGH (ref 0.44–1.00)
GFR, Estimated: 24 mL/min — ABNORMAL LOW (ref >60)
Glucose, Bld: 133 mg/dL — ABNORMAL HIGH (ref 70–99)
Potassium: 4.9 mmol/L (ref 3.5–5.1)
Sodium: 140 mmol/L (ref 135–145)

## 2021-11-28 LAB — URINALYSIS, ROUTINE W REFLEX MICROSCOPIC
Bilirubin Urine: NEGATIVE
Glucose, UA: NEGATIVE mg/dL
Hgb urine dipstick: NEGATIVE
Ketones, ur: NEGATIVE mg/dL
Leukocytes,Ua: NEGATIVE
Nitrite: NEGATIVE
Protein, ur: NEGATIVE mg/dL
Specific Gravity, Urine: 1.016 (ref 1.005–1.030)
pH: 5 (ref 5.0–8.0)

## 2021-11-28 LAB — CBC
HCT: 24.2 % — ABNORMAL LOW (ref 36.0–46.0)
Hemoglobin: 7.7 g/dL — ABNORMAL LOW (ref 12.0–15.0)
MCH: 29.7 pg (ref 26.0–34.0)
MCHC: 31.8 g/dL (ref 30.0–36.0)
MCV: 93.4 fL (ref 80.0–100.0)
Platelets: 130 10*3/uL — ABNORMAL LOW (ref 150–400)
RBC: 2.59 MIL/uL — ABNORMAL LOW (ref 3.87–5.11)
RDW: 14.2 % (ref 11.5–15.5)
WBC: 7.9 10*3/uL (ref 4.0–10.5)
nRBC: 0 % (ref 0.0–0.2)

## 2021-11-28 LAB — MRSA NEXT GEN BY PCR, NASAL: MRSA by PCR Next Gen: NOT DETECTED

## 2021-11-28 SURGERY — FIXATION, FRACTURE, INTERTROCHANTERIC, WITH INTRAMEDULLARY ROD
Anesthesia: General | Laterality: Right

## 2021-11-28 MED ORDER — OXYCODONE HCL 5 MG PO TABS: 5.0000 mg | ORAL_TABLET | ORAL | Status: DC | PRN

## 2021-11-28 MED ORDER — CHLORHEXIDINE GLUCONATE CLOTH 2 % EX PADS
6.0000 | MEDICATED_PAD | Freq: Every day | CUTANEOUS | Status: DC
  Administered 2021-11-28 – 2021-12-04 (×7): 6 via TOPICAL

## 2021-11-28 MED ORDER — FENTANYL CITRATE PF 50 MCG/ML IJ SOSY: 25.0000 ug | PREFILLED_SYRINGE | INTRAMUSCULAR | Status: DC | PRN

## 2021-11-28 MED ORDER — METHOCARBAMOL 500 MG IVPB - SIMPLE MED
500.0000 mg | Freq: Four times a day (QID) | INTRAVENOUS | Status: DC | PRN
  Administered 2021-11-30: 500 mg via INTRAVENOUS
  Filled 2021-11-28: qty 500

## 2021-11-28 MED ORDER — SUGAMMADEX SODIUM 200 MG/2ML IV SOLN
INTRAVENOUS | Status: DC | PRN
  Administered 2021-11-28: 200 mg via INTRAVENOUS

## 2021-11-28 MED ORDER — LACTATED RINGERS IV SOLN: INTRAVENOUS | Status: DC

## 2021-11-28 MED ORDER — DM-GUAIFENESIN ER 30-600 MG PO TB12
1.0000 | ORAL_TABLET | Freq: Two times a day (BID) | ORAL | Status: DC
  Administered 2021-11-28 – 2021-12-05 (×14): 1 via ORAL
  Filled 2021-11-28 (×14): qty 1

## 2021-11-28 MED ORDER — ONDANSETRON HCL 4 MG/2ML IJ SOLN: 4.0000 mg | Freq: Four times a day (QID) | INTRAMUSCULAR | Status: DC | PRN

## 2021-11-28 MED ORDER — METHOCARBAMOL 500 MG PO TABS: 500.0000 mg | ORAL_TABLET | Freq: Four times a day (QID) | ORAL | Status: DC | PRN

## 2021-11-28 MED ORDER — TRANEXAMIC ACID-NACL 1000-0.7 MG/100ML-% IV SOLN
1000.0000 mg | INTRAVENOUS | Status: AC
  Administered 2021-11-28: 1000 mg via INTRAVENOUS

## 2021-11-28 MED ORDER — MENTHOL 3 MG MT LOZG
1.0000 | LOZENGE | OROMUCOSAL | Status: DC | PRN
Start: 2021-11-28 — End: 2021-12-05

## 2021-11-28 MED ORDER — APIXABAN 2.5 MG PO TABS
2.5000 mg | ORAL_TABLET | Freq: Two times a day (BID) | ORAL | Status: DC
  Administered 2021-11-29 – 2021-12-03 (×6): 2.5 mg via ORAL
  Filled 2021-11-28 (×8): qty 1

## 2021-11-28 MED ORDER — DOCUSATE SODIUM 100 MG PO CAPS
100.0000 mg | ORAL_CAPSULE | Freq: Two times a day (BID) | ORAL | Status: DC
  Administered 2021-11-28 – 2021-12-05 (×13): 100 mg via ORAL
  Filled 2021-11-28 (×14): qty 1

## 2021-11-28 MED ORDER — ROCURONIUM BROMIDE 10 MG/ML (PF) SYRINGE
PREFILLED_SYRINGE | INTRAVENOUS | Status: DC | PRN
  Administered 2021-11-28: 70 mg via INTRAVENOUS

## 2021-11-28 MED ORDER — LIDOCAINE HCL (PF) 2 % IJ SOLN
INTRAMUSCULAR | Status: AC
  Filled 2021-11-28: qty 5

## 2021-11-28 MED ORDER — TRANEXAMIC ACID-NACL 1000-0.7 MG/100ML-% IV SOLN
INTRAVENOUS | Status: AC
  Filled 2021-11-28: qty 100

## 2021-11-28 MED ORDER — METOCLOPRAMIDE HCL 5 MG/ML IJ SOLN: 5.0000 mg | Freq: Three times a day (TID) | INTRAMUSCULAR | Status: DC | PRN

## 2021-11-28 MED ORDER — HYDROMORPHONE HCL 2 MG PO TABS: 2.0000 mg | ORAL_TABLET | ORAL | Status: DC | PRN

## 2021-11-28 MED ORDER — ROCURONIUM BROMIDE 10 MG/ML (PF) SYRINGE
PREFILLED_SYRINGE | INTRAVENOUS | Status: AC
  Filled 2021-11-28: qty 10

## 2021-11-28 MED ORDER — SODIUM CHLORIDE 0.9 % IV SOLN: INTRAVENOUS | Status: DC

## 2021-11-28 MED ORDER — ONDANSETRON HCL 4 MG/2ML IJ SOLN
4.0000 mg | Freq: Once | INTRAMUSCULAR | Status: DC | PRN
Start: 2021-11-28 — End: 2021-11-28

## 2021-11-28 MED ORDER — PHENYLEPHRINE 80 MCG/ML (10ML) SYRINGE FOR IV PUSH (FOR BLOOD PRESSURE SUPPORT)
PREFILLED_SYRINGE | INTRAVENOUS | Status: DC | PRN
  Administered 2021-11-28 (×2): 80 ug via INTRAVENOUS

## 2021-11-28 MED ORDER — LIDOCAINE HCL (PF) 1 % IJ SOLN
INTRAMUSCULAR | Status: AC
  Filled 2021-11-28: qty 30

## 2021-11-28 MED ORDER — LIDOCAINE HCL 1 % IJ SOLN
INTRAMUSCULAR | Status: DC | PRN
  Administered 2021-11-28: 20 mL

## 2021-11-28 MED ORDER — QUETIAPINE FUMARATE 25 MG PO TABS
25.0000 mg | ORAL_TABLET | Freq: Every day | ORAL | Status: DC
  Administered 2021-11-28 – 2021-12-04 (×7): 25 mg via ORAL
  Filled 2021-11-28 (×7): qty 1

## 2021-11-28 MED ORDER — TRANEXAMIC ACID-NACL 1000-0.7 MG/100ML-% IV SOLN
1000.0000 mg | Freq: Once | INTRAVENOUS | Status: AC
  Administered 2021-11-28: 1000 mg via INTRAVENOUS
  Filled 2021-11-28 (×2): qty 100

## 2021-11-28 MED ORDER — ENSURE SURGERY PO LIQD
237.0000 mL | Freq: Two times a day (BID) | ORAL | Status: DC
  Administered 2021-11-28 – 2021-12-04 (×7): 237 mL via ORAL
  Filled 2021-11-28 (×16): qty 237

## 2021-11-28 MED ORDER — CEFAZOLIN SODIUM-DEXTROSE 2-4 GM/100ML-% IV SOLN
INTRAVENOUS | Status: AC
  Filled 2021-11-28: qty 100

## 2021-11-28 MED ORDER — HYDROMORPHONE HCL 2 MG PO TABS: 1.0000 mg | ORAL_TABLET | ORAL | Status: DC | PRN

## 2021-11-28 MED ORDER — ACETAMINOPHEN 500 MG PO TABS
1000.0000 mg | ORAL_TABLET | Freq: Four times a day (QID) | ORAL | Status: DC
  Administered 2021-11-28 – 2021-12-05 (×21): 1000 mg via ORAL
  Filled 2021-11-28 (×24): qty 2

## 2021-11-28 MED ORDER — FENTANYL CITRATE (PF) 100 MCG/2ML IJ SOLN
INTRAMUSCULAR | Status: DC | PRN
  Administered 2021-11-28: 100 ug via INTRAVENOUS

## 2021-11-28 MED ORDER — POLYETHYLENE GLYCOL 3350 17 G PO PACK: 17.0000 g | PACK | Freq: Every day | ORAL | Status: DC | PRN

## 2021-11-28 MED ORDER — BUPIVACAINE HCL (PF) 0.5 % IJ SOLN
INTRAMUSCULAR | Status: AC
  Filled 2021-11-28: qty 30

## 2021-11-28 MED ORDER — SODIUM CHLORIDE (PF) 0.9 % IJ SOLN
INTRAMUSCULAR | Status: AC
  Filled 2021-11-28: qty 20

## 2021-11-28 MED ORDER — PROPOFOL 1000 MG/100ML IV EMUL
INTRAVENOUS | Status: AC
  Filled 2021-11-28: qty 100

## 2021-11-28 MED ORDER — DEXAMETHASONE SODIUM PHOSPHATE 10 MG/ML IJ SOLN
INTRAMUSCULAR | Status: AC
  Filled 2021-11-28: qty 1

## 2021-11-28 MED ORDER — LACTATED RINGERS IV SOLN: INTRAVENOUS | Status: DC | PRN

## 2021-11-28 MED ORDER — METOCLOPRAMIDE HCL 5 MG PO TABS: 5.0000 mg | ORAL_TABLET | Freq: Three times a day (TID) | ORAL | Status: DC | PRN

## 2021-11-28 MED ORDER — CEFAZOLIN SODIUM-DEXTROSE 2-4 GM/100ML-% IV SOLN
2.0000 g | Freq: Four times a day (QID) | INTRAVENOUS | Status: AC
  Administered 2021-11-28 (×2): 2 g via INTRAVENOUS
  Filled 2021-11-28 (×3): qty 100

## 2021-11-28 MED ORDER — ACETAMINOPHEN 10 MG/ML IV SOLN
INTRAVENOUS | Status: AC
  Filled 2021-11-28: qty 100

## 2021-11-28 MED ORDER — KETAMINE HCL 10 MG/ML IJ SOLN
INTRAMUSCULAR | Status: DC | PRN
  Administered 2021-11-28: 30 mg via INTRAVENOUS

## 2021-11-28 MED ORDER — CEFAZOLIN SODIUM-DEXTROSE 2-4 GM/100ML-% IV SOLN
2.0000 g | INTRAVENOUS | Status: AC
  Administered 2021-11-28: 2 g via INTRAVENOUS

## 2021-11-28 MED ORDER — PHENYLEPHRINE HCL-NACL 20-0.9 MG/250ML-% IV SOLN
INTRAVENOUS | Status: AC
  Filled 2021-11-28: qty 250

## 2021-11-28 MED ORDER — PROPOFOL 10 MG/ML IV BOLUS
INTRAVENOUS | Status: DC | PRN
  Administered 2021-11-28: 120 mg via INTRAVENOUS

## 2021-11-28 MED ORDER — ISOPROPYL ALCOHOL 70 % SOLN
Status: DC | PRN
  Administered 2021-11-28: 1 via TOPICAL

## 2021-11-28 MED ORDER — ONDANSETRON HCL 4 MG/2ML IJ SOLN
INTRAMUSCULAR | Status: DC | PRN
  Administered 2021-11-28: 4 mg via INTRAVENOUS

## 2021-11-28 MED ORDER — SODIUM CHLORIDE 0.9 % IR SOLN
Status: DC | PRN
  Administered 2021-11-28: 1000 mL

## 2021-11-28 MED ORDER — PROPOFOL 10 MG/ML IV BOLUS
INTRAVENOUS | Status: AC
  Filled 2021-11-28: qty 20

## 2021-11-28 MED ORDER — FENTANYL CITRATE (PF) 100 MCG/2ML IJ SOLN
INTRAMUSCULAR | Status: AC
  Filled 2021-11-28: qty 2

## 2021-11-28 MED ORDER — LIDOCAINE 2% (20 MG/ML) 5 ML SYRINGE
INTRAMUSCULAR | Status: DC | PRN
  Administered 2021-11-28: 60 mg via INTRAVENOUS

## 2021-11-28 MED ORDER — POVIDONE-IODINE 10 % EX SWAB: 2.0000 "application " | Freq: Once | CUTANEOUS | Status: DC

## 2021-11-28 MED ORDER — BUPIVACAINE HCL (PF) 0.5 % IJ SOLN
INTRAMUSCULAR | Status: DC | PRN
  Administered 2021-11-28: 20 mL

## 2021-11-28 MED ORDER — SENNA 8.6 MG PO TABS
1.0000 | ORAL_TABLET | Freq: Two times a day (BID) | ORAL | Status: DC
  Administered 2021-11-28 – 2021-12-05 (×13): 8.6 mg via ORAL
  Filled 2021-11-28 (×14): qty 1

## 2021-11-28 MED ORDER — HYDROMORPHONE HCL 1 MG/ML IJ SOLN: 0.5000 mg | INTRAMUSCULAR | Status: DC | PRN

## 2021-11-28 MED ORDER — ACETAMINOPHEN 325 MG PO TABS
325.0000 mg | ORAL_TABLET | Freq: Four times a day (QID) | ORAL | Status: DC | PRN
  Administered 2021-11-29 – 2021-12-03 (×2): 650 mg via ORAL
  Filled 2021-11-28 (×3): qty 2

## 2021-11-28 MED ORDER — ONDANSETRON HCL 4 MG/2ML IJ SOLN
INTRAMUSCULAR | Status: AC
  Filled 2021-11-28: qty 2

## 2021-11-28 MED ORDER — ONDANSETRON HCL 4 MG PO TABS
4.0000 mg | ORAL_TABLET | Freq: Four times a day (QID) | ORAL | Status: DC | PRN
  Administered 2021-11-29: 4 mg via ORAL
  Filled 2021-11-28: qty 1

## 2021-11-28 MED ORDER — DEXAMETHASONE SODIUM PHOSPHATE 10 MG/ML IJ SOLN
INTRAMUSCULAR | Status: DC | PRN
  Administered 2021-11-28: 6 mg via INTRAVENOUS

## 2021-11-28 MED ORDER — ACETAMINOPHEN 10 MG/ML IV SOLN
INTRAVENOUS | Status: DC | PRN
  Administered 2021-11-28: 1000 mg via INTRAVENOUS

## 2021-11-28 MED ORDER — PHENOL 1.4 % MT LIQD: 1.0000 | OROMUCOSAL | Status: DC | PRN

## 2021-11-28 SURGICAL SUPPLY — 45 items
BAG COUNTER SPONGE SURGICOUNT (BAG) IMPLANT
BAG ZIPLOCK 12X15 (MISCELLANEOUS) IMPLANT
BIT DRILL CANN LG 4.3MM (BIT) IMPLANT
CHLORAPREP W/TINT 26 (MISCELLANEOUS) ×2 IMPLANT
COVER PERINEAL POST (MISCELLANEOUS) ×2 IMPLANT
COVER SURGICAL LIGHT HANDLE (MISCELLANEOUS) ×2 IMPLANT
DERMABOND ADVANCED (GAUZE/BANDAGES/DRESSINGS) ×1
DERMABOND ADVANCED .7 DNX12 (GAUZE/BANDAGES/DRESSINGS) ×2 IMPLANT
DRAPE C-ARM 42X120 X-RAY (DRAPES) ×2 IMPLANT
DRAPE C-ARMOR (DRAPES) ×2 IMPLANT
DRAPE IMP U-DRAPE 54X76 (DRAPES) ×4 IMPLANT
DRAPE SHEET LG 3/4 BI-LAMINATE (DRAPES) ×4 IMPLANT
DRAPE STERI IOBAN 125X83 (DRAPES) ×2 IMPLANT
DRAPE U-SHAPE 47X51 STRL (DRAPES) ×4 IMPLANT
DRESSING MEPILEX FLEX 4X4 (GAUZE/BANDAGES/DRESSINGS) ×2 IMPLANT
DRILL BIT CANN LG 4.3MM (BIT) ×2
DRSG MEPILEX BORDER 4X8 (GAUZE/BANDAGES/DRESSINGS) ×1 IMPLANT
DRSG MEPILEX FLEX 4X4 (GAUZE/BANDAGES/DRESSINGS) ×4
ELECT BLADE TIP CTD 4 INCH (ELECTRODE) ×1 IMPLANT
FACESHIELD WRAPAROUND (MASK) ×2 IMPLANT
FACESHIELD WRAPAROUND OR TEAM (MASK) ×2 IMPLANT
GAUZE SPONGE 4X4 12PLY STRL (GAUZE/BANDAGES/DRESSINGS) ×2 IMPLANT
GLOVE BIO SURGEON STRL SZ8.5 (GLOVE) ×4 IMPLANT
GLOVE BIOGEL M 7.0 STRL (GLOVE) ×2 IMPLANT
GLOVE BIOGEL PI IND STRL 7.5 (GLOVE) ×1 IMPLANT
GLOVE BIOGEL PI IND STRL 8 (GLOVE) ×1 IMPLANT
GLOVE BIOGEL PI IND STRL 8.5 (GLOVE) ×1 IMPLANT
GLOVE BIOGEL PI INDICATOR 7.5 (GLOVE) ×1
GLOVE BIOGEL PI INDICATOR 8 (GLOVE) ×1
GLOVE BIOGEL PI INDICATOR 8.5 (GLOVE) ×1
GLOVE SURG LX 7.5 STRW (GLOVE) ×2
GLOVE SURG LX STRL 7.5 STRW (GLOVE) ×2 IMPLANT
GOWN SPEC L3 XXLG W/TWL (GOWN DISPOSABLE) ×3 IMPLANT
GUIDEPIN VERSANAIL DSP 3.2X444 (ORTHOPEDIC DISPOSABLE SUPPLIES) ×2 IMPLANT
HFN 125 DEG 11MM X 180MM (Orthopedic Implant) ×1 IMPLANT
HIP FRA NAIL LAG SCREW 10.5X90 (Orthopedic Implant) ×2 IMPLANT
KIT BASIN OR (CUSTOM PROCEDURE TRAY) ×2 IMPLANT
MANIFOLD NEPTUNE II (INSTRUMENTS) ×2 IMPLANT
MARKER SKIN DUAL TIP RULER LAB (MISCELLANEOUS) ×2 IMPLANT
PACK GENERAL/GYN (CUSTOM PROCEDURE TRAY) ×2 IMPLANT
SCREW BONE CORTICAL 5.0X36 (Screw) ×1 IMPLANT
SCREW LAG HIP FRA NAIL 10.5X90 (Orthopedic Implant) IMPLANT
SUT MNCRL AB 3-0 PS2 18 (SUTURE) ×2 IMPLANT
SUT VIC AB 1 CT1 36 (SUTURE) ×2 IMPLANT
TOWEL OR 17X26 10 PK STRL BLUE (TOWEL DISPOSABLE) ×2 IMPLANT

## 2021-11-28 NOTE — Progress Notes (Signed)
Initial Nutrition Assessment  DOCUMENTATION CODES:   Not applicable  INTERVENTION:  - diet advancement as medically feasible. - will order Ensure Surgery BID, each supplement provides 330 kcal and 18 grams of protein. - monitor for appropriateness of completing NFPE.   NUTRITION DIAGNOSIS:   Increased nutrient needs related to hip fracture, post-op healing as evidenced by estimated needs.  GOAL:   Patient will meet greater than or equal to 90% of their needs  MONITOR:   Diet advancement, PO intake, Supplement acceptance, Labs, Weight trends, Skin  REASON FOR ASSESSMENT:   Consult Hip fracture protocol  ASSESSMENT:   88 y.o. female with medical history of HTN, HLD, stage 3 CKD, GERD, scoliosis, osteopenia, hypercholesterolemia, goiter, and depression. She presented to the ED with R hip after a fall. Her daughter called EMS who transported her to the ED where she was found to have R hip fracture.  Patient is out of the room to OR (now in PACU) for IM fixation of R femur fracture. She was on Heart Healthy diet yesterday at 1413 until midnight when she was made NPO; no meal intakes documented.  RN note from yesterday at 2255 outlines confusion and physical aggression toward staff.  Flow sheet documentation indicates patient is a/o to self with poor judgement, poor safety awareness, and memory impairment.   She has not been seen by a [place] RD at any time in the past.  Weight yesterday was 143 lb and weight on 06/07/21 was 151 lb. This indicates 8 lb weight loss (5.3% body weight) in the past 5.5 months.   Labs reviewed; Cl: 115 mmol/l, BUN: 38 mg/dl, creatinine: 2.2 mg/dl, GFR: 21 ml/min.  Medications reviewed; 1 tablet multivitamin with minerals/day.  IVF; LR @ 75 ml/hr.    NUTRITION - FOCUSED PHYSICAL EXAM:  Patient in [place]; noted to be physically aggressive last night  Diet Order:   Diet Order             Diet NPO time specified  Diet effective midnight                    EDUCATION NEEDS:   No education needs have been identified at this time  Skin:  Skin Assessment: Skin Integrity Issues: Skin Integrity Issues:: Incisions Incisions: R hip (5/29)  Last BM:  PTA/unknown  Height:   Ht Readings from Last 1 Encounters:  11/27/21 5\' 5"  (1.651 m)    Weight:   Wt Readings from Last 1 Encounters:  11/27/21 65.1 kg     BMI:  Body mass index is 23.88 kg/m.  Estimated Nutritional Needs:  Kcal:  1650-1850 kcal Protein:  82-92 grams Fluid:  >/= 1.7 L/day     Jarome Matin, MS, RD, LDN Registered Dietitian II Inpatient Clinical Nutrition RD pager # and on-call/weekend pager # available in [facility]

## 2021-11-28 NOTE — Anesthesia Postprocedure Evaluation (Signed)
Anesthesia Post Note  Patient: Nancy Suarez  Procedure(s) Performed: INTRAMEDULLARY (IM) NAIL INTERTROCHANTRIC (Right)     Patient location during evaluation: PACU Anesthesia Type: General Level of consciousness: awake and alert and confused Pain management: pain level controlled Vital Signs Assessment: post-procedure vital signs reviewed and stable Respiratory status: spontaneous breathing, nonlabored ventilation, respiratory function stable and patient connected to nasal cannula oxygen Cardiovascular status: blood pressure returned to baseline and stable Postop Assessment: no apparent nausea or vomiting Anesthetic complications: no   No notable events documented.  Last Vitals:  Vitals:   11/28/21 0945 11/28/21 1000  BP: 132/66 (!) 145/58  Pulse: 73 65  Resp: 14 11  Temp:  36.9 C  SpO2: 100% 100%    Last Pain:  Vitals:   11/28/21 1000  TempSrc:   PainSc: Asleep                 Nataley Bahri A.

## 2021-11-28 NOTE — Progress Notes (Signed)
PROGRESS NOTE    Nancy Suarez  WUJ:[id number] DOB: 06/19/1932 DOA: 11/27/2021 PCP: Corine ShelterKilpatrick, George, MD    Brief Narrative:  88 year old female with a history of hypertension, hyperlipidemia, chronic kidney disease stage IIIb, was at home when she had a slip and fall.  She suffered a right hip fracture.  She was admitted for further operative management.  Also noted to have elevated creatinine which appears to be above baseline.  She receiving IV fluids.   Assessment & Plan:   Principal Problem:   Hip fracture (HCC) Active Problems:   GERD (gastroesophageal reflux disease)   Essential hypertension   HLD (hyperlipidemia)   Depression   Acute renal failure superimposed on stage 3b chronic kidney disease (HCC)   Fall   Normocytic anemia   Right hip fracture Fall -Seen by orthopedics and underwent operative management on 5/29 -Patient to start apixaban for DVT prophylaxis on 5/30 -Continue pain management -Daughter reluctant to use opiate pain medication since patient has not done well on these in the past -We will trial scheduled Tylenol -As needed oxycodone for severe pain -PT/OT  Acute kidney injury on chronic kidney disease stage IIIb -Baseline creatinine appears to be around 1.4 -Admission creatinine noted to be 2.2 -No hydronephrosis on imaging -We will continue on IV fluids -Monitor urine output  Hypertension -Blood pressure currently stable -Holding losartan/hydrochlorothiazide in light of acute kidney injury  Hyperlipidemia -Continue statin  Depression -Continued on Lexapro/mirtazapine  GERD -Continue PPI  Anemia, likely related to chronic kidney disease -No evidence of bleeding -She received 1 unit of PRBC on admission -Baseline home for globin appears to be between 8-9 -Continue to follow hemoglobin and transfuse as needed -I suspect that her current hemoglobin may continue to trend down since she is receiving IV fluids.  Delirium superimposed  on likely mild cognitive impairment -Daughter reports that patient has been sundowning regularly at home for over a year now -She has been following with neurology -Recent MMSE score 21/30 -Noted to be increasingly confused overnight -Possibly related to acute injury from hip fracture and change of environment in the hospital -Check urinalysis, TSH, ammonia -We will use Seroquel at night   DVT prophylaxis: apixaban (ELIQUIS) tablet 2.5 mg Start: 11/29/21 1000 SCDs Start: 11/28/21 1256 apixaban (ELIQUIS) tablet 2.5 mg  Code Status: Full code Family Communication: Discussed with patient's daughter over the phone Disposition Plan: Status is: Inpatient Remains inpatient appropriate because: Continue postoperative care for hip fracture     Consultants:  Orthopedics  Procedures:  5/29 intramedullary fixation of right femur  Antimicrobials:      Subjective: Patient seen in room postoperatively.  She is somnolent.  Does not appear to be in any distress.  Objective: Vitals:   11/28/21 0930 11/28/21 0945 11/28/21 1000 11/28/21 1013  BP: (!) 137/110 132/66 (!) 145/58 (!) 141/48  Pulse: 80 73 65 69  Resp: (!) 22 14 11 16   Temp:   98.4 F (36.9 C) 98.3 F (36.8 C)  TempSrc:    Axillary  SpO2: 100% 100% 100% 99%  Weight:      Height:        Intake/Output Summary (Last 24 hours) at 11/28/2021 1257 Last data filed at 11/28/2021 [id number] Gross per 24 hour  Intake 2707.5 ml  Output 925 ml  Net 1782.5 ml   Filed Weights   11/27/21 1627  Weight: 65.1 kg    Examination:  General exam: somnolent Respiratory system: Clear to auscultation. Respiratory effort normal. Cardiovascular system: S1 &  S2 heard, RRR. No JVD, murmurs, rubs, gallops or clicks. No pedal edema. Gastrointestinal system: Abdomen is nondistended, soft and nontender. No organomegaly or masses felt. Normal bowel sounds heard. Central nervous system: No focal neurological deficits. Extremities: Symmetric 5 x 5  power. Skin: No rashes, lesions or ulcers Psychiatry: somnolent    Data Reviewed: I have personally reviewed following labs and imaging studies  CBC: Recent Labs  Lab 11/27/21 1111 11/28/21 0930  WBC 8.2 7.9  NEUTROABS 6.7  --   HGB 8.2* 7.7*  HCT 26.0* 24.2*  MCV 93.2 93.4  PLT 187 [id number]*   Basic Metabolic Panel: Recent Labs  Lab 11/27/21 1111 11/28/21 0930  NA 143 140  K 4.8 4.9  CL 115* 111  CO2 25 25  GLUCOSE 113* 133*  BUN 38* 30*  CREATININE 2.20* 1.95*  CALCIUM 9.2 8.3*   GFR: Estimated Creatinine Clearance: 17.9 mL/min (A) (by C-G formula based on SCr of 1.95 mg/dL (H)). Liver Function Tests: No results for input(s): AST, ALT, ALKPHOS, BILITOT, PROT, ALBUMIN in the last 168 hours. No results for input(s): LIPASE, AMYLASE in the last 168 hours. No results for input(s): AMMONIA in the last 168 hours. Coagulation Profile: Recent Labs  Lab 11/27/21 1111  INR 1.2   Cardiac Enzymes: No results for input(s): CKTOTAL, CKMB, CKMBINDEX, TROPONINI in the last 168 hours. BNP (last 3 results) No results for input(s): PROBNP in the last 8760 hours. HbA1C: No results for input(s): HGBA1C in the last 72 hours. CBG: No results for input(s): GLUCAP in the last 168 hours. Lipid Profile: No results for input(s): CHOL, HDL, LDLCALC, TRIG, CHOLHDL, LDLDIRECT in the last 72 hours. Thyroid Function Tests: No results for input(s): TSH, T4TOTAL, FREET4, T3FREE, THYROIDAB in the last 72 hours. Anemia Panel: No results for input(s): VITAMINB12, FOLATE, FERRITIN, TIBC, IRON, RETICCTPCT in the last 72 hours. Sepsis Labs: No results for input(s): PROCALCITON, LATICACIDVEN in the last 168 hours.  Recent Results (from the past 240 hour(s))  MRSA Next Gen by PCR, Nasal     Status: None   Collection Time: 11/28/21  4:52 AM   Specimen: Nasal Mucosa; Nasal Swab  Result Value Ref Range Status   MRSA by PCR Next Gen NOT DETECTED NOT DETECTED Final    Comment: (NOTE) The GeneXpert  MRSA Assay (FDA approved for NASAL specimens only), is one component of a comprehensive MRSA colonization surveillance program. It is not intended to diagnose MRSA infection nor to guide or monitor treatment for MRSA infections. Test performance is not FDA approved in patients less than 73 years old. Performed at [facility], [place] [address], [place], [place] [address]          Radiology Studies: US RENAL  Result Date: 11/27/2021 CLINICAL DATA:  Acute kidney injury. EXAM: RENAL / URINARY TRACT ULTRASOUND COMPLETE COMPARISON:  None Available. FINDINGS: Right Kidney: Renal measurements: 5.2 x 5.1 x 5.6 cm = volume: 137 mL. Limited visualization. Grossly normal parenchymal echogenicity. No hydronephrosis. Upper pole cyst measuring 2.8 cm. No other defined masses. Left Kidney: Renal measurements: 7.7 x 4.0 x 4.2 cm = volume: 66.7 mL. Normal parenchymal echogenicity. Cyst arises from the upper pole, 2.9 cm. No other masses. No hydronephrosis. Bladder: Appears normal for degree of bladder distention. Other: None. IMPRESSION: 1. Limited study due to patient is breathing, inability to move and bowel gas. Allowing for this, no acute findings. No hydronephrosis. 2. Bilateral small kidneys, left smaller than the right, consistent with medical renal disease.  3. Single bilateral renal cysts. Electronically Signed   By: Amie Portland M.D.   On: 11/27/2021 16:10   Pelvis Portable  Result Date: 11/28/2021 CLINICAL DATA:  Status post right intramedullary nail. EXAM: PORTABLE PELVIS 1-2 VIEWS COMPARISON:  None Available. FINDINGS: Status post right hip intramedullary nail with dynamic screw and a single distal interlocking screw. Subcutaneous emphysema and surgical clips along the lateral aspect of the thigh. Partially imaged intramedullary nail in the left femur. IMPRESSION: Status post right femoral intramedullary nail with dynamic screw. Electronically Signed   By: Larose Hires D.O.   On:  11/28/2021 09:44   DG CHEST PORT 1 VIEW  Result Date: 11/27/2021 CLINICAL DATA:  Cough for 1 week.  Right hip fracture. EXAM: PORTABLE CHEST 1 VIEW COMPARISON:  04/13/2021. FINDINGS: Cardiac silhouette is normal in size. No mediastinal or hilar masses. Elevated right hemidiaphragm. Mild linear opacity above the right hemidiaphragm consistent with atelectasis. Subtle focal opacity in the right upper lung, which could reflect a nodule. Lungs otherwise clear. No pleural effusion or pneumothorax. Skeletal structures are demineralized, grossly intact. IMPRESSION: 1. No acute cardiopulmonary disease. 2. Possible right upper lobe nodule. Recommend non urgent follow-up chest CT without contrast for further assessment. Electronically Signed   By: Amie Portland M.D.   On: 11/27/2021 14:50   DG Knee Complete 4 Views Right  Result Date: 11/27/2021 CLINICAL DATA:  Larey Seat in the bathroom today.  Right knee pain. EXAM: RIGHT KNEE - COMPLETE 4+ VIEW COMPARISON:  None Available. FINDINGS: No fracture or bone lesion. Skeletal structures are diffusely demineralized. Marked narrowing of the medial joint space compartment, milder narrowing of the lateral and patellofemoral joint space compartments. Medial compartment subchondral sclerosis. Marginal osteophytes from all 3 compartments. No joint effusion. Surrounding soft tissues are unremarkable. IMPRESSION: 1. No fracture or acute finding. 2. Advanced osteoarthritis predominantly involving the medial compartment. Electronically Signed   By: Amie Portland M.D.   On: 11/27/2021 12:08   DG C-Arm 1-60 Min-No Report  Result Date: 11/28/2021 Fluoroscopy was utilized by the requesting physician.  No radiographic interpretation.   DG HIP PORT UNILAT WITH PELVIS 1V RIGHT  Result Date: 11/28/2021 CLINICAL DATA:  Right hip fluoroscopic image for intramedullary nail placement EXAM: DG HIP (WITH OR WITHOUT PELVIS) 1V PORT RIGHT COMPARISON:  Radiographs dated Nov 27, 2021 FINDINGS:  Intraoperative fluoroscopic images for right hip intramedullary nail placement for intertrochanteric fracture. Total fluoroscopic time was 1 minute. Total fluoroscopic dose was 7.99 mGy. IMPRESSION: Intraoperative utilization of fluoroscopy for intramedullary nail placement for ORIF of right intertrochanteric fracture. Electronically Signed   By: Larose Hires D.O.   On: 11/28/2021 09:46   DG Hip Unilat With Pelvis 2-3 Views Right  Result Date: 11/27/2021 CLINICAL DATA:  Right hip pain after fall in bathroom today. EXAM: DG HIP (WITH OR WITHOUT PELVIS) 2-3V RIGHT COMPARISON:  None Available. FINDINGS: There is an acute and comminuted intertrochanteric fracture involving the proximal right femur. There is been lateral and superior displacement of the distal fracture fragments. No sign of dislocation. Previous ORIF of the left femur. IMPRESSION: Acute and comminuted intertrochanteric fracture of the proximal right femur. Electronically Signed   By: Signa Kell M.D.   On: 11/27/2021 12:07        Scheduled Meds:  sodium chloride   Intravenous Once   [START ON 11/29/2021] apixaban  2.5 mg Oral BID   Chlorhexidine Gluconate Cloth  6 each Topical Daily   docusate sodium  100 mg Oral BID  escitalopram  10 mg Oral Daily   feeding supplement  237 mL Oral BID BM   melatonin  10 mg Oral QHS   mirtazapine  15 mg Oral QHS   multivitamin with minerals  1 tablet Oral Daily   pantoprazole  40 mg Oral Daily   senna  1 tablet Oral BID   Continuous Infusions:  sodium chloride      ceFAZolin (ANCEF) IV     lactated ringers     methocarbamol (ROBAXIN) IV     tranexamic acid       LOS: 1 day    Time spent: 55mins    Kathie Dike, MD [facility]   If 7PM-7AM, please contact night-coverage [email or web address]  11/28/2021, 12:57 PM

## 2021-11-28 NOTE — Discharge Instructions (Addendum)
 Dr. Brian Swinteck Adult Hip & Knee Specialist [facility] [address] [place], [place] [address] [phone]   POSTOPERATIVE DIRECTIONS    Hip Rehabilitation, Guidelines Following Surgery   WEIGHT BEARING Weight bearing as tolerated with assist device (walker, cane, etc) as directed, use it as long as suggested by your surgeon or therapist, typically at least 4-6 weeks.   HOME CARE INSTRUCTIONS  Remove items at home which could result in a fall. This includes throw rugs or furniture in walking pathways.  Continue medications as instructed at time of discharge. You may have some home medications which will be placed on hold until you complete the course of blood thinner medication. 4 days after discharge, you may start showering. No tub baths or soaking your incisions. Do not put on socks or shoes without following the instructions of your caregivers.   Sit on chairs with arms. Use the chair arms to help push yourself up when arising.  Arrange for the use of a toilet seat elevator so you are not sitting low.  Walk with walker as instructed.  You may resume a sexual relationship in one month or when given the OK by your caregiver.  Use walker as long as suggested by your caregivers.  Avoid periods of inactivity such as sitting longer than an hour when not asleep. This helps prevent blood clots.  You may return to work once you are cleared by your surgeon.  Do not drive a car for 6 weeks or until released by your surgeon.  Do not drive while taking narcotics.  Wear elastic stockings for two weeks following surgery during the day but you may remove then at night.  Make sure you keep all of your appointments after your operation with all of your doctors and caregivers. You should call the office at the above phone number and make an appointment for approximately two weeks after the date of your surgery. Please pick up a stool softener and laxative for  home use as long as you are requiring pain medications. ICE to the affected hip every three hours for 30 minutes at a time and then as needed for pain and swelling. Continue to use ice on the hip for pain and swelling from surgery. You may notice swelling that will progress down to the foot and ankle.  This is normal after surgery.  Elevate the leg when you are not up walking on it.   It is important for you to complete the blood thinner medication as prescribed by your doctor. Continue to use the breathing machine which will help keep your temperature down.  It is common for your temperature to cycle up and down following surgery, especially at night when you are not up moving around and exerting yourself.  The breathing machine keeps your lungs expanded and your temperature down.  RANGE OF MOTION AND STRENGTHENING EXERCISES  These exercises are designed to help you keep full movement of your hip joint. Follow your caregiver's or physical therapist's instructions. Perform all exercises about fifteen times, three times per day or as directed. Exercise both hips, even if you have had only one joint replacement. These exercises can be done on a training (exercise) mat, on the floor, on a table or on a bed. Use whatever works the best and is most comfortable for you. Use music or television while you are exercising so that the exercises are a pleasant break in your day. This will make your life better   with the exercises acting as a break in routine you can look forward to.  Lying on your back, slowly slide your foot toward your buttocks, raising your knee up off the floor. Then slowly slide your foot back down until your leg is straight again.  Lying on your back spread your legs as far apart as you can without causing discomfort.  Lying on your side, raise your upper leg and foot straight up from the floor as far as is comfortable. Slowly lower the leg and repeat.  Lying on your back, tighten up the muscle  in the front of your thigh (quadriceps muscles). You can do this by keeping your leg straight and trying to raise your heel off the floor. This helps strengthen the largest muscle supporting your knee.  Lying on your back, tighten up the muscles of your buttocks both with the legs straight and with the knee bent at a comfortable angle while keeping your heel on the floor.   SKILLED REHAB INSTRUCTIONS: If the patient is transferred to a skilled rehab facility following release from the hospital, a list of the current medications will be sent to the facility for the patient to continue.  When discharged from the skilled rehab facility, please have the facility set up the patient's Home Health Physical Therapy prior to being released. Also, the skilled facility will be responsible for providing the patient with their medications at time of release from the facility to include their pain medication and their blood thinner medication. If the patient is still at the rehab facility at time of the two week follow up appointment, the skilled rehab facility will also need to assist the patient in arranging follow up appointment in our office and any transportation needs.  MAKE SURE YOU:  Understand these instructions.  Will watch your condition.  Will get help right away if you are not doing well or get worse.  Pick up stool softner and laxative for home use following surgery while on pain medications. Daily dry dressing changes as needed. In 4 days, you may remove your dressings and begin taking showers - no tub baths or soaking the incisions. Continue to use ice for pain and swelling after surgery. Do not use any lotions or creams on the incision until instructed by your surgeon.   Information on my medicine - ELIQUIS (apixaban)  Why was Eliquis prescribed for you? Eliquis was prescribed for you to reduce the risk of blood clots forming after orthopedic surgery.    What do You need to know about  Eliquis? Take your Eliquis TWICE DAILY - one tablet in the morning and one tablet in the evening with or without food.  It would be best to take the dose about the same time each day.  If you have difficulty swallowing the tablet whole please discuss with your pharmacist how to take the medication safely.  Take Eliquis exactly as prescribed by your doctor and DO NOT stop taking Eliquis without talking to the doctor who prescribed the medication.  Stopping without other medication to take the place of Eliquis may increase your risk of developing a clot.  After discharge, you should have regular check-up appointments with your healthcare provider that is prescribing your Eliquis.  What do you do if you miss a dose? If a dose of ELIQUIS is not taken at the scheduled time, take it as soon as possible on the same day and twice-daily administration should be resumed.  The dose should not   be doubled to make up for a missed dose.  Do not take more than one tablet of ELIQUIS at the same time.  Important Safety Information A possible side effect of Eliquis is bleeding. You should call your healthcare provider right away if you experience any of the following: Bleeding from an injury or your nose that does not stop. Unusual colored urine (red or dark brown) or unusual colored stools (red or black). Unusual bruising for unknown reasons. A serious fall or if you hit your head (even if there is no bleeding).  Some medicines may interact with Eliquis and might increase your risk of bleeding or clotting while on Eliquis. To help avoid this, consult your healthcare provider or pharmacist prior to using any new prescription or non-prescription medications, including herbals, vitamins, non-steroidal anti-inflammatory drugs (NSAIDs) and supplements.  This website has more information on Eliquis (apixaban): http://www.eliquis.com/eliquis/home   

## 2021-11-28 NOTE — Anesthesia Procedure Notes (Signed)
Procedure Name: Intubation Date/Time: 11/28/2021 7:49 AM Performed by: Sharlette Dense, CRNA Pre-anesthesia Checklist: Patient identified, Emergency Drugs available, Suction available and Patient being monitored Patient Re-evaluated:Patient Re-evaluated prior to induction Oxygen Delivery Method: Circle system utilized Preoxygenation: Pre-oxygenation with 100% oxygen Induction Type: IV induction Ventilation: Mask ventilation without difficulty Laryngoscope Size: Miller and 2 Grade View: Grade I Tube type: Oral Tube size: 7.0 mm Number of attempts: 1 Airway Equipment and Method: Stylet and Oral airway Placement Confirmation: ETT inserted through vocal cords under direct vision, positive ETCO2 and breath sounds checked- equal and bilateral Secured at: 21 cm Tube secured with: Tape Dental Injury: Teeth and Oropharynx as per pre-operative assessment

## 2021-11-28 NOTE — Op Note (Signed)
OPERATIVE REPORT  SURGEON: Samson Frederic, MD   ASSISTANT: Staff  PREOPERATIVE DIAGNOSIS: Right intertrochanteric femur fracture.   POSTOPERATIVE DIAGNOSIS: Right intertrochanteric femur fracture.   PROCEDURE: Intramedullary fixation, Right femur.   IMPLANTS: Biomet Affixus Hip Fracture Nail, 11 by 180 mm, 125 degrees. 10.5 x 90 mm Hip Fracture Nail Lag Screw. 5 x 36 mm distal interlocking screw 1.  ANESTHESIA:  General  ESTIMATED BLOOD LOSS:-75 mL    ANTIBIOTICS: 2 g Ancef.  DRAINS: None.  COMPLICATIONS: None.   CONDITION: PACU - hemodynamically stable.   BRIEF CLINICAL NOTE: Nancy Suarez is a 88 y.o. female who presented with an intertrochanteric femur fracture. The patient was admitted to the hospitalist service and underwent perioperative risk stratification and medical optimization. The risks, benefits, and alternatives to the procedure were explained, and the patient/daughter elected to proceed.  PROCEDURE IN DETAIL: Surgical site was marked by myself. The patient was taken to the operating room and anesthesia was induced on the bed. The patient was then transferred to the [facility] table and the nonoperative lower extremity was scissored underneath the operative side. The fracture was reduced with traction, internal rotation, and adduction.  A crutch was placed under the femoral shaft in order to correct posterior sag.  The hip was prepped and draped in the normal sterile surgical fashion. Timeout was called verifying side and site of surgery. Preop antibiotics were given with 60 minutes of beginning the procedure.  Fluoroscopy was used to define the patient's anatomy.  Due to anterior translation of the proximal fracture fragment /apex anterior deformity, a stab incision was created over the greater trochanter, and a Cobb was placed subperiosteally.  A 4 cm incision was made just proximal to the tip of the greater trochanter. The awl was used to obtain the standard starting  point for a trochanteric entry nail under fluoroscopic control. The guidepin was placed. The entry reamer was used to open the proximal femur.  On the back table, the nail was assembled onto the jig. The nail was placed into the femur without any difficulty. Through a separate stab incision, the cannula was placed down to the bone in preparation for the cephalomedullary device. A guidepin was placed into the femoral head using AP and lateral fluoroscopy views. The pin was measured, and then reaming was performed to the appropriate depth. The lag screw was inserted to the appropriate depth. The fracture was compressed through the jig. The setscrew was tightened and then loosened one quarter turn. A separate stab incision was created, and the distal interlocking screw was placed using standard AO technique. The jig was removed. Final AP and lateral fluoroscopy views were obtained to confirm fracture reduction and hardware placement. Tip apex distance was appropriate. There was no chondral penetration.  The wounds were copiously irrigated with saline. The wound was closed in layers with #1 Vicryl for the fascia, 2-0 Monocryl for the deep dermal layer, and staples plus Dermabond for the skin. Once the glue was fully dried, sterile dressing was applied. The patient was then awakened from anesthesia and taken to the PACU in stable condition. Sponge needle and instrument counts were correct at the end of the case 2. There were no known complications.  POSTOPERATIVE PLAN: We will readmit the patient to the hospitalist. Weightbearing status will be weightbearing as tolerated with a walker.  Due to aspirin allergy, we will begin apixaban 2.5 mg p.o. twice daily for DVT prophylaxis tomorrow morning. The patient will mobilize out of bed with  physical therapy and undergo disposition planning.  Return to the office for routine 2-week postoperative care.

## 2021-11-28 NOTE — TOC Initial Note (Signed)
Transition of Care [facility]) - Initial/Assessment Note    Patient Details  Name: ROCIO Suarez MRN: [id number] Date of Birth: Jul 22, 1931  [facility]) CM/SW Contact:    Nancy Phi, RN Phone Number: 11/28/2021, 1:15 PM  Clinical Narrative: Will await PT recc. Continue to follow.                 Expected Discharge Plan:  (TBD) Barriers to Discharge: Continued Medical Work up   Patient Goals and CMS Choice Patient states their goals for this hospitalization and ongoing recovery are:: Home CMS Medicare.gov Compare Post Acute Care list provided to:: Patient Represenative (must comment) Choice offered to / list presented to : Nancy Suarez  Expected Discharge Plan and Services Expected Discharge Plan:  (TBD)   Discharge Planning Services: CM Consult Post Acute Care Choice: [place] Living arrangements for the past 2 months: Single Family Home                                      Prior Living Arrangements/Services Living arrangements for the past 2 months: Single Family Home Lives with:: Nancy Suarez Patient language and need for interpreter reviewed:: Yes Do you feel safe going back to the place where you live?: Yes      Need for Family Participation in Patient Care: Yes (Comment) Care giver support system in place?: Yes (comment) Current home services: DME (old rw) Criminal Activity/Legal Involvement Pertinent to Current Situation/Hospitalization: No - Comment as needed  Activities of Daily Living Home Assistive Devices/Equipment: Blood pressure cuff, Electric scooter, Scales, Environmental consultant (specify type), Eyeglasses, Wheelchair, Raised toilet seat with rails, Shower chair without back ADL Screening (condition at time of admission) Patient's cognitive ability adequate to safely complete daily activities?: No Is the patient deaf or have difficulty hearing?: No Does the patient have difficulty seeing, even when wearing glasses/contacts?:  No Does the patient have difficulty concentrating, remembering, or making decisions?: Yes Patient able to express need for assistance with ADLs?: Yes Does the patient have difficulty dressing or bathing?: Yes Independently performs ADLs?: No Communication: Independent, Appropriate for developmental age Dressing (OT): Appropriate for developmental age, Needs assistance Is this a change from baseline?: Pre-admission baseline Grooming: Needs assistance, Appropriate for developmental age Is this a change from baseline?: Pre-admission baseline Feeding: Independent Bathing: Needs assistance Is this a change from baseline?: Pre-admission baseline Toileting: Independent In/Out Bed: Independent Walks in Home: Independent Does the patient have difficulty walking or climbing stairs?: Yes Weakness of Legs: Both Weakness of Arms/Hands: Both  Permission Sought/Granted Permission sought to share information with : Case Manager Permission granted to share information with : Yes, Verbal Permission Granted  Share Information with NAME: Case manager           Emotional Assessment              Admission diagnosis:  Hip fracture ([place]) [S72.009A] AKI (acute kidney injury) ([place]) [N17.9] Fall, initial encounter [W19.XXXA] Closed displaced intertrochanteric fracture of right femur, initial encounter ([place]) [S72.141A] Anemia, unspecified type [D64.9] Patient Active Problem List   Diagnosis Date Noted   Hip fracture ([place]) 11/27/2021   Normocytic anemia 11/27/2021   Physical deconditioning 04/16/2021   Essential hypertension 06/05/2017   HLD (hyperlipidemia) 06/05/2017   Depression 06/05/2017   Iron deficiency anemia 06/05/2017   Acute renal failure superimposed on stage 3b chronic kidney disease ([place]) 06/05/2017   Closed fracture of left  distal femur ([place]) 06/05/2017   Fall    Overweight (BMI 25.0-29.9) 01/21/2014   Postoperative anemia due to acute blood loss 01/21/2014   S/P left TKA  01/19/2014   Scoliosis    Osteopenia    Chronic insomnia    Atrophic vaginitis    Spinal arthritis    GERD (gastroesophageal reflux disease)    ASCUS (atypical squamous cells of undetermined significance) on Pap smear    Ovarian cyst    LGSIL (low grade squamous intraepithelial dysplasia)    PCP:  Nancy Liberty, MD Pharmacy:   [facility] [place], [place] - [phone] [place] [address] Phone: [phone] Fax: [phone]     Social Determinants of Health (SDOH) Interventions    Readmission Risk Interventions     View : No data to display.

## 2021-11-28 NOTE — Transfer of Care (Signed)
Immediate Anesthesia Transfer of Care Note  Patient: Nancy Suarez  Procedure(s) Performed: INTRAMEDULLARY (IM) NAIL INTERTROCHANTRIC (Right)  Patient Location: PACU  Anesthesia Type:General  Level of Consciousness: drowsy  Airway & Oxygen Therapy: Patient Spontanous Breathing and Patient connected to face mask oxygen  Post-op Assessment: Report given to RN and Post -op Vital signs reviewed and stable  Post vital signs: Reviewed and stable  Last Vitals:  Vitals Value Taken Time  BP 153/125 11/28/21 0924  Temp    Pulse    Resp 19 11/28/21 0927  SpO2    Vitals shown include unvalidated device data.  Last Pain:  Vitals:   11/28/21 0520  TempSrc: Axillary  PainSc:       Patients Stated Pain Goal: 0 ([id number])  Complications: No notable events documented.

## 2021-11-28 NOTE — Interval H&P Note (Signed)
History and Physical Interval Note:  11/28/2021 7:17 AM  Nancy Suarez  has presented today for surgery, with the diagnosis of RIGHT HIP FRACTURE.  The various methods of treatment have been discussed with the patient and family. After consideration of risks, benefits and other options for treatment, the patient has consented to  Procedure(s): INTRAMEDULLARY (IM) NAIL INTERTROCHANTRIC (Right) as a surgical intervention.  The patient's history has been reviewed, patient examined, no change in status, stable for surgery.  I have reviewed the patient's chart and labs.  Questions were answered to the patient's satisfaction.     Hilton Cork Hassel Uphoff

## 2021-11-28 NOTE — Progress Notes (Signed)
Patient is currently alert and oriented x4, calm, and cooperative. Violent wrist restraints d/c'ed via verbal order by MD.   Layla Maw, RN

## 2021-11-29 ENCOUNTER — Encounter (HOSPITAL_COMMUNITY): Payer: Self-pay | Admitting: Orthopedic Surgery

## 2021-11-29 DIAGNOSIS — S72001A Fracture of unspecified part of neck of right femur, initial encounter for closed fracture: Secondary | ICD-10-CM | POA: Diagnosis not present

## 2021-11-29 DIAGNOSIS — D649 Anemia, unspecified: Secondary | ICD-10-CM | POA: Diagnosis not present

## 2021-11-29 DIAGNOSIS — F339 Major depressive disorder, recurrent, unspecified: Secondary | ICD-10-CM | POA: Diagnosis not present

## 2021-11-29 DIAGNOSIS — N170 Acute kidney failure with tubular necrosis: Secondary | ICD-10-CM | POA: Diagnosis not present

## 2021-11-29 LAB — COMPREHENSIVE METABOLIC PANEL
ALT: 11 U/L (ref 0–44)
AST: 17 U/L (ref 15–41)
Albumin: 2.6 g/dL — ABNORMAL LOW (ref 3.5–5.0)
Alkaline Phosphatase: 52 U/L (ref 38–126)
Anion gap: 5 (ref 5–15)
BUN: 33 mg/dL — ABNORMAL HIGH (ref 8–23)
CO2: 25 mmol/L (ref 22–32)
Calcium: 8.2 mg/dL — ABNORMAL LOW (ref 8.9–10.3)
Chloride: 108 mmol/L (ref 98–111)
Creatinine, Ser: 1.84 mg/dL — ABNORMAL HIGH (ref 0.44–1.00)
GFR, Estimated: 26 mL/min — ABNORMAL LOW (ref >60)
Glucose, Bld: 122 mg/dL — ABNORMAL HIGH (ref 70–99)
Potassium: 4.6 mmol/L (ref 3.5–5.1)
Sodium: 138 mmol/L (ref 135–145)
Total Bilirubin: 0.7 mg/dL (ref 0.3–1.2)
Total Protein: 5.2 g/dL — ABNORMAL LOW (ref 6.5–8.1)

## 2021-11-29 LAB — HEMOGLOBIN AND HEMATOCRIT, BLOOD
HCT: 23.8 % — ABNORMAL LOW (ref 36.0–46.0)
Hemoglobin: 7.5 g/dL — ABNORMAL LOW (ref 12.0–15.0)

## 2021-11-29 LAB — CBC
HCT: 19.9 % — ABNORMAL LOW (ref 36.0–46.0)
Hemoglobin: 6.3 g/dL — CL (ref 12.0–15.0)
MCH: 29.3 pg (ref 26.0–34.0)
MCHC: 31.7 g/dL (ref 30.0–36.0)
MCV: 92.6 fL (ref 80.0–100.0)
Platelets: 123 10*3/uL — ABNORMAL LOW (ref 150–400)
RBC: 2.15 MIL/uL — ABNORMAL LOW (ref 3.87–5.11)
RDW: 14.2 % (ref 11.5–15.5)
WBC: 7.5 10*3/uL (ref 4.0–10.5)
nRBC: 0 % (ref 0.0–0.2)

## 2021-11-29 LAB — TSH: TSH: 1.233 u[IU]/mL (ref 0.350–4.500)

## 2021-11-29 LAB — PREPARE RBC (CROSSMATCH)

## 2021-11-29 LAB — AMMONIA: Ammonia: 23 umol/L (ref 9–35)

## 2021-11-29 MED ORDER — TRAMADOL HCL 50 MG PO TABS
25.0000 mg | ORAL_TABLET | Freq: Two times a day (BID) | ORAL | Status: DC | PRN
  Administered 2021-11-29: 25 mg via ORAL
  Filled 2021-11-29: qty 1

## 2021-11-29 MED ORDER — TRAMADOL HCL 50 MG PO TABS
25.0000 mg | ORAL_TABLET | Freq: Once | ORAL | Status: AC
  Administered 2021-11-29: 25 mg via ORAL
  Filled 2021-11-29: qty 1

## 2021-11-29 MED ORDER — SIMETHICONE 80 MG PO CHEW
80.0000 mg | CHEWABLE_TABLET | Freq: Once | ORAL | Status: AC
  Administered 2021-11-29: 80 mg via ORAL
  Filled 2021-11-29: qty 1

## 2021-11-29 MED ORDER — SODIUM CHLORIDE 0.9% IV SOLUTION: Freq: Once | INTRAVENOUS | Status: AC

## 2021-11-29 MED ORDER — TRAMADOL HCL 50 MG PO TABS
25.0000 mg | ORAL_TABLET | Freq: Four times a day (QID) | ORAL | Status: DC | PRN
Start: 2021-11-29 — End: 2021-11-29

## 2021-11-29 NOTE — Evaluation (Signed)
Physical Therapy Evaluation Patient Details Name: Nancy Suarez MRN: [id number] DOB: 1932-02-22 Today's Date: 11/29/2021  History of Present Illness  88 year old female admitted from home after a  fall.  She suffered a right hip fracture. orthopedics consulted. pt is  s/p IM nail per Dr Lyla Glassing on 11/28/21.  Pt with post op ABLA with Hgb 6.3. transfused one unit 11/29/21. PMH: scoliosis,  hypertension, hyperlipidemia, chronic kidney disease stage IIIb, L TKA, L periprosthetic femur fx with ORIF 06/09/17.  Clinical Impression  Pt admitted with above diagnosis.  Pt requiring 2 assist for bed mobility and sit<.stand transfers, limited by pain, fatigue and cognition.  Recommend SNF post acute   Pt currently with functional limitations due to the deficits listed below (see PT Problem List). Pt will benefit from skilled PT to increase their independence and safety with mobility to allow discharge to the venue listed below.          Recommendations for follow up therapy are one component of a multi-disciplinary discharge planning process, led by the attending physician.  Recommendations may be updated based on patient status, additional functional criteria and insurance authorization.  Follow Up Recommendations Skilled nursing-short term rehab (<3 hours/day)    Assistance Recommended at Discharge Frequent or constant Supervision/Assistance  Patient can return home with the following  Two people to help with walking and/or transfers;Two people to help with bathing/dressing/bathroom;Assistance with cooking/housework;Direct supervision/assist for medications management;Help with stairs or ramp for entrance;Direct supervision/assist for financial management;Assist for transportation    Equipment Recommendations None recommended by PT  Recommendations for Other Services       Functional Status Assessment Patient has had a recent decline in their functional status and demonstrates the ability to make  significant improvements in function in a reasonable and predictable amount of time.     Precautions / Restrictions Precautions Precautions: Fall Restrictions Weight Bearing Restrictions: No RLE Weight Bearing: Weight bearing as tolerated      Mobility  Bed Mobility Overal bed mobility: Needs Assistance Bed Mobility: Supine to Sit, Sit to Supine     Supine to sit: Total assist Sit to supine: +2 for physical assistance, +2 for safety/equipment, Total assist, Max assist   General bed mobility comments: multi-modal cues for technique and sequence, assist with LEs off bed and to elevate trunk, incr time, bed pad utilized to comeplete scoot to EOB; total assist of 2 to return to supine    Transfers Overall transfer level: Needs assistance Equipment used: Rolling walker (2 wheels) Transfers: Sit to/from Stand Sit to Stand: +2 physical assistance, +2 safety/equipment, Max assist           General transfer comment: STS x2 from bed and BSC. incontinent of stool. assist with anterior-superior wt shift, cues for hand placement and self assist; bed pad used on 1st trial to extend hips. pt able to support self with min assist once in standing, very narrow BOS.  environment manipulated to bring surface to pt as she was unable to wt shift to pivot or take steps    Ambulation/Gait               General Gait Details: unable  Stairs            Wheelchair Mobility    Modified Rankin (Stroke Patients Only)       Balance Overall balance assessment: Needs assistance, History of Falls Sitting-balance support: Single extremity supported, Bilateral upper extremity supported, Feet supported Sitting balance-Leahy Scale: Poor   Postural control:  Posterior lean, Left lateral lean Standing balance support: Bilateral upper extremity supported, Reliant on assistive device for balance Standing balance-Leahy Scale: Poor                               Pertinent  Vitals/Pain Pain Assessment Pain Assessment: Faces Faces Pain Scale: Hurts whole lot Pain Location: R hip and thigh Pain Descriptors / Indicators: Aching, Sore Pain Intervention(s): Limited activity within patient's tolerance, Monitored during session, Premedicated before session, Repositioned    Home Living Family/patient expects to be discharged to:: Unsure Living Arrangements: Children               Home Equipment: Conservation officer, nature (2 wheels) Additional Comments: pt reports she lives with her dtr, [name]. pt is not able to accurately give info regarding her home environment    Prior Function               Mobility Comments: reports mod I amb with RW ADLs Comments: reports that her dtr assists with ADLs     Hand Dominance        Extremity/Trunk Assessment   Upper Extremity Assessment Upper Extremity Assessment: Defer to OT evaluation    Lower Extremity Assessment Lower Extremity Assessment: Generalized weakness;LLE deficits/detail;RLE deficits/detail RLE Deficits / Details: resistant to imposed movement, limited active knee and hip flexion to ~ 20 degrees; RLE: Unable to fully assess due to pain       Communication      Cognition Arousal/Alertness: Awake/alert Behavior During Therapy: WFL for tasks assessed/performed Overall Cognitive Status: Impaired/Different from baseline Area of Impairment: Orientation, Following commands, Problem solving, Safety/judgement, Memory, Attention                 Orientation Level: Disoriented to, Situation Current Attention Level: Focused Memory: Decreased short-term memory Following Commands: Follows one step commands inconsistently Safety/Judgement: Decreased awareness of safety, Decreased awareness of deficits   Problem Solving: Slow processing, Decreased initiation, Difficulty sequencing, Requires verbal cues, Requires tactile cues General Comments: pt is tangential at times, requires frequent redirection to  taks, perseverating when her dtr "maria" will bring her clothes        General Comments      Exercises     Assessment/Plan    PT Assessment Patient needs continued PT services  PT Problem List Decreased strength;Decreased mobility;Decreased safety awareness;Decreased range of motion;Decreased activity tolerance;Decreased balance;Decreased knowledge of use of DME;Pain;Decreased cognition       PT Treatment Interventions DME instruction;Therapeutic exercise;Gait training;Functional mobility training;Therapeutic activities;Patient/family education;Balance training    PT Goals (Current goals can be found in the Care Plan section)  Acute Rehab PT Goals Patient Stated Goal: none stated PT Goal Formulation: Patient unable to participate in goal setting Time For Goal Achievement: 12/13/21 Potential to Achieve Goals: Fair    Frequency Min 3X/week     Co-evaluation               AM-PAC PT "6 Clicks" Mobility  Outcome Measure Help needed turning from your back to your side while in a flat bed without using bedrails?: Total Help needed moving from lying on your back to sitting on the side of a flat bed without using bedrails?: Total Help needed moving to and from a bed to a chair (including a wheelchair)?: Total Help needed standing up from a chair using your arms (e.g., wheelchair or bedside chair)?: Total Help needed to walk in hospital room?: Total Help needed  climbing 3-5 steps with a railing? : Total 6 Click Score: 6    End of Session Equipment Utilized During Treatment: Gait belt Activity Tolerance: Patient limited by pain;Patient limited by fatigue Patient left: with call bell/phone within reach;in bed;with bed alarm set Nurse Communication: Mobility status PT Visit Diagnosis: Unsteadiness on feet (R26.81);Difficulty in walking, not elsewhere classified (R26.2);Muscle weakness (generalized) (M62.81);History of falling (Z91.81);Repeated falls (R29.6);Pain Pain -  Right/Left: Right Pain - part of body: Hip    Time: 1206-1236 PT Time Calculation (min) (ACUTE ONLY): 30 min   Charges:   PT Evaluation $PT Eval Low Complexity: 1 Low PT Treatments $Therapeutic Activity: 8-22 mins        Baxter Flattery, PT  Acute Rehab Dept ([place]) [phone] Pager [phone]  11/29/2021   [facility] 11/29/2021, 2:06 PM

## 2021-11-29 NOTE — Progress Notes (Signed)
    Subjective:  Patient reports pain as mild to moderate.  Patient states she was a little nauseous this morning but the medicine is helping her and she has been able to drink some water. Currently denies N/V/CP/SOB/Dizziness/Abd pain. She does report some pain just with movement. She states her pain is controlled at this time. Denies tingling/numbness in LE bilaterally.   Spoke with patients nurse. Her hemoglobin is 6.3 this morning. 1 unit PRBCs has already been ordered and will be administered ASAP.   Objective:   VITALS:   Vitals:   11/28/21 1324 11/28/21 2133 11/29/21 0126 11/29/21 0555  BP: (!) 150/71 135/60 (!) 121/50 (!) 137/53  Pulse: 79 86 85 90  Resp:  16 20 (!) 24  Temp: 97.8 F (36.6 C) 97.9 F (36.6 C) 98.4 F (36.9 C) 98 F (36.7 C)  TempSrc: Oral Oral Oral Oral  SpO2: 100% 96% 94% 97%  Weight:      Height:        Patient lying comfortably in bed. NAD. ABD soft Neurovascular intact Sensation intact distally Intact pulses distally Dorsiflexion/Plantar flexion intact No cellulitis present Compartment soft Mepilex dressings C/D/I  Lab Results  Component Value Date   WBC 7.5 11/29/2021   HGB 6.3 (LL) 11/29/2021   HCT 19.9 (L) 11/29/2021   MCV 92.6 11/29/2021   PLT 123 (L) 11/29/2021   BMET    Component Value Date/Time   NA 138 11/29/2021 0437   K 4.6 11/29/2021 0437   CL 108 11/29/2021 0437   CO2 25 11/29/2021 0437   GLUCOSE 122 (H) 11/29/2021 0437   BUN 33 (H) 11/29/2021 0437   CREATININE 1.84 (H) 11/29/2021 0437   CALCIUM 8.2 (L) 11/29/2021 0437   GFRNONAA 26 (L) 11/29/2021 0437     Assessment/Plan: 1 Day Post-Op   Principal Problem:   Hip fracture (HCC) Active Problems:   GERD (gastroesophageal reflux disease)   Essential hypertension   HLD (hyperlipidemia)   Depression   Acute renal failure superimposed on stage 3b chronic kidney disease ([place])   Fall   Normocytic anemia S/p IM fixation right femur from introchanteric femur  fracture.    ABLA, hemoglobin 6.3 this am. She is to receive 1 unit PRBCs. Continue to monitor. Transfuse if hemoglobin <7.0.   WBAT with walker DVT ppx: Eliquis to start today due to aspirin allergy, SCDs, TEDS PO pain control: Scheduled tylenol due to delirium. Oxycodone PRN severe pain.  PT/OT: PT has not seen yet. To come today.  Dispo: Per hospitalist. Continue to monitor hemoglobin. Continue PT.    Charlott Rakes, PA-C 11/29/2021, 7:34 AM  [facility]  [place] [address], [place], [place] [address] Phone: [phone] [email or web address] Facebook  [employer]

## 2021-11-29 NOTE — Plan of Care (Signed)
  Problem: Education: Goal: Verbalization of understanding the information provided (i.e., activity precautions, restrictions, etc) will improve Outcome: Progressing   Problem: Activity: Goal: Ability to ambulate and perform ADLs will improve Outcome: Progressing   Problem: Clinical Measurements: Goal: Postoperative complications will be avoided or minimized Outcome: Progressing   Problem: Pain Management: Goal: Pain level will decrease Outcome: Progressing   

## 2021-11-29 NOTE — Progress Notes (Signed)
OT Cancellation Note  Patient Details Name: Nancy Suarez MRN: [id number] DOB: June 12, 1932   Cancelled Treatment:    Reason Eval/Treat Not Completed: Patient not medically ready;Other (comment) (Pt has a hemoglobin of 6.6, to recieve a unit of PRBC. OT will re-attempt when medically ready.)  Oleta Mouse, OTD OTR/L  11/29/21, 8:34 AM

## 2021-11-29 NOTE — [place] FL2 (Signed)
[place] MEDICAID FL2 LEVEL OF CARE SCREENING TOOL     IDENTIFICATION  Patient Name: Nancy Suarez Birthdate: [birth year] Sex: female Admission Date (Current Location): 11/27/2021  [facility] and [place] Number:  Herbalist and Address:  [facility],  [place] [address], [place]      Provider Number: [id number]  Attending Physician Name and Address:  Kathie Dike, MD  Relative Name and Phone Number:  Roseanna Rainbow dtr [phone]    Current Level of Care: Hospital Recommended Level of Care: [place] Prior Approval Number:    Date Approved/Denied:   PASRR Number: [id number]  Discharge Plan: SNF    Current Diagnoses: Patient Active Problem List   Diagnosis Date Noted   Hip fracture ([place]) 11/27/2021   Normocytic anemia 11/27/2021   Physical deconditioning 04/16/2021   Essential hypertension 06/05/2017   HLD (hyperlipidemia) 06/05/2017   Depression 06/05/2017   Iron deficiency anemia 06/05/2017   Acute renal failure superimposed on stage 3b chronic kidney disease ([place]) 06/05/2017   Closed fracture of left distal femur ([place]) 06/05/2017   Fall    Overweight (BMI 25.0-29.9) 01/21/2014   Postoperative anemia due to acute blood loss 01/21/2014   S/P left TKA 01/19/2014   Scoliosis    Osteopenia    Chronic insomnia    Atrophic vaginitis    Spinal arthritis    GERD (gastroesophageal reflux disease)    ASCUS (atypical squamous cells of undetermined significance) on Pap smear    Ovarian cyst    LGSIL (low grade squamous intraepithelial dysplasia)     Orientation RESPIRATION BLADDER Height & Weight     Self, Time, Situation, Place    Continent Weight: 65.1 kg Height:  5\' 5"  (165.1 cm)  BEHAVIORAL SYMPTOMS/MOOD NEUROLOGICAL BOWEL NUTRITION STATUS      Continent Diet (Heart Healthy)  AMBULATORY STATUS COMMUNICATION OF NEEDS Skin   Limited Assist Verbally Normal                       Personal Care Assistance Level of  Assistance  Bathing, Feeding, Dressing Bathing Assistance: Limited assistance Feeding assistance: Limited assistance Dressing Assistance: Limited assistance     Functional Limitations Info  Sight, Hearing, Speech Sight Info: Impaired (eyeglasses) Hearing Info: Adequate Speech Info: Adequate    SPECIAL CARE FACTORS FREQUENCY  PT (By licensed PT), OT (By licensed OT)     PT Frequency:  (5x week) OT Frequency:  (5x week)            Contractures Contractures Info: Not present    Additional Factors Info  Code Status, Allergies Code Status Info:  (Full) Allergies Info:  (Ambien (Zolpidem), Aspirin, Codeine, Namenda (Memantine), Percocet (Oxycodone-acetaminophen), Sulfa Antibiotics)           Current Medications (11/29/2021):  This is the current hospital active medication list Current Facility-Administered Medications  Medication Dose Route Frequency Provider Last Rate Last Admin   0.9 %  sodium chloride infusion (Manually program via Guardrails IV Fluids)   Intravenous Once Rod Can, MD   Held at 11/27/21 2254   0.9 %  sodium chloride infusion   Intravenous Continuous Swinteck, Aaron Edelman, MD   Stopped at 11/28/21 2141   acetaminophen (TYLENOL) tablet 1,000 mg  1,000 mg Oral QID Kathie Dike, MD   1,000 mg at 11/29/21 1458   acetaminophen (TYLENOL) tablet 325-650 mg  325-650 mg Oral Q6H PRN Rod Can, MD   650 mg at 11/29/21 [phone]  apixaban (ELIQUIS) tablet 2.5 mg  2.5 mg Oral BID Rod Can, MD   2.5 mg at 11/29/21 1040   Chlorhexidine Gluconate Cloth 2 % PADS 6 each  6 each Topical Daily Kathie Dike, MD   6 each at 11/29/21 1041   dextromethorphan-guaiFENesin ([place] DM) 30-600 MG per 12 hr tablet 1 tablet  1 tablet Oral BID Kathie Dike, MD   1 tablet at 11/29/21 1040   docusate sodium (COLACE) capsule 100 mg  100 mg Oral BID Rod Can, MD   100 mg at 11/29/21 1040   escitalopram (LEXAPRO) tablet 10 mg  10 mg Oral Daily Swinteck, Aaron Edelman, MD   10  mg at 11/29/21 1040   feeding supplement (ENSURE SURGERY) liquid 237 mL  237 mL Oral BID BM Kathie Dike, MD   237 mL at 11/29/21 1041   haloperidol lactate (HALDOL) injection 1 mg  1 mg Intramuscular Q6H PRN Rod Can, MD   1 mg at 11/27/21 2231   hydrALAZINE (APRESOLINE) injection 10 mg  10 mg Intravenous Q8H PRN Swinteck, Aaron Edelman, MD       lactated ringers infusion   Intravenous Continuous Kathie Dike, MD 100 mL/hr at 11/29/21 1044 New Bag at 11/29/21 1044   LORazepam (ATIVAN) injection 0.5 mg  0.5 mg Intravenous Q12H PRN Rod Can, MD   0.5 mg at 11/27/21 1922   melatonin tablet 10 mg  10 mg Oral QHS Rod Can, MD   10 mg at 11/28/21 2136   menthol-cetylpyridinium (CEPACOL) lozenge 3 mg  1 lozenge Oral PRN Rod Can, MD       Or   phenol (CHLORASEPTIC) mouth spray 1 spray  1 spray Mouth/Throat PRN Swinteck, Aaron Edelman, MD       methocarbamol (ROBAXIN) tablet 500 mg  500 mg Oral Q6H PRN Swinteck, Aaron Edelman, MD       Or   methocarbamol (ROBAXIN) 500 mg in dextrose 5 % 50 mL IVPB  500 mg Intravenous Q6H PRN Swinteck, Aaron Edelman, MD       metoCLOPramide (REGLAN) tablet 5-10 mg  5-10 mg Oral Q8H PRN Swinteck, Aaron Edelman, MD       Or   metoCLOPramide (REGLAN) injection 5-10 mg  5-10 mg Intravenous Q8H PRN Swinteck, Aaron Edelman, MD       mirtazapine (REMERON) tablet 15 mg  15 mg Oral QHS Rod Can, MD   15 mg at 11/28/21 2136   multivitamin with minerals tablet 1 tablet  1 tablet Oral Daily Swinteck, Aaron Edelman, MD   1 tablet at 11/29/21 1040   ondansetron (ZOFRAN) tablet 4 mg  4 mg Oral Q6H PRN Rod Can, MD   4 mg at 11/29/21 0533   Or   ondansetron (ZOFRAN) injection 4 mg  4 mg Intravenous Q6H PRN Swinteck, Aaron Edelman, MD       pantoprazole (PROTONIX) EC tablet 40 mg  40 mg Oral Daily Swinteck, Aaron Edelman, MD   40 mg at 11/29/21 1040   polyethylene glycol (MIRALAX / GLYCOLAX) packet 17 g  17 g Oral Daily PRN Swinteck, Aaron Edelman, MD       polyvinyl alcohol (LIQUIFILM TEARS) 1.4 % ophthalmic  solution 1 drop  1 drop Both Eyes TID PRN Swinteck, Aaron Edelman, MD       QUEtiapine (SEROQUEL) tablet 25 mg  25 mg Oral QHS Kathie Dike, MD   25 mg at 11/28/21 2136   senna (SENOKOT) tablet 8.6 mg  1 tablet Oral BID Rod Can, MD   8.6 mg at 11/29/21 1040   traMADol (ULTRAM)  tablet 25 mg  25 mg Oral Q6H PRN Kathie Dike, MD         Discharge Medications: Please see discharge summary for a list of discharge medications.  Relevant Imaging Results:  Relevant Lab Results:   Additional Information SS#[phone], Juliann Pulse, RN

## 2021-11-29 NOTE — Progress Notes (Addendum)
PROGRESS NOTE    Nancy Suarez  HUT:[id number] DOB: 11-09-1931 DOA: 11/27/2021 PCP: Corine Shelter, MD    Brief Narrative:  88 year old female with a history of hypertension, hyperlipidemia, chronic kidney disease stage IIIb, was at home when she had a slip and fall.  She suffered a right hip fracture.  She was admitted for further operative management.  Also noted to have elevated creatinine which appears to be above baseline.  She receiving IV fluids.   Assessment & Plan:   Principal Problem:   Hip fracture (HCC) Active Problems:   GERD (gastroesophageal reflux disease)   Essential hypertension   HLD (hyperlipidemia)   Depression   Acute renal failure superimposed on stage 3b chronic kidney disease (HCC)   Fall   Normocytic anemia   Right hip fracture Fall -Seen by orthopedics and underwent operative management on 5/29 -Patient to start apixaban for DVT prophylaxis on 5/30 -Continue pain management -Daughter reluctant to use opiate pain medication since patient has not done well on these in the past -We will trial scheduled Tylenol -As needed oxycodone for severe pain -PT/OT  Acute kidney injury on chronic kidney disease stage IIIb -Baseline creatinine appears to be around 1.4 -Admission creatinine noted to be 2.2 -No hydronephrosis on imaging -creatinine currently 1.8 -We will continue on IV fluids -Monitor urine output  Hypertension -Blood pressure currently stable -Holding losartan/hydrochlorothiazide in light of acute kidney injury  Hyperlipidemia -Continue statin  Depression -Continued on Lexapro/mirtazapine  GERD -Continue PPI  Anemia, likely related to chronic kidney disease -No evidence of bleeding -Hgb 8.2 on admission -She received 1 unit of PRBC on admission in prep for surgery -Baseline hemoglobin appears to be between 8-9 -She has been receiving IV fluids and may have some degree of hemodilution -hgb down to 6.3 today, another unit of  prbc ordered today -continue to follow and transfuse for hgb <7  Delirium superimposed on likely mild cognitive impairment -Daughter reports that patient has been sundowning regularly at home for over a year now -She has been following with neurology -Recent MMSE score 21/30 -Noted to be increasingly confused overnight -Possibly related to acute injury from hip fracture and change of environment in the hospital -UA, TSH, ammonia unremarkable -We will use Seroquel at night -much more alert and oriented/appropriate today.   DVT prophylaxis: apixaban (ELIQUIS) tablet 2.5 mg Start: 11/29/21 1000 SCDs Start: 11/28/21 1256 apixaban (ELIQUIS) tablet 2.5 mg  Code Status: Full code Family Communication: Discussed with patient's daughter over the phone Disposition Plan: Status is: Inpatient Remains inpatient appropriate because: Continue postoperative care for hip fracture     Consultants:  Orthopedics  Procedures:  5/29 intramedullary fixation of right femur  Antimicrobials:      Subjective: Patient much more alert, oriented and pleasant today. She says that her pain is manageable, as long as she does not move her leg. No reported agitation overnight.  Objective: Vitals:   11/29/21 0849 11/29/21 0910 11/29/21 0941 11/29/21 1130  BP: (!) 134/50 (!) 147/59 (!) 127/50 (!) 134/55  Pulse: 85 93 90 81  Resp: 17 18 16 18   Temp: 98.7 F (37.1 C) 97.9 F (36.6 C) 98.3 F (36.8 C) 98.6 F (37 C)  TempSrc: Oral Oral Oral Oral  SpO2: 95% 97% 94% 95%  Weight:      Height:        Intake/Output Summary (Last 24 hours) at 11/29/2021 1154 Last data filed at 11/29/2021 1130 Gross per 24 hour  Intake 2661.34 ml  Output  1195 ml  Net 1466.34 ml   Filed Weights   11/27/21 1627  Weight: 65.1 kg    Examination:  General exam: Alert, awake, oriented x 3 Respiratory system: Clear to auscultation. Respiratory effort normal. Cardiovascular system:RRR. No murmurs, rubs,  gallops. Gastrointestinal system: Abdomen is nondistended, soft and nontender. No organomegaly or masses felt. Normal bowel sounds heard. Central nervous system: Alert and oriented. No focal neurological deficits. Extremities: No C/C/E, +pedal pulses Skin: No rashes, lesions or ulcers Psychiatry: Judgement and insight appear normal. Mood & affect appropriate.      Data Reviewed: I have personally reviewed following labs and imaging studies  CBC: Recent Labs  Lab 11/27/21 1111 11/28/21 0930 11/29/21 0437  WBC 8.2 7.9 7.5  NEUTROABS 6.7  --   --   HGB 8.2* 7.7* 6.3*  HCT 26.0* 24.2* 19.9*  MCV 93.2 93.4 92.6  PLT 187 130* [id number]*   Basic Metabolic Panel: Recent Labs  Lab 11/27/21 1111 11/28/21 0930 11/29/21 0437  NA 143 140 138  K 4.8 4.9 4.6  CL 115* 111 108  CO2 25 25 25   GLUCOSE 113* 133* 122*  BUN 38* 30* 33*  CREATININE 2.20* 1.95* 1.84*  CALCIUM 9.2 8.3* 8.2*   GFR: Estimated Creatinine Clearance: 19 mL/min (A) (by C-G formula based on SCr of 1.84 mg/dL (H)). Liver Function Tests: Recent Labs  Lab 11/29/21 0437  AST 17  ALT 11  ALKPHOS 52  BILITOT 0.7  PROT 5.2*  ALBUMIN 2.6*   No results for input(s): LIPASE, AMYLASE in the last 168 hours. Recent Labs  Lab 11/29/21 0437  AMMONIA 23   Coagulation Profile: Recent Labs  Lab 11/27/21 1111  INR 1.2   Cardiac Enzymes: No results for input(s): CKTOTAL, CKMB, CKMBINDEX, TROPONINI in the last 168 hours. BNP (last 3 results) No results for input(s): PROBNP in the last 8760 hours. HbA1C: No results for input(s): HGBA1C in the last 72 hours. CBG: No results for input(s): GLUCAP in the last 168 hours. Lipid Profile: No results for input(s): CHOL, HDL, LDLCALC, TRIG, CHOLHDL, LDLDIRECT in the last 72 hours. Thyroid Function Tests: Recent Labs    11/29/21 0437  TSH 1.233   Anemia Panel: No results for input(s): VITAMINB12, FOLATE, FERRITIN, TIBC, IRON, RETICCTPCT in the last 72 hours. Sepsis  Labs: No results for input(s): PROCALCITON, LATICACIDVEN in the last 168 hours.  Recent Results (from the past 240 hour(s))  MRSA Next Gen by PCR, Nasal     Status: None   Collection Time: 11/28/21  4:52 AM   Specimen: Nasal Mucosa; Nasal Swab  Result Value Ref Range Status   MRSA by PCR Next Gen NOT DETECTED NOT DETECTED Final    Comment: (NOTE) The GeneXpert MRSA Assay (FDA approved for NASAL specimens only), is one component of a comprehensive MRSA colonization surveillance program. It is not intended to diagnose MRSA infection nor to guide or monitor treatment for MRSA infections. Test performance is not FDA approved in patients less than 61 years old. Performed at [facility], [place] [address], [place], [place] [address]          Radiology Studies: US RENAL  Result Date: 11/27/2021 CLINICAL DATA:  Acute kidney injury. EXAM: RENAL / URINARY TRACT ULTRASOUND COMPLETE COMPARISON:  None Available. FINDINGS: Right Kidney: Renal measurements: 5.2 x 5.1 x 5.6 cm = volume: 137 mL. Limited visualization. Grossly normal parenchymal echogenicity. No hydronephrosis. Upper pole cyst measuring 2.8 cm. No other defined masses. Left Kidney: Renal measurements:  7.7 x 4.0 x 4.2 cm = volume: 66.7 mL. Normal parenchymal echogenicity. Cyst arises from the upper pole, 2.9 cm. No other masses. No hydronephrosis. Bladder: Appears normal for degree of bladder distention. Other: None. IMPRESSION: 1. Limited study due to patient is breathing, inability to move and bowel gas. Allowing for this, no acute findings. No hydronephrosis. 2. Bilateral small kidneys, left smaller than the right, consistent with medical renal disease. 3. Single bilateral renal cysts. Electronically Signed   By: Lajean Manes M.D.   On: 11/27/2021 16:10   Pelvis Portable  Result Date: 11/28/2021 CLINICAL DATA:  Status post right intramedullary nail. EXAM: PORTABLE PELVIS 1-2 VIEWS COMPARISON:  None Available.  FINDINGS: Status post right hip intramedullary nail with dynamic screw and a single distal interlocking screw. Subcutaneous emphysema and surgical clips along the lateral aspect of the thigh. Partially imaged intramedullary nail in the left femur. IMPRESSION: Status post right femoral intramedullary nail with dynamic screw. Electronically Signed   By: Keane Police D.O.   On: 11/28/2021 09:44   DG CHEST PORT 1 VIEW  Result Date: 11/27/2021 CLINICAL DATA:  Cough for 1 week.  Right hip fracture. EXAM: PORTABLE CHEST 1 VIEW COMPARISON:  04/13/2021. FINDINGS: Cardiac silhouette is normal in size. No mediastinal or hilar masses. Elevated right hemidiaphragm. Mild linear opacity above the right hemidiaphragm consistent with atelectasis. Subtle focal opacity in the right upper lung, which could reflect a nodule. Lungs otherwise clear. No pleural effusion or pneumothorax. Skeletal structures are demineralized, grossly intact. IMPRESSION: 1. No acute cardiopulmonary disease. 2. Possible right upper lobe nodule. Recommend non urgent follow-up chest CT without contrast for further assessment. Electronically Signed   By: Lajean Manes M.D.   On: 11/27/2021 14:50   DG Knee Complete 4 Views Right  Result Date: 11/27/2021 CLINICAL DATA:  [place] in the bathroom today.  Right knee pain. EXAM: RIGHT KNEE - COMPLETE 4+ VIEW COMPARISON:  None Available. FINDINGS: No fracture or bone lesion. Skeletal structures are diffusely demineralized. Marked narrowing of the medial joint space compartment, milder narrowing of the lateral and patellofemoral joint space compartments. Medial compartment subchondral sclerosis. Marginal osteophytes from all 3 compartments. No joint effusion. Surrounding soft tissues are unremarkable. IMPRESSION: 1. No fracture or acute finding. 2. Advanced osteoarthritis predominantly involving the medial compartment. Electronically Signed   By: Lajean Manes M.D.   On: 11/27/2021 12:08   DG C-Arm 1-60 Min-No  Report  Result Date: 11/28/2021 Fluoroscopy was utilized by the requesting physician.  No radiographic interpretation.   DG HIP PORT UNILAT WITH PELVIS 1V RIGHT  Result Date: 11/28/2021 CLINICAL DATA:  Right hip fluoroscopic image for intramedullary nail placement EXAM: DG HIP (WITH OR WITHOUT PELVIS) 1V PORT RIGHT COMPARISON:  Radiographs dated Nov 27, 2021 FINDINGS: Intraoperative fluoroscopic images for right hip intramedullary nail placement for intertrochanteric fracture. Total fluoroscopic time was 1 minute. Total fluoroscopic dose was 7.99 mGy. IMPRESSION: Intraoperative utilization of fluoroscopy for intramedullary nail placement for ORIF of right intertrochanteric fracture. Electronically Signed   By: Keane Police D.O.   On: 11/28/2021 09:46   DG Hip Unilat With Pelvis 2-3 Views Right  Result Date: 11/27/2021 CLINICAL DATA:  Right hip pain after fall in bathroom today. EXAM: DG HIP (WITH OR WITHOUT PELVIS) 2-3V RIGHT COMPARISON:  None Available. FINDINGS: There is an acute and comminuted intertrochanteric fracture involving the proximal right femur. There is been lateral and superior displacement of the distal fracture fragments. No sign of dislocation. Previous ORIF of the left  femur. IMPRESSION: Acute and comminuted intertrochanteric fracture of the proximal right femur. Electronically Signed   By: Kerby Moors M.D.   On: 11/27/2021 12:07        Scheduled Meds:  sodium chloride   Intravenous Once   acetaminophen  1,000 mg Oral QID   apixaban  2.5 mg Oral BID   Chlorhexidine Gluconate Cloth  6 each Topical Daily   dextromethorphan-guaiFENesin  1 tablet Oral BID   docusate sodium  100 mg Oral BID   escitalopram  10 mg Oral Daily   feeding supplement  237 mL Oral BID BM   melatonin  10 mg Oral QHS   mirtazapine  15 mg Oral QHS   multivitamin with minerals  1 tablet Oral Daily   pantoprazole  40 mg Oral Daily   QUEtiapine  25 mg Oral QHS   senna  1 tablet Oral BID    Continuous Infusions:  sodium chloride Stopped (11/28/21 2141)   lactated ringers 100 mL/hr at 11/29/21 1044   methocarbamol (ROBAXIN) IV       LOS: 2 days    Time spent: 57mins    Kathie Dike, MD [facility]   If 7PM-7AM, please contact night-coverage [email or web address]  11/29/2021, 11:54 AM

## 2021-11-29 NOTE — TOC Progression Note (Signed)
Transition of Care [facility]) - Progression Note    Patient Details  Name: Nancy Suarez MRN: [id number] Date of Birth: [birth year]  Transition of Care [facility]) CM/SW Contact  Henry Utsey, Juliann Pulse, RN Phone Number: 11/29/2021, 3:25 PM  Clinical Narrative:  PT recc SNF-patient/dtr agreed to SNF-faxed out await bed offers.     Expected Discharge Plan: [place] Barriers to Discharge: Continued Medical Work up  Expected Discharge Plan and Services Expected Discharge Plan: [place]   Discharge Planning Services: CM Consult Post Acute Care Choice: [place] Living arrangements for the past 2 months: Single Family Home                                       Social Determinants of Health (SDOH) Interventions    Readmission Risk Interventions     View : No data to display.

## 2021-11-30 DIAGNOSIS — N179 Acute kidney failure, unspecified: Secondary | ICD-10-CM

## 2021-11-30 DIAGNOSIS — D649 Anemia, unspecified: Secondary | ICD-10-CM | POA: Diagnosis not present

## 2021-11-30 LAB — BASIC METABOLIC PANEL
Anion gap: 7 (ref 5–15)
BUN: 28 mg/dL — ABNORMAL HIGH (ref 8–23)
CO2: 23 mmol/L (ref 22–32)
Calcium: 8.6 mg/dL — ABNORMAL LOW (ref 8.9–10.3)
Chloride: 110 mmol/L (ref 98–111)
Creatinine, Ser: 1.51 mg/dL — ABNORMAL HIGH (ref 0.44–1.00)
GFR, Estimated: 33 mL/min — ABNORMAL LOW (ref >60)
Glucose, Bld: 106 mg/dL — ABNORMAL HIGH (ref 70–99)
Potassium: 4.4 mmol/L (ref 3.5–5.1)
Sodium: 140 mmol/L (ref 135–145)

## 2021-11-30 LAB — CBC
HCT: 24.3 % — ABNORMAL LOW (ref 36.0–46.0)
Hemoglobin: 7.7 g/dL — ABNORMAL LOW (ref 12.0–15.0)
MCH: 29.5 pg (ref 26.0–34.0)
MCHC: 31.7 g/dL (ref 30.0–36.0)
MCV: 93.1 fL (ref 80.0–100.0)
Platelets: 121 10*3/uL — ABNORMAL LOW (ref 150–400)
RBC: 2.61 MIL/uL — ABNORMAL LOW (ref 3.87–5.11)
RDW: 14.4 % (ref 11.5–15.5)
WBC: 7.8 10*3/uL (ref 4.0–10.5)
nRBC: 0 % (ref 0.0–0.2)

## 2021-11-30 LAB — TYPE AND SCREEN
ABO/RH(D): O POS
Antibody Screen: NEGATIVE
Unit division: 0
Unit division: 0

## 2021-11-30 LAB — BPAM RBC
Blood Product Expiration Date: 202306262359
Blood Product Expiration Date: 202306262359
ISSUE DATE / TIME: 202305290138
ISSUE DATE / TIME: 202305300835
Unit Type and Rh: 5100
Unit Type and Rh: 5100

## 2021-11-30 MED ORDER — MUSCLE RUB 10-15 % EX CREA: TOPICAL_CREAM | CUTANEOUS | Status: DC | PRN

## 2021-11-30 MED ORDER — APIXABAN 2.5 MG PO TABS: 2.5000 mg | ORAL_TABLET | Freq: Two times a day (BID) | ORAL | 0 refills | Status: DC

## 2021-11-30 NOTE — Progress Notes (Signed)
OT Cancellation Note  Patient Details Name: Nancy Suarez MRN: [id number] DOB: August 04, 1931   Cancelled Treatment:    Reason Eval/Treat Not Completed: Patient's level of consciousness. Attempted twice - patient unable to be aroused.   [name] [name] 11/30/2021, 12:46 PM

## 2021-11-30 NOTE — Progress Notes (Signed)
Pt refused AM labs. Educated pt still refused.

## 2021-11-30 NOTE — Progress Notes (Signed)
  Progress Note   Patient: Nancy Suarez [id number] DOB: [birth year] DOA: 11/27/2021     3 DOS: the patient was seen and examined on 11/30/2021   Brief hospital course: 88 year old female with a history of hypertension, hyperlipidemia, chronic kidney disease stage IIIb, was at home when she had a slip and fall.  She suffered a right hip fracture.  She was admitted for further operative management.  Also noted to have elevated creatinine which appears to be above baseline.  Assessment and Plan: No notes have been filed under this hospital service. Service: Hospitalist Right hip fracture Fall -Seen by orthopedics and underwent operative management on 5/29 -Patient started apixaban for DVT prophylaxis on 5/30 -On trial ofl scheduled Tylenol -Was given trials of ultram, however, pt did not seem to tolerate, resulting in increased agitation and confusion shortly after trial of narcotic -Try to avoid narcotic if possible -PT/OT   Acute kidney injury on chronic kidney disease stage IIIb -Baseline creatinine appears to be around 1.4 -Admission creatinine noted to be 2.2 -No hydronephrosis on imaging -creatinine currently 1.51 -continued on gentle IVF -Monitor urine output   Hypertension -Blood pressure currently stable -Had been holding losartan/hydrochlorothiazide in light of acute kidney injury   Hyperlipidemia -Continue statin   Depression -Continued on Lexapro/mirtazapine   GERD -Continue PPI   Anemia, likely related to chronic kidney disease -No evidence of bleeding -Hgb 8.2 on admission -She received 1 unit of PRBC on admission in prep for surgery -Baseline hemoglobin appears to be between 8-9 -She has been receiving IV fluids and may have some degree of hemodilution -hgb down to 6.3 on 5/30 with pt receiving another unit of prbc -Hgb remains stable at 7.7   Delirium superimposed on likely mild cognitive impairment -Daughter reports that patient has been sundowning  regularly at home for over a year now -She has been following with neurology -Recent MMSE score 21/30 -Noted to be increasingly confused overnight and seems worse with narcotics -Possibly related to acute injury from hip fracture and change of environment in the hospital -UA, TSH, ammonia unremarkable -Cont trial of Seroquel at night -Documentation noted. More confused overnight after trial of narcotic. Would try to avoid if possible       Subjective: Pleasantly confused this AM  Physical Exam: Vitals:   11/29/21 0941 11/29/21 1130 11/29/21 1337 11/29/21 2028  BP: (!) 127/50 (!) 134/55 (!) 157/56 (!) 147/59  Pulse: 90 81 86 92  Resp: 16 18 18  (!) 22  Temp: 98.3 F (36.8 C) 98.6 F (37 C) 98.3 F (36.8 C) 98.7 F (37.1 C)  TempSrc: Oral Oral Oral Oral  SpO2: 94% 95% 95% 93%  Weight:      Height:       General exam: Awake, laying in bed, in nad Respiratory system: Normal respiratory effort, no wheezing Cardiovascular system: regular rate, s1, s2 Gastrointestinal system: Soft, nondistended, positive BS Central nervous system: CN2-12 grossly intact, strength intact Extremities: Perfused, no clubbing Skin: Normal skin turgor, no notable skin lesions seen Psychiatry: Mood normal // no visual hallucinations  Data Reviewed:   Cr 1.51, Hgb 7.7  Family Communication: Pt in room, family not at bedside  Disposition: Status is: Inpatient Remains inpatient appropriate because: Severity of illness  Planned Discharge Destination: Home and Skilled nursing facility     Author: Marylu Lund, MD 11/30/2021 12:17 PM  For on call review [email or web address].
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
## 2021-11-30 NOTE — Plan of Care (Signed)
  Problem: Clinical Measurements: Goal: Postoperative complications will be avoided or minimized Outcome: Progressing   Problem: Pain Management: Goal: Pain level will decrease Outcome: Progressing   

## 2021-11-30 NOTE — Care Management Important Message (Signed)
Important Message  Patient Details IM Letter given to the Patient. Name: WYONA NEILS MRN: [id number] Date of Birth: 21-May-1932   Medicare Important Message Given:  Yes     Caren Macadam 11/30/2021, 12:42 PM

## 2021-11-30 NOTE — Progress Notes (Signed)
Pt refused to let tech take her vital signs.

## 2021-11-30 NOTE — TOC Progression Note (Signed)
Transition of Care [facility]) - Progression Note    Patient Details  Name: Nancy Suarez MRN: [id number] Date of Birth: 03/10/1932  [facility] CM/SW Contact  Keimya Briddell, Olegario Messier, RN Phone Number: 11/30/2021, 10:40 AM  Clinical Narrative:   Bed offers given-await choice.  1. 1.5 mi [facility] [address] [place], [place] [address] [phone] Overall rating Much below average 2. 1.9 mi [facility] [address] [place], [place] [address] [phone] Overall rating Average 3. 2 mi [facility] [address] [place], [place] [address] [phone] Overall rating Below average 4. 2.4 mi [facility] at the [facility] [address] [place], [place] [address] [phone] Overall rating Above average 5. 2.6 mi [facility] [address] [place], [place] [address] [phone] Overall rating Much below average 6. 3.4 mi [facility] [address] [place], [place] [address] [phone] Overall rating Much below average 7. 3.7 mi [facility] [employer] [address] [place], [place] [address] [phone] Overall rating Much above average 8. 3.9 mi [facility] [address] [place], [place] [address] [phone] Overall rating Much above average 9. 4 mi [facility] [address] [place], [place] [address] [phone] Overall rating Average 10. 4.2 mi [facility] and [facility] [address] [place], [place] [address] [phone] Overall rating Much below average 11. 4.6 mi [facility] [address] [place], [place] [address] [phone] Overall rating Much below average 12. 6.3 mi [facility] [address] [place], [place] [address] [phone] Overall rating Above average 13. 9.2 mi [facility] [address] [place], [place] [address] [phone] Overall rating Below average 14. 9.6 mi [facility] [address] [place], [place] [address] [phone] Overall rating Much above average 15. 9.8 mi [facility] [address] [place], [place] [address] [phone] Overall rating Above average 16. 9.9 mi [facility] [address] [place], [place] [address] [phone] Overall rating Much above average 17. 10.7 mi [facility] [address] [place], [place] [address] [phone] Overall rating Much above average 18. 13.3 [facility] [address] [place], [place] [address] [phone] Overall rating Much below average 19. 13.6 mi [facility] [address] [place], [place] [address] [phone] Overall rating Much below average 20. 14.2 mi [facility] [address] [place], [place] [address] [phone] Overall rating Much above average 21. 14.4 mi [facility] [address] [place], [place] [address] [phone] Overall rating Much below average 22. 15 mi [facility] [address] [place], [place] [address] [phone] Overall rating Above average 23. 15.2 mi [facility] [address] [place], [place] [address] [phone] Overall rating Above average 24. 15.3 mi [facility] [employer] [facility] [address] [place], [place] [address] [phone] Overall rating Below average 25. 16.9 mi [facility] [address] [place], [place] [address] [phone] Overall rating Average 26. 18.1 mi [facility] [address] [place], [place] [address] [phone] Overall rating Below average 27.  18.9 mi [facility] [address] [place], [place] [address] [phone] Overall rating Much below average 28. 19.7 mi [facility] and [employer] [facility] [address] [place], [place] [address] [phone] Overall rating Much below average 29. 20.2 mi [address] at the [facility] at [facility], [place] [address] [phone] Overall rating Much above average 30. 20.4 mi [facility] and [facility] [address] [place], [place] [address] [phone] Overall rating Much below average 31. 20.4 mi [facility] [address] [place], [place] [address] [phone] Overall rating Below average 32. 20.6 [facility] [address] [place], [place] [address] [phone] Overall rating Much above average 33. 21.2 mi [address] [place], [place] [address] [phone] Overall rating Below average 34. 22.7 mi [facility] [address] [place], [place] [address] [phone] Overall rating Below average 35. 22.8 mi [employer] [address] [place], [place] [address] [phone] Overall rating Much above average 36. 23.1 mi [facility] [address] [place], [place] [address] [phone] Overall rating Below average 37. 23.5 mi [facility] [address] [place], [place] [address] [phone] Overall rating Above average 38. 23.7 [facility] [address] [place], [place] [address] [phone] Overall rating Not available18 39. 24 mi [employer] [address] [place], [place] [address] [phone] Overall rating Much below average 40. 24.8 mi [facility] [employer] [address] [place], [place] [address] [phone] Overall rating Above average To explore and download nursing home data  Expected Discharge Plan: Skilled Nursing Facility Barriers to Discharge: Continued Medical Work up  Expected Discharge Plan and Services Expected Discharge Plan: Skilled Nursing Facility   Discharge Planning Services: CM Consult Post Acute Care Choice: Skilled Nursing Facility Living arrangements for the past 2 months: Single Family Home                                       Social Determinants of Health (SDOH) Interventions    Readmission Risk Interventions     View : No data to display.

## 2021-11-30 NOTE — Hospital Course (Signed)
88 year old female with a history of hypertension, hyperlipidemia, chronic kidney disease stage IIIb, was at home when she had a slip and fall.  She suffered a right hip fracture.  She was admitted for further operative management.  Also noted to have elevated creatinine which appears to be above baseline.

## 2021-11-30 NOTE — Plan of Care (Signed)
  Problem: Education: Goal: Verbalization of understanding the information provided (i.e., activity precautions, restrictions, etc) will improve Outcome: Progressing   Problem: Clinical Measurements: Goal: Postoperative complications will be avoided or minimized Outcome: Progressing   Problem: Pain Management: Goal: Pain level will decrease Outcome: Progressing   

## 2021-11-30 NOTE — Progress Notes (Signed)
Pt has been very agitated. Banging call bell and paranoid of staff. Writer called daughter to get patient to take meds.

## 2021-11-30 NOTE — Progress Notes (Addendum)
Pt removed telemetry and refused to allow writer to put back on. Pt has also pulled out her IV and has no access at this time. Central telemetry and provider notified. I have given her PRN haldol which seems to be helping.

## 2021-11-30 NOTE — Patient Care Conference (Addendum)
Attempted to call patient's daughter at number listed to give update. No answer. Will try again later.  UPDATE: Daughter was already in patient's room. Went to bedside and updated pt's daughter. Pt was being fed dinner by daughter. All questions answered.

## 2021-12-01 DIAGNOSIS — S72141A Displaced intertrochanteric fracture of right femur, initial encounter for closed fracture: Secondary | ICD-10-CM

## 2021-12-01 DIAGNOSIS — D649 Anemia, unspecified: Secondary | ICD-10-CM | POA: Diagnosis not present

## 2021-12-01 LAB — BASIC METABOLIC PANEL
Anion gap: 6 (ref 5–15)
BUN: 26 mg/dL — ABNORMAL HIGH (ref 8–23)
CO2: 23 mmol/L (ref 22–32)
Calcium: 8.3 mg/dL — ABNORMAL LOW (ref 8.9–10.3)
Chloride: 112 mmol/L — ABNORMAL HIGH (ref 98–111)
Creatinine, Ser: 1.36 mg/dL — ABNORMAL HIGH (ref 0.44–1.00)
GFR, Estimated: 37 mL/min — ABNORMAL LOW (ref >60)
Glucose, Bld: 96 mg/dL (ref 70–99)
Potassium: 4.5 mmol/L (ref 3.5–5.1)
Sodium: 141 mmol/L (ref 135–145)

## 2021-12-01 LAB — CBC
HCT: 22.9 % — ABNORMAL LOW (ref 36.0–46.0)
Hemoglobin: 7.1 g/dL — ABNORMAL LOW (ref 12.0–15.0)
MCH: 29.7 pg (ref 26.0–34.0)
MCHC: 31 g/dL (ref 30.0–36.0)
MCV: 95.8 fL (ref 80.0–100.0)
Platelets: 144 10*3/uL — ABNORMAL LOW (ref 150–400)
RBC: 2.39 MIL/uL — ABNORMAL LOW (ref 3.87–5.11)
RDW: 14.4 % (ref 11.5–15.5)
WBC: 5.7 10*3/uL (ref 4.0–10.5)
nRBC: 0 % (ref 0.0–0.2)

## 2021-12-01 LAB — HEMOGLOBIN AND HEMATOCRIT, BLOOD
HCT: 25.8 % — ABNORMAL LOW (ref 36.0–46.0)
Hemoglobin: 8 g/dL — ABNORMAL LOW (ref 12.0–15.0)

## 2021-12-01 MED ORDER — FERROUS SULFATE 325 (65 FE) MG PO TABS
325.0000 mg | ORAL_TABLET | Freq: Every day | ORAL | Status: DC
  Administered 2021-12-02 – 2021-12-05 (×4): 325 mg via ORAL
  Filled 2021-12-01 (×4): qty 1

## 2021-12-01 MED ORDER — POLYETHYLENE GLYCOL 3350 17 G PO PACK
17.0000 g | PACK | Freq: Every day | ORAL | Status: DC
  Administered 2021-12-02 – 2021-12-05 (×4): 17 g via ORAL
  Filled 2021-12-01 (×4): qty 1

## 2021-12-01 NOTE — TOC Progression Note (Addendum)
Transition of Care [facility]) - Progression Note    Patient Details  Name: Nancy Suarez MRN: [id number] Date of Birth: 1931-09-27  Transition of [facility]) CM/SW Contact  Avish Torry, Olegario Messier, RN Phone Number: 12/01/2021, 11:44 AM  Clinical Narrative:   Will need PT/OT to see/place note patient today prior starting auth.MD updated.Facility chosen will let me know if bed available for tomorrow. -1p [facility] chosen bed available Monday. TOC Start auth on Sunday. -3p-PT to see patient Saturday for TOC to  get auth on Sunday.    Expected Discharge Plan: Skilled Nursing Facility Barriers to Discharge: Continued Medical Work up  Expected Discharge Plan and Services Expected Discharge Plan: Skilled Nursing Facility   Discharge Planning Services: CM Consult Post Acute Care Choice: Skilled Nursing Facility Living arrangements for the past 2 months: Single Family Home                                       Social Determinants of Health (SDOH) Interventions    Readmission Risk Interventions     View : No data to display.

## 2021-12-01 NOTE — Progress Notes (Signed)
Progress Note   Patient: Nancy Suarez [id number] DOB: 30-Dec-1931 DOA: 11/27/2021     4 DOS: the patient was seen and examined on 12/01/2021   Brief hospital course: 88 year old female with a history of hypertension, hyperlipidemia, chronic kidney disease stage IIIb, was at home when she had a slip and fall.  She suffered a right hip fracture.  She was admitted for further operative management.  Also noted to have elevated creatinine which appears to be above baseline.  Assessment and Plan: No notes have been filed under this hospital service. Service: Hospitalist Right hip fracture Fall -Seen by orthopedics and underwent operative management on 5/29 -Patient started apixaban for DVT prophylaxis on 5/30 -On trial ofl scheduled Tylenol -Was given trials of ultram, however, pt did not seem to tolerate, resulting in increased agitation and confusion shortly after trial of narcotic -Try to avoid narcotic if possible. Now much more alert and oriented this AM -PT/OT following   Acute kidney injury on chronic kidney disease stage IIIb -Baseline creatinine appears to be around 1.4 -Admission creatinine noted to be 2.2 -No hydronephrosis on imaging -creatinine currently 1.36 -continued on gentle IVF -Monitor urine output   Hypertension -Blood pressure currently stable -Had been holding losartan/hydrochlorothiazide in light of acute kidney injury   Hyperlipidemia -Continue statin   Depression -Continued on Lexapro/mirtazapine   GERD -Continue PPI   Anemia, likely related to chronic kidney disease -No evidence of bleeding -Hgb 8.2 on admission -She received 1 unit of PRBC on admission in prep for surgery -Baseline hemoglobin appears to be between 8-9 -She has been receiving IV fluids and may have some degree of hemodilution -hgb down to 6.3 on 5/30 with pt receiving another unit of prbc -Hgb remains relatively stable at 7.1.  No evidence of acute blood loss at this time -We  will repeat H&H this afternoon, plan to transfuse as needed -We will continue on scheduled iron with bowel regimen   Delirium superimposed on likely mild cognitive impairment -Daughter reports that patient has been sundowning regularly at home for over a year now -She has been following with neurology -Recent MMSE score 21/30 -Noted to be increasingly confused overnight and seems worse with narcotics -Possibly related to acute injury from hip fracture and change of environment in the hospital -UA, TSH, ammonia unremarkable -Cont trial of Seroquel at night -Mentation seems much improved when not receiving narcotic, will try to avoid if possible in the future      Subjective: Very pleasant and oriented this morning.  Physical Exam: Vitals:   11/30/21 1628 11/30/21 1955 12/01/21 0531 12/01/21 1344  BP: (!) 170/64 (!) 143/52 (!) 158/54 (!) 173/60  Pulse: 91 84 74 98  Resp:  16 17 18   Temp:  98.3 F (36.8 C) 98.6 F (37 C)   TempSrc:  Oral    SpO2:  98% 97% 100%  Weight:      Height:       General exam: Conversant, in no acute distress Respiratory system: normal chest rise, clear, no audible wheezing Cardiovascular system: regular rhythm, s1-s2 Gastrointestinal system: Nondistended, nontender, pos BS Central nervous system: No seizures, no tremors Extremities: No cyanosis, no joint deformities Skin: No rashes, no pallor Psychiatry: Affect normal // no auditory hallucinations   Data Reviewed:  Cr 1.36, Hgb 7.1  Family Communication: Pt in room, family not at bedside  Disposition: Status is: Inpatient Remains inpatient appropriate because: Severity of illness  Planned Discharge Destination: Home and Skilled nursing facility  Author: Marylu Lund, MD 12/01/2021 4:58 PM  For on call review [email or web address].

## 2021-12-01 NOTE — Evaluation (Signed)
Occupational Therapy Evaluation Patient Details Name: Nancy Suarez MRN: [id number] DOB: 06-18-1932 Today's Date: 12/01/2021   History of Present Illness 88 year old female admitted from home after a  fall.  She suffered a right hip fracture. orthopedics consulted. pt is  s/p IM nail per Dr Lyla Glassing on 11/28/21.  Pt with post op ABLA with Hgb 6.3. transfused one unit 11/29/21. PMH: scoliosis,  hypertension, hyperlipidemia, chronic kidney disease stage IIIb, L TKA, L periprosthetic femur fx with ORIF 06/09/17.   Clinical Impression   Nancy Suarez is an 88 year old woman s/p hip fracture repair who presents with pain, decreased ROM and strength of RLE, generalized weakness, decreased activity tolerance and impaired balance. On evaluation she required +2 assistance to transfer to edge of bed, stand, and pivot to recliner. She is able to perform  UB ADLs with setup and seated position but needing max-total assist for LB ADLs and toileting. She is significantly limited by pain. She is highly sensitive to pain medications and MD wanting to hold off on narcotics  due to increased lethargy and confusion- so pain control may be difficult  Today patient alert, oriented, agreeable and wanting to get better in order eventually return home. Patient will benefit from skilled OT services while in hospital to improve deficits and learn compensatory strategies as needed in order to return to PLOF.  Recommend short term rehab at discharge.      Recommendations for follow up therapy are one component of a multi-disciplinary discharge planning process, led by the attending physician.  Recommendations may be updated based on patient status, additional functional criteria and insurance authorization.   Follow Up Recommendations  Skilled nursing-short term rehab (<3 hours/day)    Assistance Recommended at Discharge Frequent or constant Supervision/Assistance  Patient can return home with the following Two people to help  with walking and/or transfers;Two people to help with bathing/dressing/bathroom    Functional Status Assessment  Patient has had a recent decline in their functional status and demonstrates the ability to make significant improvements in function in a reasonable and predictable amount of time.  Equipment Recommendations  Other (comment) (Defer to next venue)    Recommendations for Other Services       Precautions / Restrictions Precautions Precautions: Fall Restrictions Weight Bearing Restrictions: Yes RLE Weight Bearing: Weight bearing as tolerated      Mobility Bed Mobility Overal bed mobility: Needs Assistance Bed Mobility: Supine to Sit     Supine to sit: Max assist, +2 for physical assistance     General bed mobility comments: Patient initiating a little to get legs toward edge of bed - but highly limited by pain and beginning to guard and resist against movement thus resulting max x 2 to transfer to edge of bed.    Transfers Overall transfer level: Needs assistance Equipment used: Rolling walker (2 wheels) Transfers: Sit to/from Stand, Bed to chair/wheelchair/BSC Sit to Stand: Mod assist, +2 physical assistance, From elevated surface Stand pivot transfers: Mod assist, +2 physical assistance         General transfer comment: Mod x 2 to stand from elevated bed height. Unable to take a step with first stand and had to return to seated position. Mod x 2 to stand for second step and mod x 2 to take two steps to recliner with patient exhibiting difficulty with advancing RLE and weight shifting.      Balance Overall balance assessment: Needs assistance, History of Falls Sitting-balance support: Feet supported Sitting balance-Leahy  Scale: Fair     Standing balance support: Bilateral upper extremity supported, Reliant on assistive device for balance Standing balance-Leahy Scale: Poor Standing balance comment: reliant on walker and external assist                            ADL either performed or assessed with clinical judgement   ADL Overall ADL's : Needs assistance/impaired Eating/Feeding: Set up;Sitting Eating/Feeding Details (indicate cue type and reason): able to transfer to recilner today for breakfast. Setup required. Grooming: Set up;Sitting   Upper Body Bathing: Set up;Sitting   Lower Body Bathing: Maximal assistance;Sitting/lateral leans   Upper Body Dressing : Set up;Sitting   Lower Body Dressing: Total assistance;Sit to/from stand;+2 for physical assistance   Toilet Transfer: +2 for physical assistance;Moderate assistance;BSC/3in1;Rolling walker (2 wheels)   Toileting- Clothing Manipulation and Hygiene: +2 for physical assistance;Total assistance;Sit to/from stand       Functional mobility during ADLs: Moderate assistance;+2 for physical assistance       Vision Baseline Vision/History: 1 Wears glasses       Perception     Praxis      Pertinent Vitals/Pain Pain Assessment Pain Assessment: Faces Faces Pain Scale: Hurts whole lot Pain Location: R hip and thigh Pain Descriptors / Indicators: Aching, Sore, Grimacing, Guarding Pain Intervention(s): Limited activity within patient's tolerance, Monitored during session, Repositioned     Hand Dominance Right   Extremity/Trunk Assessment Upper Extremity Assessment Upper Extremity Assessment: Overall WFL for tasks assessed   Lower Extremity Assessment Lower Extremity Assessment: Defer to PT evaluation       Communication Communication Communication: No difficulties   Cognition Arousal/Alertness: Awake/alert Behavior During Therapy: WFL for tasks assessed/performed Overall Cognitive Status: Within Functional Limits for tasks assessed                                 General Comments: Alert and oriented today     General Comments       Exercises     Shoulder Instructions      Home Living Family/patient expects to be discharged to::  Skilled nursing facility Living Arrangements: Children                           Home Equipment: [place] (2 wheels)   Additional Comments: pt reports she lives with her dtr, [name]. pt is not able to accurately give info regarding her home environment      Prior Functioning/Environment               Mobility Comments: reports mod I amb with RW ADLs Comments: reports that her dtr assists with ADLs as needed        OT Problem List: Decreased strength;Decreased range of motion;Decreased activity tolerance;Impaired balance (sitting and/or standing);Decreased knowledge of use of DME or AE;Pain;Increased edema      OT Treatment/Interventions:      OT Goals(Current goals can be found in the care plan section) Acute Rehab OT Goals Patient Stated Goal: get better OT Goal Formulation: With patient Time For Goal Achievement: 12/15/21 Potential to Achieve Goals: Good  OT Frequency: Min 2X/week    Co-evaluation              AM-PAC OT "6 Clicks" Daily Activity     Outcome Measure Help from another person eating meals?: A Little Help from  another person taking care of personal grooming?: A Little Help from another person toileting, which includes using toliet, bedpan, or urinal?: Total Help from another person bathing (including washing, rinsing, drying)?: A Lot Help from another person to put on and taking off regular upper body clothing?: A Little Help from another person to put on and taking off regular lower body clothing?: Total 6 Click Score: 13   End of Session Equipment Utilized During Treatment: Gait belt;Rolling walker (2 wheels) Nurse Communication: Mobility status  Activity Tolerance: Patient limited by pain Patient left: in chair;with call bell/phone within reach;with chair alarm set  OT Visit Diagnosis: Pain;Other abnormalities of gait and mobility (R26.89) Pain - Right/Left: Right Pain - part of body: Hip                Time:  [id number] OT Time Calculation (min): 15 min Charges:  OT General Charges $OT Visit: 1 Visit OT Evaluation $OT Eval Low Complexity: 1 Low  Carter Kaman, OTR/L [place]  Office [phone] Pager: [place] 12/01/2021, 12:47 PM

## 2021-12-02 DIAGNOSIS — S72141A Displaced intertrochanteric fracture of right femur, initial encounter for closed fracture: Secondary | ICD-10-CM | POA: Diagnosis not present

## 2021-12-02 DIAGNOSIS — N179 Acute kidney failure, unspecified: Secondary | ICD-10-CM | POA: Diagnosis not present

## 2021-12-02 LAB — CBC
HCT: 24 % — ABNORMAL LOW (ref 36.0–46.0)
Hemoglobin: 7.3 g/dL — ABNORMAL LOW (ref 12.0–15.0)
MCH: 28.9 pg (ref 26.0–34.0)
MCHC: 30.4 g/dL (ref 30.0–36.0)
MCV: 94.9 fL (ref 80.0–100.0)
Platelets: 170 10*3/uL (ref 150–400)
RBC: 2.53 MIL/uL — ABNORMAL LOW (ref 3.87–5.11)
RDW: 14.3 % (ref 11.5–15.5)
WBC: 5.8 10*3/uL (ref 4.0–10.5)
nRBC: 0 % (ref 0.0–0.2)

## 2021-12-02 LAB — COMPREHENSIVE METABOLIC PANEL
ALT: 10 U/L (ref 0–44)
AST: 17 U/L (ref 15–41)
Albumin: 2.6 g/dL — ABNORMAL LOW (ref 3.5–5.0)
Alkaline Phosphatase: 53 U/L (ref 38–126)
Anion gap: 6 (ref 5–15)
BUN: 29 mg/dL — ABNORMAL HIGH (ref 8–23)
CO2: 25 mmol/L (ref 22–32)
Calcium: 8.5 mg/dL — ABNORMAL LOW (ref 8.9–10.3)
Chloride: 108 mmol/L (ref 98–111)
Creatinine, Ser: 1.35 mg/dL — ABNORMAL HIGH (ref 0.44–1.00)
GFR, Estimated: 38 mL/min — ABNORMAL LOW (ref >60)
Glucose, Bld: 110 mg/dL — ABNORMAL HIGH (ref 70–99)
Potassium: 4.3 mmol/L (ref 3.5–5.1)
Sodium: 139 mmol/L (ref 135–145)
Total Bilirubin: 1 mg/dL (ref 0.3–1.2)
Total Protein: 5.4 g/dL — ABNORMAL LOW (ref 6.5–8.1)

## 2021-12-02 MED ORDER — HYDRALAZINE HCL 20 MG/ML IJ SOLN: 10.0000 mg | INTRAMUSCULAR | Status: DC | PRN

## 2021-12-02 NOTE — Progress Notes (Signed)
Physical Therapy Treatment Patient Details Name: Nancy Suarez MRN: [id number] DOB: [birth year] Today's Date: 12/02/2021   History of Present Illness 88 year old female admitted from home after a  fall.  She suffered a right hip fracture. orthopedics consulted. pt is  s/p IM nail per Dr Lyla Glassing on 11/28/21.  Pt with post op ABLA with Hgb 6.3. transfused one unit 11/29/21. PMH: scoliosis,  hypertension, hyperlipidemia, chronic kidney disease stage IIIb, L TKA, L periprosthetic femur fx with ORIF 06/09/17.    PT Comments    Pt making excellent progress this PT session. Amb ~ 77' with min-mod assist and tolerated LE exercises after amb. Pain RLE much improved per pt report. Pt verbalizes she is motivated to continue her rehab and be more independent again.  Continue to recommend SNF post acute. Will follow in acute setting.  Recommendations for follow up therapy are one component of a multi-disciplinary discharge planning process, led by the attending physician.  Recommendations may be updated based on patient status, additional functional criteria and insurance authorization.  Follow Up Recommendations  Skilled nursing-short term rehab (<3 hours/day)     Assistance Recommended at Discharge Frequent or constant Supervision/Assistance  Patient can return home with the following Assistance with cooking/housework;Direct supervision/assist for medications management;Help with stairs or ramp for entrance;Direct supervision/assist for financial management;Assist for transportation;A lot of help with walking and/or transfers;A lot of help with bathing/dressing/bathroom   Equipment Recommendations  None recommended by PT    Recommendations for Other Services       Precautions / Restrictions Precautions Precautions: Fall Restrictions Weight Bearing Restrictions: No Other Position/Activity Restrictions: WBAT     Mobility  Bed Mobility               General bed mobility comments: in  recliner. able to scoot to edge with supervision    Transfers Overall transfer level: Needs assistance Equipment used: Rolling walker (2 wheels) Transfers: Sit to/from Stand Sit to Stand: Mod assist           General transfer comment: verbal cues for hand placement, LE position and wt shift. assist to complete anterior-superior wt shift and to steady in standing.    Ambulation/Gait Ambulation/Gait assistance: Min assist, Mod assist Gait Distance (Feet): 15 Feet Assistive device: Rolling walker (2 wheels) Gait Pattern/deviations: Step-to pattern, Decreased step length - right, Decreased step length - left, Narrow base of support       General Gait Details: mulit-modal cues for use of UEs, sequence. assist to steady and maneuver RW; HR max 122 with activity.   Stairs             Wheelchair Mobility    Modified Rankin (Stroke Patients Only)       Balance     Sitting balance-Leahy Scale: Fair     Standing balance support: Bilateral upper extremity supported, Reliant on assistive device for balance Standing balance-Leahy Scale: Poor Standing balance comment: reliant on walker and external assist                            Cognition Arousal/Alertness: Awake/alert Behavior During Therapy: WFL for tasks assessed/performed Overall Cognitive Status: History of cognitive impairments - at baseline                     Current Attention Level: Selective Memory: Decreased short-term memory Following Commands: Follows one step commands consistently, Follows multi-step commands with increased time     Problem  Solving: Difficulty sequencing, Requires verbal cues          Exercises General Exercises - Lower Extremity Ankle Circles/Pumps: AROM, Both, 10 reps Quad Sets: AROM, Both, 10 reps Gluteal Sets: AROM, Strengthening, 10 reps, Both Heel Slides: AAROM, AROM, Right, 10 reps Hip ABduction/ADduction: AAROM, Right, 10 reps    General  Comments        Pertinent Vitals/Pain Pain Assessment Pain Assessment: Faces Pain Score: 3  Faces Pain Scale: Hurts a little bit Pain Location: R hip and thigh Pain Descriptors / Indicators: Aching, Sore, Grimacing, Guarding Pain Intervention(s): Limited activity within patient's tolerance, Monitored during session, Premedicated before session, Repositioned    Home Living                          Prior Function            PT Goals (current goals can now be found in the care plan section) Acute Rehab PT Goals Patient Stated Goal: none stated PT Goal Formulation: Patient unable to participate in goal setting Time For Goal Achievement: 12/13/21 Potential to Achieve Goals: Good Progress towards PT goals: Progressing toward goals    Frequency    Min 3X/week      PT Plan Current plan remains appropriate    Co-evaluation              AM-PAC PT "6 Clicks" Mobility   Outcome Measure  Help needed turning from your back to your side while in a flat bed without using bedrails?: A Lot Help needed moving from lying on your back to sitting on the side of a flat bed without using bedrails?: A Lot Help needed moving to and from a bed to a chair (including a wheelchair)?: A Lot Help needed standing up from a chair using your arms (e.g., wheelchair or bedside chair)?: A Lot Help needed to walk in hospital room?: A Lot Help needed climbing 3-5 steps with a railing? : Total 6 Click Score: 11    End of Session Equipment Utilized During Treatment: Gait belt Activity Tolerance: Patient tolerated treatment well Patient left: in chair;with call bell/phone within reach;with chair alarm set Nurse Communication: Mobility status PT Visit Diagnosis: Unsteadiness on feet (R26.81);Difficulty in walking, not elsewhere classified (R26.2);Muscle weakness (generalized) (M62.81);History of falling (Z91.81);Repeated falls (R29.6);Pain Pain - Right/Left: Right Pain - part of body:  Hip     Time: [id number] PT Time Calculation (min) (ACUTE ONLY): 17 min  Charges:  $Gait Training: 8-22 mins                     Baxter Flattery, PT  Acute Rehab Dept ([place]) [phone] Pager [phone]  12/02/2021    [facility] 12/02/2021, 11:19 AM

## 2021-12-02 NOTE — Progress Notes (Signed)
Patient ID: Nancy Suarez, female   DOB: 1932-04-19, 88 y.o.   MRN: [id number]   Spoke with patients nurse this morning about holding patients Eliquis due to hemoglobin. Yesterday morning her hemoglobin was 7.1 and her morning dose of eliquis was held. Hemoglobin at 1715 12/01/21 was 8.0. Patient was given evening dose of eliquis and morning hemoglobin 12/02/21 dropped to 7.3. Hold patients eliquis at this time due to decrease in hemoglobin. Restart per hospitaist and medical stability.   Patient was not started on preferable aspirin due to allergy.

## 2021-12-02 NOTE — Progress Notes (Signed)
Progress Note   Patient: Nancy Suarez [id number] DOB: 1931-11-23 DOA: 11/27/2021     5 DOS: the patient was seen and examined on 12/02/2021   Brief hospital course: 88 year old female with a history of hypertension, hyperlipidemia, chronic kidney disease stage IIIb, was at home when she had a slip and fall.  She suffered a right hip fracture.  She was admitted for further operative management.  Also noted to have elevated creatinine which appears to be above baseline.  Assessment and Plan: No notes have been filed under this hospital service. Service: Hospitalist Right hip fracture Fall -Seen by orthopedics and underwent operative management on 5/29 -Patient started apixaban for DVT prophylaxis on 5/30 -Pain controlled with scheduled tylenol -Was given trials of ultram, however, pt did not seem to tolerate, resulting in increased agitation and confusion shortly after trial of narcotic -Try to avoid narcotic if possible. Now much more alert and oriented this AM -PT/OT following, plans for SNF at d/c   Acute kidney injury on chronic kidney disease stage IIIb -Baseline creatinine appears to be around 1.4 -Admission creatinine noted to be 2.2 -No hydronephrosis on imaging -creatinine currently 1.35 -Cont to encourage PO intake   Hypertension -Blood pressure currently stable, albeit suboptimally controlled -Had been holding losartan/hydrochlorothiazide in light of acute kidney injury -Will cont PRN hydralazine   Hyperlipidemia -Continue statin   Depression -Continued on Lexapro/mirtazapine   GERD -Continue PPI   Anemia, likely related to chronic kidney disease -No evidence of bleeding -Hgb 8.2 on admission -She received 1 unit of PRBC on admission in prep for surgery -Baseline hemoglobin appears to be between 8-9 -She has been receiving IV fluids and may have some degree of hemodilution -hgb down to 6.3 on 5/30 with pt receiving another unit of prbc -Hgb thus far remains  stable -cont to follow CBC trends -Have started iron supplement   Delirium superimposed on likely mild cognitive impairment -Daughter reports that patient has been sundowning regularly at home for over a year now -She has been following with neurology -Recent MMSE score 21/30 -Noted to be increasingly confused overnight and seems worse with narcotics -Possibly related to acute injury from hip fracture and change of environment in the hospital -UA, TSH, ammonia unremarkable -Cont trial of Seroquel at night -Mentation seems much improved when not receiving narcotic, will try to avoid if possible in the future      Subjective: Remains very pleasant. States tolerating PT well today  Physical Exam: Vitals:   12/01/21 1344 12/01/21 2017 12/02/21 0443 12/02/21 1300  BP: (!) 173/60 (!) 150/61 (!) 168/66 (!) 171/55  Pulse: 98 79 76 88  Resp: 18 16 17 15   Temp:  98.4 F (36.9 C) 97.9 F (36.6 C) 98.2 F (36.8 C)  TempSrc:  Oral Oral Oral  SpO2: 100% 99% 99% 100%  Weight:      Height:       General exam: Awake, laying in bed, in nad Respiratory system: Normal respiratory effort, no wheezing Cardiovascular system: regular rate, s1, s2 Gastrointestinal system: Soft, nondistended, positive BS Central nervous system: CN2-12 grossly intact, strength intact Extremities: Perfused, no clubbing Skin: Normal skin turgor, no notable skin lesions seen Psychiatry: Mood normal // no visual hallucinations   Data Reviewed:  Cr 1.35, Hgb 7.3  Family Communication: Pt in room, family currently not at bedside. Recently updated daughter at bedside  Disposition: Status is: Inpatient Remains inpatient appropriate because: Severity of illness  Planned Discharge Destination: Home and Skilled nursing facility  Author: Marylu Lund, MD 12/02/2021 4:07 PM  For on call review [email or web address].

## 2021-12-02 NOTE — Consult Note (Signed)
[facility] CM Inpatient Consult  12/02/2021  Nancy Suarez 01-26-1932 [id number]  [facility] Care Management [facility] CM)   Patient chart has been reviewed with noted high risk score for unplanned readmissions.  Patient assessed for community [facility] Care Management follow up needs. Per review, current recommendation is for SNF.   Plan: Will continue to follow for progression and disposition plans.   Of note, [facility] Care Management services does not replace or interfere with any services that are arranged by inpatient case management or social work.    Christophe Louis, MSN, RN [facility] Liaison Toll free office [phone]

## 2021-12-03 DIAGNOSIS — N179 Acute kidney failure, unspecified: Secondary | ICD-10-CM | POA: Diagnosis not present

## 2021-12-03 DIAGNOSIS — S72141A Displaced intertrochanteric fracture of right femur, initial encounter for closed fracture: Secondary | ICD-10-CM | POA: Diagnosis not present

## 2021-12-03 LAB — COMPREHENSIVE METABOLIC PANEL
ALT: 15 U/L (ref 0–44)
AST: 24 U/L (ref 15–41)
Albumin: 2.6 g/dL — ABNORMAL LOW (ref 3.5–5.0)
Alkaline Phosphatase: 64 U/L (ref 38–126)
Anion gap: 5 (ref 5–15)
BUN: 31 mg/dL — ABNORMAL HIGH (ref 8–23)
CO2: 26 mmol/L (ref 22–32)
Calcium: 8.5 mg/dL — ABNORMAL LOW (ref 8.9–10.3)
Chloride: 109 mmol/L (ref 98–111)
Creatinine, Ser: 1.29 mg/dL — ABNORMAL HIGH (ref 0.44–1.00)
GFR, Estimated: 40 mL/min — ABNORMAL LOW (ref >60)
Glucose, Bld: 117 mg/dL — ABNORMAL HIGH (ref 70–99)
Potassium: 4.4 mmol/L (ref 3.5–5.1)
Sodium: 140 mmol/L (ref 135–145)
Total Bilirubin: 1 mg/dL (ref 0.3–1.2)
Total Protein: 5.7 g/dL — ABNORMAL LOW (ref 6.5–8.1)

## 2021-12-03 LAB — CBC
HCT: 23.7 % — ABNORMAL LOW (ref 36.0–46.0)
Hemoglobin: 7.3 g/dL — ABNORMAL LOW (ref 12.0–15.0)
MCH: 29.7 pg (ref 26.0–34.0)
MCHC: 30.8 g/dL (ref 30.0–36.0)
MCV: 96.3 fL (ref 80.0–100.0)
Platelets: 213 10*3/uL (ref 150–400)
RBC: 2.46 MIL/uL — ABNORMAL LOW (ref 3.87–5.11)
RDW: 14.5 % (ref 11.5–15.5)
WBC: 6.1 10*3/uL (ref 4.0–10.5)
nRBC: 0 % (ref 0.0–0.2)

## 2021-12-03 MED ORDER — ASPIRIN 81 MG PO TBEC
81.0000 mg | DELAYED_RELEASE_TABLET | Freq: Two times a day (BID) | ORAL | Status: DC
  Administered 2021-12-03 – 2021-12-05 (×4): 81 mg via ORAL
  Filled 2021-12-03 (×4): qty 1

## 2021-12-03 NOTE — Progress Notes (Signed)
Physical Therapy Treatment Patient Details Name: Nancy Suarez MRN: [id number] DOB: March 26, 1932 Today's Date: 12/03/2021   History of Present Illness 88 year old female admitted from home after a  fall.  She suffered a right hip fracture. orthopedics consulted. pt is  s/p IM nail per Dr Lyla Glassing on 11/28/21.  Pt with post op ABLA with Hgb 6.3. transfused one unit 11/29/21. PMH: scoliosis,  hypertension, hyperlipidemia, chronic kidney disease stage IIIb, L TKA, L periprosthetic femur fx with ORIF 06/09/17.    PT Comments    Pt tolerates BLE strengthening exercises, verbal cues for motor control and avoiding breath holding with exercises. Pt performs STS reps from recliner, cues for hand placement and quad/glute activation to assist in powering up to stand; ultimately requiring mod A to power up and shift weight anterior when rising. Pt performs static marching with RW, heavy use of BUE when lifting LLE due to decreased RLE weight-bearing Pt too fearful to step away from recliner with +1 assist in room, tolerates 3 bouts of standing marching, educated on avoiding breath holding, UE assist on RW and RLE weight-bearing as tolerated. Continue to progress as able.  Recommendations for follow up therapy are one component of a multi-disciplinary discharge planning process, led by the attending physician.  Recommendations may be updated based on patient status, additional functional criteria and insurance authorization.  Follow Up Recommendations  Skilled nursing-short term rehab (<3 hours/day)     Assistance Recommended at Discharge Frequent or constant Supervision/Assistance  Patient can return home with the following Assistance with cooking/housework;Direct supervision/assist for medications management;Help with stairs or ramp for entrance;Direct supervision/assist for financial management;Assist for transportation;A lot of help with walking and/or transfers;A lot of help with bathing/dressing/bathroom    Equipment Recommendations  None recommended by PT    Recommendations for Other Services       Precautions / Restrictions Precautions Precautions: Fall Restrictions Weight Bearing Restrictions: No RLE Weight Bearing: Weight bearing as tolerated     Mobility  Bed Mobility  General bed mobility comments: in recliner    Transfers Overall transfer level: Needs assistance Equipment used: Rolling walker (2 wheels) Transfers: Sit to/from Stand Sit to Stand: Mod assist  General transfer comment: VC for hand placement and flat foot posture, educated to shift weight anterior, mod A to power up, hip extension last, increased LLE weight-bearing wtih decreased RLE weight-bearing in static standing    Ambulation/Gait Ambulation/Gait assistance: Min assist  Assistive device: Rolling walker (2 wheels)  General Gait Details: pt performs standing marching, 10 reps x3 sets with seated rest break between, heavy use of BUE on RW to lift LLE due to decreased RLE weight-bearing tolerance, VC to avoid breath holding and for upright posture; pt too fearful to step away from recliner with +1 assist in room   Stairs             Wheelchair Mobility    Modified Rankin (Stroke Patients Only)       Balance Overall balance assessment: Needs assistance, History of Falls  Standing balance support: Bilateral upper extremity supported, Reliant on assistive device for balance, During functional activity Standing balance-Leahy Scale: Poor       Cognition Arousal/Alertness: Awake/alert Behavior During Therapy: WFL for tasks assessed/performed Overall Cognitive Status: History of cognitive impairments - at baseline  Memory: Decreased short-term memory Following Commands: Follows one step commands consistently, Follows multi-step commands with increased time  Problem Solving: Requires verbal cues General Comments: pt pleasant, alert and oriented, requires verbal cues and increased  time with  mobility        Exercises General Exercises - Lower Extremity Long Arc Quad: Seated, AROM, Strengthening, Both, 10 reps Hip Flexion/Marching: Seated, AROM, Strengthening, Both, 10 reps Toe Raises: Seated, AROM, Strengthening, Both, 10 reps Heel Raises: Seated, AROM, Strengthening, Both, 10 reps    General Comments        Pertinent Vitals/Pain Pain Assessment Pain Assessment: Faces Faces Pain Scale: Hurts little more Pain Location: R hip and thigh Pain Descriptors / Indicators: Grimacing, Guarding Pain Intervention(s): Limited activity within patient's tolerance, Monitored during session, Repositioned    Home Living                          Prior Function            PT Goals (current goals can now be found in the care plan section) Acute Rehab PT Goals Patient Stated Goal: none stated PT Goal Formulation: Patient unable to participate in goal setting Time For Goal Achievement: 12/13/21 Potential to Achieve Goals: Good Progress towards PT goals: Progressing toward goals    Frequency    Min 3X/week      PT Plan Current plan remains appropriate    Co-evaluation              AM-PAC PT "6 Clicks" Mobility   Outcome Measure  Help needed turning from your back to your side while in a flat bed without using bedrails?: A Lot Help needed moving from lying on your back to sitting on the side of a flat bed without using bedrails?: A Lot Help needed moving to and from a bed to a chair (including a wheelchair)?: A Lot Help needed standing up from a chair using your arms (e.g., wheelchair or bedside chair)?: A Lot Help needed to walk in hospital room?: A Lot Help needed climbing 3-5 steps with a railing? : Total 6 Click Score: 11    End of Session Equipment Utilized During Treatment: Gait belt Activity Tolerance: Patient tolerated treatment well Patient left: in chair;with call bell/phone within reach;with chair alarm set Nurse Communication: Mobility  status PT Visit Diagnosis: Unsteadiness on feet (R26.81);Difficulty in walking, not elsewhere classified (R26.2);Muscle weakness (generalized) (M62.81);History of falling (Z91.81);Repeated falls (R29.6);Pain Pain - Right/Left: Right Pain - part of body: Hip     Time: [id number] PT Time Calculation (min) (ACUTE ONLY): 28 min  Charges:  $Therapeutic Exercise: 8-22 mins $Therapeutic Activity: 8-22 mins                      Tori Peta Peachey PT, DPT 12/03/21, 12:00 PM

## 2021-12-03 NOTE — Progress Notes (Signed)
Progress Note   Patient: Nancy Suarez [id number] DOB: 03/07/1932 DOA: 11/27/2021     6 DOS: the patient was seen and examined on 12/03/2021   Brief hospital course: 88 year old female with a history of hypertension, hyperlipidemia, chronic kidney disease stage IIIb, was at home when she had a slip and fall.  She suffered a right hip fracture.  She was admitted for further operative management.  Also noted to have elevated creatinine which appears to be above baseline.  Assessment and Plan: No notes have been filed under this hospital service. Service: Hospitalist Right hip fracture Fall -Seen by orthopedics and underwent operative management on 5/29 -Patient started apixaban for DVT prophylaxis on 5/30 -Pain controlled with scheduled tylenol -Was given trials of ultram, however, pt did not seem to tolerate, resulting in increased agitation and confusion shortly after trial of narcotic -Try to avoid narcotic if possible. Remains oriented, pain relatively controlled on tylenol -PT/OT following, plans for SNF at d/c   Acute kidney injury on chronic kidney disease stage IIIb -Baseline creatinine appears to be around 1.4 -Admission creatinine noted to be 2.2 -No hydronephrosis on imaging -creatinine currently 1.29 -Cont to encourage PO intake   Hypertension -Blood pressure currently stable, albeit suboptimally controlled -Had been holding losartan/hydrochlorothiazide in light of acute kidney injury -Will cont PRN hydralazine   Hyperlipidemia -Continue statin   Depression -Continued on Lexapro/mirtazapine   GERD -Continue PPI   Anemia, likely related to chronic kidney disease -No evidence of bleeding -Hgb 8.2 on admission -She received 1 unit of PRBC on admission in prep for surgery -Baseline hemoglobin appears to be between 8-9 -She has been receiving IV fluids and may have some degree of hemodilution -hgb down to 6.3 on 5/30 with pt receiving another unit of prbc -Hgb  thus far remains stable currently 7.3 -cont to follow CBC trends -Have started iron supplement   Delirium superimposed on likely mild cognitive impairment -Daughter reports that patient has been sundowning regularly at home for over a year now -She has been following with neurology -Recent MMSE score 21/30 -Noted to be increasingly confused overnight and seems worse with narcotics -Possibly related to acute injury from hip fracture and change of environment in the hospital -UA, TSH, ammonia unremarkable -Cont trial of Seroquel at night -Mentation seems much improved when not receiving narcotic, will try to avoid if possible in the future      Subjective: Very pleasant, reports tolerating therapy well. Pain better controlled on tylenol  Physical Exam: Vitals:   12/02/21 2051 12/03/21 0547 12/03/21 0558 12/03/21 1312  BP: (!) 153/64 (!) 171/66 (!) 160/66 (!) 155/78  Pulse: 88 86 83 89  Resp: 19 18  18   Temp: 98.7 F (37.1 C) 98.3 F (36.8 C)  98.4 F (36.9 C)  TempSrc: Oral   Oral  SpO2: 98% 100%  100%  Weight:      Height:       General exam: Conversant, in no acute distress Respiratory system: normal chest rise, clear, no audible wheezing Cardiovascular system: regular rhythm, s1-s2 Gastrointestinal system: Nondistended, nontender, pos BS Central nervous system: No seizures, no tremors Extremities: No cyanosis, no joint deformities Skin: No rashes, no pallor Psychiatry: Affect normal // no auditory hallucinations   Data Reviewed:  Cr 1.29, Hgb 7.3  Family Communication: Pt in room, family currently not at bedside.   Disposition: Status is: Inpatient Remains inpatient appropriate because: Severity of illness  Planned Discharge Destination: Home and Skilled nursing facility  Author: Marylu Lund, MD 12/03/2021 2:55 PM  For on call review [email or web address].

## 2021-12-04 DIAGNOSIS — N179 Acute kidney failure, unspecified: Secondary | ICD-10-CM | POA: Diagnosis not present

## 2021-12-04 DIAGNOSIS — S72141A Displaced intertrochanteric fracture of right femur, initial encounter for closed fracture: Secondary | ICD-10-CM | POA: Diagnosis not present

## 2021-12-04 LAB — CBC
HCT: 24.5 % — ABNORMAL LOW (ref 36.0–46.0)
Hemoglobin: 7.3 g/dL — ABNORMAL LOW (ref 12.0–15.0)
MCH: 29 pg (ref 26.0–34.0)
MCHC: 29.8 g/dL — ABNORMAL LOW (ref 30.0–36.0)
MCV: 97.2 fL (ref 80.0–100.0)
Platelets: 241 10*3/uL (ref 150–400)
RBC: 2.52 MIL/uL — ABNORMAL LOW (ref 3.87–5.11)
RDW: 14.6 % (ref 11.5–15.5)
WBC: 5.2 10*3/uL (ref 4.0–10.5)
nRBC: 0 % (ref 0.0–0.2)

## 2021-12-04 LAB — COMPREHENSIVE METABOLIC PANEL
ALT: 14 U/L (ref 0–44)
AST: 19 U/L (ref 15–41)
Albumin: 2.6 g/dL — ABNORMAL LOW (ref 3.5–5.0)
Alkaline Phosphatase: 61 U/L (ref 38–126)
Anion gap: 5 (ref 5–15)
BUN: 27 mg/dL — ABNORMAL HIGH (ref 8–23)
CO2: 27 mmol/L (ref 22–32)
Calcium: 9 mg/dL (ref 8.9–10.3)
Chloride: 110 mmol/L (ref 98–111)
Creatinine, Ser: 1.27 mg/dL — ABNORMAL HIGH (ref 0.44–1.00)
GFR, Estimated: 41 mL/min — ABNORMAL LOW (ref >60)
Glucose, Bld: 95 mg/dL (ref 70–99)
Potassium: 4.4 mmol/L (ref 3.5–5.1)
Sodium: 142 mmol/L (ref 135–145)
Total Bilirubin: 1 mg/dL (ref 0.3–1.2)
Total Protein: 5.5 g/dL — ABNORMAL LOW (ref 6.5–8.1)

## 2021-12-04 NOTE — Progress Notes (Signed)
Progress Note   Patient: Nancy Suarez [id number]:[id number] DOB: 01-14-1932 DOA: 11/27/2021     7 DOS: the patient was seen and examined on 12/04/2021   Brief hospital course: 88 year old female with a history of hypertension, hyperlipidemia, chronic kidney disease stage IIIb, was at home when she had a slip and fall.  She suffered a right hip fracture.  She was admitted for further operative management.  Also noted to have elevated creatinine which appears to be above baseline.  Assessment and Plan: No notes have been filed under this hospital service. Service: Hospitalist Right hip fracture Fall -Seen by orthopedics and underwent operative management on 5/29 -Patient started apixaban for DVT prophylaxis on 5/30 -Pain controlled with scheduled tylenol -Was given trials of ultram, however, pt did not seem to tolerate, resulting in increased agitation and confusion shortly after trial of narcotic -Try to avoid narcotic if possible. Remains oriented, pain relatively controlled on tylenol -PT/OT following, plans for SNF at d/c   Acute kidney injury on chronic kidney disease stage IIIb -Baseline creatinine appears to be around 1.4 -Admission creatinine noted to be 2.2 -No hydronephrosis on imaging -creatinine currently 1.27 -Cont to encourage PO intake   Hypertension -Blood pressure currently stable, albeit suboptimally controlled -Had been holding losartan/hydrochlorothiazide in light of acute kidney injury -Will cont PRN hydralazine   Hyperlipidemia -Continue statin   Depression -Continued on Lexapro/mirtazapine   GERD -Continue PPI   Anemia, likely related to chronic kidney disease -No evidence of bleeding -Hgb 8.2 on admission -She received 1 unit of PRBC on admission in prep for surgery -Baseline hemoglobin appears to be between 8-9 -She has been receiving IV fluids and may have some degree of hemodilution -hgb down to 6.3 on 5/30 with pt receiving another unit of prbc -Hgb  thus far remains stable currently 7.3 -Have started iron supplement, tolerating   Delirium superimposed on likely mild cognitive impairment -Daughter reports that patient has been sundowning regularly at home for over a year now -She has been following with neurology -Recent MMSE score 21/30 -Noted to be increasingly confused overnight and seems worse with narcotics -Possibly related to acute injury from hip fracture and change of environment in the hospital -UA, TSH, ammonia unremarkable -On trial of Seroquel at night -Mentation seems much improved when not receiving narcotic, continue to avoid if possible in the future      Subjective: Without complaints. Tolerating therapy. Eager to start rehab  Physical Exam: Vitals:   12/03/21 1312 12/03/21 2036 12/04/21 0625 12/04/21 1345  BP: (!) 155/78 (!) 142/68 138/72 (!) 136/52  Pulse: 89 87 79 89  Resp: 18 19 19 14   Temp: 98.4 F (36.9 C) 98.2 F (36.8 C) 98.1 F (36.7 C) 98 F (36.7 C)  TempSrc: Oral Oral Oral Oral  SpO2: 100% 100% 100% 100%  Weight:      Height:       General exam: Awake, laying in bed, in nad Respiratory system: Normal respiratory effort, no wheezing Cardiovascular system: regular rate, s1, s2 Gastrointestinal system: Soft, nondistended, positive BS Central nervous system: CN2-12 grossly intact, strength intact Extremities: Perfused, no clubbing Skin: Normal skin turgor, no notable skin lesions seen Psychiatry: Mood normal // no visual hallucinations   Data Reviewed:  Cr 1.27, Hgb 7.3  Family Communication: Pt in room, family currently not at bedside.   Disposition: Status is: Inpatient Remains inpatient appropriate because: Severity of illness  Planned Discharge Destination: Home and Skilled nursing facility    Author: Rickey Barbara,  MD 12/04/2021 2:06 PM  For on call review [email or web address].

## 2021-12-04 NOTE — TOC Progression Note (Addendum)
Transition of Care [facility]) - Progression Note    Patient Details  Name: Nancy Suarez MRN: [id number] Date of Birth: 03/12/1932  [facility]) CM/SW Contact  Darleene Cleaver, [place] Phone Number: 12/04/2021, 12:42 PM  Clinical Narrative:     Patient has been approved for SNF placement at [facility] with a start date of 12/05/2021, next review 12/07/2021, reference number is [id number].  CSW updated admissions worker at [employer], bedside nurse, attending physician, and patient's daughter Byrd Hesselbach [phone].  CSW explained to patient's daughter that she has been approved initially for a few days, the SNF will send updates to insurance company to determine how she is progressing.  Patient's daughter did not express any other questions.   Expected Discharge Plan: Skilled Nursing Facility Barriers to Discharge: Continued Medical Work up  Expected Discharge Plan and Services Expected Discharge Plan: Skilled Nursing Facility   Discharge Planning Services: CM Consult Post Acute Care Choice: Skilled Nursing Facility Living arrangements for the past 2 months: Single Family Home                                       Social Determinants of Health (SDOH) Interventions    Readmission Risk Interventions    12/01/2021    1:14 PM  Readmission Risk Prevention Plan  Transportation Screening Complete  PCP or Specialist Appt within 3-5 Days Complete  HRI or Home Care Consult Complete  Social Work Consult for Recovery Care Planning/Counseling Complete  Palliative Care Screening Complete  Medication Review [employer]) Complete

## 2021-12-05 DIAGNOSIS — M8580 Other specified disorders of bone density and structure, unspecified site: Secondary | ICD-10-CM | POA: Diagnosis not present

## 2021-12-05 DIAGNOSIS — M6281 Muscle weakness (generalized): Secondary | ICD-10-CM | POA: Diagnosis not present

## 2021-12-05 DIAGNOSIS — D649 Anemia, unspecified: Secondary | ICD-10-CM | POA: Diagnosis not present

## 2021-12-05 DIAGNOSIS — R2681 Unsteadiness on feet: Secondary | ICD-10-CM | POA: Diagnosis not present

## 2021-12-05 DIAGNOSIS — S72141D Displaced intertrochanteric fracture of right femur, subsequent encounter for closed fracture with routine healing: Secondary | ICD-10-CM | POA: Diagnosis not present

## 2021-12-05 DIAGNOSIS — K219 Gastro-esophageal reflux disease without esophagitis: Secondary | ICD-10-CM | POA: Diagnosis not present

## 2021-12-05 DIAGNOSIS — M479 Spondylosis, unspecified: Secondary | ICD-10-CM | POA: Diagnosis not present

## 2021-12-05 DIAGNOSIS — D631 Anemia in chronic kidney disease: Secondary | ICD-10-CM | POA: Diagnosis not present

## 2021-12-05 DIAGNOSIS — N179 Acute kidney failure, unspecified: Secondary | ICD-10-CM | POA: Diagnosis not present

## 2021-12-05 DIAGNOSIS — E049 Nontoxic goiter, unspecified: Secondary | ICD-10-CM | POA: Diagnosis not present

## 2021-12-05 DIAGNOSIS — I129 Hypertensive chronic kidney disease with stage 1 through stage 4 chronic kidney disease, or unspecified chronic kidney disease: Secondary | ICD-10-CM | POA: Diagnosis not present

## 2021-12-05 DIAGNOSIS — R41841 Cognitive communication deficit: Secondary | ICD-10-CM | POA: Diagnosis not present

## 2021-12-05 DIAGNOSIS — S72141A Displaced intertrochanteric fracture of right femur, initial encounter for closed fracture: Secondary | ICD-10-CM | POA: Diagnosis not present

## 2021-12-05 DIAGNOSIS — M419 Scoliosis, unspecified: Secondary | ICD-10-CM | POA: Diagnosis not present

## 2021-12-05 DIAGNOSIS — E785 Hyperlipidemia, unspecified: Secondary | ICD-10-CM | POA: Diagnosis not present

## 2021-12-05 DIAGNOSIS — S72001D Fracture of unspecified part of neck of right femur, subsequent encounter for closed fracture with routine healing: Secondary | ICD-10-CM | POA: Diagnosis not present

## 2021-12-05 DIAGNOSIS — Z9181 History of falling: Secondary | ICD-10-CM | POA: Diagnosis not present

## 2021-12-05 DIAGNOSIS — N1832 Chronic kidney disease, stage 3b: Secondary | ICD-10-CM | POA: Diagnosis not present

## 2021-12-05 MED ORDER — AMLODIPINE BESYLATE 2.5 MG PO TABS: 2.5000 mg | ORAL_TABLET | Freq: Every day | ORAL | 0 refills | Status: DC

## 2021-12-05 MED ORDER — SENNA 8.6 MG PO TABS: 1.0000 | ORAL_TABLET | Freq: Two times a day (BID) | ORAL | 0 refills | Status: DC

## 2021-12-05 MED ORDER — DOCUSATE SODIUM 100 MG PO CAPS: 100.0000 mg | ORAL_CAPSULE | Freq: Two times a day (BID) | ORAL | 0 refills | Status: AC

## 2021-12-05 MED ORDER — QUETIAPINE FUMARATE 25 MG PO TABS: 25.0000 mg | ORAL_TABLET | Freq: Every day | ORAL | 0 refills | Status: DC

## 2021-12-05 MED ORDER — POLYETHYLENE GLYCOL 3350 17 G PO PACK: 17.0000 g | PACK | Freq: Every day | ORAL | 0 refills | Status: DC

## 2021-12-05 MED ORDER — ACETAMINOPHEN 500 MG PO TABS: 1000.0000 mg | ORAL_TABLET | Freq: Two times a day (BID) | ORAL | 0 refills | Status: AC

## 2021-12-05 MED ORDER — FERROUS SULFATE 325 (65 FE) MG PO TABS: 325.0000 mg | ORAL_TABLET | Freq: Every day | ORAL | 0 refills | Status: DC

## 2021-12-05 MED ORDER — ACETAMINOPHEN 325 MG PO TABS: 325.0000 mg | ORAL_TABLET | Freq: Four times a day (QID) | ORAL | 0 refills | Status: DC | PRN

## 2021-12-05 NOTE — Discharge Summary (Signed)
Physician Discharge Summary   Patient: Nancy Suarez MRN: [id number] DOB: [birth year]  Admit date:     11/27/2021  Discharge date: 12/05/21  Discharge Physician: Marylu Lund   PCP: Vincente Liberty, MD   Recommendations at discharge:    Follow up with PCP in 1-2 weeks Recommend repeat CBC in 1-2 weeks Please ensure regular bowel movements, recommend additional cathartics as needed since patient will be prone to constipation  Discharge Diagnoses: Principal Problem:   Hip fracture ([place]) Active Problems:   GERD (gastroesophageal reflux disease)   Essential hypertension   HLD (hyperlipidemia)   Depression   Acute renal failure superimposed on stage 3b chronic kidney disease ([place])   Fall   Normocytic anemia  Resolved Problems:   * No resolved hospital problems. *  Hospital Course: 88 year old female with a history of hypertension, hyperlipidemia, chronic kidney disease stage IIIb, was at home when she had a slip and fall.  She suffered a right hip fracture.  She was admitted for further operative management.  Also noted to have elevated creatinine which appears to be above baseline.  Assessment and Plan: Right hip fracture Fall -Seen by orthopedics and underwent operative management on 5/29 -Patient started apixaban for DVT prophylaxis on 5/30 -Pain controlled with scheduled tylenol -Was given trials of ultram, however, pt did not seem to tolerate, resulting in increased agitation and confusion shortly after trial of narcotic -Try to avoid narcotic if possible. Remains oriented, pain relatively controlled on tylenol -PT/OT following, plans for SNF at d/c   Acute kidney injury on chronic kidney disease stage IIIb -Baseline creatinine appears to be around 1.4 -Admission creatinine noted to be 2.2 -No hydronephrosis on imaging -creatinine currently 1.27 -Would cont to hold home losartan-HCTZ given renal injury. For bp, have prescribed low dose norasc per below    Hypertension -Blood pressure currently stable, albeit suboptimally controlled -Had been holding losartan/hydrochlorothiazide in light of acute kidney injury -Prescribed low dose norvasc on d/c. Would titrate bp meds as needed   Hyperlipidemia -Continue statin   Depression -Continued on Lexapro/mirtazapine   GERD -Continue PPI   Anemia, likely related to chronic kidney disease -No evidence of bleeding -Hgb 8.2 on admission -She received 1 unit of PRBC on admission in prep for surgery -Baseline hemoglobin appears to be between 8-9 -She has been receiving IV fluids and may have some degree of hemodilution -hgb down to 6.3 on 5/30 with pt receiving another unit of prbc -Hgb thus far remains stable currently 7.3 -Have started iron supplement, tolerating. Please ensure regular bowel movements as pt will be prone to constipation   Delirium superimposed on likely mild cognitive impairment -Daughter reports that patient has been sundowning regularly at home for over a year now -She has been following with neurology -Recent MMSE score 21/30 -Noted to be increasingly confused when given narcotics -Possibly related to acute injury from hip fracture and change of environment in the hospital -UA, TSH, ammonia unremarkable -On trial of Seroquel at night -Mentation seems much improved when not receiving narcotic, continue to avoid if possible in the future          Consultants: Orthopedic Surgery Procedures performed:intramedullary fixation of R femur  Disposition: Skilled nursing facility Diet recommendation:  Regular diet DISCHARGE MEDICATION: Allergies as of 12/05/2021       Reactions   Ambien [zolpidem] Other (See Comments)   Disoriented, aggressive   Aspirin    Upsets stomach if 325mg    Codeine Nausea And Vomiting   Namenda [memantine]  Agitation, aggression   Percocet [oxycodone-acetaminophen] Other (See Comments)   Disoriented, aggressive   Sulfa Antibiotics Nausea  Only        Medication List     STOP taking these medications    cetirizine 10 MG tablet Commonly known as: ZYRTEC   dexamethasone 4 MG tablet Commonly known as: DECADRON   diphenhydramine-acetaminophen 25-500 MG Tabs tablet Commonly known as: TYLENOL PM   losartan-hydrochlorothiazide 100-25 MG tablet Commonly known as: HYZAAR       TAKE these medications    acetaminophen 500 MG tablet Commonly known as: TYLENOL Take 2 tablets (1,000 mg total) by mouth 2 (two) times daily. What changed: when to take this   acetaminophen 325 MG tablet Commonly known as: TYLENOL Take 1 tablet (325 mg total) by mouth every 6 (six) hours as needed for mild pain. What changed: You were already taking a medication with the same name, and this prescription was added. Make sure you understand how and when to take each.   ALLEGRA ALLERGY PO Take 1 tablet by mouth daily.   apixaban 2.5 MG Tabs tablet Commonly known as: Eliquis Take 1 tablet (2.5 mg total) by mouth 2 (two) times daily.   atorvastatin 20 MG tablet Commonly known as: LIPITOR Take 20 mg by mouth daily.   carboxymethylcellulose 0.5 % Soln Commonly known as: REFRESH PLUS Place 1 drop into both eyes in the morning, at noon, and at bedtime.   docusate sodium 100 MG capsule Commonly known as: COLACE Take 1 capsule (100 mg total) by mouth 2 (two) times daily.   escitalopram 10 MG tablet Commonly known as: LEXAPRO Take 10 mg by mouth daily.   famotidine 20 MG tablet Commonly known as: PEPCID Take 20 mg by mouth 2 (two) times daily.   ferrous sulfate 325 (65 FE) MG tablet Take 1 tablet (325 mg total) by mouth daily with breakfast. Start taking on: December 06, 2021   Melatonin 10 MG Tabs Take 10-20 mg by mouth at bedtime.   mirtazapine 15 MG tablet Commonly known as: REMERON Take 15 mg by mouth at bedtime.   multivitamin with minerals Tabs tablet Take 1 tablet by mouth daily.   polyethylene glycol 17 g  packet Commonly known as: MIRALAX / GLYCOLAX Take 17 g by mouth daily. Start taking on: December 06, 2021   QUEtiapine 25 MG tablet Commonly known as: SEROQUEL Take 1 tablet (25 mg total) by mouth at bedtime.   senna 8.6 MG Tabs tablet Commonly known as: SENOKOT Take 1 tablet (8.6 mg total) by mouth 2 (two) times daily.        Contact information for follow-up providers     Swinteck, Aaron Edelman, MD. Schedule an appointment as soon as possible for a visit in 2 week(s).   Specialty: Orthopedic Surgery Why: For suture removal, For wound re-check Contact information: [address] [place] [address] [phone]              Contact information for after-discharge care     Destination     [facility] Preferred SNF .   Service: Skilled Nursing Contact information: [address] [place] [phone]                    Discharge Exam: Danley Danker Weights   11/27/21 1627  Weight: 65.1 kg   General exam: Awake, laying in bed, in nad Respiratory system: Normal respiratory effort, no wheezing Cardiovascular system: regular rate,  s1, s2 Gastrointestinal system: Soft, nondistended, positive BS Central nervous system: CN2-12 grossly intact, strength intact Extremities: Perfused, no clubbing Skin: Normal skin turgor, no notable skin lesions seen Psychiatry: Mood normal // no visual hallucinations   Condition at discharge: fair  The results of significant diagnostics from this hospitalization (including imaging, microbiology, ancillary and laboratory) are listed below for reference.   Imaging Studies: US RENAL  Result Date: 11/27/2021 CLINICAL DATA:  Acute kidney injury. EXAM: RENAL / URINARY TRACT ULTRASOUND COMPLETE COMPARISON:  None Available. FINDINGS: Right Kidney: Renal measurements: 5.2 x 5.1 x 5.6 cm = volume: 137 mL. Limited visualization. Grossly normal parenchymal echogenicity. No hydronephrosis.  Upper pole cyst measuring 2.8 cm. No other defined masses. Left Kidney: Renal measurements: 7.7 x 4.0 x 4.2 cm = volume: 66.7 mL. Normal parenchymal echogenicity. Cyst arises from the upper pole, 2.9 cm. No other masses. No hydronephrosis. Bladder: Appears normal for degree of bladder distention. Other: None. IMPRESSION: 1. Limited study due to patient is breathing, inability to move and bowel gas. Allowing for this, no acute findings. No hydronephrosis. 2. Bilateral small kidneys, left smaller than the right, consistent with medical renal disease. 3. Single bilateral renal cysts. Electronically Signed   By: Lajean Manes M.D.   On: 11/27/2021 16:10   Pelvis Portable  Result Date: 11/28/2021 CLINICAL DATA:  Status post right intramedullary nail. EXAM: PORTABLE PELVIS 1-2 VIEWS COMPARISON:  None Available. FINDINGS: Status post right hip intramedullary nail with dynamic screw and a single distal interlocking screw. Subcutaneous emphysema and surgical clips along the lateral aspect of the thigh. Partially imaged intramedullary nail in the left femur. IMPRESSION: Status post right femoral intramedullary nail with dynamic screw. Electronically Signed   By: Keane Police D.O.   On: 11/28/2021 09:44   DG CHEST PORT 1 VIEW  Result Date: 11/27/2021 CLINICAL DATA:  Cough for 1 week.  Right hip fracture. EXAM: PORTABLE CHEST 1 VIEW COMPARISON:  04/13/2021. FINDINGS: Cardiac silhouette is normal in size. No mediastinal or hilar masses. Elevated right hemidiaphragm. Mild linear opacity above the right hemidiaphragm consistent with atelectasis. Subtle focal opacity in the right upper lung, which could reflect a nodule. Lungs otherwise clear. No pleural effusion or pneumothorax. Skeletal structures are demineralized, grossly intact. IMPRESSION: 1. No acute cardiopulmonary disease. 2. Possible right upper lobe nodule. Recommend non urgent follow-up chest CT without contrast for further assessment. Electronically Signed    By: Lajean Manes M.D.   On: 11/27/2021 14:50   DG Knee Complete 4 Views Right  Result Date: 11/27/2021 CLINICAL DATA:  [place] in the bathroom today.  Right knee pain. EXAM: RIGHT KNEE - COMPLETE 4+ VIEW COMPARISON:  None Available. FINDINGS: No fracture or bone lesion. Skeletal structures are diffusely demineralized. Marked narrowing of the medial joint space compartment, milder narrowing of the lateral and patellofemoral joint space compartments. Medial compartment subchondral sclerosis. Marginal osteophytes from all 3 compartments. No joint effusion. Surrounding soft tissues are unremarkable. IMPRESSION: 1. No fracture or acute finding. 2. Advanced osteoarthritis predominantly involving the medial compartment. Electronically Signed   By: Lajean Manes M.D.   On: 11/27/2021 12:08   DG C-Arm 1-60 Min-No Report  Result Date: 11/28/2021 Fluoroscopy was utilized by the requesting physician.  No radiographic interpretation.   DG HIP PORT UNILAT WITH PELVIS 1V RIGHT  Result Date: 11/28/2021 CLINICAL DATA:  Right hip fluoroscopic image for intramedullary nail placement EXAM: DG HIP (WITH OR WITHOUT PELVIS) 1V PORT RIGHT COMPARISON:  Radiographs dated Nov 27, 2021 FINDINGS:  Intraoperative fluoroscopic images for right hip intramedullary nail placement for intertrochanteric fracture. Total fluoroscopic time was 1 minute. Total fluoroscopic dose was 7.99 mGy. IMPRESSION: Intraoperative utilization of fluoroscopy for intramedullary nail placement for ORIF of right intertrochanteric fracture. Electronically Signed   By: Keane Police D.O.   On: 11/28/2021 09:46   DG Hip Unilat With Pelvis 2-3 Views Right  Result Date: 11/27/2021 CLINICAL DATA:  Right hip pain after fall in bathroom today. EXAM: DG HIP (WITH OR WITHOUT PELVIS) 2-3V RIGHT COMPARISON:  None Available. FINDINGS: There is an acute and comminuted intertrochanteric fracture involving the proximal right femur. There is been lateral and superior  displacement of the distal fracture fragments. No sign of dislocation. Previous ORIF of the left femur. IMPRESSION: Acute and comminuted intertrochanteric fracture of the proximal right femur. Electronically Signed   By: Kerby Moors M.D.   On: 11/27/2021 12:07    Microbiology: Results for orders placed or performed during the hospital encounter of 11/27/21  MRSA Next Gen by PCR, Nasal     Status: None   Collection Time: 11/28/21  4:52 AM   Specimen: Nasal Mucosa; Nasal Swab  Result Value Ref Range Status   MRSA by PCR Next Gen NOT DETECTED NOT DETECTED Final    Comment: (NOTE) The GeneXpert MRSA Assay (FDA approved for NASAL specimens only), is one component of a comprehensive MRSA colonization surveillance program. It is not intended to diagnose MRSA infection nor to guide or monitor treatment for MRSA infections. Test performance is not FDA approved in patients less than 67 years old. Performed at [facility], [place] [address], [place], [place] [address]     Labs: CBC: Recent Labs  Lab 11/30/21 [id number] 12/01/21 0459 12/01/21 1715 12/02/21 0429 12/03/21 0520 12/04/21 0515  WBC 7.8 5.7  --  5.8 6.1 5.2  HGB 7.7* 7.1* 8.0* 7.3* 7.3* 7.3*  HCT 24.3* 22.9* 25.8* 24.0* 23.7* 24.5*  MCV 93.1 95.8  --  94.9 96.3 97.2  PLT 121* 144*  --  170 213 [id number]   Basic Metabolic Panel: Recent Labs  Lab 11/30/21 0812 12/01/21 0459 12/02/21 0429 12/03/21 0520 12/04/21 0515  NA 140 141 139 140 142  K 4.4 4.5 4.3 4.4 4.4  CL 110 112* 108 109 110  CO2 23 23 25 26 27   GLUCOSE 106* 96 110* 117* 95  BUN 28* 26* 29* 31* 27*  CREATININE 1.51* 1.36* 1.35* 1.29* 1.27*  CALCIUM 8.6* 8.3* 8.5* 8.5* 9.0   Liver Function Tests: Recent Labs  Lab 11/29/21 0437 12/02/21 0429 12/03/21 0520 12/04/21 0515  AST 17 17 24 19   ALT 11 10 15 14   ALKPHOS 52 53 64 61  BILITOT 0.7 1.0 1.0 1.0  PROT 5.2* 5.4* 5.7* 5.5*  ALBUMIN 2.6* 2.6* 2.6* 2.6*   CBG: No results for input(s):  GLUCAP in the last 168 hours.  Discharge time spent: less than 30 minutes.  Signed: Marylu Lund, MD [facility] 12/05/2021

## 2021-12-05 NOTE — Care Management Important Message (Signed)
Important Message  Patient Details IM Letter given to the Patient. Name: Nancy Suarez MRN: [id number] Date of Birth: 1932/06/19   Medicare Important Message Given:  Yes     Caren Macadam 12/05/2021, 1:18 PM

## 2021-12-05 NOTE — Progress Notes (Signed)
Physical Therapy Treatment Patient Details Name: Nancy Suarez MRN: [id number] DOB: 1931-07-11 Today's Date: 12/05/2021   History of Present Illness 88 year old female admitted from home after a  fall.  She suffered a right hip fracture. orthopedics consulted. pt is  s/p IM nail per Dr Lyla Glassing on 11/28/21.  Pt with post op ABLA with Hgb 6.3. transfused one unit 11/29/21. PMH: scoliosis,  hypertension, hyperlipidemia, chronic kidney disease stage IIIb, L TKA, L periprosthetic femur fx with ORIF 06/09/17.    PT Comments    Pt very agreeable to mobilize and able to ambulate today with recliner following.  Pt able to tolerate 60 feet total with seated rest breaks as needed.  Pt anticipates d/c to SNF soon.    Recommendations for follow up therapy are one component of a multi-disciplinary discharge planning process, led by the attending physician.  Recommendations may be updated based on patient status, additional functional criteria and insurance authorization.  Follow Up Recommendations  Skilled nursing-short term rehab (<3 hours/day)     Assistance Recommended at Discharge    Patient can return home with the following Assistance with cooking/housework;Direct supervision/assist for medications management;Help with stairs or ramp for entrance;Direct supervision/assist for financial management;Assist for transportation;A lot of help with walking and/or transfers;A lot of help with bathing/dressing/bathroom   Equipment Recommendations  None recommended by PT    Recommendations for Other Services       Precautions / Restrictions Precautions Precautions: Fall Restrictions Weight Bearing Restrictions: No RLE Weight Bearing: Weight bearing as tolerated     Mobility  Bed Mobility Overal bed mobility: Needs Assistance Bed Mobility: Supine to Sit     Supine to sit: Mod assist, +2 for safety/equipment     General bed mobility comments: verbal cues for self assist, required assist for R LE  and trunk upright    Transfers Overall transfer level: Needs assistance Equipment used: Rolling walker (2 wheels) Transfers: Sit to/from Stand Sit to Stand: Mod assist, Min assist, +2 safety/equipment           General transfer comment: verbal cues for technique and weight shift, initially requiring mod assist however able to progress to min assist; performed 3 sit to stands during mobility due to rest breaks    Ambulation/Gait Ambulation/Gait assistance: Min assist Gait Distance (Feet): 60 Feet Assistive device: Rolling walker (2 wheels) Gait Pattern/deviations: Step-to pattern, Narrow base of support, Decreased stance time - right, Trunk flexed       General Gait Details: max verbal cues for posture and RW positioning; pt required 2 seated rest breaks and able to ambulate a total of 60 feet   Stairs             Wheelchair Mobility    Modified Rankin (Stroke Patients Only)       Balance                                            Cognition Arousal/Alertness: Awake/alert Behavior During Therapy: WFL for tasks assessed/performed Overall Cognitive Status: History of cognitive impairments - at baseline                                 General Comments: pt pleasant, alert and oriented, requires verbal cues and increased time with mobility        Exercises  General Comments        Pertinent Vitals/Pain Pain Assessment Pain Assessment: Faces Faces Pain Scale: Hurts a little bit Pain Location: R hip and thigh Pain Descriptors / Indicators: Grimacing, Guarding Pain Intervention(s): Repositioned, Monitored during session (pt reports only taking tylenol, only pain with movement)    Home Living                          Prior Function            PT Goals (current goals can now be found in the care plan section) Progress towards PT goals: Progressing toward goals    Frequency    Min 3X/week       PT Plan Current plan remains appropriate    Co-evaluation              AM-PAC PT "6 Clicks" Mobility   Outcome Measure  Help needed turning from your back to your side while in a flat bed without using bedrails?: A Lot Help needed moving from lying on your back to sitting on the side of a flat bed without using bedrails?: A Lot Help needed moving to and from a bed to a chair (including a wheelchair)?: A Lot Help needed standing up from a chair using your arms (e.g., wheelchair or bedside chair)?: A Lot Help needed to walk in hospital room?: A Lot Help needed climbing 3-5 steps with a railing? : Total 6 Click Score: 11    End of Session Equipment Utilized During Treatment: Gait belt Activity Tolerance: Patient tolerated treatment well Patient left: in chair;with call bell/phone within reach;with chair alarm set Nurse Communication: Mobility status PT Visit Diagnosis: Unsteadiness on feet (R26.81);Difficulty in walking, not elsewhere classified (R26.2);Muscle weakness (generalized) (M62.81);History of falling (Z91.81);Repeated falls (R29.6)     Time: [id number] PT Time Calculation (min) (ACUTE ONLY): 15 min  Charges:  $Gait Training: 8-22 mins                     Jannette Spanner PT, DPT Acute Rehabilitation Services Pager: [phone] Office: [place] 12/05/2021, 1:18 PM

## 2021-12-05 NOTE — TOC Transition Note (Addendum)
Transition of Care [facility]) - CM/SW Discharge Note   Patient Details  Name: Nancy Suarez MRN: [id number] Date of Birth: 1931/07/15  Transition [facility] Care [facility]) CM/SW Contact:  Lanier Clam, RN Phone Number: 12/05/2021, 9:32 AM   Clinical Narrative:SNF [facility] has bec/auth received. Awaiting d/c summary prior rm#,tel# for report, & PTAR.    -going t rm#110,nsg report tel#[phone]. PTAR called. No further CM needs.   Final next level of care: Skilled Nursing Facility Barriers to Discharge: No Barriers Identified   Patient Goals and CMS Choice Patient states their goals for this hospitalization and ongoing recovery are:: SNF CMS Medicare.gov Compare Post Acute Care list provided to:: Patient Represenative (must comment) Choice offered to / list presented to : Adult Children  Discharge Placement                       Discharge Plan and Services   Discharge Planning Services: CM Consult Post Acute Care Choice: Skilled Nursing Facility                               Social Determinants of Health (SDOH) Interventions     Readmission Risk Interventions    12/01/2021    1:14 PM  Readmission Risk Prevention Plan  Transportation Screening Complete  PCP or Specialist Appt within 3-5 Days Complete  HRI or Home Care Consult Complete  Social Work Consult for Recovery Care Planning/Counseling Complete  Palliative Care Screening Complete  Medication Review [employer]) Complete

## 2021-12-12 DIAGNOSIS — N1832 Chronic kidney disease, stage 3b: Secondary | ICD-10-CM | POA: Diagnosis not present

## 2021-12-12 DIAGNOSIS — S72001D Fracture of unspecified part of neck of right femur, subsequent encounter for closed fracture with routine healing: Secondary | ICD-10-CM | POA: Diagnosis not present

## 2021-12-12 DIAGNOSIS — M6281 Muscle weakness (generalized): Secondary | ICD-10-CM | POA: Diagnosis not present

## 2021-12-12 DIAGNOSIS — D649 Anemia, unspecified: Secondary | ICD-10-CM | POA: Diagnosis not present

## 2021-12-15 DIAGNOSIS — M6281 Muscle weakness (generalized): Secondary | ICD-10-CM | POA: Diagnosis not present

## 2021-12-15 DIAGNOSIS — D649 Anemia, unspecified: Secondary | ICD-10-CM | POA: Diagnosis not present

## 2021-12-15 DIAGNOSIS — S72001D Fracture of unspecified part of neck of right femur, subsequent encounter for closed fracture with routine healing: Secondary | ICD-10-CM | POA: Diagnosis not present

## 2021-12-15 DIAGNOSIS — N1832 Chronic kidney disease, stage 3b: Secondary | ICD-10-CM | POA: Diagnosis not present

## 2021-12-16 DIAGNOSIS — S72141D Displaced intertrochanteric fracture of right femur, subsequent encounter for closed fracture with routine healing: Secondary | ICD-10-CM | POA: Diagnosis not present

## 2021-12-19 DIAGNOSIS — D649 Anemia, unspecified: Secondary | ICD-10-CM | POA: Diagnosis not present

## 2021-12-19 DIAGNOSIS — M6281 Muscle weakness (generalized): Secondary | ICD-10-CM | POA: Diagnosis not present

## 2021-12-19 DIAGNOSIS — N1832 Chronic kidney disease, stage 3b: Secondary | ICD-10-CM | POA: Diagnosis not present

## 2021-12-19 DIAGNOSIS — S72001D Fracture of unspecified part of neck of right femur, subsequent encounter for closed fracture with routine healing: Secondary | ICD-10-CM | POA: Diagnosis not present

## 2021-12-22 DIAGNOSIS — D649 Anemia, unspecified: Secondary | ICD-10-CM | POA: Diagnosis not present

## 2021-12-22 DIAGNOSIS — N1832 Chronic kidney disease, stage 3b: Secondary | ICD-10-CM | POA: Diagnosis not present

## 2021-12-22 DIAGNOSIS — M6281 Muscle weakness (generalized): Secondary | ICD-10-CM | POA: Diagnosis not present

## 2021-12-22 DIAGNOSIS — S72001D Fracture of unspecified part of neck of right femur, subsequent encounter for closed fracture with routine healing: Secondary | ICD-10-CM | POA: Diagnosis not present

## 2021-12-25 DIAGNOSIS — Z87891 Personal history of nicotine dependence: Secondary | ICD-10-CM | POA: Diagnosis not present

## 2021-12-25 DIAGNOSIS — I129 Hypertensive chronic kidney disease with stage 1 through stage 4 chronic kidney disease, or unspecified chronic kidney disease: Secondary | ICD-10-CM | POA: Diagnosis not present

## 2021-12-25 DIAGNOSIS — Z7901 Long term (current) use of anticoagulants: Secondary | ICD-10-CM | POA: Diagnosis not present

## 2021-12-25 DIAGNOSIS — M1711 Unilateral primary osteoarthritis, right knee: Secondary | ICD-10-CM | POA: Diagnosis not present

## 2021-12-25 DIAGNOSIS — Z7982 Long term (current) use of aspirin: Secondary | ICD-10-CM | POA: Diagnosis not present

## 2021-12-25 DIAGNOSIS — E041 Nontoxic single thyroid nodule: Secondary | ICD-10-CM | POA: Diagnosis not present

## 2021-12-25 DIAGNOSIS — R2681 Unsteadiness on feet: Secondary | ICD-10-CM | POA: Diagnosis not present

## 2021-12-25 DIAGNOSIS — N1832 Chronic kidney disease, stage 3b: Secondary | ICD-10-CM | POA: Diagnosis not present

## 2021-12-25 DIAGNOSIS — D631 Anemia in chronic kidney disease: Secondary | ICD-10-CM | POA: Diagnosis not present

## 2021-12-25 DIAGNOSIS — G4709 Other insomnia: Secondary | ICD-10-CM | POA: Diagnosis not present

## 2021-12-25 DIAGNOSIS — K219 Gastro-esophageal reflux disease without esophagitis: Secondary | ICD-10-CM | POA: Diagnosis not present

## 2021-12-25 DIAGNOSIS — E7800 Pure hypercholesterolemia, unspecified: Secondary | ICD-10-CM | POA: Diagnosis not present

## 2021-12-25 DIAGNOSIS — Z9181 History of falling: Secondary | ICD-10-CM | POA: Diagnosis not present

## 2021-12-25 DIAGNOSIS — N8300 Follicular cyst of ovary, unspecified side: Secondary | ICD-10-CM | POA: Diagnosis not present

## 2021-12-25 DIAGNOSIS — R410 Disorientation, unspecified: Secondary | ICD-10-CM | POA: Diagnosis not present

## 2021-12-25 DIAGNOSIS — N179 Acute kidney failure, unspecified: Secondary | ICD-10-CM | POA: Diagnosis not present

## 2021-12-25 DIAGNOSIS — K859 Acute pancreatitis without necrosis or infection, unspecified: Secondary | ICD-10-CM | POA: Diagnosis not present

## 2021-12-25 DIAGNOSIS — W19XXXD Unspecified fall, subsequent encounter: Secondary | ICD-10-CM | POA: Diagnosis not present

## 2021-12-25 DIAGNOSIS — M479 Spondylosis, unspecified: Secondary | ICD-10-CM | POA: Diagnosis not present

## 2021-12-25 DIAGNOSIS — N281 Cyst of kidney, acquired: Secondary | ICD-10-CM | POA: Diagnosis not present

## 2021-12-25 DIAGNOSIS — N952 Postmenopausal atrophic vaginitis: Secondary | ICD-10-CM | POA: Diagnosis not present

## 2021-12-25 DIAGNOSIS — M419 Scoliosis, unspecified: Secondary | ICD-10-CM | POA: Diagnosis not present

## 2021-12-25 DIAGNOSIS — S72141D Displaced intertrochanteric fracture of right femur, subsequent encounter for closed fracture with routine healing: Secondary | ICD-10-CM | POA: Diagnosis not present

## 2021-12-25 DIAGNOSIS — M8580 Other specified disorders of bone density and structure, unspecified site: Secondary | ICD-10-CM | POA: Diagnosis not present

## 2021-12-25 DIAGNOSIS — M6281 Muscle weakness (generalized): Secondary | ICD-10-CM | POA: Diagnosis not present

## 2021-12-25 DIAGNOSIS — F339 Major depressive disorder, recurrent, unspecified: Secondary | ICD-10-CM | POA: Diagnosis not present

## 2022-01-23 DIAGNOSIS — Z471 Aftercare following joint replacement surgery: Secondary | ICD-10-CM | POA: Diagnosis not present

## 2022-01-23 DIAGNOSIS — M25562 Pain in left knee: Secondary | ICD-10-CM | POA: Diagnosis not present

## 2022-01-23 DIAGNOSIS — S72141A Displaced intertrochanteric fracture of right femur, initial encounter for closed fracture: Secondary | ICD-10-CM | POA: Diagnosis not present

## 2022-01-23 DIAGNOSIS — M25512 Pain in left shoulder: Secondary | ICD-10-CM | POA: Diagnosis not present

## 2022-01-28 DIAGNOSIS — M8580 Other specified disorders of bone density and structure, unspecified site: Secondary | ICD-10-CM | POA: Diagnosis not present

## 2022-01-28 DIAGNOSIS — I129 Hypertensive chronic kidney disease with stage 1 through stage 4 chronic kidney disease, or unspecified chronic kidney disease: Secondary | ICD-10-CM | POA: Diagnosis not present

## 2022-01-28 DIAGNOSIS — R413 Other amnesia: Secondary | ICD-10-CM | POA: Diagnosis not present

## 2022-01-28 DIAGNOSIS — K5900 Constipation, unspecified: Secondary | ICD-10-CM | POA: Diagnosis not present

## 2022-01-28 DIAGNOSIS — E7800 Pure hypercholesterolemia, unspecified: Secondary | ICD-10-CM | POA: Diagnosis not present

## 2022-01-28 DIAGNOSIS — D509 Iron deficiency anemia, unspecified: Secondary | ICD-10-CM | POA: Diagnosis not present

## 2022-01-28 DIAGNOSIS — K219 Gastro-esophageal reflux disease without esophagitis: Secondary | ICD-10-CM | POA: Diagnosis not present

## 2022-01-28 DIAGNOSIS — S72001D Fracture of unspecified part of neck of right femur, subsequent encounter for closed fracture with routine healing: Secondary | ICD-10-CM | POA: Diagnosis not present

## 2022-01-28 DIAGNOSIS — F5104 Psychophysiologic insomnia: Secondary | ICD-10-CM | POA: Diagnosis not present

## 2022-01-28 DIAGNOSIS — M5136 Other intervertebral disc degeneration, lumbar region: Secondary | ICD-10-CM | POA: Diagnosis not present

## 2022-01-28 DIAGNOSIS — D631 Anemia in chronic kidney disease: Secondary | ICD-10-CM | POA: Diagnosis not present

## 2022-01-28 DIAGNOSIS — E049 Nontoxic goiter, unspecified: Secondary | ICD-10-CM | POA: Diagnosis not present

## 2022-01-28 DIAGNOSIS — F32A Depression, unspecified: Secondary | ICD-10-CM | POA: Diagnosis not present

## 2022-01-28 DIAGNOSIS — W19XXXD Unspecified fall, subsequent encounter: Secondary | ICD-10-CM | POA: Diagnosis not present

## 2022-01-28 DIAGNOSIS — N1832 Chronic kidney disease, stage 3b: Secondary | ICD-10-CM | POA: Diagnosis not present

## 2022-01-28 DIAGNOSIS — M170 Bilateral primary osteoarthritis of knee: Secondary | ICD-10-CM | POA: Diagnosis not present

## 2022-02-02 ENCOUNTER — Emergency Department (HOSPITAL_BASED_OUTPATIENT_CLINIC_OR_DEPARTMENT_OTHER)
Admission: EM | Admit: 2022-02-02 | Discharge: 2022-02-02 | Disposition: A | Discharge status: Home / Self Care | Payer: Medicare Other | Attending: Emergency Medicine | Admitting: Emergency Medicine

## 2022-02-02 ENCOUNTER — Other Ambulatory Visit: Payer: Self-pay

## 2022-02-02 ENCOUNTER — Emergency Department (HOSPITAL_BASED_OUTPATIENT_CLINIC_OR_DEPARTMENT_OTHER): Payer: Medicare Other

## 2022-02-02 ENCOUNTER — Encounter (HOSPITAL_BASED_OUTPATIENT_CLINIC_OR_DEPARTMENT_OTHER): Payer: Self-pay | Admitting: Emergency Medicine

## 2022-02-02 DIAGNOSIS — I129 Hypertensive chronic kidney disease with stage 1 through stage 4 chronic kidney disease, or unspecified chronic kidney disease: Secondary | ICD-10-CM | POA: Insufficient documentation

## 2022-02-02 DIAGNOSIS — Z79899 Other long term (current) drug therapy: Secondary | ICD-10-CM | POA: Insufficient documentation

## 2022-02-02 DIAGNOSIS — R911 Solitary pulmonary nodule: Secondary | ICD-10-CM | POA: Insufficient documentation

## 2022-02-02 DIAGNOSIS — R4182 Altered mental status, unspecified: Secondary | ICD-10-CM | POA: Diagnosis not present

## 2022-02-02 DIAGNOSIS — I10 Essential (primary) hypertension: Secondary | ICD-10-CM | POA: Diagnosis not present

## 2022-02-02 DIAGNOSIS — N1832 Chronic kidney disease, stage 3b: Secondary | ICD-10-CM | POA: Diagnosis not present

## 2022-02-02 DIAGNOSIS — R059 Cough, unspecified: Secondary | ICD-10-CM | POA: Diagnosis not present

## 2022-02-02 LAB — URINALYSIS, ROUTINE W REFLEX MICROSCOPIC
Bilirubin Urine: NEGATIVE
Glucose, UA: NEGATIVE mg/dL
Hgb urine dipstick: NEGATIVE
Ketones, ur: NEGATIVE mg/dL
Leukocytes,Ua: NEGATIVE
Nitrite: NEGATIVE
Protein, ur: NEGATIVE mg/dL
Specific Gravity, Urine: 1.025 (ref 1.005–1.030)
pH: 5.5 (ref 5.0–8.0)

## 2022-02-02 LAB — COMPREHENSIVE METABOLIC PANEL
ALT: 16 U/L (ref 0–44)
AST: 19 U/L (ref 15–41)
Albumin: 3.8 g/dL (ref 3.5–5.0)
Alkaline Phosphatase: 94 U/L (ref 38–126)
Anion gap: 3 — ABNORMAL LOW (ref 5–15)
BUN: 21 mg/dL (ref 8–23)
CO2: 28 mmol/L (ref 22–32)
Calcium: 9.3 mg/dL (ref 8.9–10.3)
Chloride: 111 mmol/L (ref 98–111)
Creatinine, Ser: 1.28 mg/dL — ABNORMAL HIGH (ref 0.44–1.00)
GFR, Estimated: 40 mL/min — ABNORMAL LOW (ref >60)
Glucose, Bld: 96 mg/dL (ref 70–99)
Potassium: 4.5 mmol/L (ref 3.5–5.1)
Sodium: 142 mmol/L (ref 135–145)
Total Bilirubin: 0.4 mg/dL (ref 0.3–1.2)
Total Protein: 7 g/dL (ref 6.5–8.1)

## 2022-02-02 LAB — CBC WITH DIFFERENTIAL/PLATELET
Abs Immature Granulocytes: 0.03 10*3/uL (ref 0.00–0.07)
Basophils Absolute: 0.1 10*3/uL (ref 0.0–0.1)
Basophils Relative: 1 %
Eosinophils Absolute: 0.1 10*3/uL (ref 0.0–0.5)
Eosinophils Relative: 2 %
HCT: 34 % — ABNORMAL LOW (ref 36.0–46.0)
Hemoglobin: 10.8 g/dL — ABNORMAL LOW (ref 12.0–15.0)
Immature Granulocytes: 1 %
Lymphocytes Relative: 29 %
Lymphs Abs: 1.5 10*3/uL (ref 0.7–4.0)
MCH: 29.3 pg (ref 26.0–34.0)
MCHC: 31.8 g/dL (ref 30.0–36.0)
MCV: 92.1 fL (ref 80.0–100.0)
Monocytes Absolute: 0.5 10*3/uL (ref 0.1–1.0)
Monocytes Relative: 10 %
Neutro Abs: 2.9 10*3/uL (ref 1.7–7.7)
Neutrophils Relative %: 57 %
Platelets: 211 10*3/uL (ref 150–400)
RBC: 3.69 MIL/uL — ABNORMAL LOW (ref 3.87–5.11)
RDW: 13.4 % (ref 11.5–15.5)
WBC: 5 10*3/uL (ref 4.0–10.5)
nRBC: 0 % (ref 0.0–0.2)

## 2022-02-02 NOTE — ED Notes (Signed)
Patient's son given discharge instructions. Questions were answered. Son verbalized understanding of discharge instructions and care at home.

## 2022-02-02 NOTE — ED Notes (Signed)
Pt. Had an IV in her L AC and was removed before discharge

## 2022-02-02 NOTE — ED Provider Notes (Signed)
[facility] EMERGENCY DEPARTMENT Provider Note   CSN: [id number] Arrival date & time: 02/02/22  1717     History  Chief Complaint  Patient presents with   Altered Mental Status    Nancy Suarez is a 88 y.o. female with a PMHx of CKD 3b, HTN, HLD, and depression presents to the emergency department after being brought in by family.  Patient lives at home with her daughter and son-in-law.  Patient's son is at bedside who gives history.  Patient's son in law reports the patient has been acting different for the past few days. Patient was found wandering around the house and asking herself where she is.  The patient has been living in the same house for the past 3 years.  Patient herself denies any dysuria, falls, chest pain, shortness of breath, fevers, chills, or hematuria.  Patient does endorse a productive cough for the past week.  Patient reports that this cough gets worse with laying flat.  Of note, patient's only recent medication change includes getting off of Lexapro with 3 weeks ago, starting Zoloft, and then going back on Lexapro because she did not tolerate Zoloft.  Patient has been hospitalized in the past for possible polypharmacy 04/2021.  It is also noted that the patient has a cognitive impairment, and is seen by neurology, but has not been formally diagnosed with any dementia.   Altered Mental Status Associated symptoms: no abdominal pain, no fever, no nausea, no vomiting and no weakness        Home Medications Prior to Admission medications   Medication Sig Start Date End Date Taking? Authorizing Provider  acetaminophen (TYLENOL) 325 MG tablet Take 1 tablet (325 mg total) by mouth every 6 (six) hours as needed for mild pain. 12/05/21   Jerald Kief, MD  acetaminophen (TYLENOL) 500 MG tablet Take 2 tablets (1,000 mg total) by mouth 2 (two) times daily. 12/05/21   Jerald Kief, MD  amLODipine (NORVASC) 2.5 MG tablet Take 1 tablet (2.5 mg total) by mouth  daily. 12/05/21 01/04/22  Jerald Kief, MD  apixaban (ELIQUIS) 2.5 MG TABS tablet Take 1 tablet (2.5 mg total) by mouth 2 (two) times daily. 11/30/21   Hill, Alain Honey, PA-C  atorvastatin (LIPITOR) 20 MG tablet Take 20 mg by mouth daily. 09/13/21   [provider]  carboxymethylcellulose (REFRESH PLUS) 0.5 % SOLN Place 1 drop into both eyes in the morning, at noon, and at bedtime.    [provider]  escitalopram (LEXAPRO) 10 MG tablet Take 10 mg by mouth daily.    [provider]  famotidine (PEPCID) 20 MG tablet Take 20 mg by mouth 2 (two) times daily.    [provider]  ferrous sulfate 325 (65 FE) MG tablet Take 1 tablet (325 mg total) by mouth daily with breakfast. 12/06/21 01/05/22  Jerald Kief, MD  Fexofenadine HCl [facility] ALLERGY PO) Take 1 tablet by mouth daily.    [provider]  Melatonin 10 MG TABS Take 10-20 mg by mouth at bedtime.    [provider]  mirtazapine (REMERON) 15 MG tablet Take 15 mg by mouth at bedtime.    [provider]  Multiple Vitamin (MULTIVITAMIN WITH MINERALS) TABS tablet Take 1 tablet by mouth daily.    [provider]  polyethylene glycol (MIRALAX / GLYCOLAX) 17 g packet Take 17 g by mouth daily. 12/06/21   Jerald Kief, MD  QUEtiapine (SEROQUEL) 25 MG tablet Take 1  tablet (25 mg total) by mouth at bedtime. 12/05/21 01/04/22  Jerald Kief, MD  senna (SENOKOT) 8.6 MG TABS tablet Take 1 tablet (8.6 mg total) by mouth 2 (two) times daily. 12/05/21   Jerald Kief, MD      Allergies    Ambien [zolpidem], Aspirin, Codeine, Namenda [memantine], Percocet [oxycodone-acetaminophen], and Sulfa antibiotics    Review of Systems   Review of Systems  Constitutional:  Negative for chills and fever.  HENT:  Negative for sore throat.   Respiratory:  Positive for cough. Negative for shortness of breath and wheezing.   Cardiovascular:  Negative for chest pain.  Gastrointestinal:  Negative for abdominal  pain, nausea and vomiting.  Genitourinary:  Negative for dysuria, hematuria and urgency.  Neurological:  Negative for dizziness, speech difficulty and weakness.    Physical Exam Updated Vital Signs BP (!) 165/103   Pulse 77   Temp 98.3 F (36.8 C) (Oral)   Resp (!) 21   Ht 5\' 5"  (1.651 m)   Wt 65.1 kg   SpO2 96%   BMI 23.88 kg/m  Physical Exam  General: Alert and orientated x3. Patient is resting comfortably in wheelchair in no acute distress  Eyes:EOM intact  Head: Normocephalic, atraumatic  Cardio: Regular rate and rhythm, no murmurs, rubs or gallops. 2+ pulses to bilateral upper and lower extremities  Pulmonary: Clear to ausculation bilaterally with no rales, rhonchi, and crackles  Neuro: CN II-XII intact. Sensation intact to upper and lower extremities. Skin: No rashes noted  MSK: 5/5 strength to upper and lower extremities.   ED Results / Procedures / Treatments   Labs (all labs ordered are listed, but only abnormal results are displayed) Labs Reviewed  CBC WITH DIFFERENTIAL/PLATELET - Abnormal; Notable for the following components:      Result Value   RBC 3.69 (*)    Hemoglobin 10.8 (*)    HCT 34.0 (*)    All other components within normal limits  COMPREHENSIVE METABOLIC PANEL - Abnormal; Notable for the following components:   Creatinine, Ser 1.28 (*)    GFR, Estimated 40 (*)    Anion gap 3 (*)    All other components within normal limits  URINE CULTURE  SARS CORONAVIRUS 2 BY RT PCR  URINALYSIS, ROUTINE W REFLEX MICROSCOPIC    EKG EKG Interpretation  Date/Time:  Thursday February 02 2022 18:33:17 EDT Ventricular Rate:  59 PR Interval:  170 QRS Duration: 93 QT Interval:  416 QTC Calculation: 413 R Axis:   -5 Text Interpretation: Sinus rhythm Abnormal R-wave progression, late transition Left ventricular hypertrophy No significant change since last tracing Confirmed by [id number] (Gwyneth Sprout) on 02/02/2022 6:37:07 PM  Radiology DG Chest Port 1  View  Result Date: 02/02/2022 CLINICAL DATA:  Cough, altered mental status EXAM: PORTABLE CHEST 1 VIEW COMPARISON:  11/27/2021 FINDINGS: There is marked elevation of right hemidiaphragm which may be due to eventration or paralysis. Cardiac size is within normal limits. Thoracic aorta is tortuous and ectatic. There is 1.9 cm nodular density with slightly spiculated margins in the right upper lung field. Possibility of malignant neoplasm should be considered. There are small linear densities in both lower lung fields suggesting subsegmental atelectasis. There is no focal pulmonary consolidation. Left lateral CP angle is indistinct. There is no pneumothorax. There is prominence of right paratracheal soft tissues. IMPRESSION: There is 1.9 cm nodule in right upper lung field. Possibility of malignant neoplasm is not excluded. Follow-up CT chest should be considered. There is  prominence of right paratracheal soft tissues in superior mediastinum which may be due to tortuous brachiocephalic vessels or abnormal lymphadenopathy. This finding could also be evaluated in the CT chest. Small linear densities in the lower lung fields may suggest scarring or subsegmental atelectasis. There is possible minimal left pleural effusion. Electronically Signed   By: Elmer Picker M.D.   On: 02/02/2022 18:37    Procedures Procedures    Medications Ordered in ED Medications - No data to display  ED Course/ Medical Decision Making/ A&P                           Medical Decision Making This is a 88 year old female with a past medical history of CKD stage IIIb presenting to the emergency department with altered mental status.  Patient's family reports that the patient has been confused for the past few days.  They think that it could be a UTI.  Patient's family member reports that the last time she acted like this she had a UTI.  Given history, it seems like it is not a UTI.  Patient denies any dysuria or frequency.   Patient does report that she does have a cough for the past week that is productive.  I will obtain chest x-ray for this.  Patient also mental status, I will obtain a CMP at this time.  I will also obtain CBC and UA.  Chest x-ray will also be ordered.  This could be polypharmacy versus uremia.  This could be new baseline.  Patient does have cognitive impairment.  Amount and/or Complexity of Data Reviewed Labs: ordered. Radiology: ordered.    1900: Patient was reassessed at this time.  Patient agitated given that random people were coming in and out of her room when tests were ordered.  I informed her that her UA came back negative.  I informed her that her x-ray was showing a nodule but no pneumonia at this time.  She is understanding about this. She states that she will follow this up outpatient.  Patient requested to go home and I told her that she needs to wait until the lab results come back.  1950: Patient's CMP showing baseline kidney function and no electrolyte imbalances.  CBC showing that her hemoglobin from 2 months ago is up to 10.8 from 7.3.  No white count.  Patient is showing no signs of systemic infection at this point.  Kidney function does not affect it at this point. Given that the work-up was completely benign, patient is stable for discharge. Nurse informs me, that the patient opened up to her and stated that the patient has been acting up because her daughter has not been home.  Patient states that she feels fine.  Patient stable for discharge at this point.  Patient's son-in-law will pick her up. Plan for lung nodule to be evaluated outpatient with CT which patient is agreeable to.        Final Clinical Impression(s) / ED Diagnoses Final diagnoses:  None    Rx / DC Orders ED Discharge Orders     None         Leigh Aurora, DO 02/02/22 2034    Blanchie Dessert, MD 02/02/22 2052

## 2022-02-02 NOTE — ED Notes (Signed)
Pt. Refusing to have task done and completed.

## 2022-02-02 NOTE — Discharge Instructions (Signed)
Nancy Suarez,Thank you for allowing me to take part in your care today.  Here is what we discussed today.  1.  You are brought in today, because it was thought that you had altered mental status.  On work-up, we did not find any infection or any other causes that could cause this.  We checked for pneumonia and we did not find any pneumonia.  Your kidney function looks at baseline today.  Please continue to follow-up with your primary care physician.  2.  On imaging we did find a nodule on your lung.  Please follow-up with your primary care physician outpatient for a CT scan of your chest.  3.  Please continue to take your home medications as prescribed.  4.  If you develop any symptoms such as fever, chills, shortness of breath, chest pain, or urinary symptoms please return to the emergency department to be evaluated.  Thank you, Dr. Allena Katz

## 2022-02-02 NOTE — ED Triage Notes (Signed)
Family reports pt has been "talking out of her head" x 2 days. Family concerned for uti. Hx of same.

## 2022-02-04 LAB — URINE CULTURE

## 2022-02-09 ENCOUNTER — Telehealth: Payer: Self-pay | Admitting: Family Medicine

## 2022-02-09 NOTE — Telephone Encounter (Signed)
Pt's daughter, Ryin Schillo called pt's memory loss worsening, more agitated, forgetting things more frequently, and seeing things.  Have schedule appt on 02/13/22 at 3:30 pm

## 2022-02-09 NOTE — Telephone Encounter (Signed)
Called and LVM for daughter relaying AL,NP message. Asked her to call back to ensure she received message.

## 2022-02-09 NOTE — Telephone Encounter (Signed)
LVM for daughter, Byrd Hesselbach again to call office.

## 2022-02-13 ENCOUNTER — Ambulatory Visit: Payer: Medicare Other | Admitting: Family Medicine

## 2022-02-13 ENCOUNTER — Other Ambulatory Visit: Payer: Self-pay | Admitting: *Deleted

## 2022-02-13 ENCOUNTER — Encounter: Payer: Self-pay | Admitting: Family Medicine

## 2022-02-13 NOTE — Patient Outreach (Signed)
  Care Coordination   02/13/2022 Name: Nancy Suarez MRN: [id number] DOB: 1932-06-08   Care Coordination Outreach Attempts:  An unsuccessful telephone outreach was attempted today to offer the patient information about available care coordination services as a benefit of their health plan.   Follow Up Plan:  Additional outreach attempts will be made to offer the patient care coordination information and services.   Encounter Outcome:  No Answer  Care Coordination Interventions Activated:  No   Care Coordination Interventions:  No, not indicated    Nesiah Jump C. Burgess Estelle, MSN, [facility] Gerontological Nurse Practitioner [facility] Care Management [phone]

## 2022-02-13 NOTE — Progress Notes (Unsigned)
No chief complaint on file.   HISTORY OF PRESENT ILLNESS:  UPDATE 02/13/22 JM: Patient returns for acute visit due to worsening cognition with behaviors.  She was evaluated in the ED 8/3 as family reported patient differently the past few days with increased confusion.  Noted recent medication change of stopping Lexapro and switching to Zoloft and then back to Lexapro as she had difficulty tolerating Zoloft.  Extensive lab and urine work-up largely unremarkable. Declined CT imaging at that time. Per ED note, daughter out of town for business which seemed to cause significant stress to the patient and did report to a nurse "been acting up because her daughter has not been home.  Patient states that she feels fine."  She had been previously hospitalized back in 04/2021 for AMS and felt due to polypharmacy.  She was discharged back home after 3 hours.  Hospitalized 5/28 -6/5 with right hip fracture 2/2 fall and underwent operative management.  Noted increased agitation and confusion after trial of narcotic and improved after discontinuing.  Placed on trial of Seroquel at night.  Discharged to SNF.  Continued use of Lexapro 10 mg daily, mirtazapine 15 mg nightly and Seroquel 25 mg nightly        History provided for reference purposes only Update 06/07/2021 ALL: MISKI FELDPAUSCH is a 88 y.o. female here today for follow up for memory loss. She was last seen by Dr Rexene Alberts 01/2021. PCP had decreased Ambien dose to 32m QHS and decreased gabapentin dose. She continued hydrocodone twice daily, Remeron 1107mQHS and lexapro 1069maily. Dr AthRexene Albertss hesitant to start Aricept due to low heart rate and opted to start low dose Namenda. She was advised to start 5mg34mily for 1 month then increase to BID dosing. Her daughter called to report behavioral changes 03/10/2021. She was advised to stop memantine. Daughter called again 03/30/2021 with acute behavioral concerns and advised to seek eval with PCP for possible  infection. She was hospitalized for UTI 04/13/2021. At discharge, Ambien was discontinued. She was encouraged to continue weaning Remeron and Percocet (switched from hydrocodone? By PCP). She reports being completely weaned off Percocet. She continues Remeron 15mg28m and escitalopram 10mg 41my. She was placed in resite care at [facility] weeks following discharge. She reports having two falls while there. She is using a walker. She has been participating in PT for the past 3 weeks. She lives with her daughter, [name].Verdis Fredericksonreports that [name] [name] her take a bath. She reports her appetite is really good. She is sleeping well. She does not drive.    HISTORY (copied from Dr Athar'Guadelupe Sabinous note)  Ms. Fuertes Creswell 89 yea68old right-handed woman with an underlying medical history of reflux disease, goiter, hypertension, hyperlipidemia, osteopenia, depression, pancreatitis, spinal arthritis, scoliosis, anemia, chronic kidney disease, anxiety, joint pain and insomnia, who presents for FU consultation of her memory loss. The patient is accompanied by her daughter, [name],Verdis Fredericksonn today.  I first met her on 09/06/20 at the request of her PCP, at which time she was reported to have forgetfulness for at least 6 months.  Her MMSE was 20/30 at the time. I suggest we proceed with additional blood work and a brain MRI.  Her labs included Hemoglobin A1c, vitamin B6, vitamin D, CRP, rheumatoid factor, B12 and B1.Her rheumatoid factor was positive.  Other test results were benign.  She was notified of the results.We had also talked about medication affecting her cognitive function as she  was on multiple medications at the time. She was advised to discuss medication management with her primary care physician. She had a brain MRI without contrast on 09/23/2020 and I reviewed the results:   IMPRESSION: Unremarkable MRI scan of the brain without contrast showing only mild age-related changes of chronic small vessel disease and  generalized cerebral atrophy.   She was notified of the test results.   Today, 03/01/21: She reports feeling fairly stable, she does not give much in the way of her own history but reports that she has been on Ambien for many years.  She is reluctant to come off of it.  Daughter reports that patient has been staying with her for nearly 2 years in December of this year.  Patient has had recent mood irritability and frustration.  Daughter reports that her stepson moved in recently and is currently staying with them, he is 21 years old.  Stepdaughter is 48 years old and was also staying with them temporarily and just a few days ago moved out.  While the stepchildren were staying in their home patient's daughter reports that she took a brief trip with her husband for their anniversary.  While she was gone, patient had more mood irritability and refused to take her medications.  The day.  They came back this past weekend, patient had taken her Ambien and had a fall as she got up shortly after taking the Ambien.  Thankfully, she did not injure herself.  She did not lose consciousness or hit her head but daughter reports that she looked really dazed and groggy.  Patient's daughter is still concerned about patient's medications.  However, her primary care physician has been able to reduce her medications, she is currently on Ambien 5 mg strength half a pill daily at bedtime.  Gabapentin has been reduced from 300 mg to 100 mg and she currently takes it only once a day.  Hydrocodone is currently twice daily as needed.  She typically does take it twice daily.  She continues to be on Remeron 15 mg at bedtime.  She continues to be on Lexapro 10 mg daily.  Memory function per se is stable with the exception that patient has confusion from time to time per daughter.  Patient lives with [name], [place] husband and their 6 year old daughter.  Per [name], patient is very close to her 22 year old granddaughter but due to recent mood  irritability, that relationship has been strained.  Daughter just started school after the summer break, fifth grade.     REVIEW OF SYSTEMS: Out of a complete 14 system review of symptoms, the patient complains only of the following symptoms, hip and knee pain, memory loss and all other reviewed systems are negative.   ALLERGIES: Allergies  Allergen Reactions   Ambien [Zolpidem] Other (See Comments)    Disoriented, aggressive   Aspirin     Upsets stomach if 363m   Codeine Nausea And Vomiting   Namenda [Memantine]     Agitation, aggression   Percocet [Oxycodone-Acetaminophen] Other (See Comments)    Disoriented, aggressive   Sulfa Antibiotics Nausea Only     HOME MEDICATIONS: Outpatient Medications Prior to Visit  Medication Sig Dispense Refill   acetaminophen (TYLENOL) 325 MG tablet Take 1 tablet (325 mg total) by mouth every 6 (six) hours as needed for mild pain. 30 tablet 0   acetaminophen (TYLENOL) 500 MG tablet Take 2 tablets (1,000 mg total) by mouth 2 (two) times daily. 30 tablet 0   amLODipine (  NORVASC) 2.5 MG tablet Take 1 tablet (2.5 mg total) by mouth daily. 30 tablet 0   apixaban (ELIQUIS) 2.5 MG TABS tablet Take 1 tablet (2.5 mg total) by mouth 2 (two) times daily. 60 tablet 0   atorvastatin (LIPITOR) 20 MG tablet Take 20 mg by mouth daily.     carboxymethylcellulose (REFRESH PLUS) 0.5 % SOLN Place 1 drop into both eyes in the morning, at noon, and at bedtime.     escitalopram (LEXAPRO) 10 MG tablet Take 10 mg by mouth daily.     famotidine (PEPCID) 20 MG tablet Take 20 mg by mouth 2 (two) times daily.     ferrous sulfate 325 (65 FE) MG tablet Take 1 tablet (325 mg total) by mouth daily with breakfast. 30 tablet 0   Fexofenadine HCl (ALLEGRA ALLERGY PO) Take 1 tablet by mouth daily.     Melatonin 10 MG TABS Take 10-20 mg by mouth at bedtime.     mirtazapine (REMERON) 15 MG tablet Take 15 mg by mouth at bedtime.     Multiple Vitamin (MULTIVITAMIN WITH MINERALS) TABS  tablet Take 1 tablet by mouth daily.     polyethylene glycol (MIRALAX / GLYCOLAX) 17 g packet Take 17 g by mouth daily. 14 each 0   QUEtiapine (SEROQUEL) 25 MG tablet Take 1 tablet (25 mg total) by mouth at bedtime. 30 tablet 0   senna (SENOKOT) 8.6 MG TABS tablet Take 1 tablet (8.6 mg total) by mouth 2 (two) times daily. 120 tablet 0   No facility-administered medications prior to visit.     PAST MEDICAL HISTORY: Past Medical History:  Diagnosis Date   ASCUS (atypical squamous cells of undetermined significance) on Pap smear    Atrophic vaginitis    Chronic insomnia    Depression    GERD (gastroesophageal reflux disease)    Goiter    History of kidney stones    Hypercholesteremia    Hypertension    LGSIL (low grade squamous intraepithelial dysplasia)    Osteopenia    Ovarian cyst    "shrank it"   Pancreatitis    Scoliosis    Spinal arthritis      PAST SURGICAL HISTORY: Past Surgical History:  Procedure Laterality Date   ABDOMINAL HYSTERECTOMY  1977   partial   INTRAMEDULLARY (IM) NAIL INTERTROCHANTERIC Right 11/28/2021   Procedure: INTRAMEDULLARY (IM) NAIL INTERTROCHANTRIC;  Surgeon: Rod Can, MD;  Location: [facility];  Service: Orthopedics;  Laterality: Right;   KIDNEY STONE SURGERY  1990's   2-3 stones x1 surgery   ORIF FEMUR FRACTURE Left 06/09/2017   Procedure: OPEN REDUCTION INTERNAL FIXATION (ORIF) DISTAL FEMUR FRACTURE;  Surgeon: Paralee Cancel, MD;  Location: [facility];  Service: Orthopedics;  Laterality: Left;   TONSILLECTOMY  as teenager   TOTAL KNEE ARTHROPLASTY Left 01/19/2014   Procedure: LEFT TOTAL KNEE ARTHROPLASTY;  Surgeon: Mauri Pole, MD;  Location: [facility];  Service: Orthopedics;  Laterality: Left;   WISDOM TOOTH EXTRACTION       FAMILY HISTORY: Family History  Problem Relation Age of Onset   Hypertension Father    Cancer Father    Cancer Mother        Bone   Cancer Brother      SOCIAL HISTORY: Social History   Socioeconomic History    Marital status: Married    Spouse name: Not on file   Number of children: Not on file   Years of education: Not on file   Highest education level: Not  on file  Occupational History   Not on file  Tobacco Use   Smoking status: Former    Years: 20.00    Types: Cigarettes    Quit date: 07/03/1978    Years since quitting: 43.6   Smokeless tobacco: Former  Scientific laboratory technician Use: Never used  Substance and Sexual Activity   Alcohol use: Yes    Comment: occ   Drug use: No   Sexual activity: Yes    Birth control/protection: Surgical    Comment: hysterectomy  Other Topics Concern   Not on file  Social History Narrative   Not on file   Social Determinants of Health   Financial Resource Strain: Not on file  Food Insecurity: Not on file  Transportation Needs: Not on file  Physical Activity: Not on file  Stress: Not on file  Social Connections: Not on file  Intimate Partner Violence: Not on file     PHYSICAL EXAM  There were no vitals filed for this visit.  There is no height or weight on file to calculate BMI.  Generalized: Well developed, in no acute distress  Cardiology: normal rate and rhythm, no murmur auscultated  Respiratory: clear to auscultation bilaterally    Neurological examination  Mentation: Alert oriented to year, place, most history taking. Follows all commands speech and language fluent Cranial nerve II-XII: Pupils were equal round reactive to light. Extraocular movements were full, visual field were full on confrontational test. Facial sensation and strength were normal. Uvula tongue midline. Head turning and shoulder shrug  were normal and symmetric. Motor: The motor testing reveals 5 over 5 strength of bilateral upper extremities, 4+/5 right lower, 3+/5 left hip flexion. Good symmetric motor tone is noted throughout.  Gait and station: Gait was not assessed, patient unaccompanied at visit and does not have walker. In wheelchair.    DIAGNOSTIC DATA  (LABS, IMAGING, TESTING) - I reviewed patient records, labs, notes, testing and imaging myself where available.  Lab Results  Component Value Date   WBC 5.0 02/02/2022   HGB 10.8 (L) 02/02/2022   HCT 34.0 (L) 02/02/2022   MCV 92.1 02/02/2022   PLT 211 02/02/2022      Component Value Date/Time   NA 142 02/02/2022 1851   K 4.5 02/02/2022 1851   CL 111 02/02/2022 1851   CO2 28 02/02/2022 1851   GLUCOSE 96 02/02/2022 1851   BUN 21 02/02/2022 1851   CREATININE 1.28 (H) 02/02/2022 1851   CALCIUM 9.3 02/02/2022 1851   PROT 7.0 02/02/2022 1851   ALBUMIN 3.8 02/02/2022 1851   AST 19 02/02/2022 1851   ALT 16 02/02/2022 1851   ALKPHOS 94 02/02/2022 1851   BILITOT 0.4 02/02/2022 1851   GFRNONAA 40 (L) 02/02/2022 1851   GFRAA 41 (L) 06/11/2017 0404   No results found for: "CHOL", "HDL", "LDLCALC", "LDLDIRECT", "TRIG", "CHOLHDL" Lab Results  Component Value Date   HGBA1C 4.9 09/06/2020   Lab Results  Component Value Date   LSLHTDSK87 681 09/06/2020   Lab Results  Component Value Date   TSH 1.233 11/29/2021       06/07/2021    9:03 AM 03/01/2021    8:13 AM 09/06/2020    2:56 PM  MMSE - Mini Mental State Exam  Orientation to time '3 3 4  ' Orientation to Place '5 4 4  ' Registration '3 3 3  ' Attention/ Calculation 1 1 0  Attention/Calculation-comments did WORLD    Recall '1 1 2  ' Language- name 2  objects '2 2 2  ' Language- repeat 1 1 0  Language- follow 3 step command '3 3 3  ' Language- read & follow direction '1 1 1  ' Write a sentence '1 1 1  ' Copy design 0 1 0  Total score '21 21 20         ' No data to display           ASSESSMENT AND PLAN  88 y.o. year old female  has a past medical history of ASCUS (atypical squamous cells of undetermined significance) on Pap smear, Atrophic vaginitis, Chronic insomnia, Depression, GERD (gastroesophageal reflux disease), Goiter, History of kidney stones, Hypercholesteremia, Hypertension, LGSIL (low grade squamous intraepithelial dysplasia),  Osteopenia, Ovarian cyst, Pancreatitis, Scoliosis, and Spinal arthritis. here with    No diagnosis found.   Daughter concerned regarding worsening memory loss with behavioral concerns MMSE ***/30 (prior 21/30)  Mrs Marcon is doing much better. She has completely weaned Percocet and Ambien. She does continue Remeron and escitalopram. MMSE stable from previous visit, 21/30. I have been able to connect with her daughter, [name], via telephone. She will continue to work with PT. Fall precautions and memory compensation strategies reviewed. She will follow up closely with PCP. Avoidance of sedating medicaitons recommended. We may consider memory agents in the future. Could consider formal neurocognitive testing, however, I do not feel it will change course of treatment. Healthy lifestyle habits encouraged. She will follow up with me in 6 months. She and her daughter verbalize understanding and agreement with this plan.    No orders of the defined types were placed in this encounter.     No orders of the defined types were placed in this encounter.    I spent 30 minutes of face-to-face and non-face-to-face time with patient.  This included previsit chart review, lab review, study review, order entry, electronic health record documentation, patient education.    Frann Rider, AGNP-BC  [facility] [address] [place], [place] [address]  Phone [phone] Fax [phone] Note: This document was prepared with digital dictation and possible smart phrase technology. Any transcriptional errors that result from this process are unintentional.  CC:  [facility] provider: Vincente Liberty, MD

## 2022-02-13 NOTE — Progress Notes (Deleted)
No chief complaint on file.   HISTORY OF PRESENT ILLNESS:  02/13/22 ALL:  Nancy Suarez returns for follow up for memory loss.   06/07/2021 ALL: Nancy Suarez is a 88 y.o. female here today for follow up for memory loss. She was last seen by Dr Rexene Alberts 01/2021. PCP had decreased Ambien dose to 68m QHS and decreased gabapentin dose. She continued hydrocodone twice daily, Remeron 149mQHS and lexapro 1046maily. Dr AthRexene Albertss hesitant to start Aricept due to low heart rate and opted to start low dose Namenda. She was advised to start 5mg76mily for 1 month then increase to BID dosing. Her daughter called to report behavioral changes 03/10/2021. She was advised to stop memantine. Daughter called again 03/30/2021 with acute behavioral concerns and advised to seek eval with PCP for possible infection. She was hospitalized for UTI 04/13/2021. At discharge, Ambien was discontinued. She was encouraged to continue weaning Remeron and Percocet (switched from hydrocodone? By PCP). She reports being completely weaned off Percocet. She continues Remeron 15mg85m and escitalopram 10mg 29my. She was placed in resite care at [facility] weeks following discharge. She reports having two falls while there. She is using a walker. She has been participating in PT for the past 3 weeks. She lives with her daughter, Nancy Suarez.Nancy Fredericksonreports that Nancy Suarez Nancy Suarez her take a bath. She reports her appetite is really good. She is sleeping well. She does not drive.    HISTORY (copied from Dr Athar'Guadelupe Sabinous note)  Nancy Suarez 89 yea46old right-handed woman with an underlying medical history of reflux disease, goiter, hypertension, hyperlipidemia, osteopenia, depression, pancreatitis, spinal arthritis, scoliosis, anemia, chronic kidney disease, anxiety, joint pain and insomnia, who presents for FU consultation of her memory loss. The patient is accompanied by her daughter, Nancy Suarez,Nancy Fredericksonn today.  I first met her on 09/06/20 at the request of  her PCP, at which time she was reported to have forgetfulness for at least 6 months.  Her MMSE was 20/30 at the time. I suggest we proceed with additional blood work and a brain MRI.  Her labs included Hemoglobin A1c, vitamin B6, vitamin D, CRP, rheumatoid factor, B12 and B1.Her rheumatoid factor was positive.  Other test results were benign.  She was notified of the results.We had also talked about medication affecting her cognitive function as she was on multiple medications at the time. She was advised to discuss medication management with her primary care physician. She had a brain MRI without contrast on 09/23/2020 and I reviewed the results:   IMPRESSION: Unremarkable MRI scan of the brain without contrast showing only mild age-related changes of chronic small vessel disease and generalized cerebral atrophy.   She was notified of the test results.   Today, 03/01/21: She reports feeling fairly stable, she does not give much in the way of her own history but reports that she has been on Ambien for many years.  She is reluctant to come off of it.  Daughter reports that patient has been staying with her for nearly 2 years in December of this year.  Patient has had recent mood irritability and frustration.  Daughter reports that her stepson moved in recently and is currently staying with them, he is 25 yea47 old.  Stepdaughter is 28 yea54 old and was also staying with them temporarily and just a few days ago moved out.  While the stepchildren were staying in their home patient's daughter reports that she took a brief trip  with her husband for their anniversary.  While she was gone, patient had more mood irritability and refused to take her medications.  The day.  They came back this past weekend, patient had taken her Ambien and had a fall as she got up shortly after taking the Ambien.  Thankfully, she did not injure herself.  She did not lose consciousness or hit her head but daughter reports that she looked  really dazed and groggy.  Patient's daughter is still concerned about patient's medications.  However, her primary care physician has been able to reduce her medications, she is currently on Ambien 5 mg strength half a pill daily at bedtime.  Gabapentin has been reduced from 300 mg to 100 mg and she currently takes it only once a day.  Hydrocodone is currently twice daily as needed.  She typically does take it twice daily.  She continues to be on Remeron 15 mg at bedtime.  She continues to be on Lexapro 10 mg daily.  Memory function per se is stable with the exception that patient has confusion from time to time per daughter.  Patient lives with Nancy Suarez, [place] husband and their 77 year old daughter.  Per Nancy Suarez, patient is very close to her 29 year old granddaughter but due to recent mood irritability, that relationship has been strained.  Daughter just started school after the summer break, fifth grade.     REVIEW OF SYSTEMS: Out of a complete 14 system review of symptoms, the patient complains only of the following symptoms, hip and knee pain, memory loss and all other reviewed systems are negative.   ALLERGIES: Allergies  Allergen Reactions   Ambien [Zolpidem] Other (See Comments)    Disoriented, aggressive   Aspirin     Upsets stomach if 350m   Codeine Nausea And Vomiting   Namenda [Memantine]     Agitation, aggression   Percocet [Oxycodone-Acetaminophen] Other (See Comments)    Disoriented, aggressive   Sulfa Antibiotics Nausea Only     HOME MEDICATIONS: Outpatient Medications Prior to Visit  Medication Sig Dispense Refill   acetaminophen (TYLENOL) 325 MG tablet Take 1 tablet (325 mg total) by mouth every 6 (six) hours as needed for mild pain. 30 tablet 0   acetaminophen (TYLENOL) 500 MG tablet Take 2 tablets (1,000 mg total) by mouth 2 (two) times daily. 30 tablet 0   amLODipine (NORVASC) 2.5 MG tablet Take 1 tablet (2.5 mg total) by mouth daily. 30 tablet 0   apixaban (ELIQUIS)  2.5 MG TABS tablet Take 1 tablet (2.5 mg total) by mouth 2 (two) times daily. 60 tablet 0   atorvastatin (LIPITOR) 20 MG tablet Take 20 mg by mouth daily.     carboxymethylcellulose (REFRESH PLUS) 0.5 % SOLN Place 1 drop into both eyes in the morning, at noon, and at bedtime.     escitalopram (LEXAPRO) 10 MG tablet Take 10 mg by mouth daily.     famotidine (PEPCID) 20 MG tablet Take 20 mg by mouth 2 (two) times daily.     ferrous sulfate 325 (65 FE) MG tablet Take 1 tablet (325 mg total) by mouth daily with breakfast. 30 tablet 0   Fexofenadine HCl (ALLEGRA ALLERGY PO) Take 1 tablet by mouth daily.     Melatonin 10 MG TABS Take 10-20 mg by mouth at bedtime.     mirtazapine (REMERON) 15 MG tablet Take 15 mg by mouth at bedtime.     Multiple Vitamin (MULTIVITAMIN WITH MINERALS) TABS tablet Take 1 tablet by mouth daily.  polyethylene glycol (MIRALAX / GLYCOLAX) 17 g packet Take 17 g by mouth daily. 14 each 0   QUEtiapine (SEROQUEL) 25 MG tablet Take 1 tablet (25 mg total) by mouth at bedtime. 30 tablet 0   senna (SENOKOT) 8.6 MG TABS tablet Take 1 tablet (8.6 mg total) by mouth 2 (two) times daily. 120 tablet 0   No facility-administered medications prior to visit.     PAST MEDICAL HISTORY: Past Medical History:  Diagnosis Date   ASCUS (atypical squamous cells of undetermined significance) on Pap smear    Atrophic vaginitis    Chronic insomnia    Depression    GERD (gastroesophageal reflux disease)    Goiter    History of kidney stones    Hypercholesteremia    Hypertension    LGSIL (low grade squamous intraepithelial dysplasia)    Osteopenia    Ovarian cyst    "shrank it"   Pancreatitis    Scoliosis    Spinal arthritis      PAST SURGICAL HISTORY: Past Surgical History:  Procedure Laterality Date   ABDOMINAL HYSTERECTOMY  1977   partial   INTRAMEDULLARY (IM) NAIL INTERTROCHANTERIC Right 11/28/2021   Procedure: INTRAMEDULLARY (IM) NAIL INTERTROCHANTRIC;  Surgeon: Rod Can, MD;  Location: [facility];  Service: Orthopedics;  Laterality: Right;   KIDNEY STONE SURGERY  1990's   2-3 stones x1 surgery   ORIF FEMUR FRACTURE Left 06/09/2017   Procedure: OPEN REDUCTION INTERNAL FIXATION (ORIF) DISTAL FEMUR FRACTURE;  Surgeon: Paralee Cancel, MD;  Location: [facility];  Service: Orthopedics;  Laterality: Left;   TONSILLECTOMY  as teenager   TOTAL KNEE ARTHROPLASTY Left 01/19/2014   Procedure: LEFT TOTAL KNEE ARTHROPLASTY;  Surgeon: Mauri Pole, MD;  Location: [facility];  Service: Orthopedics;  Laterality: Left;   WISDOM TOOTH EXTRACTION       FAMILY HISTORY: Family History  Problem Relation Age of Onset   Hypertension Father    Cancer Father    Cancer Mother        Bone   Cancer Brother      SOCIAL HISTORY: Social History   Socioeconomic History   Marital status: Married    Spouse name: Not on file   Number of children: Not on file   Years of education: Not on file   Highest education level: Not on file  Occupational History   Not on file  Tobacco Use   Smoking status: Former    Years: 20.00    Types: Cigarettes    Quit date: 07/03/1978    Years since quitting: 43.6   Smokeless tobacco: Former  Scientific laboratory technician Use: Never used  Substance and Sexual Activity   Alcohol use: Yes    Comment: occ   Drug use: No   Sexual activity: Yes    Birth control/protection: Surgical    Comment: hysterectomy  Other Topics Concern   Not on file  Social History Narrative   Not on file   Social Determinants of Health   Financial Resource Strain: Not on file  Food Insecurity: Not on file  Transportation Needs: Not on file  Physical Activity: Not on file  Stress: Not on file  Social Connections: Not on file  Intimate Partner Violence: Not on file     PHYSICAL EXAM  There were no vitals filed for this visit.  There is no height or weight on file to calculate BMI.  Generalized: Well developed, in no acute distress  Cardiology: normal rate and  rhythm, no murmur auscultated  Respiratory: clear to auscultation bilaterally    Neurological examination  Mentation: Alert oriented to year, place, most history taking. Follows all commands speech and language fluent Cranial nerve II-XII: Pupils were equal round reactive to light. Extraocular movements were full, visual field were full on confrontational test. Facial sensation and strength were normal. Uvula tongue midline. Head turning and shoulder shrug  were normal and symmetric. Motor: The motor testing reveals 5 over 5 strength of bilateral upper extremities, 4+/5 right lower, 3+/5 left hip flexion. Good symmetric motor tone is noted throughout.  Gait and station: Gait was not assessed, patient unaccompanied at visit and does not have walker. In wheelchair.    DIAGNOSTIC DATA (LABS, IMAGING, TESTING) - I reviewed patient records, labs, notes, testing and imaging myself where available.  Lab Results  Component Value Date   WBC 5.0 02/02/2022   HGB 10.8 (L) 02/02/2022   HCT 34.0 (L) 02/02/2022   MCV 92.1 02/02/2022   PLT 211 02/02/2022      Component Value Date/Time   NA 142 02/02/2022 1851   K 4.5 02/02/2022 1851   CL 111 02/02/2022 1851   CO2 28 02/02/2022 1851   GLUCOSE 96 02/02/2022 1851   BUN 21 02/02/2022 1851   CREATININE 1.28 (H) 02/02/2022 1851   CALCIUM 9.3 02/02/2022 1851   PROT 7.0 02/02/2022 1851   ALBUMIN 3.8 02/02/2022 1851   AST 19 02/02/2022 1851   ALT 16 02/02/2022 1851   ALKPHOS 94 02/02/2022 1851   BILITOT 0.4 02/02/2022 1851   GFRNONAA 40 (L) 02/02/2022 1851   GFRAA 41 (L) 06/11/2017 0404   No results found for: "CHOL", "HDL", "LDLCALC", "LDLDIRECT", "TRIG", "CHOLHDL" Lab Results  Component Value Date   HGBA1C 4.9 09/06/2020   Lab Results  Component Value Date   VITAMINB12 803 09/06/2020   Lab Results  Component Value Date   TSH 1.233 11/29/2021       06/07/2021    9:03 AM 03/01/2021    8:13 AM 09/06/2020    2:56 PM  MMSE - Mini Mental  State Exam  Orientation to time '3 3 4  ' Orientation to Place '5 4 4  ' Registration '3 3 3  ' Attention/ Calculation 1 1 0  Attention/Calculation-comments did WORLD    Recall '1 1 2  ' Language- name 2 objects '2 2 2  ' Language- repeat 1 1 0  Language- follow 3 step command '3 3 3  ' Language- read & follow direction '1 1 1  ' Write a sentence '1 1 1  ' Copy design 0 1 0  Total score '21 21 20         ' No data to display           ASSESSMENT AND PLAN  88 y.o. year old female  has a past medical history of ASCUS (atypical squamous cells of undetermined significance) on Pap smear, Atrophic vaginitis, Chronic insomnia, Depression, GERD (gastroesophageal reflux disease), Goiter, History of kidney stones, Hypercholesteremia, Hypertension, LGSIL (low grade squamous intraepithelial dysplasia), Osteopenia, Ovarian cyst, Pancreatitis, Scoliosis, and Spinal arthritis. here with    No diagnosis found.  Mrs Rann is doing much better. She has completely weaned Percocet and Ambien. She does continue Remeron and escitalopram. MMSE stable from previous visit, 21/30. I have been able to connect with her daughter, Nancy Suarez, via telephone. She will continue to work with PT. Fall precautions and memory compensation strategies reviewed. She will follow up closely with PCP. Avoidance of sedating medicaitons recommended. We may  consider memory agents in the future. Could consider formal neurocognitive testing, however, I do not feel it will change course of treatment. Healthy lifestyle habits encouraged. She will follow up with me in 6 months. She and her daughter verbalize understanding and agreement with this plan.    No orders of the defined types were placed in this encounter.   No orders of the defined types were placed in this encounter.    I spent 30 minutes of face-to-face and non-face-to-face time with patient.  This included previsit chart review, lab review, study review, order entry, electronic health record  documentation, patient education.    Debbora Presto, MSN, FNP-C 02/13/2022, 3:34 PM  [facility] [address] [place], [place] [address] [phone]

## 2022-02-14 ENCOUNTER — Encounter: Payer: Self-pay | Admitting: Adult Health

## 2022-02-14 ENCOUNTER — Ambulatory Visit: Payer: Medicare Other | Admitting: Adult Health

## 2022-02-14 VITALS — BP 142/66 | HR 77

## 2022-02-14 DIAGNOSIS — R4189 Other symptoms and signs involving cognitive functions and awareness: Secondary | ICD-10-CM

## 2022-02-14 DIAGNOSIS — F03918 Unspecified dementia, unspecified severity, with other behavioral disturbance: Secondary | ICD-10-CM

## 2022-02-14 MED ORDER — MIRTAZAPINE 30 MG PO TABS: 30.0000 mg | ORAL_TABLET | Freq: Every day | ORAL | 5 refills | Status: DC

## 2022-02-14 NOTE — Patient Instructions (Addendum)
Your Plan:  Increase mirtazapine to 30mg  nightly - please let me know if you have any issues tolerating  Please follow up with your PCP next week as scheduled to further discuss medications   You will be called to schedule an MRI of your brain   You will be called to schedule a visit with neuropsychology for neurocognitive evaluation       Follow up in 4 months or call earlier if needed      Thank you for coming to see at [facility]. I hope we have been able to provide you high quality care today.  You may receive a patient satisfaction survey over the next few weeks. We would appreciate your feedback and comments so that we may continue to improve ourselves and the health of our patients.

## 2022-02-15 ENCOUNTER — Telehealth: Payer: Self-pay | Admitting: Adult Health

## 2022-02-15 NOTE — Telephone Encounter (Signed)
Referral sent to [facility] [phone]. ?

## 2022-02-15 NOTE — Telephone Encounter (Signed)
[employer] medicare Berkley Harvey: [phone] (exp. 02/15/22 to 03/16/22) ordre sent to GI, they will reach out to the patient to schedule.

## 2022-02-21 DIAGNOSIS — M2550 Pain in unspecified joint: Secondary | ICD-10-CM | POA: Diagnosis not present

## 2022-02-21 DIAGNOSIS — Z6824 Body mass index (BMI) 24.0-24.9, adult: Secondary | ICD-10-CM | POA: Diagnosis not present

## 2022-02-21 DIAGNOSIS — R053 Chronic cough: Secondary | ICD-10-CM | POA: Diagnosis not present

## 2022-02-21 DIAGNOSIS — Z0001 Encounter for general adult medical examination with abnormal findings: Secondary | ICD-10-CM | POA: Diagnosis not present

## 2022-02-23 ENCOUNTER — Other Ambulatory Visit: Payer: Self-pay | Admitting: Pulmonary Disease

## 2022-02-23 ENCOUNTER — Other Ambulatory Visit (HOSPITAL_COMMUNITY): Payer: Self-pay | Admitting: Pulmonary Disease

## 2022-02-23 DIAGNOSIS — R911 Solitary pulmonary nodule: Secondary | ICD-10-CM

## 2022-02-27 DIAGNOSIS — N1832 Chronic kidney disease, stage 3b: Secondary | ICD-10-CM | POA: Diagnosis not present

## 2022-02-27 DIAGNOSIS — F32A Depression, unspecified: Secondary | ICD-10-CM | POA: Diagnosis not present

## 2022-02-27 DIAGNOSIS — E049 Nontoxic goiter, unspecified: Secondary | ICD-10-CM | POA: Diagnosis not present

## 2022-02-27 DIAGNOSIS — D509 Iron deficiency anemia, unspecified: Secondary | ICD-10-CM | POA: Diagnosis not present

## 2022-02-27 DIAGNOSIS — W19XXXD Unspecified fall, subsequent encounter: Secondary | ICD-10-CM | POA: Diagnosis not present

## 2022-02-27 DIAGNOSIS — M5136 Other intervertebral disc degeneration, lumbar region: Secondary | ICD-10-CM | POA: Diagnosis not present

## 2022-02-27 DIAGNOSIS — F5104 Psychophysiologic insomnia: Secondary | ICD-10-CM | POA: Diagnosis not present

## 2022-02-27 DIAGNOSIS — D631 Anemia in chronic kidney disease: Secondary | ICD-10-CM | POA: Diagnosis not present

## 2022-02-27 DIAGNOSIS — S72001D Fracture of unspecified part of neck of right femur, subsequent encounter for closed fracture with routine healing: Secondary | ICD-10-CM | POA: Diagnosis not present

## 2022-02-27 DIAGNOSIS — I129 Hypertensive chronic kidney disease with stage 1 through stage 4 chronic kidney disease, or unspecified chronic kidney disease: Secondary | ICD-10-CM | POA: Diagnosis not present

## 2022-02-27 DIAGNOSIS — M170 Bilateral primary osteoarthritis of knee: Secondary | ICD-10-CM | POA: Diagnosis not present

## 2022-02-27 DIAGNOSIS — R413 Other amnesia: Secondary | ICD-10-CM | POA: Diagnosis not present

## 2022-02-27 DIAGNOSIS — M8580 Other specified disorders of bone density and structure, unspecified site: Secondary | ICD-10-CM | POA: Diagnosis not present

## 2022-02-27 DIAGNOSIS — K219 Gastro-esophageal reflux disease without esophagitis: Secondary | ICD-10-CM | POA: Diagnosis not present

## 2022-02-27 DIAGNOSIS — K5900 Constipation, unspecified: Secondary | ICD-10-CM | POA: Diagnosis not present

## 2022-02-27 DIAGNOSIS — E7800 Pure hypercholesterolemia, unspecified: Secondary | ICD-10-CM | POA: Diagnosis not present

## 2022-02-28 ENCOUNTER — Observation Stay (HOSPITAL_COMMUNITY)
Admission: EM | Admit: 2022-02-28 | Discharge: 2022-03-01 | Disposition: A | Discharge status: Home Health | Payer: Medicare Other | Attending: Internal Medicine | Admitting: Internal Medicine

## 2022-02-28 ENCOUNTER — Emergency Department (HOSPITAL_COMMUNITY): Payer: Medicare Other

## 2022-02-28 ENCOUNTER — Encounter (HOSPITAL_COMMUNITY): Payer: Self-pay

## 2022-02-28 DIAGNOSIS — M47812 Spondylosis without myelopathy or radiculopathy, cervical region: Secondary | ICD-10-CM | POA: Diagnosis not present

## 2022-02-28 DIAGNOSIS — W19XXXA Unspecified fall, initial encounter: Secondary | ICD-10-CM | POA: Insufficient documentation

## 2022-02-28 DIAGNOSIS — Y92009 Unspecified place in unspecified non-institutional (private) residence as the place of occurrence of the external cause: Secondary | ICD-10-CM | POA: Insufficient documentation

## 2022-02-28 DIAGNOSIS — S0990XA Unspecified injury of head, initial encounter: Secondary | ICD-10-CM | POA: Diagnosis present

## 2022-02-28 DIAGNOSIS — M25532 Pain in left wrist: Secondary | ICD-10-CM | POA: Diagnosis not present

## 2022-02-28 DIAGNOSIS — I129 Hypertensive chronic kidney disease with stage 1 through stage 4 chronic kidney disease, or unspecified chronic kidney disease: Secondary | ICD-10-CM | POA: Diagnosis not present

## 2022-02-28 DIAGNOSIS — Z79899 Other long term (current) drug therapy: Secondary | ICD-10-CM | POA: Insufficient documentation

## 2022-02-28 DIAGNOSIS — D509 Iron deficiency anemia, unspecified: Secondary | ICD-10-CM | POA: Diagnosis not present

## 2022-02-28 DIAGNOSIS — M852 Hyperostosis of skull: Secondary | ICD-10-CM | POA: Diagnosis not present

## 2022-02-28 DIAGNOSIS — Z96652 Presence of left artificial knee joint: Secondary | ICD-10-CM | POA: Diagnosis not present

## 2022-02-28 DIAGNOSIS — W01118A Fall on same level from slipping, tripping and stumbling with subsequent striking against other sharp object, initial encounter: Secondary | ICD-10-CM | POA: Diagnosis not present

## 2022-02-28 DIAGNOSIS — G9340 Encephalopathy, unspecified: Secondary | ICD-10-CM | POA: Insufficient documentation

## 2022-02-28 DIAGNOSIS — S066X0A Traumatic subarachnoid hemorrhage without loss of consciousness, initial encounter: Secondary | ICD-10-CM | POA: Diagnosis not present

## 2022-02-28 DIAGNOSIS — I609 Nontraumatic subarachnoid hemorrhage, unspecified: Secondary | ICD-10-CM

## 2022-02-28 DIAGNOSIS — T07XXXA Unspecified multiple injuries, initial encounter: Secondary | ICD-10-CM

## 2022-02-28 DIAGNOSIS — F0390 Unspecified dementia without behavioral disturbance: Secondary | ICD-10-CM | POA: Insufficient documentation

## 2022-02-28 DIAGNOSIS — F32A Depression, unspecified: Secondary | ICD-10-CM | POA: Diagnosis present

## 2022-02-28 DIAGNOSIS — F339 Major depressive disorder, recurrent, unspecified: Secondary | ICD-10-CM

## 2022-02-28 DIAGNOSIS — J309 Allergic rhinitis, unspecified: Secondary | ICD-10-CM | POA: Diagnosis present

## 2022-02-28 DIAGNOSIS — Z87891 Personal history of nicotine dependence: Secondary | ICD-10-CM | POA: Diagnosis not present

## 2022-02-28 DIAGNOSIS — I10 Essential (primary) hypertension: Secondary | ICD-10-CM | POA: Diagnosis present

## 2022-02-28 DIAGNOSIS — N1832 Chronic kidney disease, stage 3b: Secondary | ICD-10-CM | POA: Insufficient documentation

## 2022-02-28 DIAGNOSIS — K219 Gastro-esophageal reflux disease without esophagitis: Secondary | ICD-10-CM | POA: Diagnosis present

## 2022-02-28 DIAGNOSIS — M25512 Pain in left shoulder: Secondary | ICD-10-CM | POA: Diagnosis not present

## 2022-02-28 DIAGNOSIS — S0003XA Contusion of scalp, initial encounter: Secondary | ICD-10-CM | POA: Diagnosis not present

## 2022-02-28 DIAGNOSIS — E785 Hyperlipidemia, unspecified: Secondary | ICD-10-CM | POA: Diagnosis present

## 2022-02-28 HISTORY — DX: Allergic rhinitis, unspecified: J30.9

## 2022-02-28 LAB — CK: Total CK: 43 U/L (ref 38–234)

## 2022-02-28 LAB — CBC WITH DIFFERENTIAL/PLATELET
Abs Immature Granulocytes: 0.03 10*3/uL (ref 0.00–0.07)
Basophils Absolute: 0 10*3/uL (ref 0.0–0.1)
Basophils Relative: 1 %
Eosinophils Absolute: 0 10*3/uL (ref 0.0–0.5)
Eosinophils Relative: 0 %
HCT: 32.6 % — ABNORMAL LOW (ref 36.0–46.0)
Hemoglobin: 10.4 g/dL — ABNORMAL LOW (ref 12.0–15.0)
Immature Granulocytes: 1 %
Lymphocytes Relative: 15 %
Lymphs Abs: 0.8 10*3/uL (ref 0.7–4.0)
MCH: 29.1 pg (ref 26.0–34.0)
MCHC: 31.9 g/dL (ref 30.0–36.0)
MCV: 91.3 fL (ref 80.0–100.0)
Monocytes Absolute: 0.5 10*3/uL (ref 0.1–1.0)
Monocytes Relative: 9 %
Neutro Abs: 4.3 10*3/uL (ref 1.7–7.7)
Neutrophils Relative %: 74 %
Platelets: 196 10*3/uL (ref 150–400)
RBC: 3.57 MIL/uL — ABNORMAL LOW (ref 3.87–5.11)
RDW: 14.2 % (ref 11.5–15.5)
WBC: 5.7 10*3/uL (ref 4.0–10.5)
nRBC: 0 % (ref 0.0–0.2)

## 2022-02-28 LAB — COMPREHENSIVE METABOLIC PANEL
ALT: 13 U/L (ref 0–44)
AST: 18 U/L (ref 15–41)
Albumin: 3.2 g/dL — ABNORMAL LOW (ref 3.5–5.0)
Alkaline Phosphatase: 75 U/L (ref 38–126)
Anion gap: 9 (ref 5–15)
BUN: 19 mg/dL (ref 8–23)
CO2: 22 mmol/L (ref 22–32)
Calcium: 9.4 mg/dL (ref 8.9–10.3)
Chloride: 110 mmol/L (ref 98–111)
Creatinine, Ser: 1.39 mg/dL — ABNORMAL HIGH (ref 0.44–1.00)
GFR, Estimated: 36 mL/min — ABNORMAL LOW (ref >60)
Glucose, Bld: 97 mg/dL (ref 70–99)
Potassium: 4.3 mmol/L (ref 3.5–5.1)
Sodium: 141 mmol/L (ref 135–145)
Total Bilirubin: 0.9 mg/dL (ref 0.3–1.2)
Total Protein: 5.8 g/dL — ABNORMAL LOW (ref 6.5–8.1)

## 2022-02-28 LAB — MAGNESIUM: Magnesium: 1.9 mg/dL (ref 1.7–2.4)

## 2022-02-28 LAB — TSH: TSH: 0.881 u[IU]/mL (ref 0.350–4.500)

## 2022-02-28 MED ORDER — MIRTAZAPINE 15 MG PO TABS
30.0000 mg | ORAL_TABLET | Freq: Once | ORAL | Status: AC
  Administered 2022-02-28: 30 mg via ORAL
  Filled 2022-02-28: qty 2

## 2022-02-28 MED ORDER — AMLODIPINE BESYLATE 2.5 MG PO TABS
2.5000 mg | ORAL_TABLET | Freq: Every day | ORAL | Status: DC
  Administered 2022-02-28 – 2022-03-01 (×2): 2.5 mg via ORAL
  Filled 2022-02-28 (×2): qty 1

## 2022-02-28 MED ORDER — FAMOTIDINE 20 MG PO TABS
20.0000 mg | ORAL_TABLET | Freq: Every day | ORAL | Status: DC
  Administered 2022-02-28: 20 mg via ORAL
  Filled 2022-02-28: qty 1

## 2022-02-28 MED ORDER — HALOPERIDOL LACTATE 5 MG/ML IJ SOLN
1.0000 mg | Freq: Once | INTRAMUSCULAR | Status: AC
  Administered 2022-02-28: 1 mg via INTRAVENOUS
  Filled 2022-02-28: qty 1

## 2022-02-28 MED ORDER — ACETAMINOPHEN 650 MG RE SUPP: 650.0000 mg | Freq: Four times a day (QID) | RECTAL | Status: DC | PRN

## 2022-02-28 MED ORDER — ATORVASTATIN CALCIUM 10 MG PO TABS
20.0000 mg | ORAL_TABLET | Freq: Every day | ORAL | Status: DC
  Administered 2022-03-01: 20 mg via ORAL
  Filled 2022-02-28: qty 2

## 2022-02-28 MED ORDER — ACETAMINOPHEN 500 MG PO TABS
1000.0000 mg | ORAL_TABLET | Freq: Once | ORAL | Status: AC
Start: 2022-02-28 — End: 2022-02-28
  Administered 2022-02-28: 1000 mg via ORAL
  Filled 2022-02-28: qty 2

## 2022-02-28 MED ORDER — MELATONIN 5 MG PO TABS
10.0000 mg | ORAL_TABLET | Freq: Every evening | ORAL | Status: DC | PRN
  Administered 2022-02-28: 10 mg via ORAL
  Filled 2022-02-28: qty 2

## 2022-02-28 MED ORDER — FENTANYL CITRATE PF 50 MCG/ML IJ SOSY
50.0000 ug | PREFILLED_SYRINGE | Freq: Once | INTRAMUSCULAR | Status: DC
  Filled 2022-02-28: qty 1

## 2022-02-28 MED ORDER — ACETAMINOPHEN 325 MG PO TABS
650.0000 mg | ORAL_TABLET | Freq: Once | ORAL | Status: AC
  Administered 2022-02-28: 650 mg via ORAL
  Filled 2022-02-28: qty 2

## 2022-02-28 MED ORDER — ESCITALOPRAM OXALATE 10 MG PO TABS
10.0000 mg | ORAL_TABLET | Freq: Every day | ORAL | Status: DC
  Administered 2022-03-01: 10 mg via ORAL
  Filled 2022-02-28: qty 1

## 2022-02-28 MED ORDER — ACETAMINOPHEN 325 MG PO TABS: 650.0000 mg | ORAL_TABLET | Freq: Four times a day (QID) | ORAL | Status: DC | PRN

## 2022-02-28 MED ORDER — FERROUS SULFATE 325 (65 FE) MG PO TABS
325.0000 mg | ORAL_TABLET | Freq: Every day | ORAL | Status: DC
  Administered 2022-03-01: 325 mg via ORAL
  Filled 2022-02-28: qty 1

## 2022-02-28 NOTE — H&P (Signed)
History and Physical    PLEASE NOTE THAT DRAGON DICTATION SOFTWARE WAS USED IN THE CONSTRUCTION OF THIS NOTE.   Nancy Suarez QMV:[id number] DOB: [birth year] DOA: 02/28/2022  PCP: Corine Shelter, MD  Patient coming from: home   I have personally briefly reviewed patient's old medical records in [facility]  Chief Complaint: fall  HPI: Nancy Suarez is a 88 y.o. female with medical history significant for dementia, essential hypertension, hyperlipidemia, chronic iron deficiency anemia associated with baseline hemoglobin 7.5-10, who is admitted to [facility] on 02/28/2022 with acute encephalopathy after presenting from home to [facility] ED for evaluation of fall.  In the setting of the patient's altered mental status, the following history is provided by the patient's daughter in addition to my discussions with the EDP and via chart review.  Patient presents after a reported ground-level mechanical fall at home in which she tripped while attempting to ambulate, resulting in her falling to her left side, striking her left portion of her head as a component of this fall.  No reported associated loss of consciousness.  Not on any blood thinners.  Patient reports a mild left-sided headache, but otherwise is without acute complaint at this time.    Daughter conveys that the patient has a history of dementia intermittently sundown's at home.  Daughter conveys that the patient is currently confused relative to her daytime baseline mental status and first noted this confusion in the emergency department today, but conveys that the current degree and nature of the patient's confusion is similar to that which the patient is experienced at times of previous sundowning episodes.  Daughter confirms that leading up to today's ground-level chemical fall fall, the patient was without any recent acute complaints.  No report of recent fever/chills.  Per chart review, she has a history of chronic iron  deficiency anemia associated with baseline hemoglobin range 7.5-10, and is on daily oral iron supplement for this.    ED Course:  Vital signs in the ED were notable for the following: Afebrile; heart rate 74-92; systolic blood pressures initially noted to be in the 170s, subsequently into the 120s to 150s mmHg; respiratory rate 15-20, oxygen saturation 96 to 100% on room air.  Labs were notable for the following: CMP notable for the following: Sodium 141, creatinine 1.39 compared to most recent prior value of 1.28 on 02/03/2022, glucose 97, calcium, adjusted for mild hypoalbuminemia, noted to be 10.0, albumin 3.2, otherwise liver enzymes found to be within normal limits.  CBC notable for white blood cell count 5700, hemoglobin 10.7 associated normocytic/normochromic properties as well as nonelevated RDW, platelet count 196.  Imaging and additional notable ED work-up: CT head, in comparison to CT head from October 2022, shows interval development of a 1 cm subarachnoid hemorrhage in the left frontal cortex, without intraventricular blood, and no evidence of epidural or subdural fluid collection.  CT head also notes large subcutaneous hematoma in the left frontal scalp and left periorbital region, without evidence of fracture in the calvarium.  CT maxillofacial shows no evidence of acute fracture.  CT cervical spine shows no evidence of acute cervical spine fracture.  Plain films of the left wrist showed no acute fracture while plain films of the left shoulder also showed no evidence of acute fracture.  EDP discussed the patient's case with the on-call neurosurgeon, Dr. Johnsie Cancel, Who felt that there was no need for neurosurgical intervention, and did not recommend any scheduled repeat CT head, nor any specific  target blood pressure parameters, nor the need for empiric follow-up in neurosurgery clinic.  While in the ED, the following were administered: Acetaminophen 650 mg p.o. x1, acetaminophen 1 g p.o.  x1, Haldol 1 mg IV x1.  Subsequently, the patient was admitted for overnight observation for further evaluation management of acute encephalopathy after presenting with ground-level mechanical fall, with ensuing imaging revealing evidence of new 1 cm subarachnoid hemorrhage.    Review of Systems: As per HPI otherwise 10 point review of systems negative.   Past Medical History:  Diagnosis Date   Allergic rhinitis    ASCUS (atypical squamous cells of undetermined significance) on Pap smear    Atrophic vaginitis    Chronic insomnia    Depression    GERD (gastroesophageal reflux disease)    Goiter    History of kidney stones    Hypercholesteremia    Hypertension    LGSIL (low grade squamous intraepithelial dysplasia)    Osteopenia    Ovarian cyst    "shrank it"   Pancreatitis    Scoliosis    Spinal arthritis     Past Surgical History:  Procedure Laterality Date   ABDOMINAL HYSTERECTOMY  1977   partial   INTRAMEDULLARY (IM) NAIL INTERTROCHANTERIC Right 11/28/2021   Procedure: INTRAMEDULLARY (IM) NAIL INTERTROCHANTRIC;  Surgeon: Samson Frederic, MD;  Location: [facility]S;  Service: Orthopedics;  Laterality: Right;   KIDNEY STONE SURGERY  1990's   2-3 stones x1 surgery   ORIF FEMUR FRACTURE Left 06/09/2017   Procedure: OPEN REDUCTION INTERNAL FIXATION (ORIF) DISTAL FEMUR FRACTURE;  Surgeon: Durene Romans, MD;  Location: [facility];  Service: Orthopedics;  Laterality: Left;   TONSILLECTOMY  as teenager   TOTAL KNEE ARTHROPLASTY Left 01/19/2014   Procedure: LEFT TOTAL KNEE ARTHROPLASTY;  Surgeon: Shelda Pal, MD;  Location: [facility];  Service: Orthopedics;  Laterality: Left;   WISDOM TOOTH EXTRACTION      Social History:  reports that she quit smoking about 43 years ago. Her smoking use included cigarettes. She has quit using smokeless tobacco. She reports current alcohol use. She reports that she does not use drugs.   Allergies  Allergen Reactions   Ambien [Zolpidem] Other (See  Comments)    Disoriented, aggressive   Aspirin     Upsets stomach if 325mg    Codeine Nausea And Vomiting   Namenda [Memantine]     Agitation, aggression   Percocet [Oxycodone-Acetaminophen] Other (See Comments)    Disoriented, aggressive   Sulfa Antibiotics Nausea Only    Family History  Problem Relation Age of Onset   Hypertension Father    Cancer Father    Cancer Mother        Bone   Cancer Brother     Family history reviewed and not pertinent    Prior to Admission medications   Medication Sig Start Date End Date Taking? Authorizing Provider  acetaminophen (TYLENOL) 500 MG tablet Take 2 tablets (1,000 mg total) by mouth 2 (two) times daily. 12/05/21   02/04/22, MD  amLODipine (NORVASC) 2.5 MG tablet Take 1 tablet (2.5 mg total) by mouth daily. 12/05/21 02/14/22  02/16/22, MD  atorvastatin (LIPITOR) 20 MG tablet Take 20 mg by mouth daily. 09/13/21   [provider]  carboxymethylcellulose (REFRESH PLUS) 0.5 % SOLN Place 1 drop into both eyes in the morning, at noon, and at bedtime.    [provider]  escitalopram (LEXAPRO) 10 MG tablet Take 10 mg by mouth daily.  [provider]  famotidine (PEPCID) 20 MG tablet Take 20 mg by mouth 2 (two) times daily.    [provider]  ferrous sulfate 325 (65 FE) MG tablet Take 1 tablet (325 mg total) by mouth daily with breakfast. 12/06/21 02/14/22  Donne Hazel, MD  Fexofenadine HCl [facility] ALLERGY PO) Take 1 tablet by mouth daily.    [provider]  Melatonin 10 MG TABS Take 10-20 mg by mouth at bedtime.    [provider]  mirtazapine (REMERON) 30 MG tablet Take 1 tablet (30 mg total) by mouth at bedtime. 02/14/22   Frann Rider, NP  Multiple Vitamin (MULTIVITAMIN WITH MINERALS) TABS tablet Take 1 tablet by mouth daily.    [provider]  polyethylene glycol (MIRALAX / GLYCOLAX) 17 g packet Take 17 g by mouth daily. 12/06/21   Donne Hazel, MD  senna (SENOKOT)  8.6 MG TABS tablet Take 1 tablet (8.6 mg total) by mouth 2 (two) times daily. 12/05/21   Donne Hazel, MD     Objective    Physical Exam: Vitals:   02/28/22 1930 02/28/22 2000 02/28/22 2100 02/28/22 2125  BP: (!) 124/106 (!) 150/77 (!) 147/82   Pulse: 88 81 86   Resp: 18 17 18    Temp:    98.4 F (36.9 C)  TempSrc:    Oral  SpO2: 97% 97% 100%   Weight:      Height:        General: appears to be stated age; alert, confused Skin: warm, dry, no rash Head:  AT/[place] Mouth:  Oral mucosa membranes appear moist, normal dentition Neck: supple; trachea midline Heart:  RRR; did not appreciate any M/R/G Lungs: CTAB, did not appreciate any wheezes, rales, or rhonchi Abdomen: + BS; soft, ND, NT Vascular: 2+ pedal pulses b/l; 2+ radial pulses b/l Extremities: no peripheral edema, no muscle wasting Neuro: In the setting of the patient's current mental status and associated inability to follow instructions, unable to perform full neurologic exam at this time.  As such, assessment of strength, sensation, and cranial nerves is limited at this time. Patient noted to spontaneously move all 4 extremities. No tremors.      Labs on Admission: I have personally reviewed following labs and imaging studies  CBC: Recent Labs  Lab 02/28/22 1913  WBC 5.7  NEUTROABS 4.3  HGB 10.4*  HCT 32.6*  MCV 91.3  PLT 123456   Basic Metabolic Panel: Recent Labs  Lab 02/28/22 1913  NA 141  K 4.3  CL 110  CO2 22  GLUCOSE 97  BUN 19  CREATININE 1.39*  CALCIUM 9.4   GFR: Estimated Creatinine Clearance: 27.5 mL/min (A) (by C-G formula based on SCr of 1.39 mg/dL (H)). Liver Function Tests: Recent Labs  Lab 02/28/22 1913  AST 18  ALT 13  ALKPHOS 75  BILITOT 0.9  PROT 5.8*  ALBUMIN 3.2*   No results for input(s): "LIPASE", "AMYLASE" in the last 168 hours. No results for input(s): "AMMONIA" in the last 168 hours. Coagulation Profile: No results for input(s): "INR", "PROTIME" in the last 168  hours. Cardiac Enzymes: No results for input(s): "CKTOTAL", "CKMB", "CKMBINDEX", "TROPONINI" in the last 168 hours. BNP (last 3 results) No results for input(s): "PROBNP" in the last 8760 hours. HbA1C: No results for input(s): "HGBA1C" in the last 72 hours. CBG: No results for input(s): "GLUCAP" in the last 168 hours. Lipid Profile: No results for input(s): "CHOL", "HDL", "LDLCALC", "TRIG", "CHOLHDL", "LDLDIRECT" in the  last 72 hours. Thyroid Function Tests: No results for input(s): "TSH", "T4TOTAL", "FREET4", "T3FREE", "THYROIDAB" in the last 72 hours. Anemia Panel: No results for input(s): "VITAMINB12", "FOLATE", "FERRITIN", "TIBC", "IRON", "RETICCTPCT" in the last 72 hours. Urine analysis:    Component Value Date/Time   COLORURINE YELLOW 02/02/2022 1733   APPEARANCEUR CLEAR 02/02/2022 1733   LABSPEC 1.025 02/02/2022 1733   PHURINE 5.5 02/02/2022 1733   GLUCOSEU NEGATIVE 02/02/2022 1733   HGBUR NEGATIVE 02/02/2022 1733   BILIRUBINUR NEGATIVE 02/02/2022 1733   KETONESUR NEGATIVE 02/02/2022 1733   PROTEINUR NEGATIVE 02/02/2022 1733   UROBILINOGEN 0.2 01/12/2014 1537   NITRITE NEGATIVE 02/02/2022 1733   LEUKOCYTESUR NEGATIVE 02/02/2022 1733    Radiological Exams on Admission: DG Shoulder Left  Result Date: 02/28/2022 CLINICAL DATA:  Fall, left shoulder pain EXAM: LEFT SHOULDER - 2+ VIEW COMPARISON:  None Available. FINDINGS: There is no evidence of acute fracture. There is severe glenohumeral osteoarthritis with bulky osteophyte formation and bony remodeling. Subacromial spurring and greater tuberosity irregularity. IMPRESSION: No evidence of acute fracture. Severe glenohumeral osteoarthritis and findings of chronic distal rotator cuff disease. Electronically Signed   By: Maurine Simmering M.D.   On: 02/28/2022 15:32   DG Wrist Complete Left  Result Date: 02/28/2022 CLINICAL DATA:  Fall today. Posterior left wrist pain. History of left wrist fracture 3-4 years ago. EXAM: LEFT WRIST -  COMPLETE 3+ VIEW COMPARISON:  Left wrist radiographs 06/17/2014 FINDINGS: Interval healing of the prior displaced distal radial fracture with unchanged mild lateral displacement of the distal fracture component. There also appears to be minimal volar displacement of the distal fracture component compared to the mild dorsal displacement seen at time of the acute fracture previously. There is new 4 mm ulnar positive variance. Chronic nonunited ulnar styloid fracture. The scapholunate interval measures approximately 4 mm, increased from 2.5 mm previously and suggesting scapholunate ligament insufficiency. Severe thumb carpometacarpal and moderate triscaphe joint space narrowing. Moderate to high-grade thumb carpometacarpal peripheral osteophytosis. IMPRESSION: 1. Interval healing of mildly displaced distal radial fracture. 2. Chronic nonunited ulnar styloid fracture. 3. Severe thumb carpometacarpal and moderate triscaphe osteoarthritis. 4. Widening of the scapholunate interval is increased from prior, suggesting a chronic full-thickness given at ligament tear. 5. No acute fracture is seen. Electronically Signed   By: Yvonne Kendall M.D.   On: 02/28/2022 15:11   CT Maxillofacial WO CM  Result Date: 02/28/2022 CLINICAL DATA:  Trauma EXAM: CT MAXILLOFACIAL WITHOUT CONTRAST TECHNIQUE: Multidetector CT imaging of the maxillofacial structures was performed. Multiplanar CT image reconstructions were also generated. RADIATION DOSE REDUCTION: This exam was performed according to the departmental dose-optimization program which includes automated exposure control, adjustment of the mA and/or kV according to patient size and/or use of iterative reconstruction technique. COMPARISON:  None Available. FINDINGS: Osseous: No recent fracture is seen. Orbits: Optic globes are symmetrical. Retrobulbar soft tissues are unremarkable. Small calcifications seen in the right anterior aspect of left optic globe may be residual from previous  intervention. Sinuses: There is mucosal thickening in left maxillary sinus. There are no air-fluid levels in the sinuses. Soft tissues: There is large subcutaneous hematoma in the left periorbital region and left frontal scalp. Limited intracranial: There is a small focus of subarachnoid hemorrhage in the left frontal cortex which was better evaluated in the CT brain done today. IMPRESSION: No recent fracture is seen. There are no air-fluid levels in paranasal sinuses. Optic globes are symmetrical. Retrobulbar soft tissues are unremarkable. There is large subcutaneous hematoma in the left  periorbital region and left parietal scalp. No fracture is seen in adjacent bony calvarium. Electronically Signed   By: Elmer Picker M.D.   On: 02/28/2022 14:08   CT Cervical Spine Wo Contrast  Result Date: 02/28/2022 CLINICAL DATA:  Trauma EXAM: CT CERVICAL SPINE WITHOUT CONTRAST TECHNIQUE: Multidetector CT imaging of the cervical spine was performed without intravenous contrast. Multiplanar CT image reconstructions were also generated. RADIATION DOSE REDUCTION: This exam was performed according to the departmental dose-optimization program which includes automated exposure control, adjustment of the mA and/or kV according to patient size and/or use of iterative reconstruction technique. COMPARISON:  None Available. FINDINGS: Motion artifacts limit evaluation. Alignment: Alignment of posterior margins of portable bodies is unremarkable. Skull base and vertebrae: No recent fracture is seen. Soft tissues and spinal canal: There is no significant central spinal stenosis. Posterior bony spurs are causing extrinsic pressure over the ventral margin of thecal sac from C3-C7 levels. Disc levels: There is encroachment of neural foramina from C2-C7 levels. Upper chest: Unremarkable. Other: Thyroid is enlarged. There is a 2.2 cm low-density nodule in the left lobe of thyroid. IMPRESSION: Motion limited study. No recent fracture is  seen. Cervical spondylosis with encroachment of neural foramina from C2-C7 levels. There is inhomogeneous attenuation in enlarged thyroid. There is 2.2 cm nodule in the left lobe of thyroid. Recommend nonemergent thyroid US if clinically warranted given patient age.Reference: J Am Coll Radiol. 2015 Feb;12(2): 143-50 Electronically Signed   By: Elmer Picker M.D.   On: 02/28/2022 14:04   CT Head Wo Contrast  Result Date: 02/28/2022 CLINICAL DATA:  Trauma EXAM: CT HEAD WITHOUT CONTRAST TECHNIQUE: Contiguous axial images were obtained from the base of the skull through the vertex without intravenous contrast. RADIATION DOSE REDUCTION: This exam was performed according to the departmental dose-optimization program which includes automated exposure control, adjustment of the mA and/or kV according to patient size and/or use of iterative reconstruction technique. COMPARISON:  04/13/2021 FINDINGS: Brain: There is interval appearance of 10 mm focus of subarachnoid hemorrhage in the left frontal cortex. Possibility of minimal petechial hemorrhage in the adjacent brain is not excluded. There is no demonstrable intraventricular blood. There are no epidural or subdural fluid collections. Cortical sulci are prominent. Vascular: Unremarkable. Skull: No fracture is seen in calvarium. Hyperostosis frontalis interna is seen. There is large hematoma in subcutaneous plane in the left frontal scalp and left periorbital region. Sinuses/Orbits: There are no air-fluid levels in paranasal sinuses. Other: None. IMPRESSION: There is new 10 mm focus of subarachnoid hemorrhage in the left frontal cortex. There are no epidural or subdural fluid collections. There is large subcutaneous hematoma in the left frontal scalp and left periorbital region. No fracture is seen in calvarium. Imaging findings are related to patient's provider Dr. Lacretia Leigh by telephone call at 1:58 p.m. on 02/28/2022. Electronically Signed   By: Elmer Picker M.D.   On: 02/28/2022 13:59     Assessment/Plan   Principal Problem:   Acute encephalopathy Active Problems:   GERD (gastroesophageal reflux disease)   Essential hypertension   HLD (hyperlipidemia)   Depression   Chronic iron deficiency anemia   Fall at home, initial encounter   Subarachnoid hemorrhage ([place])   Allergic rhinitis      #) Acute encephalopathy: Confusion noted in the emergency department, per daughter, was not present earlier in the day.  Underlying attempt G not entirely clear, as it also conveys that the patient is a history of dementia associated with sundowning, that is  presented in similar fashion the past.   differential appears to favor delirium in the setting of sundowning.  However, given concomitant presenting new subarachnoid hemorrhage, will observe overnight to help further differentiate between these 2 potential contributing possibilities.  No evidence of contributing metabolic factors, including no overt evidence of underlying infection, but will further expand metabolic/infectious work-up as further detailed below.  Plan: maintain n.p.o. until passing RN swallow screen.  Delirium precautions ordered.  Every 4 hours neurochecks.  Fall precautions.  Check CPK level, TSH, urinalysis.  We will also check VBG to evaluate for any contribution from hypercapnic encephalopathy given the patient's greater than 20-pack-year history of smoking.  Repeat CMP and CBC in the morning.  Hold outpatient Allegra to eliminate any anticholinergic contribution towards her current confusion.         #) Acute subarachnoid hemorrhage: In the setting of ground-level mechanical fall earlier today in which she hit the left portion of her head without associated loss of consciousness, which patient in the left portion of her head, CT head shows interval development of 1 cm subarachnoid hemorrhage in the left frontal cortex, without any intraventricular blood or any evidence  of epidural/subdural fluid collection.  Not on any blood thinners as an outpatient.  Appears hemodynamically stable, including no evidence of bradycardia or worsening hypertension to suggest increasing intracranial pressure.  No overt evidence of acute focal neurologic deficits, although with the caveat of inability to complete full neurologic assessment at this time the setting of the patient's altered mental status.  EDP discussed the patient's case with the on-call neurosurgeon, Dr. Venetia Constable, Who felt that there was no need for neurosurgical intervention, and did not recommend any scheduled repeat CT head, nor any specific target blood pressure parameters, nor the need for empiric follow-up in neurosurgery clinic.   Plan: Every 4 hours neurochecks.  Monitor on symmetry.  Refraining from pharmacologic DVT prophylaxis.  SCDs.  Repeat CBC in the morning.  Fall precautions ordered.  Prn acetaminophen.  Add on INR/PTT.           #) Ground-level mechanical fall: Without loss of consciousness, as further detailed above, occurring earlier today, resulting in aforementioned new subarachnoid hemorrhage, as above.  Appears to be purely mechanical in nature, but will further evaluate for any potential metabolic/infectious factors that may previous posterior to incurring this fall, as further detailed below.  Plan: Check urinalysis.  Repeat CMP/CBC in the morning.  Overaction surgery.            #) Depression: Documented history of such, on x-rays and outpatient.  Plan: Continue Lexapro.            #) GERD: documented h/o such; on Pepcid as outpatient.   Plan: continue home H2 blocker.             #) Essential Hypertension: documented h/o such, with outpatient antihypertensive regimen including amlodipine.  SBP's in the ED today: None range in the 120s to 150s following initial systolic blood pressure in the 170s mmHg. appears to be a reassuring trend in her blood  pressure in the context of presenting subarachnoid hemorrhage in terms of evaluating for evidence of increased intracranial pressure.  Plan: Close monitoring of subsequent BP via routine VS. monitor on symmetry.  Resume home amlodipine.  Further evaluation management subarachnoid hemorrhage, as above.           #) Hyperlipidemia: documented h/o such. On atorvastatin as outpatient.    Plan: continue home statin.             #)  Chronic iron deficiency anemia documented history of such, associated solumedrol range 7.5-10, present hemoglobin consistent with this range.  On daily oral iron supplementation at home.  Plan: Repeat CBC in the morning.  Continue outpatient oral iron supplementation.  In the setting of subarachnoid hemorrhage, also add on INR/PTT.         #) Allergic rhinitis: Documented history of such, on scheduled Allegra as an outpatient, in the absence of any use of intranasal corticosteroids.  In the context of presenting confusion, will hold home Allegra for now, delaminate a potential contribution from an anticholinergic standpoint that may be posed by this medication.  Plan: [place] for now, as above.        DVT prophylaxis: SCD's   Code Status: Full code Disposition Plan: Per Rounding Team Consults called: EDP discussed patient's case with the on-call neurosurgeon, Dr.Ostergaard, As further detailed above;  Admission status: Observation    PLEASE NOTE THAT DRAGON DICTATION SOFTWARE WAS USED IN THE CONSTRUCTION OF THIS NOTE.   [place] DO [facility]  From [place]   02/28/2022, 10:46 PM

## 2022-02-28 NOTE — ED Provider Notes (Signed)
[facility] EMERGENCY DEPARTMENT Provider Note   CSN: [id number] Arrival date & time: 02/28/22  1250     History  Chief Complaint  Patient presents with   Facial Injury   Head Injury   Fall    GEORGIANN NEIDER is a 88 y.o. female.  88 year old female presents after mechanical fall just prior to arrival.  Patient states he tripped on the floor and fell and struck the left side of her face.  No loss of consciousness.  Complains of some left paraspinal muscle pain as well as swelling to the left orbit.  Denies any diplopia.  No pain in her chest, back, pelvis.  Transported here for further evaluation       Home Medications Prior to Admission medications   Medication Sig Start Date End Date Taking? Authorizing Provider  acetaminophen (TYLENOL) 500 MG tablet Take 2 tablets (1,000 mg total) by mouth 2 (two) times daily. 12/05/21   Jerald Kief, MD  amLODipine (NORVASC) 2.5 MG tablet Take 1 tablet (2.5 mg total) by mouth daily. 12/05/21 02/14/22  Jerald Kief, MD  atorvastatin (LIPITOR) 20 MG tablet Take 20 mg by mouth daily. 09/13/21   [provider]  carboxymethylcellulose (REFRESH PLUS) 0.5 % SOLN Place 1 drop into both eyes in the morning, at noon, and at bedtime.    [provider]  escitalopram (LEXAPRO) 10 MG tablet Take 10 mg by mouth daily.    [provider]  famotidine (PEPCID) 20 MG tablet Take 20 mg by mouth 2 (two) times daily.    [provider]  ferrous sulfate 325 (65 FE) MG tablet Take 1 tablet (325 mg total) by mouth daily with breakfast. 12/06/21 02/14/22  Jerald Kief, MD  Fexofenadine HCl [facility] ALLERGY PO) Take 1 tablet by mouth daily.    [provider]  Melatonin 10 MG TABS Take 10-20 mg by mouth at bedtime.    [provider]  mirtazapine (REMERON) 30 MG tablet Take 1 tablet (30 mg total) by mouth at bedtime. 02/14/22   Ihor Austin, NP  Multiple Vitamin (MULTIVITAMIN WITH MINERALS) TABS  tablet Take 1 tablet by mouth daily.    [provider]  polyethylene glycol (MIRALAX / GLYCOLAX) 17 g packet Take 17 g by mouth daily. 12/06/21   Jerald Kief, MD  senna (SENOKOT) 8.6 MG TABS tablet Take 1 tablet (8.6 mg total) by mouth 2 (two) times daily. 12/05/21   Jerald Kief, MD      Allergies    Ambien [zolpidem], Aspirin, Codeine, Namenda [memantine], Percocet [oxycodone-acetaminophen], and Sulfa antibiotics    Review of Systems   Review of Systems  All other systems reviewed and are negative.   Physical Exam Updated Vital Signs BP (!) 169/94   Pulse 88   Temp 98.4 F (36.9 C) (Oral)   Resp 19   Ht 1.651 m (5\' 5" )   Wt 70.3 kg   SpO2 98%   BMI 25.79 kg/m  Physical Exam Vitals and nursing note reviewed.  Constitutional:      General: She is not in acute distress.    Appearance: Normal appearance. She is well-developed. She is not toxic-appearing.  HENT:     Head: Normocephalic and atraumatic.   Eyes:     General: Lids are normal.     Conjunctiva/sclera: Conjunctivae normal.     Pupils: Pupils are equal, round, and reactive to light.     Comments: No evidence of extraocular muscle  entrapment.  Globe is intact.  No hyphema noted.  No diplopia  Neck:     Thyroid: No thyroid mass.     Trachea: No tracheal deviation.  Cardiovascular:     Rate and Rhythm: Normal rate and regular rhythm.     Heart sounds: Normal heart sounds. No murmur heard.    No gallop.  Pulmonary:     Effort: Pulmonary effort is normal. No respiratory distress.     Breath sounds: Normal breath sounds. No stridor. No decreased breath sounds, wheezing, rhonchi or rales.  Abdominal:     General: There is no distension.     Palpations: Abdomen is soft.     Tenderness: There is no abdominal tenderness. There is no rebound.  Musculoskeletal:        General: No tenderness. Normal range of motion.     Cervical back: Normal range of motion and neck supple.  Skin:    General: Skin is warm  and dry.     Findings: No abrasion or rash.  Neurological:     General: No focal deficit present.     Mental Status: She is alert and oriented to person, place, and time. Mental status is at baseline.     GCS: GCS eye subscore is 4. GCS verbal subscore is 5. GCS motor subscore is 6.     Cranial Nerves: No cranial nerve deficit.     Sensory: No sensory deficit.     Motor: Motor function is intact.  Psychiatric:        Attention and Perception: Attention normal.        Speech: Speech normal.        Behavior: Behavior normal.     ED Results / Procedures / Treatments   Labs (all labs ordered are listed, but only abnormal results are displayed) Labs Reviewed - No data to display  EKG None  Radiology No results found.  Procedures Procedures    Medications Ordered in ED Medications - No data to display  ED Course/ Medical Decision Making/ A&P                           Medical Decision Making Amount and/or Complexity of Data Reviewed Radiology: ordered.  Risk OTC drugs.   CT of brain and maxillofacial shows 2 mm subarachnoid hemorrhage in the left frontal cortex my interpretation.  She has some soft tissue edema but she has no evidence of fracture.  She is neurologic intact at this time.  X-ray of wrist was negative per my interpretation on the left for new fracture.  Discussed neurological features with Dr. Johnsie Cancel who is on-call for neurosurgery who states that patient can follow-up in the office as needed.  No indication for admission at this time.  Check x-ray of left shoulder and treat if needed.  Plan will be to discharge to home        Final Clinical Impression(s) / ED Diagnoses Final diagnoses:  None    Rx / DC Orders ED Discharge Orders     None         Lorre Nick, MD 02/28/22 1531

## 2022-02-28 NOTE — ED Provider Notes (Signed)
  Physical Exam  BP (!) 157/75   Pulse 89   Temp 98.3 F (36.8 C)   Resp 15   Ht 5\' 5"  (1.651 m)   Wt 70.3 kg   SpO2 96%   BMI 25.79 kg/m   Physical Exam  Procedures  Procedures  ED Course / MDM    Medical Decision Making Amount and/or Complexity of Data Reviewed Labs: ordered. Radiology: ordered.  Risk OTC drugs. Prescription drug management. Decision regarding hospitalization.   Received care of pt from Dr. . XR completed showing chronic fracture.  Discussed with daughter. She is exhibiting sundowning as she does at home with suspected underlying dementia and feel it will be difficult to observe for acute mental status changes in setting of known ICH.  Will admit for continued observation in setting of ICH.        Freida Busman, MD 03/04/22 [phone]

## 2022-02-28 NOTE — ED Triage Notes (Signed)
PT arrives via EMS from home. Pt states she was using her walker and slipped on something. Pt unsure what she struck her head on, but has large hematoma to left eye and forehead. Pt also c/o of lower back pain from the fall. Pt denies loc, no blood thinners. AxOx4.

## 2022-02-28 NOTE — ED Notes (Addendum)
Pt refused fentanyl stating that strong pain medicines make her aggressive. Pt requesting tylenol, Schlossman MD made aware

## 2022-03-01 DIAGNOSIS — G9340 Encephalopathy, unspecified: Secondary | ICD-10-CM | POA: Diagnosis not present

## 2022-03-01 LAB — CBC WITH DIFFERENTIAL/PLATELET
Abs Immature Granulocytes: 0.02 10*3/uL (ref 0.00–0.07)
Basophils Absolute: 0 10*3/uL (ref 0.0–0.1)
Basophils Relative: 1 %
Eosinophils Absolute: 0 10*3/uL (ref 0.0–0.5)
Eosinophils Relative: 1 %
HCT: 30.5 % — ABNORMAL LOW (ref 36.0–46.0)
Hemoglobin: 9.8 g/dL — ABNORMAL LOW (ref 12.0–15.0)
Immature Granulocytes: 0 %
Lymphocytes Relative: 19 %
Lymphs Abs: 0.9 10*3/uL (ref 0.7–4.0)
MCH: 29 pg (ref 26.0–34.0)
MCHC: 32.1 g/dL (ref 30.0–36.0)
MCV: 90.2 fL (ref 80.0–100.0)
Monocytes Absolute: 0.5 10*3/uL (ref 0.1–1.0)
Monocytes Relative: 12 %
Neutro Abs: 3 10*3/uL (ref 1.7–7.7)
Neutrophils Relative %: 67 %
Platelets: 171 10*3/uL (ref 150–400)
RBC: 3.38 MIL/uL — ABNORMAL LOW (ref 3.87–5.11)
RDW: 14.4 % (ref 11.5–15.5)
WBC: 4.5 10*3/uL (ref 4.0–10.5)
nRBC: 0 % (ref 0.0–0.2)

## 2022-03-01 LAB — URINALYSIS, COMPLETE (UACMP) WITH MICROSCOPIC
Bilirubin Urine: NEGATIVE
Glucose, UA: NEGATIVE mg/dL
Ketones, ur: NEGATIVE mg/dL
Nitrite: NEGATIVE
Protein, ur: 30 mg/dL — AB
RBC / HPF: >50 RBC/hpf — ABNORMAL HIGH (ref 0–5)
Specific Gravity, Urine: 1.018 (ref 1.005–1.030)
WBC, UA: >50 WBC/hpf — ABNORMAL HIGH (ref 0–5)
pH: 7 (ref 5.0–8.0)

## 2022-03-01 LAB — COMPREHENSIVE METABOLIC PANEL
ALT: 13 U/L (ref 0–44)
AST: 16 U/L (ref 15–41)
Albumin: 3.1 g/dL — ABNORMAL LOW (ref 3.5–5.0)
Alkaline Phosphatase: 77 U/L (ref 38–126)
Anion gap: 6 (ref 5–15)
BUN: 23 mg/dL (ref 8–23)
CO2: 23 mmol/L (ref 22–32)
Calcium: 9.2 mg/dL (ref 8.9–10.3)
Chloride: 111 mmol/L (ref 98–111)
Creatinine, Ser: 1.45 mg/dL — ABNORMAL HIGH (ref 0.44–1.00)
GFR, Estimated: 35 mL/min — ABNORMAL LOW (ref >60)
Glucose, Bld: 105 mg/dL — ABNORMAL HIGH (ref 70–99)
Potassium: 4.5 mmol/L (ref 3.5–5.1)
Sodium: 140 mmol/L (ref 135–145)
Total Bilirubin: 0.2 mg/dL — ABNORMAL LOW (ref 0.3–1.2)
Total Protein: 5.3 g/dL — ABNORMAL LOW (ref 6.5–8.1)

## 2022-03-01 LAB — BLOOD GAS, VENOUS
Acid-Base Excess: 3.7 mmol/L — ABNORMAL HIGH (ref 0.0–2.0)
Bicarbonate: 27.7 mmol/L (ref 20.0–28.0)
O2 Saturation: 92 %
Patient temperature: 36.9
pCO2, Ven: 39 mmHg — ABNORMAL LOW (ref 44–60)
pH, Ven: 7.46 — ABNORMAL HIGH (ref 7.25–7.43)
pO2, Ven: 60 mmHg — ABNORMAL HIGH (ref 32–45)

## 2022-03-01 LAB — APTT: aPTT: 30 seconds (ref 24–36)

## 2022-03-01 LAB — MAGNESIUM: Magnesium: 2 mg/dL (ref 1.7–2.4)

## 2022-03-01 LAB — PROTIME-INR
INR: 1.1 (ref 0.8–1.2)
Prothrombin Time: 14 seconds (ref 11.4–15.2)

## 2022-03-01 MED ORDER — ORAL CARE MOUTH RINSE: 15.0000 mL | OROMUCOSAL | Status: DC | PRN

## 2022-03-01 MED ORDER — AMLODIPINE BESYLATE 5 MG PO TABS: 5.0000 mg | ORAL_TABLET | Freq: Every day | ORAL | Status: DC

## 2022-03-01 MED ORDER — DOCUSATE SODIUM 100 MG PO CAPS: 100.0000 mg | ORAL_CAPSULE | Freq: Every day | ORAL | Status: DC | PRN

## 2022-03-01 MED ORDER — ONDANSETRON HCL 4 MG/2ML IJ SOLN
4.0000 mg | Freq: Four times a day (QID) | INTRAMUSCULAR | Status: DC | PRN
  Administered 2022-03-01: 4 mg via INTRAVENOUS
  Filled 2022-03-01: qty 2

## 2022-03-01 MED ORDER — ACETAMINOPHEN 325 MG PO TABS: 650.0000 mg | ORAL_TABLET | ORAL | Status: DC | PRN

## 2022-03-01 NOTE — Progress Notes (Signed)
Discharge teaching complete. Meds, diet, activity, follow up appointments reviewed and all questions answered. Copy of instructions given to patient.  

## 2022-03-01 NOTE — Care Management Obs Status (Signed)
MEDICARE OBSERVATION STATUS NOTIFICATION   Patient Details  Name: QUETZALLI CLOS MRN: [id number] Date of Birth: 1931/10/08   Medicare Observation Status Notification Given:  Yes    Epifanio Lesches, RN 03/01/2022, 11:30 AM

## 2022-03-01 NOTE — TOC Initial Note (Addendum)
Transition of Care [facility]) - Initial/Assessment Note    Patient Details  Name: Nancy Suarez MRN: [id number] Date of Birth: [birth year]  Transition of Care [facility]) CM/SW Contact:    Epifanio Lesches, RN Phone Number: 03/01/2022, 11:02 AM  Clinical Narrative:    Presents with acute encephalopathy,  s/p fall.          NCM spoke with pt and daughter @ bedside regarding d/c planning. Pt is from home with daughter Nancy Suarez and grandson. PTA active with [facility], PT,OT and SLP. Pt will need  resumption orders from MD for home health services.Pt/daughter would like to continue with home @ provider @ d/c. Pt with W/C and RW @ home. Pt without transportation needs. States no Rx med concerns.  PT/OT evaluations pending...  TOC team will continue to monitor for needs.  Expected Discharge Plan: Home w Home Health Services Barriers to Discharge: Continued Medical Work up   Patient Goals and CMS Choice     Choice offered to / list presented to : Adult Children, Patient  Expected Discharge Plan and Services Expected Discharge Plan: Home w Home Health Services   Discharge Planning Services: CM Consult   Living arrangements for the past 2 months: Single Family Home                           HH Arranged: PT, OT, Social Work, Facilities manager Therapy HH Agency: [facility] Date HH Agency Contacted: 03/01/22 Time HH Agency Contacted: 1100 Representative spoke with at [facility] Agency: Morrie Sheldon  Prior Living Arrangements/Services Living arrangements for the past 2 months: Single Family Home Lives with:: Adult Children Patient language and need for interpreter reviewed:: Yes Do you feel safe going back to the place where you live?: Yes      Need for Family Participation in Patient Care: Yes (Comment) Care giver support system in place?: Yes (comment)   Criminal Activity/Legal Involvement Pertinent to Current Situation/Hospitalization: No - Comment as needed  Activities of  Daily Living      Permission Sought/Granted   Permission granted to share information with : Yes, Verbal Permission Granted  Share Information with NAME: Nancy Suarez  Daughter  [phone]           Emotional Assessment Appearance:: Appears stated age   Affect (typically observed): Accepting Orientation: : Oriented to Self, Oriented to Place, Oriented to Situation Alcohol / Substance Use: Not Applicable Psych Involvement: No (comment)  Admission diagnosis:  Subarachnoid hemorrhage (HCC) [I60.9] Multiple contusions [T07.XXXA] Acute encephalopathy [G93.40] Patient Active Problem List   Diagnosis Date Noted   Acute encephalopathy 02/28/2022   Subarachnoid hemorrhage (HCC) 02/28/2022   Allergic rhinitis 02/28/2022   Hip fracture (HCC) 11/27/2021   Normocytic anemia 11/27/2021   Physical deconditioning 04/16/2021   Essential hypertension 06/05/2017   HLD (hyperlipidemia) 06/05/2017   Depression 06/05/2017   Chronic iron deficiency anemia 06/05/2017   Acute renal failure superimposed on stage 3b chronic kidney disease (HCC) 06/05/2017   Closed fracture of left distal femur (HCC) 06/05/2017   Fall at home, initial encounter    Overweight (BMI 25.0-29.9) 01/21/2014   Postoperative anemia due to acute blood loss 01/21/2014   S/P left TKA 01/19/2014   Scoliosis    Osteopenia    Chronic insomnia    Atrophic vaginitis    Spinal arthritis    GERD (gastroesophageal reflux disease)    ASCUS (atypical squamous cells of undetermined significance) on Pap smear  Ovarian cyst    LGSIL (low grade squamous intraepithelial dysplasia)    PCP:  Corine Shelter, MD Pharmacy:   [facility] [phone] - [place], [place] - [phone] AT [facility] [address] [place] [address] Phone: [phone] Fax: [phone]  [id number] - [place], [place] - [address] AT [facility] [address] [place]  [address] Phone: [phone] Fax: [phone]     Social Determinants of Health (SDOH) Interventions    Readmission Risk Interventions    12/01/2021    1:14 PM  Readmission Risk Prevention Plan  Transportation Screening Complete  PCP or Specialist Appt within 3-5 Days Complete  HRI or Home Care Consult Complete  Social Work Consult for Recovery Care Planning/Counseling Complete  Palliative Care Screening Complete  Medication Review [employer]) Complete

## 2022-03-01 NOTE — Discharge Summary (Signed)
DISCHARGE SUMMARY  Nancy Suarez  MR#: [id number]  DOB:[birth year]  Date of Admission: 02/28/2022 Date of Discharge: 03/01/2022  Attending Physician:Brylee Mcgreal Silvestre Gunner, MD  Patient's JJO:[id number], Nancy Stallion, MD  Consults: None  Disposition: Discharge home  Follow-up Appts:  Follow-up Information     Corine Shelter, MD Follow up.   Specialty: Pulmonary Disease Contact information: [address] [place] [address] [place] [address] [phone]                 Discharge Diagnoses: Acute subarachnoid hemorrhage status post mechanical fall Dementia with acute delirium CKD stage IIIb HTN Chronic iron deficiency anemia   Initial presentation: 88 year old with a history of dementia, HTN, HLD, and chronic iron deficiency anemia with a baseline hemoglobin approximately 8 who presented to the ER after a fall and was found to be confused.  She suffered a ground-level mechanical fall when she tripped striking the left side of her head.  There was no loss of consciousness.  The patient's family reports that she does have a baseline history of dementia.  CT head at presentation noted a 1 cm subarachnoid hemorrhage in the left frontal cortex without intraventricular blood, as well as a large left frontal scalp hematoma and left periorbital hematoma.  There was no evidence of skull fracture.  Maxillofacial CT was also without evidence of fracture.  CT cervical spine was without acute findings.    Hospital Course:  Acute subarachnoid hemorrhage status post mechanical fall Case reviewed by Neurosurgery who did not feel any further imaging or intervention was indicated -patient was kept for observation overnight given complicating factor of her altered mental status -8/30 during daytime hours the patient's mental status had returned to her baseline as supported by family -there is no evidence of clinical decline and the patient was therefore cleared for discharge home   Dementia with acute  delirium Consistent with sundowning likely related to blunt force head injury as well as unfamiliar environment with baseline of dementia -mental status returned to her baseline prior to her discharge   Positive UA POA  Urinalysis was not normal but patient had no symptoms to suggest a urinary tract infection -her WBC was normal and she was afebrile during her hospital stay -the decision was made to not initiate antibiotic therapy   CKD stage IIIb Review of records suggest baseline GFR 37-40 - creatinine stable on day of discharge at approximately 1.4 placing current GFR in the same range   HTN Blood pressure reasonably controlled for clinical circumstance and advanced age with no changes in therapy during this hospital stay   Chronic iron deficiency anemia Hemoglobin stable during this hospital stay  Allergies as of 03/01/2022       Reactions   Ambien [zolpidem] Other (See Comments)   Disorientation Aggression    Aspirin Other (See Comments)   Upsets stomach if 325mg    Codeine Nausea And Vomiting   Namenda [memantine] Other (See Comments)   Agitation Aggression    Percocet [oxycodone-acetaminophen] Other (See Comments)   Disorientation Aggression     Sulfa Antibiotics Nausea Only        Medication List     STOP taking these medications    amLODipine 2.5 MG tablet Commonly known as: NORVASC   ferrous sulfate 325 (65 FE) MG tablet       TAKE these medications    acetaminophen 500 MG tablet Commonly known as: TYLENOL Take 2 tablets (1,000 mg total) by mouth 2 (two) times daily. What changed:  when  to take this additional instructions   ALLEGRA ALLERGY PO Take 1 tablet by mouth daily.   atorvastatin 20 MG tablet Commonly known as: LIPITOR Take 20 mg by mouth daily.   BIOFREEZE EX Apply 1 application  topically 2 (two) times daily as needed (knee pain).   carboxymethylcellulose 0.5 % Soln Commonly known as: REFRESH PLUS Place 1 drop into both eyes in  the morning, at noon, and at bedtime.   docusate sodium 100 MG capsule Commonly known as: COLACE Take 100 mg by mouth daily as needed for mild constipation.   escitalopram 10 MG tablet Commonly known as: LEXAPRO Take 10 mg by mouth daily.   famotidine 20 MG tablet Commonly known as: PEPCID Take 20 mg by mouth 2 (two) times daily.   fluticasone 50 MCG/ACT nasal spray Commonly known as: FLONASE Place 1-2 sprays into both nostrils daily as needed for allergies.   MELATONIN PO Take 2 tablets by mouth at bedtime.   mirtazapine 30 MG tablet Commonly known as: REMERON Take 1 tablet (30 mg total) by mouth at bedtime. What changed:  when to take this additional instructions   Multivitamin Adult Chew Chew 1 Dose by mouth daily.        Day of Discharge BP (!) 153/68 (BP Location: Left Arm)   Pulse 82   Temp 98.7 F (37.1 C) (Oral)   Resp 16   Ht 5\' 5"  (1.651 m)   Wt 62.2 kg   SpO2 96%   BMI 22.82 kg/m   Physical Exam: General: No acute respiratory distress Lungs: Clear to auscultation bilaterally without wheezes or crackles Cardiovascular: Regular rate and rhythm without murmur gallop or rub normal S1 and S2 Abdomen: Nontender, nondistended, soft, bowel sounds positive, no rebound, no ascites, no appreciable mass Extremities: No significant cyanosis, clubbing, or edema bilateral lower extremities  Basic Metabolic Panel: Recent Labs  Lab 02/28/22 1912 02/28/22 1913 03/01/22 0049  NA  --  141 140  K  --  4.3 4.5  CL  --  110 111  CO2  --  22 23  GLUCOSE  --  97 105*  BUN  --  19 23  CREATININE  --  1.39* 1.45*  CALCIUM  --  9.4 9.2  MG 1.9  --  2.0    CBC: Recent Labs  Lab 02/28/22 1913 03/01/22 0049  WBC 5.7 4.5  NEUTROABS 4.3 3.0  HGB 10.4* 9.8*  HCT 32.6* 30.5*  MCV 91.3 90.2  PLT 196 171    Time spent in discharge (includes decision making & examination of pt): 35 minutes  03/01/2022, 2:54 PM   03/03/2022, MD [facility] Office  [phone]

## 2022-03-01 NOTE — Evaluation (Signed)
Occupational Therapy Evaluation Patient Details Name: Nancy Suarez MRN: [id number] DOB: [birth year] Today's Date: 03/01/2022   History of Present Illness 88 y.o. female with medical history significant for dementia, essential hypertension, hyperlipidemia, chronic iron deficiency anemia, who was admitted to [facility] on 02/28/2022 with acute encephalopathy after a fall at home.   Clinical Impression   Patient admitted for the diagnosis above.  PTA she lives with her daughter, who provides assist as needed.  Currently she is functioning at ,or near, her baseline for mobility, at RW level, ADL completion from sit to stand level, and mentation is improving.  Daughter is in agreement, and the patient is hoping to return home with assist as needed, and a continuation of HH services.  No significant acute care rehab needs, OT will defer to rehab services at the next level.  Encourage staff to mobilize patient to the bathroom, and out of bed for meals.         Recommendations for follow up therapy are one component of a multi-disciplinary discharge planning process, led by the attending physician.  Recommendations may be updated based on patient status, additional functional criteria and insurance authorization.   Follow Up Recommendations  Other (comment) (HH OT and PT)    Assistance Recommended at Discharge Intermittent Supervision/Assistance  Patient can return home with the following A little help with walking and/or transfers;A lot of help with bathing/dressing/bathroom;Help with stairs or ramp for entrance;Direct supervision/assist for medications management;Assist for transportation;Assistance with cooking/housework    Functional Status Assessment  Patient has had a recent decline in their functional status and demonstrates the ability to make significant improvements in function in a reasonable and predictable amount of time.  Equipment Recommendations  None recommended by OT     Recommendations for Other Services       Precautions / Restrictions Precautions Precautions: Fall Restrictions Weight Bearing Restrictions: No      Mobility Bed Mobility Overal bed mobility: Needs Assistance Bed Mobility: Supine to Sit     Supine to sit: Supervision          Transfers Overall transfer level: Needs assistance Equipment used: Rolling walker (2 wheels) Transfers: Sit to/from Stand, Bed to chair/wheelchair/BSC Sit to Stand: Supervision     Step pivot transfers: Min guard            Balance Overall balance assessment: Needs assistance Sitting-balance support: Feet supported, Bilateral upper extremity supported Sitting balance-Leahy Scale: Good     Standing balance support: Reliant on assistive device for balance Standing balance-Leahy Scale: Poor                             ADL either performed or assessed with clinical judgement   ADL Overall ADL's : At baseline                                       General ADL Comments: assist as needed     Vision Baseline Vision/History: 1 Wears glasses Patient Visual Report: No change from baseline       Perception Perception Perception: Within Functional Limits   Praxis Praxis Praxis: Intact    Pertinent Vitals/Pain Pain Assessment Pain Assessment: Faces Faces Pain Scale: Hurts little more Pain Location: R hip Pain Descriptors / Indicators: Aching Pain Intervention(s): Monitored during session     Hand Dominance Right  Extremity/Trunk Assessment Upper Extremity Assessment Upper Extremity Assessment: Overall WFL for tasks assessed   Lower Extremity Assessment Lower Extremity Assessment: Generalized weakness   Cervical / Trunk Assessment Cervical / Trunk Assessment: Kyphotic   Communication Communication Communication: No difficulties   Cognition Arousal/Alertness: Awake/alert Behavior During Therapy: WFL for tasks assessed/performed Overall  Cognitive Status: History of cognitive impairments - at baseline                                                        Home Living Family/patient expects to be discharged to:: Private residence Living Arrangements: Children Available Help at Discharge: Family;Available 24 hours/day Type of Home: House       Home Layout: Two level     Bathroom Shower/Tub: Tub/shower unit;Walk-in shower     Bathroom Accessibility: Yes How Accessible: Accessible via walker Home Equipment: Rolling Walker (2 wheels);Rollator (4 wheels);Wheelchair - IT trainer;Tub bench   Additional Comments: stair lift.      Prior Functioning/Environment Prior Level of Function : Needs assist       Physical Assist : Mobility (physical);ADLs (physical) Mobility (physical): Transfers;Gait ADLs (physical): Bathing;Dressing;Toileting            OT Problem List: Decreased strength;Decreased activity tolerance;Impaired balance (sitting and/or standing);Pain      OT Treatment/Interventions:      OT Goals(Current goals can be found in the care plan section) Acute Rehab OT Goals Patient Stated Goal: Return home OT Goal Formulation: With patient Time For Goal Achievement: 03/06/22 Potential to Achieve Goals: Good  OT Frequency:      Co-evaluation              AM-PAC OT "6 Clicks" Daily Activity     Outcome Measure Help from another person eating meals?: None Help from another person taking care of personal grooming?: A Little Help from another person toileting, which includes using toliet, bedpan, or urinal?: A Little Help from another person bathing (including washing, rinsing, drying)?: A Lot Help from another person to put on and taking off regular upper body clothing?: A Little Help from another person to put on and taking off regular lower body clothing?: A Lot 6 Click Score: 17   End of Session Equipment Utilized During Treatment: Gait belt;Rolling walker  (2 wheels) Nurse Communication: Mobility status  Activity Tolerance: Patient tolerated treatment well Patient left: in bed;with call bell/phone within reach;with family/visitor present  OT Visit Diagnosis: Unsteadiness on feet (R26.81);Muscle weakness (generalized) (M62.81);Pain Pain - Right/Left: Right Pain - part of body: Leg                Time: 1130-1159 OT Time Calculation (min): 29 min Charges:  OT General Charges $OT Visit: 1 Visit OT Evaluation $OT Eval Moderate Complexity: 1 Mod OT Treatments $Self Care/Home Management : 8-22 mins  03/01/2022  RP, OTR/L  Acute Rehabilitation Services  Office:  [phone]   Suzanna Obey 03/01/2022, 12:07 PM

## 2022-03-01 NOTE — Progress Notes (Signed)
PT Cancellation Note  Patient Details Name: Nancy Suarez MRN: [id number] DOB: 25-Apr-1932   Cancelled Treatment:    Reason Eval/Treat Not Completed: PT screened, no needs identified, will sign off. Contacted by Luan Pulling, OT stating pt is functioning at baseline. Daughter is present 24/7, has all needed DME needed, and is currently having home health PT/OT. Pt with no skilled PT need. PT SIGNING OFF. Please re-consult if needed in future.  Lewis Shock, PT, DPT Acute Rehabilitation Services Secure chat preferred Office #: [phone]    Iona Hansen 03/01/2022, 11:56 AM

## 2022-03-03 ENCOUNTER — Encounter: Payer: Self-pay | Admitting: Adult Health

## 2022-03-04 ENCOUNTER — Ambulatory Visit
Admission: RE | Admit: 2022-03-04 | Discharge: 2022-03-04 | Disposition: A | Discharge status: Home / Self Care | Payer: Medicare Other | Source: Ambulatory Visit | Attending: Adult Health | Admitting: Adult Health

## 2022-03-04 DIAGNOSIS — F03918 Unspecified dementia, unspecified severity, with other behavioral disturbance: Secondary | ICD-10-CM | POA: Diagnosis not present

## 2022-03-04 DIAGNOSIS — R4189 Other symptoms and signs involving cognitive functions and awareness: Secondary | ICD-10-CM

## 2022-03-08 ENCOUNTER — Telehealth: Payer: Self-pay | Admitting: Adult Health

## 2022-03-08 ENCOUNTER — Ambulatory Visit (HOSPITAL_COMMUNITY): Payer: Medicare Other

## 2022-03-08 ENCOUNTER — Encounter: Payer: Self-pay | Admitting: *Deleted

## 2022-03-08 NOTE — Telephone Encounter (Signed)
Pt's daughter called needing to speak to the Provider or RN regarding her mother getting worse. Byrd Hesselbach states that the pt has been more agitated, confused, aggressive and not allowing her to give her the days medication. Byrd Hesselbach is needing to be advised on what else she can do to help her mother.

## 2022-03-08 NOTE — Telephone Encounter (Signed)
Pt's daughter returned phone call, would like a call back.

## 2022-03-08 NOTE — Telephone Encounter (Signed)
Would first recommend trying to crush medications and adding to foods she likes, possibly applesauce. Fluctuation of taking medications can be contributing to mood fluctuation. If unable to tolerate the taste of the crushed pills, can trial Depakote sprinkles which she may tolerate better.  If patient is a risk/threat to herself or others, do not hesitate to call 911 immediately for further assistance. I am not aware of any other psychiatrist that could have a sooner visit but daughter can call around to different places, also ensure patient is on cancellation list for hopeful sooner visit. Thank you.

## 2022-03-08 NOTE — Telephone Encounter (Signed)
MR brain result sent via MyChart already.  Would recommend decreasing melatonin down to 10 mg nightly as too much melatonin could be contributing to agitation, please ensure melatonin is being given a couple of hours prior to bedtime consistently every night.  Agree with contacting pharmacy to discuss crushing medications, could also see if any of them come in liquid form.

## 2022-03-08 NOTE — Telephone Encounter (Signed)
Called Nancy Suarez, LVM requesting call back to discuss.

## 2022-03-08 NOTE — Telephone Encounter (Signed)
Sent info via mychart

## 2022-03-08 NOTE — Telephone Encounter (Signed)
Called daughter who stated mirtazapine has been increased to 30 mg. Mom often refuses to take any meds either from daughter or her caregiver.  I advised her some meds can be crushed and added to her favorite foods.  We discussed possibility of depakote sprinkles, or a patch. Daughter stated whatever Shanda Bumps thinks will work she is willing to try. She stated mother is getting more aggressive, even paranoid, walks and curses all night.  No one in home rests.  She noted they have appointment with  Psychologist in Nov, cash only.  She is wondering if  NP knows of anything sooner that may be available.  I advised will send message to NP, advised she keep Nov appointment with psych. Daughter  verbalized understanding, appreciation.

## 2022-03-08 NOTE — Telephone Encounter (Signed)
Called daughter and reviewed NPs message. She stated her mother counts her pills, but she'll call pharmacy to see if she can crush meds. She's on wait list for psychiatrist. She gives mom melatonin 20 mg at night but stated it doesn't help. I advised she stop giving it, discuss with pharmacist another safe sleep aid, possibly herbal supplement. She will. She asked if MRI brain result is back. I advised will have NP review, call her when result ready. She verbalized understanding, appreciation.

## 2022-03-10 ENCOUNTER — Telehealth: Payer: Self-pay | Admitting: *Deleted

## 2022-03-10 NOTE — Patient Outreach (Signed)
  Care Coordination   03/10/2022 Name: Nancy Suarez MRN: [id number] DOB: 05-30-1932   Care Coordination Outreach Attempts:  A second unsuccessful outreach was attempted today to offer the patient with information about available care coordination services as a benefit of their health plan.     Follow Up Plan:  Additional outreach attempts will be made to offer the patient care coordination information and services.   Encounter Outcome:  No Answer  Sent NP info via text for return call. Care Coordination Interventions Activated:  No    Care Coordination Interventions:  No, not indicated    SIG Kaiyden Simkin C. Burgess Estelle, MSN, [facility] Gerontological Nurse Practitioner [facility] Care Management [phone]

## 2022-03-12 ENCOUNTER — Encounter (HOSPITAL_BASED_OUTPATIENT_CLINIC_OR_DEPARTMENT_OTHER): Payer: Self-pay | Admitting: Emergency Medicine

## 2022-03-12 ENCOUNTER — Other Ambulatory Visit: Payer: Self-pay

## 2022-03-12 ENCOUNTER — Emergency Department (HOSPITAL_BASED_OUTPATIENT_CLINIC_OR_DEPARTMENT_OTHER): Payer: Medicare Other

## 2022-03-12 ENCOUNTER — Encounter: Payer: Self-pay | Admitting: Adult Health

## 2022-03-12 ENCOUNTER — Inpatient Hospital Stay (HOSPITAL_BASED_OUTPATIENT_CLINIC_OR_DEPARTMENT_OTHER)
Admission: EM | Admit: 2022-03-12 | Discharge: 2022-03-31 | DRG: 082 | Disposition: A | Discharge status: Home Health | Payer: Medicare Other | Attending: Internal Medicine | Admitting: Internal Medicine

## 2022-03-12 ENCOUNTER — Encounter (HOSPITAL_COMMUNITY): Payer: Self-pay

## 2022-03-12 DIAGNOSIS — Z8249 Family history of ischemic heart disease and other diseases of the circulatory system: Secondary | ICD-10-CM | POA: Diagnosis not present

## 2022-03-12 DIAGNOSIS — R45851 Suicidal ideations: Secondary | ICD-10-CM | POA: Diagnosis not present

## 2022-03-12 DIAGNOSIS — Z882 Allergy status to sulfonamides status: Secondary | ICD-10-CM

## 2022-03-12 DIAGNOSIS — R4182 Altered mental status, unspecified: Secondary | ICD-10-CM | POA: Diagnosis not present

## 2022-03-12 DIAGNOSIS — E785 Hyperlipidemia, unspecified: Secondary | ICD-10-CM | POA: Diagnosis not present

## 2022-03-12 DIAGNOSIS — F05 Delirium due to known physiological condition: Secondary | ICD-10-CM | POA: Diagnosis not present

## 2022-03-12 DIAGNOSIS — F419 Anxiety disorder, unspecified: Secondary | ICD-10-CM | POA: Diagnosis present

## 2022-03-12 DIAGNOSIS — R911 Solitary pulmonary nodule: Secondary | ICD-10-CM | POA: Diagnosis present

## 2022-03-12 DIAGNOSIS — J9811 Atelectasis: Secondary | ICD-10-CM | POA: Diagnosis not present

## 2022-03-12 DIAGNOSIS — E7800 Pure hypercholesterolemia, unspecified: Secondary | ICD-10-CM | POA: Diagnosis present

## 2022-03-12 DIAGNOSIS — W010XXA Fall on same level from slipping, tripping and stumbling without subsequent striking against object, initial encounter: Secondary | ICD-10-CM | POA: Diagnosis present

## 2022-03-12 DIAGNOSIS — I6782 Cerebral ischemia: Secondary | ICD-10-CM | POA: Diagnosis not present

## 2022-03-12 DIAGNOSIS — D509 Iron deficiency anemia, unspecified: Secondary | ICD-10-CM | POA: Diagnosis not present

## 2022-03-12 DIAGNOSIS — F03918 Unspecified dementia, unspecified severity, with other behavioral disturbance: Secondary | ICD-10-CM | POA: Diagnosis not present

## 2022-03-12 DIAGNOSIS — Z888 Allergy status to other drugs, medicaments and biological substances status: Secondary | ICD-10-CM

## 2022-03-12 DIAGNOSIS — I639 Cerebral infarction, unspecified: Secondary | ICD-10-CM | POA: Diagnosis not present

## 2022-03-12 DIAGNOSIS — Z885 Allergy status to narcotic agent status: Secondary | ICD-10-CM

## 2022-03-12 DIAGNOSIS — S065XAA Traumatic subdural hemorrhage with loss of consciousness status unknown, initial encounter: Secondary | ICD-10-CM | POA: Diagnosis present

## 2022-03-12 DIAGNOSIS — Z87891 Personal history of nicotine dependence: Secondary | ICD-10-CM | POA: Diagnosis not present

## 2022-03-12 DIAGNOSIS — R220 Localized swelling, mass and lump, head: Secondary | ICD-10-CM | POA: Diagnosis not present

## 2022-03-12 DIAGNOSIS — D631 Anemia in chronic kidney disease: Secondary | ICD-10-CM | POA: Diagnosis present

## 2022-03-12 DIAGNOSIS — Z79899 Other long term (current) drug therapy: Secondary | ICD-10-CM

## 2022-03-12 DIAGNOSIS — K219 Gastro-esophageal reflux disease without esophagitis: Secondary | ICD-10-CM | POA: Diagnosis present

## 2022-03-12 DIAGNOSIS — R5381 Other malaise: Secondary | ICD-10-CM | POA: Diagnosis present

## 2022-03-12 DIAGNOSIS — R569 Unspecified convulsions: Secondary | ICD-10-CM | POA: Diagnosis not present

## 2022-03-12 DIAGNOSIS — R918 Other nonspecific abnormal finding of lung field: Secondary | ICD-10-CM | POA: Diagnosis not present

## 2022-03-12 DIAGNOSIS — G9341 Metabolic encephalopathy: Secondary | ICD-10-CM | POA: Diagnosis present

## 2022-03-12 DIAGNOSIS — Z96652 Presence of left artificial knee joint: Secondary | ICD-10-CM | POA: Diagnosis present

## 2022-03-12 DIAGNOSIS — N1831 Chronic kidney disease, stage 3a: Secondary | ICD-10-CM | POA: Diagnosis present

## 2022-03-12 DIAGNOSIS — I6200 Nontraumatic subdural hemorrhage, unspecified: Secondary | ICD-10-CM | POA: Diagnosis not present

## 2022-03-12 DIAGNOSIS — F4321 Adjustment disorder with depressed mood: Secondary | ICD-10-CM | POA: Diagnosis present

## 2022-03-12 DIAGNOSIS — F32A Depression, unspecified: Secondary | ICD-10-CM | POA: Diagnosis not present

## 2022-03-12 DIAGNOSIS — J309 Allergic rhinitis, unspecified: Secondary | ICD-10-CM | POA: Diagnosis not present

## 2022-03-12 DIAGNOSIS — Z781 Physical restraint status: Secondary | ICD-10-CM

## 2022-03-12 DIAGNOSIS — Z886 Allergy status to analgesic agent status: Secondary | ICD-10-CM

## 2022-03-12 DIAGNOSIS — S0003XA Contusion of scalp, initial encounter: Secondary | ICD-10-CM | POA: Diagnosis not present

## 2022-03-12 DIAGNOSIS — N179 Acute kidney failure, unspecified: Secondary | ICD-10-CM | POA: Diagnosis present

## 2022-03-12 DIAGNOSIS — R531 Weakness: Secondary | ICD-10-CM

## 2022-03-12 DIAGNOSIS — R296 Repeated falls: Secondary | ICD-10-CM | POA: Diagnosis not present

## 2022-03-12 DIAGNOSIS — Z90711 Acquired absence of uterus with remaining cervical stump: Secondary | ICD-10-CM

## 2022-03-12 DIAGNOSIS — I129 Hypertensive chronic kidney disease with stage 1 through stage 4 chronic kidney disease, or unspecified chronic kidney disease: Secondary | ICD-10-CM | POA: Diagnosis present

## 2022-03-12 DIAGNOSIS — F69 Unspecified disorder of adult personality and behavior: Secondary | ICD-10-CM | POA: Diagnosis not present

## 2022-03-12 DIAGNOSIS — G9340 Encephalopathy, unspecified: Secondary | ICD-10-CM | POA: Diagnosis not present

## 2022-03-12 DIAGNOSIS — I959 Hypotension, unspecified: Secondary | ICD-10-CM | POA: Diagnosis not present

## 2022-03-12 DIAGNOSIS — F5104 Psychophysiologic insomnia: Secondary | ICD-10-CM | POA: Diagnosis present

## 2022-03-12 DIAGNOSIS — G319 Degenerative disease of nervous system, unspecified: Secondary | ICD-10-CM | POA: Diagnosis not present

## 2022-03-12 DIAGNOSIS — R410 Disorientation, unspecified: Secondary | ICD-10-CM | POA: Diagnosis not present

## 2022-03-12 DIAGNOSIS — G9389 Other specified disorders of brain: Secondary | ICD-10-CM | POA: Diagnosis not present

## 2022-03-12 DIAGNOSIS — I10 Essential (primary) hypertension: Secondary | ICD-10-CM | POA: Diagnosis not present

## 2022-03-12 LAB — BASIC METABOLIC PANEL
Anion gap: 9 (ref 5–15)
BUN: 33 mg/dL — ABNORMAL HIGH (ref 8–23)
CO2: 24 mmol/L (ref 22–32)
Calcium: 9.3 mg/dL (ref 8.9–10.3)
Chloride: 106 mmol/L (ref 98–111)
Creatinine, Ser: 2.1 mg/dL — ABNORMAL HIGH (ref 0.44–1.00)
GFR, Estimated: 22 mL/min — ABNORMAL LOW (ref >60)
Glucose, Bld: 80 mg/dL (ref 70–99)
Potassium: 4.7 mmol/L (ref 3.5–5.1)
Sodium: 139 mmol/L (ref 135–145)

## 2022-03-12 LAB — CBC WITH DIFFERENTIAL/PLATELET
Abs Immature Granulocytes: 0.05 10*3/uL (ref 0.00–0.07)
Basophils Absolute: 0 10*3/uL (ref 0.0–0.1)
Basophils Relative: 1 %
Eosinophils Absolute: 0.1 10*3/uL (ref 0.0–0.5)
Eosinophils Relative: 1 %
HCT: 31.7 % — ABNORMAL LOW (ref 36.0–46.0)
Hemoglobin: 10 g/dL — ABNORMAL LOW (ref 12.0–15.0)
Immature Granulocytes: 1 %
Lymphocytes Relative: 20 %
Lymphs Abs: 0.9 10*3/uL (ref 0.7–4.0)
MCH: 29 pg (ref 26.0–34.0)
MCHC: 31.5 g/dL (ref 30.0–36.0)
MCV: 91.9 fL (ref 80.0–100.0)
Monocytes Absolute: 0.6 10*3/uL (ref 0.1–1.0)
Monocytes Relative: 13 %
Neutro Abs: 2.9 10*3/uL (ref 1.7–7.7)
Neutrophils Relative %: 64 %
Platelets: 222 10*3/uL (ref 150–400)
RBC: 3.45 MIL/uL — ABNORMAL LOW (ref 3.87–5.11)
RDW: 15.5 % (ref 11.5–15.5)
WBC: 4.5 10*3/uL (ref 4.0–10.5)
nRBC: 0 % (ref 0.0–0.2)

## 2022-03-12 LAB — URINALYSIS, ROUTINE W REFLEX MICROSCOPIC
Bilirubin Urine: NEGATIVE
Glucose, UA: NEGATIVE mg/dL
Hgb urine dipstick: NEGATIVE
Ketones, ur: 15 mg/dL — AB
Leukocytes,Ua: NEGATIVE
Nitrite: NEGATIVE
Protein, ur: NEGATIVE mg/dL
Specific Gravity, Urine: 1.02 (ref 1.005–1.030)
pH: 7 (ref 5.0–8.0)

## 2022-03-12 LAB — LIPASE, BLOOD: Lipase: 46 U/L (ref 11–51)

## 2022-03-12 LAB — ACETAMINOPHEN LEVEL: Acetaminophen (Tylenol), Serum: 13 ug/mL (ref 10–30)

## 2022-03-12 LAB — HEPATIC FUNCTION PANEL
ALT: 14 U/L (ref 0–44)
AST: 19 U/L (ref 15–41)
Albumin: 3.2 g/dL — ABNORMAL LOW (ref 3.5–5.0)
Alkaline Phosphatase: 72 U/L (ref 38–126)
Bilirubin, Direct: 0.2 mg/dL (ref 0.0–0.2)
Indirect Bilirubin: 0.8 mg/dL (ref 0.3–0.9)
Total Bilirubin: 1 mg/dL (ref 0.3–1.2)
Total Protein: 6 g/dL — ABNORMAL LOW (ref 6.5–8.1)

## 2022-03-12 LAB — SALICYLATE LEVEL: Salicylate Lvl: <7 mg/dL — ABNORMAL LOW (ref 7.0–30.0)

## 2022-03-12 LAB — AMMONIA: Ammonia: <10 umol/L (ref 9–35)

## 2022-03-12 MED ORDER — ACETAMINOPHEN 500 MG PO TABS
1000.0000 mg | ORAL_TABLET | Freq: Once | ORAL | Status: AC
  Administered 2022-03-12: 1000 mg via ORAL
  Filled 2022-03-12: qty 2

## 2022-03-12 MED ORDER — HALOPERIDOL LACTATE 5 MG/ML IJ SOLN
1.0000 mg | Freq: Once | INTRAMUSCULAR | Status: AC
  Administered 2022-03-12: 1 mg via INTRAVENOUS
  Filled 2022-03-12: qty 1

## 2022-03-12 MED ORDER — SODIUM CHLORIDE 0.9 % IV BOLUS
1000.0000 mL | Freq: Once | INTRAVENOUS | Status: AC
  Administered 2022-03-12: 1000 mL via INTRAVENOUS

## 2022-03-12 MED ORDER — LORAZEPAM 2 MG/ML IJ SOLN
1.0000 mg | Freq: Once | INTRAMUSCULAR | Status: AC
  Administered 2022-03-12: 1 mg via INTRAVENOUS
  Filled 2022-03-12: qty 1

## 2022-03-12 NOTE — ED Triage Notes (Signed)
Pt brought in by [facility] with c/o  ? UTI and altered emotional responses for past 2 weeks , fell  2 weeks and hit head was seen here and sent home   146 86  74 HR 97 RA CBG 110

## 2022-03-12 NOTE — ED Notes (Signed)
Pt assisted up to BR with walker to get Urine, missed the hat but states only peed very small amt

## 2022-03-12 NOTE — ED Notes (Signed)
PT at the end of the bed exteremly uncooperative. PT unsteady when stood up and is high fall risk.Safety Sitter role assumed at this time and remained at bedside. PT became drowsy from med's. PT slid into bed with RN assistance and taken to CT. Safety sitter role ended at 9:24.

## 2022-03-12 NOTE — ED Notes (Signed)
Patient is very irate , refusing blood draw and CT scan. Daughter is in the room and unable to calm patient down. Staff including myself have tried therapeutic communication with no success. Patient medicated as ordered . Staff member Weston Brass is sitting with patient to maintain safety .

## 2022-03-12 NOTE — ED Provider Notes (Signed)
[facility] EMERGENCY DEPARTMENT Provider Note  CSN: [id number] Arrival date & time: 03/12/22 1404  Chief Complaint(s) Dysuria  HPI Nancy GAUGHRAN is a 88 y.o. female with history of hypertension, hyperlipidemia presenting to the emergency department for foul-smelling urine.  Patient reports that she has had foul-smelling urine for the past 2 weeks.  She denies any dysuria, urinary frequency or urgency.  Denies any flank pain.  Denies fevers or chills.  Denies nausea or vomiting.  She was recently admitted for traumatic subarachnoid hemorrhage, reports otherwise has been doing well, denies headaches, nausea or vomiting, confusion, numbness tingling, weakness, trouble walking.   Past Medical History Past Medical History:  Diagnosis Date   Allergic rhinitis    ASCUS (atypical squamous cells of undetermined significance) on Pap smear    Atrophic vaginitis    Chronic insomnia    Depression    GERD (gastroesophageal reflux disease)    Goiter    History of kidney stones    Hypercholesteremia    Hypertension    LGSIL (low grade squamous intraepithelial dysplasia)    Osteopenia    Ovarian cyst    "shrank it"   Pancreatitis    Scoliosis    Spinal arthritis    Patient Active Problem List   Diagnosis Date Noted   Subdural hematoma (HCC) 03/12/2022   Acute encephalopathy 02/28/2022   Subarachnoid hemorrhage (HCC) 02/28/2022   Allergic rhinitis 02/28/2022   Hip fracture (HCC) 11/27/2021   Normocytic anemia 11/27/2021   Physical deconditioning 04/16/2021   Essential hypertension 06/05/2017   HLD (hyperlipidemia) 06/05/2017   Depression 06/05/2017   Chronic iron deficiency anemia 06/05/2017   Acute renal failure superimposed on stage 3b chronic kidney disease (HCC) 06/05/2017   Closed fracture of left distal femur (HCC) 06/05/2017   Fall at home, initial encounter    Overweight (BMI 25.0-29.9) 01/21/2014   Postoperative anemia due to acute blood loss 01/21/2014   S/P  left TKA 01/19/2014   Scoliosis    Osteopenia    Chronic insomnia    Atrophic vaginitis    Spinal arthritis    GERD (gastroesophageal reflux disease)    ASCUS (atypical squamous cells of undetermined significance) on Pap smear    Ovarian cyst    LGSIL (low grade squamous intraepithelial dysplasia)    Home Medication(s) Prior to Admission medications   Medication Sig Start Date End Date Taking? Authorizing Provider  acetaminophen (TYLENOL) 500 MG tablet Take 2 tablets (1,000 mg total) by mouth 2 (two) times daily. Patient taking differently: Take 1,000 mg by mouth See admin instructions. 1000 mg twice daily (morning and evening) + an additional 500 mg during the day as needed for pain. 12/05/21  Yes Jerald Kief, MD  escitalopram (LEXAPRO) 10 MG tablet Take 10 mg by mouth daily.   Yes [provider]  famotidine (PEPCID) 20 MG tablet Take 20 mg by mouth 2 (two) times daily.   Yes [provider]  fluticasone (FLONASE) 50 MCG/ACT nasal spray Place 1-2 sprays into both nostrils daily as needed for allergies.   Yes [provider]  atorvastatin (LIPITOR) 20 MG tablet Take 20 mg by mouth daily. 09/13/21   [provider]  carboxymethylcellulose (REFRESH PLUS) 0.5 % SOLN Place 1 drop into both eyes in the morning, at noon, and at bedtime.    [provider]  docusate sodium (COLACE) 100 MG capsule Take 100 mg by mouth daily as needed for mild constipation.    [provider]  Fexofenadine HCl (ALLEGRA ALLERGY PO) Take 1 tablet by mouth daily.    [provider]  MELATONIN PO Take 2 tablets by mouth at bedtime.    [provider]  Menthol, Topical Analgesic, (BIOFREEZE EX) Apply 1 application  topically 2 (two) times daily as needed (knee pain).    [provider]  mirtazapine (REMERON) 30 MG tablet Take 1 tablet (30 mg total) by mouth at bedtime. Patient taking differently: Take 30 mg by mouth See admin instructions.  15 mg around 14:00, 15 mg around 20:00-21:00 02/14/22   Ihor Austin, NP  Multiple Vitamins-Minerals (MULTIVITAMIN ADULT) CHEW Chew 1 Dose by mouth daily.    [provider]                                                                                                                                    Past Surgical History Past Surgical History:  Procedure Laterality Date   ABDOMINAL HYSTERECTOMY  1977   partial   INTRAMEDULLARY (IM) NAIL INTERTROCHANTERIC Right 11/28/2021   Procedure: INTRAMEDULLARY (IM) NAIL INTERTROCHANTRIC;  Surgeon: Samson Frederic, MD;  Location: [facility];  Service: Orthopedics;  Laterality: Right;   KIDNEY STONE SURGERY  1990's   2-3 stones x1 surgery   ORIF FEMUR FRACTURE Left 06/09/2017   Procedure: OPEN REDUCTION INTERNAL FIXATION (ORIF) DISTAL FEMUR FRACTURE;  Surgeon: Durene Romans, MD;  Location: [facility];  Service: Orthopedics;  Laterality: Left;   TONSILLECTOMY  as teenager   TOTAL KNEE ARTHROPLASTY Left 01/19/2014   Procedure: LEFT TOTAL KNEE ARTHROPLASTY;  Surgeon: Shelda Pal, MD;  Location: [facility];  Service: Orthopedics;  Laterality: Left;   WISDOM TOOTH EXTRACTION     Family History Family History  Problem Relation Age of Onset   Hypertension Father    Cancer Father    Cancer Mother        Bone   Cancer Brother     Social History Social History   Tobacco Use   Smoking status: Former    Years: 20.00    Types: Cigarettes    Quit date: 07/03/1978    Years since quitting: 43.7   Smokeless tobacco: Former  Building services engineer Use: Never used  Substance Use Topics   Alcohol use: Yes    Comment: occ   Drug use: No   Allergies Ambien [zolpidem], Aspirin, Codeine, Namenda [memantine], Percocet [oxycodone-acetaminophen], and Sulfa antibiotics  Review of Systems Review of Systems  All other systems reviewed and are negative.   Physical Exam Vital Signs  I have reviewed the triage vital signs BP (!) 157/74 (BP Location: Right  Arm)   Pulse (!) 59   Temp 98 F (36.7 C) (Oral)   Resp 16   Ht 5\' 5"  (1.651 m)   Wt 62 kg   SpO2 100%   BMI 22.75 kg/m  Physical Exam Vitals and nursing note reviewed.  Constitutional:  General: She is not in acute distress.    Appearance: She is well-developed.  HENT:     Head: Normocephalic and atraumatic.     Comments: Hematoma to the left side of the forehead, appears old    Mouth/Throat:     Mouth: Mucous membranes are moist.  Eyes:     Pupils: Pupils are equal, round, and reactive to light.  Cardiovascular:     Rate and Rhythm: Normal rate and regular rhythm.     Heart sounds: No murmur heard. Pulmonary:     Effort: Pulmonary effort is normal. No respiratory distress.     Breath sounds: Normal breath sounds.  Abdominal:     General: Abdomen is flat.     Palpations: Abdomen is soft.     Tenderness: There is no abdominal tenderness.  Musculoskeletal:        General: No tenderness.     Right lower leg: No edema.     Left lower leg: No edema.  Skin:    General: Skin is warm and dry.  Neurological:     General: No focal deficit present.     Mental Status: She is alert and oriented to person, place, and time. Mental status is at baseline.     Comments: Cranial nerves II through XII intact, strength 5 out of 5 in the bilateral upper and lower extremities, no sensory deficit to light touch, no dysmetria on finger-nose-finger testing, ambulatory with steady gait.   Psychiatric:        Mood and Affect: Mood normal.        Behavior: Behavior normal.     ED Results and Treatments Labs (all labs ordered are listed, but only abnormal results are displayed) Labs Reviewed  URINALYSIS, ROUTINE W REFLEX MICROSCOPIC - Abnormal; Notable for the following components:      Result Value   Ketones, ur 15 (*)    All other components within normal limits  BASIC METABOLIC PANEL - Abnormal; Notable for the following components:   BUN 33 (*)    Creatinine, Ser 2.10 (*)     GFR, Estimated 22 (*)    All other components within normal limits  CBC WITH DIFFERENTIAL/PLATELET - Abnormal; Notable for the following components:   RBC 3.45 (*)    Hemoglobin 10.0 (*)    HCT 31.7 (*)    All other components within normal limits  HEPATIC FUNCTION PANEL - Abnormal; Notable for the following components:   Total Protein 6.0 (*)    Albumin 3.2 (*)    All other components within normal limits  SALICYLATE LEVEL - Abnormal; Notable for the following components:   Salicylate Lvl <7.0 (*)    All other components within normal limits  BASIC METABOLIC PANEL - Abnormal; Notable for the following components:   Glucose, Bld 58 (*)    BUN 27 (*)    Creatinine, Ser 1.66 (*)    Calcium 8.7 (*)    GFR, Estimated 29 (*)    All other components within normal limits  CBC WITH DIFFERENTIAL/PLATELET - Abnormal; Notable for the following components:   WBC 3.7 (*)    RBC 3.20 (*)    Hemoglobin 9.4 (*)    HCT 29.6 (*)    All other components within normal limits  CBG MONITORING, ED - Abnormal; Notable for the following components:   Glucose-Capillary 61 (*)    All other components within normal limits  CBG MONITORING, ED - Abnormal; Notable for the following components:  Glucose-Capillary 109 (*)    All other components within normal limits  CBG MONITORING, ED - Abnormal; Notable for the following components:   Glucose-Capillary 124 (*)    All other components within normal limits  LIPASE, BLOOD  ACETAMINOPHEN LEVEL  AMMONIA  TSH                                                                                                                          Radiology CT Head Wo Contrast  Result Date: 03/13/2022 CLINICAL DATA:  Follow-up left parafalcine subdural hematoma. EXAM: CT HEAD WITHOUT CONTRAST TECHNIQUE: Contiguous axial images were obtained from the base of the skull through the vertex without intravenous contrast. RADIATION DOSE REDUCTION: This exam was performed according to  the departmental dose-optimization program which includes automated exposure control, adjustment of the mA and/or kV according to patient size and/or use of iterative reconstruction technique. COMPARISON:  Head CT yesterday at 8:18 p.m., MRI brain 03/04/2022, head CT 02/28/2022. FINDINGS: Current study images completed 03/13/2022 at 4:03 a.m. Brain: Again noted is a small 3 mm in thickness hyperdense posterior right parafalcine subdural bleed, unchanged. Additional small subdural bleed extends over the left leaf of the tentorium measuring 2 mm thickness. No new or increased hemorrhage is seen. Unchanged findings of mild atrophy, moderately developed small-vessel disease and mild atrophic ventriculomegaly. There is no midline shift. Left lateral frontal lobe subacute cortical infarct is again noted in the prior location of the cortical contusion noted previously. No hyperdense bleed is seen in this area today. The cerebral and cerebellar hemispheres are otherwise unremarkable. There is no abnormality in the basal cisterns. Vascular: There are calcifications of the carotid siphons but no hyperdense central vessels. Skull: Swelling in the lateral left orbitofrontal area is again noted. Negative for skull fracture or focal lesion. Sinuses/Orbits: Visualized sinuses and mastoid air cells are clear. Other: None. IMPRESSION: 1. Small posterior right parafalcine and left tentorial subdural bleed, unchanged. 2. No parenchymal hemorrhage is seen. 3. Subacute lateral left frontal cortical infarct 4. No mass effect.  No further hemorrhage. Electronically Signed   By: Telford Nab M.D.   On: 03/13/2022 04:45   CT Head Wo Contrast  Result Date: 03/12/2022 CLINICAL DATA:  Mental status change, unknown cause EXAM: CT HEAD WITHOUT CONTRAST TECHNIQUE: Contiguous axial images were obtained from the base of the skull through the vertex without intravenous contrast. RADIATION DOSE REDUCTION: This exam was performed according to  the departmental dose-optimization program which includes automated exposure control, adjustment of the mA and/or kV according to patient size and/or use of iterative reconstruction technique. COMPARISON:  02/28/2022 FINDINGS: Brain: Small amount of subdural blood noted along the posterior falx. No intraparenchymal hemorrhage. No mass effect or midline shift. No hydrocephalus. There is atrophy and chronic small vessel disease changes. Vascular: No hyperdense vessel or unexpected calcification. Skull: No acute calvarial abnormality. Sinuses/Orbits: No acute findings Other: Soft tissue swelling over the left lateral orbit and forehead. IMPRESSION: Small amount of  subdural blood along the posterior falx. No mass effect or midline shift. No intraparenchymal hemorrhage. These results were called by telephone at the time of interpretation on 03/12/2022 at 9:27 pm to provider Garnette Gunner , who verbally acknowledged these results. Electronically Signed   By: Rolm Baptise M.D.   On: 03/12/2022 21:32    Pertinent labs & imaging results that were available during my care of the patient were reviewed by me and considered in my medical decision making (see MDM for details).  Medications Ordered in ED Medications  carboxymethylcellulose (REFRESH PLUS) 0.5 % ophthalmic solution 1 drop (1 drop Both Eyes Not Given 03/13/22 1118)  docusate sodium (COLACE) capsule 100 mg (has no administration in time range)  escitalopram (LEXAPRO) tablet 10 mg (10 mg Oral Not Given 03/13/22 1119)  famotidine (PEPCID) tablet 20 mg (20 mg Oral Given 03/13/22 1026)  fexofenadine (ALLEGRA) tablet 60 mg (60 mg Oral Not Given 03/13/22 1119)  fluticasone (FLONASE) 50 MCG/ACT nasal spray 1-2 spray (has no administration in time range)  melatonin tablet 3 mg (has no administration in time range)  mirtazapine (REMERON) tablet 30 mg (has no administration in time range)  sodium chloride flush (NS) 0.9 % injection 3 mL (3 mLs Intravenous Given  03/13/22 1026)  sodium chloride flush (NS) 0.9 % injection 3 mL (has no administration in time range)  multivitamin with minerals tablet 1 tablet (1 tablet Oral Given 03/13/22 1026)  acetaminophen (TYLENOL) tablet 1,000 mg (1,000 mg Oral Given 03/13/22 1026)  sodium chloride 0.9 % bolus 1,000 mL (0 mLs Intravenous Stopped 03/12/22 1748)  haloperidol lactate (HALDOL) injection 1 mg (1 mg Intravenous Given 03/12/22 2010)  haloperidol lactate (HALDOL) injection 1 mg (1 mg Intravenous Given 03/12/22 2039)  acetaminophen (TYLENOL) tablet 1,000 mg (1,000 mg Oral Given 03/12/22 2038)  LORazepam (ATIVAN) injection 1 mg (1 mg Intravenous Given 03/12/22 2038)                                                                                                                                     Procedures Procedures  (including critical care time)  Medical Decision Making / ED Course   MDM:  88 year old female presenting to the emergency department with foul-smelling urine.  Patient overall well-appearing, exam nonfocal.  Denies headache.  Has normal neurologic exam and is oriented.  Denies falling since recent hospitalization.  Patient did have a positive UA while hospitalized, ultimately decision was made not to treat.  Reports persistent symptoms.  Will check urinalysis.  Denies any flank pain, fevers or chills to suggest pyelonephritis or nephrolithiasis.  Will check basic labs.  Likely treat as outpatient.    Clinical Course as of 03/13/22 1252  Sun Mar 12, 2022  1918 Urinalysis bland [WS]  2015 On further discussion with the daughter at bedside, the patient has been increasingly depressed recently since her head injury, has been threatening to harm herself by jumping  off the stairs, has been complaining that she does not want to live anymore.  Will consult psychiatry, obtain further labs for altered mental status and obtain repeat head CT. [WS]  2140 CT scan demonstrates new subdural hematoma.  No  clear history of recent falls.  Unclear if this could be causing the patient's new behavioral dysregulation.  May require further inpatient psychiatric work-up [WS]  2317 Discussed subdural hematoma with Dr. Dory Larsen of neurosurgery.  Recommends repeat CT scan in 6 to 8 hours to ensure stability.  Discussed with daughter, she does report that the patient has recently fallen.  Will page medicine for admission. [WS]  2344 Discussed work-up with Dr. Nevada Crane of internal medicine, who agrees with admission.  The patient.  Repeat head CT ordered to ensure stability of SDH.  Given self-harm behaviors and statements, placed on IVC. [WS]    Clinical Course User Index [WS] Cristie Hem, MD     Additional history obtained: -Additional history obtained from family -External records from outside source obtained and reviewed including: Chart review including previous notes, labs, imaging, consultation notes   Lab Tests: -I ordered, reviewed, and interpreted labs.   The pertinent results include:   Labs Reviewed  URINALYSIS, ROUTINE W REFLEX MICROSCOPIC - Abnormal; Notable for the following components:      Result Value   Ketones, ur 15 (*)    All other components within normal limits  BASIC METABOLIC PANEL - Abnormal; Notable for the following components:   BUN 33 (*)    Creatinine, Ser 2.10 (*)    GFR, Estimated 22 (*)    All other components within normal limits  CBC WITH DIFFERENTIAL/PLATELET - Abnormal; Notable for the following components:   RBC 3.45 (*)    Hemoglobin 10.0 (*)    HCT 31.7 (*)    All other components within normal limits  HEPATIC FUNCTION PANEL - Abnormal; Notable for the following components:   Total Protein 6.0 (*)    Albumin 3.2 (*)    All other components within normal limits  SALICYLATE LEVEL - Abnormal; Notable for the following components:   Salicylate Lvl [id number] (*)    All other components within normal limits  BASIC METABOLIC PANEL - Abnormal; Notable for the  following components:   Glucose, Bld 58 (*)    BUN 27 (*)    Creatinine, Ser 1.66 (*)    Calcium 8.7 (*)    GFR, Estimated 29 (*)    All other components within normal limits  CBC WITH DIFFERENTIAL/PLATELET - Abnormal; Notable for the following components:   WBC 3.7 (*)    RBC 3.20 (*)    Hemoglobin 9.4 (*)    HCT 29.6 (*)    All other components within normal limits  CBG MONITORING, ED - Abnormal; Notable for the following components:   Glucose-Capillary 61 (*)    All other components within normal limits  CBG MONITORING, ED - Abnormal; Notable for the following components:   Glucose-Capillary 109 (*)    All other components within normal limits  CBG MONITORING, ED - Abnormal; Notable for the following components:   Glucose-Capillary 124 (*)    All other components within normal limits  LIPASE, BLOOD  ACETAMINOPHEN LEVEL  AMMONIA  TSH      EKG   EKG Interpretation  Date/Time:    Ventricular Rate:    PR Interval:    QRS Duration:   QT Interval:    QTC Calculation:   R Axis:  Text Interpretation:           Imaging Studies ordered: I ordered imaging studies including CT brain On my interpretation imaging demonstrates new trace SDH I independently visualized and interpreted imaging. I agree with the radiologist interpretation   Medicines ordered and prescription drug management: Meds ordered this encounter  Medications   sodium chloride 0.9 % bolus 1,000 mL   haloperidol lactate (HALDOL) injection 1 mg   haloperidol lactate (HALDOL) injection 1 mg   acetaminophen (TYLENOL) tablet 1,000 mg   LORazepam (ATIVAN) injection 1 mg   DISCONTD: acetaminophen (TYLENOL) tablet 1,000 mg   carboxymethylcellulose (REFRESH PLUS) 0.5 % ophthalmic solution 1 drop   docusate sodium (COLACE) capsule 100 mg   escitalopram (LEXAPRO) tablet 10 mg   famotidine (PEPCID) tablet 20 mg   fexofenadine (ALLEGRA) tablet 60 mg   fluticasone (FLONASE) 50 MCG/ACT nasal spray 1-2  spray   melatonin tablet 3 mg   mirtazapine (REMERON) tablet 30 mg   DISCONTD: Multivitamin Adult CHEW 1 Dose   sodium chloride flush (NS) 0.9 % injection 3 mL   sodium chloride flush (NS) 0.9 % injection 3 mL   multivitamin with minerals tablet 1 tablet   acetaminophen (TYLENOL) tablet 1,000 mg    -I have reviewed the patients home medicines and have made adjustments as needed   Consultations Obtained: I requested consultation with the neurosurgeon,  and discussed lab and imaging findings as well as pertinent plan - they recommend: repeat CT in 6-8 hrs   Cardiac Monitoring: The patient was maintained on a cardiac monitor.  I personally viewed and interpreted the cardiac monitored which showed an underlying rhythm of: NSR  Social Determinants of Health:  Factors impacting patients care include: former smoker   Reevaluation: After the interventions noted above, I reevaluated the patient and found that they have improved  Co morbidities that complicate the patient evaluation  Past Medical History:  Diagnosis Date   Allergic rhinitis    ASCUS (atypical squamous cells of undetermined significance) on Pap smear    Atrophic vaginitis    Chronic insomnia    Depression    GERD (gastroesophageal reflux disease)    Goiter    History of kidney stones    Hypercholesteremia    Hypertension    LGSIL (low grade squamous intraepithelial dysplasia)    Osteopenia    Ovarian cyst    "shrank it"   Pancreatitis    Scoliosis    Spinal arthritis       Dispostion: Admit    Final Clinical Impression(s) / ED Diagnoses Final diagnoses:  Suicidal ideation  Subdural hematoma ([place])     This chart was dictated using voice recognition software.  Despite best efforts to proofread,  errors can occur which can change the documentation meaning.    Cristie Hem, MD 03/12/22 2345    Cristie Hem, MD 03/13/22 1252

## 2022-03-13 ENCOUNTER — Emergency Department (HOSPITAL_BASED_OUTPATIENT_CLINIC_OR_DEPARTMENT_OTHER): Payer: Medicare Other

## 2022-03-13 ENCOUNTER — Telehealth: Payer: Self-pay | Admitting: *Deleted

## 2022-03-13 DIAGNOSIS — Z87891 Personal history of nicotine dependence: Secondary | ICD-10-CM | POA: Diagnosis not present

## 2022-03-13 DIAGNOSIS — E785 Hyperlipidemia, unspecified: Secondary | ICD-10-CM

## 2022-03-13 DIAGNOSIS — R45851 Suicidal ideations: Secondary | ICD-10-CM

## 2022-03-13 DIAGNOSIS — F419 Anxiety disorder, unspecified: Secondary | ICD-10-CM | POA: Diagnosis present

## 2022-03-13 DIAGNOSIS — E7800 Pure hypercholesterolemia, unspecified: Secondary | ICD-10-CM | POA: Diagnosis present

## 2022-03-13 DIAGNOSIS — F32A Depression, unspecified: Secondary | ICD-10-CM | POA: Diagnosis not present

## 2022-03-13 DIAGNOSIS — G9341 Metabolic encephalopathy: Secondary | ICD-10-CM | POA: Diagnosis present

## 2022-03-13 DIAGNOSIS — R4182 Altered mental status, unspecified: Secondary | ICD-10-CM | POA: Diagnosis not present

## 2022-03-13 DIAGNOSIS — F05 Delirium due to known physiological condition: Secondary | ICD-10-CM | POA: Diagnosis not present

## 2022-03-13 DIAGNOSIS — S065XAA Traumatic subdural hemorrhage with loss of consciousness status unknown, initial encounter: Principal | ICD-10-CM

## 2022-03-13 DIAGNOSIS — F4321 Adjustment disorder with depressed mood: Secondary | ICD-10-CM | POA: Diagnosis present

## 2022-03-13 DIAGNOSIS — R531 Weakness: Secondary | ICD-10-CM

## 2022-03-13 DIAGNOSIS — F03918 Unspecified dementia, unspecified severity, with other behavioral disturbance: Secondary | ICD-10-CM | POA: Diagnosis present

## 2022-03-13 DIAGNOSIS — R296 Repeated falls: Secondary | ICD-10-CM | POA: Diagnosis not present

## 2022-03-13 DIAGNOSIS — J309 Allergic rhinitis, unspecified: Secondary | ICD-10-CM | POA: Diagnosis present

## 2022-03-13 DIAGNOSIS — D631 Anemia in chronic kidney disease: Secondary | ICD-10-CM | POA: Diagnosis present

## 2022-03-13 DIAGNOSIS — D509 Iron deficiency anemia, unspecified: Secondary | ICD-10-CM | POA: Diagnosis present

## 2022-03-13 DIAGNOSIS — K219 Gastro-esophageal reflux disease without esophagitis: Secondary | ICD-10-CM

## 2022-03-13 DIAGNOSIS — N179 Acute kidney failure, unspecified: Secondary | ICD-10-CM | POA: Diagnosis present

## 2022-03-13 DIAGNOSIS — G9340 Encephalopathy, unspecified: Secondary | ICD-10-CM | POA: Diagnosis not present

## 2022-03-13 DIAGNOSIS — Z79899 Other long term (current) drug therapy: Secondary | ICD-10-CM | POA: Diagnosis not present

## 2022-03-13 DIAGNOSIS — Z96652 Presence of left artificial knee joint: Secondary | ICD-10-CM | POA: Diagnosis present

## 2022-03-13 DIAGNOSIS — N1831 Chronic kidney disease, stage 3a: Secondary | ICD-10-CM | POA: Diagnosis present

## 2022-03-13 DIAGNOSIS — Z8249 Family history of ischemic heart disease and other diseases of the circulatory system: Secondary | ICD-10-CM | POA: Diagnosis not present

## 2022-03-13 DIAGNOSIS — R911 Solitary pulmonary nodule: Secondary | ICD-10-CM | POA: Diagnosis present

## 2022-03-13 DIAGNOSIS — R5381 Other malaise: Secondary | ICD-10-CM | POA: Diagnosis present

## 2022-03-13 DIAGNOSIS — F5104 Psychophysiologic insomnia: Secondary | ICD-10-CM | POA: Diagnosis present

## 2022-03-13 DIAGNOSIS — R569 Unspecified convulsions: Secondary | ICD-10-CM | POA: Diagnosis not present

## 2022-03-13 DIAGNOSIS — W010XXA Fall on same level from slipping, tripping and stumbling without subsequent striking against object, initial encounter: Secondary | ICD-10-CM | POA: Diagnosis present

## 2022-03-13 DIAGNOSIS — I129 Hypertensive chronic kidney disease with stage 1 through stage 4 chronic kidney disease, or unspecified chronic kidney disease: Secondary | ICD-10-CM | POA: Diagnosis present

## 2022-03-13 LAB — CBG MONITORING, ED
Glucose-Capillary: 109 mg/dL — ABNORMAL HIGH (ref 70–99)
Glucose-Capillary: 61 mg/dL — ABNORMAL LOW (ref 70–99)
Glucose-Capillary: 124 mg/dL — ABNORMAL HIGH (ref 70–99)
Glucose-Capillary: 83 mg/dL (ref 70–99)
Glucose-Capillary: 84 mg/dL (ref 70–99)

## 2022-03-13 LAB — BASIC METABOLIC PANEL
Anion gap: 5 (ref 5–15)
BUN: 27 mg/dL — ABNORMAL HIGH (ref 8–23)
CO2: 24 mmol/L (ref 22–32)
Calcium: 8.7 mg/dL — ABNORMAL LOW (ref 8.9–10.3)
Chloride: 111 mmol/L (ref 98–111)
Creatinine, Ser: 1.66 mg/dL — ABNORMAL HIGH (ref 0.44–1.00)
GFR, Estimated: 29 mL/min — ABNORMAL LOW (ref >60)
Glucose, Bld: 58 mg/dL — ABNORMAL LOW (ref 70–99)
Potassium: 4.3 mmol/L (ref 3.5–5.1)
Sodium: 140 mmol/L (ref 135–145)

## 2022-03-13 LAB — CBC WITH DIFFERENTIAL/PLATELET
Abs Immature Granulocytes: 0.04 10*3/uL (ref 0.00–0.07)
Basophils Absolute: 0 10*3/uL (ref 0.0–0.1)
Basophils Relative: 1 %
Eosinophils Absolute: 0.1 10*3/uL (ref 0.0–0.5)
Eosinophils Relative: 4 %
HCT: 29.6 % — ABNORMAL LOW (ref 36.0–46.0)
Hemoglobin: 9.4 g/dL — ABNORMAL LOW (ref 12.0–15.0)
Immature Granulocytes: 1 %
Lymphocytes Relative: 31 %
Lymphs Abs: 1.1 10*3/uL (ref 0.7–4.0)
MCH: 29.4 pg (ref 26.0–34.0)
MCHC: 31.8 g/dL (ref 30.0–36.0)
MCV: 92.5 fL (ref 80.0–100.0)
Monocytes Absolute: 0.5 10*3/uL (ref 0.1–1.0)
Monocytes Relative: 13 %
Neutro Abs: 1.9 10*3/uL (ref 1.7–7.7)
Neutrophils Relative %: 50 %
Platelets: 196 10*3/uL (ref 150–400)
RBC: 3.2 MIL/uL — ABNORMAL LOW (ref 3.87–5.11)
RDW: 15.3 % (ref 11.5–15.5)
WBC: 3.7 10*3/uL — ABNORMAL LOW (ref 4.0–10.5)
nRBC: 0 % (ref 0.0–0.2)

## 2022-03-13 LAB — TSH: TSH: 3.261 u[IU]/mL (ref 0.350–4.500)

## 2022-03-13 MED ORDER — DOCUSATE SODIUM 100 MG PO CAPS: 100.0000 mg | ORAL_CAPSULE | Freq: Every day | ORAL | Status: DC | PRN

## 2022-03-13 MED ORDER — FLUTICASONE PROPIONATE 50 MCG/ACT NA SUSP: 1.0000 | Freq: Every day | NASAL | Status: DC | PRN

## 2022-03-13 MED ORDER — ACETAMINOPHEN 500 MG PO TABS: 1000.0000 mg | ORAL_TABLET | ORAL | Status: DC

## 2022-03-13 MED ORDER — SODIUM CHLORIDE 0.9% FLUSH
3.0000 mL | Freq: Two times a day (BID) | INTRAVENOUS | Status: DC
  Administered 2022-03-13 – 2022-03-31 (×23): 3 mL via INTRAVENOUS
  Filled 2022-03-13: qty 3

## 2022-03-13 MED ORDER — POLYVINYL ALCOHOL 1.4 % OP SOLN
1.0000 [drp] | Freq: Three times a day (TID) | OPHTHALMIC | Status: DC
  Administered 2022-03-13 – 2022-03-31 (×37): 1 [drp] via OPHTHALMIC
  Filled 2022-03-13 (×3): qty 15

## 2022-03-13 MED ORDER — FEXOFENADINE HCL 60 MG PO TABS: 60.0000 mg | ORAL_TABLET | Freq: Every day | ORAL | Status: DC

## 2022-03-13 MED ORDER — ESCITALOPRAM OXALATE 10 MG PO TABS: 10.0000 mg | ORAL_TABLET | Freq: Every day | ORAL | Status: DC

## 2022-03-13 MED ORDER — MELATONIN 3 MG PO TABS
3.0000 mg | ORAL_TABLET | Freq: Every evening | ORAL | Status: DC | PRN
  Administered 2022-03-15 – 2022-03-21 (×5): 3 mg via ORAL
  Filled 2022-03-13 (×5): qty 1

## 2022-03-13 MED ORDER — FAMOTIDINE 20 MG PO TABS
20.0000 mg | ORAL_TABLET | Freq: Two times a day (BID) | ORAL | Status: DC
  Administered 2022-03-13 – 2022-03-15 (×5): 20 mg via ORAL
  Filled 2022-03-13 (×5): qty 1

## 2022-03-13 MED ORDER — ACETAMINOPHEN 325 MG PO TABS
650.0000 mg | ORAL_TABLET | Freq: Four times a day (QID) | ORAL | Status: DC | PRN
  Administered 2022-03-27: 650 mg via ORAL
  Filled 2022-03-13: qty 2

## 2022-03-13 MED ORDER — SODIUM CHLORIDE 0.9% FLUSH
3.0000 mL | INTRAVENOUS | Status: DC | PRN
  Administered 2022-03-19: 3 mL via INTRAVENOUS
  Filled 2022-03-13: qty 3

## 2022-03-13 MED ORDER — ADULT MULTIVITAMIN W/MINERALS CH
1.0000 | ORAL_TABLET | Freq: Every day | ORAL | Status: DC
  Administered 2022-03-13 – 2022-03-31 (×19): 1 via ORAL
  Filled 2022-03-13 (×21): qty 1

## 2022-03-13 MED ORDER — ACETAMINOPHEN 500 MG PO TABS
1000.0000 mg | ORAL_TABLET | Freq: Two times a day (BID) | ORAL | Status: DC
  Administered 2022-03-13 – 2022-03-31 (×34): 1000 mg via ORAL
  Filled 2022-03-13 (×36): qty 2

## 2022-03-13 MED ORDER — MIRTAZAPINE 30 MG PO TABS
30.0000 mg | ORAL_TABLET | Freq: Every day | ORAL | Status: DC
  Filled 2022-03-13: qty 1

## 2022-03-13 MED ORDER — MELATONIN 3 MG PO TABS: 3.0000 mg | ORAL_TABLET | Freq: Every day | ORAL | Status: DC

## 2022-03-13 MED ORDER — MULTIVITAMIN ADULT PO CHEW: 1.0000 | CHEWABLE_TABLET | Freq: Every day | ORAL | Status: DC

## 2022-03-13 MED ORDER — ACETAMINOPHEN 650 MG RE SUPP: 650.0000 mg | Freq: Four times a day (QID) | RECTAL | Status: DC | PRN

## 2022-03-13 NOTE — ED Notes (Signed)
Pt ate 2 graham crackers and drank 12 oz of Orange juice. Will recheck CBG in 1 hour.

## 2022-03-13 NOTE — ED Notes (Addendum)
Attempted to call daughter Byrd Hesselbach for updates, no answer. Left voicemail.

## 2022-03-13 NOTE — ED Notes (Signed)
Patient IVC'd due to altered mental status . Patient placed on 1:1 observation , safety observation . Patient is currently asleep in no acute distress. All safety measures met.

## 2022-03-13 NOTE — H&P (Signed)
History and Physical    PLEASE NOTE THAT DRAGON DICTATION SOFTWARE WAS USED IN THE CONSTRUCTION OF THIS NOTE.   ACELYN SUPINO [id number] DOB: [birth year] DOA: 03/12/2022  PCP: Vincente Liberty, MD *** Patient coming from: home ***  I have personally briefly reviewed patient's old medical records in [place]  Chief Complaint: ***  HPI: Nancy Suarez is a 88 y.o. female with medical history significant for *** who is admitted to [facility] on 03/12/2022 with *** after presenting from home*** to [facility] ED complaining of ***.    ***    ***SOB: Denies any associated orthopnea, PND, or new onset peripheral edema. No recent chest pain, diaphoresis, palpitations, N/V, pre-syncope, or syncope. Not associated with any recent cough, wheezing, hemoptysis, new lower extremity erythema, or calf tenderness. Denies any recent trauma, travel, surgical procedures, or periods of prolonged diminished ambulatory status. No recent melena or hematochezia.   Denies any associated subjective fever, chills, rigors, or generalized myalgias. No recent headache, neck stiffness, rhinitis, rhinorrhea, sore throat, abdominal pain, diarrhea, or rash. No known recent COVID-19 exposures. Denies dysuria, gross hematuria, or change in urinary urgency/frequency.  ***   ***misc/infectious: Denies any subjective fever, chills, rigors, or generalized myalgias. Denies any recent headache, neck stiffness, rhinitis, rhinorrhea, sore throat, sob, wheezing, cough, nausea, vomiting, abdominal pain, diarrhea, or rash. No recent traveling or known COVID-19 exposures. Denies dysuria, gross hematuria, or change in urinary urgency/frequency.  Denies any recent chest pain, diaphoresis, or palpitations. ***    ED Course:  Vital signs in the ED were notable for the following: ***  Labs were notable for the following: ***  Imaging and additional notable ED work-up: ***  While in the ED, the following were  administered: ***  Subsequently, the patient was admitted  ***  ***red    Review of Systems: As per HPI otherwise 10 point review of systems negative.   Past Medical History:  Diagnosis Date   Allergic rhinitis    ASCUS (atypical squamous cells of undetermined significance) on Pap smear    Atrophic vaginitis    Chronic insomnia    Depression    GERD (gastroesophageal reflux disease)    Goiter    History of kidney stones    Hypercholesteremia    Hypertension    LGSIL (low grade squamous intraepithelial dysplasia)    Osteopenia    Ovarian cyst    "shrank it"   Pancreatitis    Scoliosis    Spinal arthritis     Past Surgical History:  Procedure Laterality Date   ABDOMINAL HYSTERECTOMY  1977   partial   INTRAMEDULLARY (IM) NAIL INTERTROCHANTERIC Right 11/28/2021   Procedure: INTRAMEDULLARY (IM) NAIL INTERTROCHANTRIC;  Surgeon: Rod Can, MD;  Location: [facility];  Service: Orthopedics;  Laterality: Right;   KIDNEY STONE SURGERY  1990's   2-3 stones x1 surgery   ORIF FEMUR FRACTURE Left 06/09/2017   Procedure: OPEN REDUCTION INTERNAL FIXATION (ORIF) DISTAL FEMUR FRACTURE;  Surgeon: Paralee Cancel, MD;  Location: [facility];  Service: Orthopedics;  Laterality: Left;   TONSILLECTOMY  as teenager   TOTAL KNEE ARTHROPLASTY Left 01/19/2014   Procedure: LEFT TOTAL KNEE ARTHROPLASTY;  Surgeon: Mauri Pole, MD;  Location: [facility];  Service: Orthopedics;  Laterality: Left;   WISDOM TOOTH EXTRACTION      Social History:  reports that she quit smoking about 43 years ago. Her smoking use included cigarettes. She has quit using smokeless tobacco. She reports current alcohol use. She reports  that she does not use drugs.   Allergies  Allergen Reactions   Ambien [Zolpidem] Other (See Comments)    Disorientation Aggression    Aspirin Other (See Comments)    Upsets stomach if 325mg    Codeine Nausea And Vomiting   Namenda [Memantine] Other (See Comments)    Agitation Aggression     Percocet [Oxycodone-Acetaminophen] Other (See Comments)    Disorientation Aggression     Sulfa Antibiotics Nausea Only    Family History  Problem Relation Age of Onset   Hypertension Father    Cancer Father    Cancer Mother        Bone   Cancer Brother     Family history reviewed and not pertinent ***   Prior to Admission medications   Medication Sig Start Date End Date Taking? Authorizing Provider  acetaminophen (TYLENOL) 500 MG tablet Take 2 tablets (1,000 mg total) by mouth 2 (two) times daily. Patient taking differently: Take 1,000 mg by mouth See admin instructions. 1000 mg twice daily (morning and evening) + an additional 500 mg during the day as needed for pain. 12/05/21  Yes 02/04/22, MD  escitalopram (LEXAPRO) 10 MG tablet Take 10 mg by mouth daily.   Yes [provider]  famotidine (PEPCID) 20 MG tablet Take 20 mg by mouth 2 (two) times daily.   Yes [provider]  fluticasone (FLONASE) 50 MCG/ACT nasal spray Place 1-2 sprays into both nostrils daily as needed for allergies.   Yes [provider]  atorvastatin (LIPITOR) 20 MG tablet Take 20 mg by mouth daily. 09/13/21   [provider]  carboxymethylcellulose (REFRESH PLUS) 0.5 % SOLN Place 1 drop into both eyes in the morning, at noon, and at bedtime.    [provider]  docusate sodium (COLACE) 100 MG capsule Take 100 mg by mouth daily as needed for mild constipation.    [provider]  Fexofenadine HCl (ALLEGRA ALLERGY PO) Take 1 tablet by mouth daily.    [provider]  MELATONIN PO Take 2 tablets by mouth at bedtime.    [provider]  Menthol, Topical Analgesic, (BIOFREEZE EX) Apply 1 application  topically 2 (two) times daily as needed (knee pain).    [provider]  mirtazapine (REMERON) 30 MG tablet Take 1 tablet (30 mg total) by mouth at bedtime. Patient taking differently: Take 30 mg by mouth See admin instructions. 15 mg  around 14:00, 15 mg around 20:00-21:00 02/14/22   02/16/22, NP  Multiple Vitamins-Minerals (MULTIVITAMIN ADULT) CHEW Chew 1 Dose by mouth daily.    [provider]     Objective    Physical Exam: Vitals:   03/13/22 1600 03/13/22 1900 03/13/22 1923 03/13/22 2000  BP: (!) 145/64 132/66  (!) 151/63  Pulse: 60 62  62  Resp: 16 14    Temp:   98.3 F (36.8 C)   TempSrc:   Oral   SpO2: 100% 99%  100%  Weight:      Height:        General: appears to be stated age; alert, oriented Skin: warm, dry, no rash Head:  AT/[place] Mouth:  Oral mucosa membranes appear moist, normal dentition Neck: supple; trachea midline Heart:  RRR; did not appreciate any M/R/G Lungs: CTAB, did not appreciate any wheezes, rales, or rhonchi Abdomen: + BS; soft, ND, NT Vascular: 2+ pedal pulses b/l; 2+ radial pulses b/l Extremities: no peripheral edema, no muscle wasting Neuro: strength and sensation intact  in upper and lower extremities b/l ***   *** Neuro: 5/5 strength of the proximal and distal flexors and extensors of the upper and lower extremities bilaterally; sensation intact in upper and lower extremities b/l; cranial nerves II through XII grossly intact; no pronator drift; no evidence suggestive of slurred speech, dysarthria, or facial droop; Normal muscle tone. No tremors.  *** Neuro: In the setting of the patient's current mental status and associated inability to follow instructions, unable to perform full neurologic exam at this time.  As such, assessment of strength, sensation, and cranial nerves is limited at this time. Patient noted to spontaneously move all 4 extremities. No tremors.  ***    Labs on Admission: I have personally reviewed following labs and imaging studies  CBC: Recent Labs  Lab 03/12/22 1530 03/13/22 0757  WBC 4.5 3.7*  NEUTROABS 2.9 1.9  HGB 10.0* 9.4*  HCT 31.7* 29.6*  MCV 91.9 92.5  PLT 222 [id number]   Basic Metabolic Panel: Recent Labs  Lab  03/12/22 1530 03/13/22 0757  NA 139 140  K 4.7 4.3  CL 106 111  CO2 24 24  GLUCOSE 80 58*  BUN 33* 27*  CREATININE 2.10* 1.66*  CALCIUM 9.3 8.7*   GFR: Estimated Creatinine Clearance: 21.1 mL/min (A) (by C-G formula based on SCr of 1.66 mg/dL (H)). Liver Function Tests: Recent Labs  Lab 03/12/22 2158  AST 19  ALT 14  ALKPHOS 72  BILITOT 1.0  PROT 6.0*  ALBUMIN 3.2*   Recent Labs  Lab 03/12/22 2158  LIPASE 46   Recent Labs  Lab 03/12/22 2158  AMMONIA <10   Coagulation Profile: No results for input(s): "INR", "PROTIME" in the last 168 hours. Cardiac Enzymes: No results for input(s): "CKTOTAL", "CKMB", "CKMBINDEX", "TROPONINI" in the last 168 hours. BNP (last 3 results) No results for input(s): "PROBNP" in the last 8760 hours. HbA1C: No results for input(s): "HGBA1C" in the last 72 hours. CBG: Recent Labs  Lab 03/13/22 0934 03/13/22 1014 03/13/22 1124 03/13/22 1423 03/13/22 1752  GLUCAP 61* 109* 124* 83 84   Lipid Profile: No results for input(s): "CHOL", "HDL", "LDLCALC", "TRIG", "CHOLHDL", "LDLDIRECT" in the last 72 hours. Thyroid Function Tests: Recent Labs    03/12/22 0037  TSH 3.261   Anemia Panel: No results for input(s): "VITAMINB12", "FOLATE", "FERRITIN", "TIBC", "IRON", "RETICCTPCT" in the last 72 hours. Urine analysis:    Component Value Date/Time   COLORURINE YELLOW 03/12/2022 1905   APPEARANCEUR CLEAR 03/12/2022 1905   LABSPEC 1.020 03/12/2022 1905   PHURINE 7.0 03/12/2022 1905   GLUCOSEU NEGATIVE 03/12/2022 1905   HGBUR NEGATIVE 03/12/2022 1905   BILIRUBINUR NEGATIVE 03/12/2022 1905   KETONESUR 15 (A) 03/12/2022 1905   PROTEINUR NEGATIVE 03/12/2022 1905   UROBILINOGEN 0.2 01/12/2014 1537   NITRITE NEGATIVE 03/12/2022 1905   LEUKOCYTESUR NEGATIVE 03/12/2022 1905    Radiological Exams on Admission: CT Head Wo Contrast  Result Date: 03/13/2022 CLINICAL DATA:  Follow-up left parafalcine subdural hematoma. EXAM: CT HEAD WITHOUT  CONTRAST TECHNIQUE: Contiguous axial images were obtained from the base of the skull through the vertex without intravenous contrast. RADIATION DOSE REDUCTION: This exam was performed according to the departmental dose-optimization program which includes automated exposure control, adjustment of the mA and/or kV according to patient size and/or use of iterative reconstruction technique. COMPARISON:  Head CT yesterday at 8:18 p.m., MRI brain 03/04/2022, head CT 02/28/2022. FINDINGS: Current study images completed 03/13/2022 at 4:03 a.m. Brain: Again noted is a small 3 mm in  thickness hyperdense posterior right parafalcine subdural bleed, unchanged. Additional small subdural bleed extends over the left leaf of the tentorium measuring 2 mm thickness. No new or increased hemorrhage is seen. Unchanged findings of mild atrophy, moderately developed small-vessel disease and mild atrophic ventriculomegaly. There is no midline shift. Left lateral frontal lobe subacute cortical infarct is again noted in the prior location of the cortical contusion noted previously. No hyperdense bleed is seen in this area today. The cerebral and cerebellar hemispheres are otherwise unremarkable. There is no abnormality in the basal cisterns. Vascular: There are calcifications of the carotid siphons but no hyperdense central vessels. Skull: Swelling in the lateral left orbitofrontal area is again noted. Negative for skull fracture or focal lesion. Sinuses/Orbits: Visualized sinuses and mastoid air cells are clear. Other: None. IMPRESSION: 1. Small posterior right parafalcine and left tentorial subdural bleed, unchanged. 2. No parenchymal hemorrhage is seen. 3. Subacute lateral left frontal cortical infarct 4. No mass effect.  No further hemorrhage. Electronically Signed   By: Almira Bar M.D.   On: 03/13/2022 04:45   CT Head Wo Contrast  Result Date: 03/12/2022 CLINICAL DATA:  Mental status change, unknown cause EXAM: CT HEAD WITHOUT  CONTRAST TECHNIQUE: Contiguous axial images were obtained from the base of the skull through the vertex without intravenous contrast. RADIATION DOSE REDUCTION: This exam was performed according to the departmental dose-optimization program which includes automated exposure control, adjustment of the mA and/or kV according to patient size and/or use of iterative reconstruction technique. COMPARISON:  02/28/2022 FINDINGS: Brain: Small amount of subdural blood noted along the posterior falx. No intraparenchymal hemorrhage. No mass effect or midline shift. No hydrocephalus. There is atrophy and chronic small vessel disease changes. Vascular: No hyperdense vessel or unexpected calcification. Skull: No acute calvarial abnormality. Sinuses/Orbits: No acute findings Other: Soft tissue swelling over the left lateral orbit and forehead. IMPRESSION: Small amount of subdural blood along the posterior falx. No mass effect or midline shift. No intraparenchymal hemorrhage. These results were called by telephone at the time of interpretation on 03/12/2022 at 9:27 pm to provider Alvino Blood , who verbally acknowledged these results. Electronically Signed   By: Charlett Nose M.D.   On: 03/12/2022 21:32     EKG: Independently reviewed, with result as described above. ***   Assessment/Plan   Principal Problem:   Subdural hematoma (HCC)   ***       ***                   ***  DVT prophylaxis: SCD's ***  Code Status: Full code*** Family Communication: none*** Disposition Plan: Per Rounding Team Consults called: none***;  Admission status: ***    PLEASE NOTE THAT DRAGON DICTATION SOFTWARE WAS USED IN THE CONSTRUCTION OF THIS NOTE.   Chaney Born Shadrack Brummitt DO [facility]  From 7PM - 7AM   03/13/2022, 11:44 PM   ***

## 2022-03-13 NOTE — ED Provider Notes (Signed)
Patient is still boarding in the emergency department waiting for admission.  Labs and CT work-up have been reviewed.  Patient is easily able to awaken on exam.  Her glucose this morning on recheck was 58 so she was given orange juice and crackers.  We will recheck CBG and monitor but otherwise she appears stable.  Home meds have been ordered.   Pricilla Loveless, MD 03/13/22 [phone]

## 2022-03-13 NOTE — ED Notes (Signed)
Patient sent for 2nd head CT in no acute distress.

## 2022-03-13 NOTE — ED Notes (Signed)
Pt was upset and crying - wanted to speak with daughter - daughter was called and on the way at this time

## 2022-03-13 NOTE — ED Notes (Addendum)
Family member here to feed pt. Pt daughter brought her a meal from [employer].

## 2022-03-13 NOTE — ED Notes (Signed)
Pt drank 8 oz of orange juice and ate 3 and a half graham crackers with two containers of peanut butter.

## 2022-03-13 NOTE — ED Notes (Signed)
Pt requested a ginger ale to drink and pt given the same.

## 2022-03-13 NOTE — Patient Outreach (Signed)
  Care Coordination   03/13/2022 Name: Nancy Suarez MRN: [id number] DOB: 09-Jan-1932   Care Coordination Outreach Attempts:  A third unsuccessful outreach was attempted today to offer the patient with information about available care coordination services as a benefit of their health plan.  Per EMR, pt in ED 03/12/22 awaiting admission.  Follow Up Plan:  Additional outreach attempts will be made to offer the patient care coordination information and services.   Encounter Outcome:  No Answer  Care Coordination Interventions Activated:  No   Care Coordination Interventions:  No, not indicated    Irving Shows [facility], BSN [facility] RN Care Coordinator [phone]

## 2022-03-13 NOTE — BH Assessment (Signed)
Received TTS consult. Confirmed with RN that Pt is being medically admitted.   Pamalee Leyden, [facility], [facility] Triage Specialist [phone]

## 2022-03-13 NOTE — ED Notes (Signed)
Patient is asleep in no acute distress breathing spontaneously to room air , saturating at 97%. On continuous cardiac monitoring .

## 2022-03-13 NOTE — ED Notes (Signed)
Rounded on pt, eyes closed, VSS on RA. 1:1 sitter at bedside. Per prior nurse, pt has been sleeping since she gave Ativan last night.

## 2022-03-14 ENCOUNTER — Inpatient Hospital Stay (HOSPITAL_COMMUNITY): Payer: Medicare Other

## 2022-03-14 ENCOUNTER — Encounter (HOSPITAL_COMMUNITY): Payer: Self-pay | Admitting: Internal Medicine

## 2022-03-14 DIAGNOSIS — R531 Weakness: Secondary | ICD-10-CM

## 2022-03-14 DIAGNOSIS — N179 Acute kidney failure, unspecified: Secondary | ICD-10-CM | POA: Diagnosis not present

## 2022-03-14 DIAGNOSIS — R296 Repeated falls: Secondary | ICD-10-CM | POA: Diagnosis not present

## 2022-03-14 DIAGNOSIS — F32A Depression, unspecified: Secondary | ICD-10-CM

## 2022-03-14 DIAGNOSIS — R45851 Suicidal ideations: Secondary | ICD-10-CM | POA: Diagnosis not present

## 2022-03-14 DIAGNOSIS — G9340 Encephalopathy, unspecified: Secondary | ICD-10-CM | POA: Diagnosis not present

## 2022-03-14 LAB — COMPREHENSIVE METABOLIC PANEL
ALT: 15 U/L (ref 0–44)
AST: 18 U/L (ref 15–41)
Albumin: 2.8 g/dL — ABNORMAL LOW (ref 3.5–5.0)
Alkaline Phosphatase: 88 U/L (ref 38–126)
Anion gap: 5 (ref 5–15)
BUN: 26 mg/dL — ABNORMAL HIGH (ref 8–23)
CO2: 24 mmol/L (ref 22–32)
Calcium: 8.9 mg/dL (ref 8.9–10.3)
Chloride: 111 mmol/L (ref 98–111)
Creatinine, Ser: 1.49 mg/dL — ABNORMAL HIGH (ref 0.44–1.00)
GFR, Estimated: 34 mL/min — ABNORMAL LOW (ref >60)
Glucose, Bld: 135 mg/dL — ABNORMAL HIGH (ref 70–99)
Potassium: 4.4 mmol/L (ref 3.5–5.1)
Sodium: 140 mmol/L (ref 135–145)
Total Bilirubin: 0.4 mg/dL (ref 0.3–1.2)
Total Protein: 5.1 g/dL — ABNORMAL LOW (ref 6.5–8.1)

## 2022-03-14 LAB — CBC WITH DIFFERENTIAL/PLATELET
Abs Immature Granulocytes: 0.04 10*3/uL (ref 0.00–0.07)
Basophils Absolute: 0 10*3/uL (ref 0.0–0.1)
Basophils Relative: 1 %
Eosinophils Absolute: 0.1 10*3/uL (ref 0.0–0.5)
Eosinophils Relative: 3 %
HCT: 29.5 % — ABNORMAL LOW (ref 36.0–46.0)
Hemoglobin: 9.3 g/dL — ABNORMAL LOW (ref 12.0–15.0)
Immature Granulocytes: 1 %
Lymphocytes Relative: 20 %
Lymphs Abs: 0.8 10*3/uL (ref 0.7–4.0)
MCH: 29.3 pg (ref 26.0–34.0)
MCHC: 31.5 g/dL (ref 30.0–36.0)
MCV: 93.1 fL (ref 80.0–100.0)
Monocytes Absolute: 0.6 10*3/uL (ref 0.1–1.0)
Monocytes Relative: 15 %
Neutro Abs: 2.3 10*3/uL (ref 1.7–7.7)
Neutrophils Relative %: 60 %
Platelets: 218 10*3/uL (ref 150–400)
RBC: 3.17 MIL/uL — ABNORMAL LOW (ref 3.87–5.11)
RDW: 15.3 % (ref 11.5–15.5)
WBC: 3.8 10*3/uL — ABNORMAL LOW (ref 4.0–10.5)
nRBC: 0 % (ref 0.0–0.2)

## 2022-03-14 LAB — PROTIME-INR
INR: 1.1 (ref 0.8–1.2)
Prothrombin Time: 14.2 seconds (ref 11.4–15.2)

## 2022-03-14 LAB — CK: Total CK: 66 U/L (ref 38–234)

## 2022-03-14 LAB — MAGNESIUM: Magnesium: 2 mg/dL (ref 1.7–2.4)

## 2022-03-14 MED ORDER — HYDROXYZINE HCL 10 MG PO TABS
10.0000 mg | ORAL_TABLET | Freq: Three times a day (TID) | ORAL | Status: DC | PRN
  Administered 2022-03-14 – 2022-03-21 (×5): 10 mg via ORAL
  Filled 2022-03-14 (×9): qty 1

## 2022-03-14 MED ORDER — ESCITALOPRAM OXALATE 10 MG PO TABS
10.0000 mg | ORAL_TABLET | Freq: Every day | ORAL | Status: DC
  Administered 2022-03-14: 10 mg via ORAL
  Filled 2022-03-14 (×2): qty 1

## 2022-03-14 MED ORDER — MIRTAZAPINE 15 MG PO TABS
30.0000 mg | ORAL_TABLET | Freq: Every day | ORAL | Status: DC
  Administered 2022-03-14: 30 mg via ORAL
  Filled 2022-03-14: qty 2

## 2022-03-14 NOTE — Evaluation (Signed)
Occupational Therapy Evaluation Patient Details Name: Nancy Suarez MRN: [id number] DOB: 11-05-1931 Today's Date: 03/14/2022   History of Present Illness Pt is a 88 y/o female presenting on 9/10 after fall.  Noted admission 8/29-8/30 with subarachnoid hemorrhage after fall. CT 9/10 with small subdural along posterior falx, follow up CT 9/11 with unchanged subdural and subacute L lateral frontal cortical infarct. PMH includes: HTN, scoliosis, R intertrochanteric IM nail 2023, ORIF femur 2018, L TKA 2015, dementia.   Clinical Impression   Patient admitted for above and presents with problem list below, including impaired cognition, decreased activity tolerance, impaired balance and generalized weakness. Pt with hx of dementia at baseline, but demonstrates difficulty with sequencing simple self care tasks, problem solving (ie how to turn the water on at the sink), and awareness.  She requires up to min assist for grooming but max assist for sequencing tasks, min to mod assist for bathing/dressing and total assist + 2 for toileting.  She completes mobility in room with min guard to min assist using RW.  Vision not formally tested, but requires cueing to locate items on L side of sink today-will continue to assess.  Based on performance today, believe pt will best benefit from continued OT services acutely and after dc at [facility] level as long as she has 24/7 support at discharge.  Will follow acutely.      Recommendations for follow up therapy are one component of a multi-disciplinary discharge planning process, led by the attending physician.  Recommendations may be updated based on patient status, additional functional criteria and insurance authorization.   Follow Up Recommendations  Home health OT    Assistance Recommended at Discharge Frequent or constant Supervision/Assistance  Patient can return home with the following A little help with walking and/or transfers;A lot of help with  bathing/dressing/bathroom;Assistance with cooking/housework;Direct supervision/assist for medications management;Direct supervision/assist for financial management;Assist for transportation;Help with stairs or ramp for entrance    Functional Status Assessment  Patient has had a recent decline in their functional status and demonstrates the ability to make significant improvements in function in a reasonable and predictable amount of time.  Equipment Recommendations  BSC/3in1    Recommendations for Other Services       Precautions / Restrictions Precautions Precautions: Fall Restrictions Weight Bearing Restrictions: No      Mobility Bed Mobility               General bed mobility comments: EOB with PT upon entry    Transfers Overall transfer level: Needs assistance Equipment used: Rolling walker (2 wheels) Transfers: Sit to/from Stand, Bed to chair/wheelchair/BSC Sit to Stand: Min assist           General transfer comment: cueing for hand placement and safety, min assist to power up and steady      Balance Overall balance assessment: Needs assistance Sitting-balance support: No upper extremity supported, Feet supported Sitting balance-Leahy Scale: Good Sitting balance - Comments: dynamically min guard when donning socks   Standing balance support: Bilateral upper extremity supported, During functional activity Standing balance-Leahy Scale: Poor Standing balance comment: relies on BUE and external support                           ADL either performed or assessed with clinical judgement   ADL Overall ADL's : Needs assistance/impaired     Grooming: Minimal assistance;Standing Grooming Details (indicate cue type and reason): for balance, cueing to sequence and manage  sink water/locate soap and towels         Upper Body Dressing : Minimal assistance;Sitting   Lower Body Dressing: Minimal assistance;Sit to/from stand Lower Body Dressing Details  (indicate cue type and reason): able to don socks with increased time after cueing, initally donning socks on her hands; min assist to stand Toilet Transfer: Minimal assistance;Ambulation;Rolling walker (2 wheels) Toilet Transfer Details (indicate cue type and reason): for safety Toileting- Clothing Manipulation and Hygiene: Total assistance;+2 for physical assistance;+2 for safety/equipment;Sit to/from stand Toileting - Clothing Manipulation Details (indicate cue type and reason): due to bowel incontience     Functional mobility during ADLs: Minimal assistance;Rolling walker (2 wheels);Cueing for safety;Cueing for sequencing       Vision   Additional Comments: not formally assessed, requires cueing to locate items at sink on L side.     Perception     Praxis      Pertinent Vitals/Pain Pain Assessment Pain Assessment: Faces Faces Pain Scale: No hurt Pain Intervention(s): Monitored during session     Hand Dominance Right   Extremity/Trunk Assessment Upper Extremity Assessment Upper Extremity Assessment: Generalized weakness   Lower Extremity Assessment Lower Extremity Assessment: Defer to PT evaluation   Cervical / Trunk Assessment Cervical / Trunk Assessment: Kyphotic (scoliosis)   Communication Communication Communication: No difficulties   Cognition Arousal/Alertness: Awake/alert Behavior During Therapy: Anxious Overall Cognitive Status: History of cognitive impairments - at baseline                                 General Comments: pt oriented to self, follows simple commands but demonstrates difficulty with sequencing basic self care tasks, with problem solving and awareness. Incontient of bowel during session.     General Comments  VSS on RA, sitter at side    Exercises     Shoulder Instructions      Home Living Family/patient expects to be discharged to:: Private residence Living Arrangements: Children Available Help at Discharge:  Family;Available 24 hours/day Type of Home: House       Home Layout: Two level     Bathroom Shower/Tub: Tub/shower unit;Walk-in shower         Home Equipment: Agricultural consultant (2 wheels);Rollator (4 wheels);Wheelchair - IT trainer;Tub bench   Additional Comments: stair lift.      Prior Functioning/Environment Prior Level of Function : Needs assist             Mobility Comments: reports mod I amb with RW, forgets to use at times ADLs Comments: reports able to bathe and dress herself, daughter assists as needed with ADLs        OT Problem List: Decreased strength;Decreased activity tolerance;Impaired balance (sitting and/or standing);Pain;Decreased safety awareness;Decreased knowledge of use of DME or AE;Decreased knowledge of precautions;Decreased cognition      OT Treatment/Interventions: Self-care/ADL training;Therapeutic exercise;DME and/or AE instruction;Therapeutic activities;Patient/family education;Balance training;Cognitive remediation/compensation    OT Goals(Current goals can be found in the care plan section) Acute Rehab OT Goals Patient Stated Goal: home OT Goal Formulation: With patient Time For Goal Achievement: 03/28/22 Potential to Achieve Goals: Good  OT Frequency: Min 2X/week    Co-evaluation PT/OT/SLP Co-Evaluation/Treatment: Yes Reason for Co-Treatment: For patient/therapist safety;To address functional/ADL transfers   OT goals addressed during session: ADL's and self-care      AM-PAC OT "6 Clicks" Daily Activity     Outcome Measure Help from another person eating meals?: None Help from another person  taking care of personal grooming?: A Little Help from another person toileting, which includes using toliet, bedpan, or urinal?: Total Help from another person bathing (including washing, rinsing, drying)?: A Lot Help from another person to put on and taking off regular upper body clothing?: A Little Help from another person to put  on and taking off regular lower body clothing?: A Little 6 Click Score: 16   End of Session Equipment Utilized During Treatment: Gait belt;Rolling walker (2 wheels) Nurse Communication: Mobility status  Activity Tolerance: Patient tolerated treatment well Patient left: in chair;with chair alarm set;with nursing/sitter in room  OT Visit Diagnosis: Unsteadiness on feet (R26.81);Muscle weakness (generalized) (M62.81);Other symptoms and signs involving cognitive function                Time: 1227-1252 OT Time Calculation (min): 25 min Charges:  OT General Charges $OT Visit: 1 Visit OT Evaluation $OT Eval Moderate Complexity: 1 Mod  Barry Brunner, OT Acute Rehabilitation Services Office [phone]   Chancy Milroy 03/14/2022, 1:36 PM

## 2022-03-14 NOTE — Hospital Course (Signed)
Nancy Suarez is a 88 y.o. female with medical history significant for recent admission for subarachnoid hemorrhage, chronic iron deficiency anemia associated with baseline hemoglobin 7.5-10, hyperlipidemia, depression, GERD, CKD 3a with baseline creatinine 1.3-1.5, was admitted to [facility] on 03/12/2022 as a transfer from med center [facility] with complaints of falls.  Of note , patient was recently hospitalized at [facility] from 02/28/2022 to 03/01/2022, with subarachnoid hemorrhage after ground-level mechanical fall without loss of consciousness, for which the patient's case and imaging were discussed with neurosurgery who felt no indication for neurosurgical intervention.  The patient was subsequently discharged home, where she lives with her daughter.  Patient again had a ground-level fall on 03/12/22 with concerns for hitting her head and complains of generalized weakness.  She was little more confused than baseline to was brought to the hospital..  In the ED creatinine was 2.1 compared to previous creatinine of 1.4 on 03/01/2022.  Ammonia less than 10.  TSH 3.261.  WBC was 4500, hemoglobin 10. Urinalysis showed no evidence of white blood cells.  Noncontrast CT head on 03/12/2022 showed small amount of subdural blood along the posterior falx without mass effect or midline shift and no evidence of intraparenchymal hemorrhage.  ED provider again spoke with on-call neurosurgery (Dr. Ane Payment) who recommended repeating CT scan in 6 hours and then repeat a CT scan without much interval change.  Patient was IVCD admitted since she was trying to throw herself down on the floor to hurt herself.  She received sedation including Haldol Ativan and was admitted hospital for evaluation of recurrent falls.    Assessment and plan.   Recurrent falls:  At least 2 ground-level falls in the last 2 weeks.  CT head showing new small subdural hematoma.  Neurosurgery recommended repeating CT scan which was performed  without any change.  We will continue fall precautions physical therapy occasional therapy evaluation.   Continue hydration for acute kidney injury.     Subacute metabolic encephalopathy:  Persistent confusion since last hospitalization.  Used to have some confusion at night previously.  Possibly multifactorial secondary to volume depletion AKI centrally acting medications.  We will optimize her situation as best as possible.  Check chest x-ray.  Continue to hold Lexapro and Allegra for now.    Acute kidney injury superimposed on CKD 3a:  Creatinine of 2.1 on presentation from recent baseline 1.3-1.5.  Received IV fluids with improvement to 1.4 today.  We will continue to monitor BMP.    Subdural hematoma: Status post mechanical fall.  Not on blood thinners.  Stable on repeat CT scan.  Neurosurgery from ED was consulted for recommended no neurosurgical intervention and supportive care.  Continue fall precautions.  Avoid DVT prophylaxis at this time.  We will get PT evaluation.    Suicidal ideation:  Express stent during hospitalization in the ED.  Currently IVCD.  Continue suicide precautions.  Follow psychiatry consultation.    Hyperlipidemia:  Statin on hold at this time pending CK levels.  Chronic iron deficiency anemia:  INR of 1.1.  Not on blood thinners as outpatient.  Will monitor CBC.  Latest hemoglobin of 9.3    GERD: Continue H2 blocker.  Allergic rhinitis: Allegra on hold at this time.

## 2022-03-14 NOTE — Evaluation (Signed)
Physical Therapy Evaluation  Patient Details Name: Nancy Suarez MRN: [id number] DOB: [birth year] Today's Date: 03/14/2022  History of Present Illness  Pt is a 88 y/o female presenting on 9/10 after fall.  Noted admission 8/29-8/30 with subarachnoid hemorrhage after fall. CT 9/10 with small subdural along posterior falx, follow up CT 9/11 with unchanged subdural and subacute L lateral frontal cortical infarct. PMH includes: HTN, scoliosis, R intertrochanteric IM nail 2023, ORIF femur 2018, L TKA 2015, dementia.   Clinical Impression  Pt presents to acute PT with balance and mobility deficits, decreased cognition, and high risk for falls. Session limited by incontinence of loose stool in room. Pt emotional at times, asking that pt not be "sent to a home" indicating a nursing home. Feel pt could manage well at home with constant assist/supervision during mobility tasks and use of RW for support. It is my understanding that pt has a walker at home, however pt unable to answer for sure, stating hers may be broken. Will continue to follow and progress as able per POC.        Recommendations for follow up therapy are one component of a multi-disciplinary discharge planning process, led by the attending physician.  Recommendations may be updated based on patient status, additional functional criteria and insurance authorization.  Follow Up Recommendations Home health PT      Assistance Recommended at Discharge Frequent or constant Supervision/Assistance  Patient can return home with the following  A little help with walking and/or transfers;A little help with bathing/dressing/bathroom;Assistance with cooking/housework;Assist for transportation;Help with stairs or ramp for entrance    Equipment Recommendations None recommended by PT  Recommendations for Other Services       Functional Status Assessment Patient has had a recent decline in their functional status and demonstrates the ability to make  significant improvements in function in a reasonable and predictable amount of time.     Precautions / Restrictions Precautions Precautions: Fall Restrictions Weight Bearing Restrictions: No      Mobility  Bed Mobility Overal bed mobility: Needs Assistance Bed Mobility: Supine to Sit     Supine to sit: Supervision, Min guard     General bed mobility comments: Increased time but pt was able to complete without assist. Close guard at times but grossly without hands on.    Transfers Overall transfer level: Needs assistance Equipment used: Rolling walker (2 wheels) Transfers: Sit to/from Stand, Bed to chair/wheelchair/BSC Sit to Stand: Min assist   Step pivot transfers: Min guard       General transfer comment: cueing for hand placement and safety, min assist to power up and steady    Ambulation/Gait Ambulation/Gait assistance: Min guard Gait Distance (Feet): 25 Feet Assistive device: Rolling walker (2 wheels) Gait Pattern/deviations: Step-through pattern, Decreased stride length, Drifts right/left, Trunk flexed, Narrow base of support Gait velocity: Decreased Gait velocity interpretation: <1.31 ft/sec, indicative of household ambulator   General Gait Details: Goal was to get to the bathroom, however pt had incontinence of loose stool while in the doorway of the bathroom. BSC pulled up behind pt to finish going and for clean up. Pt ambulated to the recliner after. Grossly min guard assist with cues for managing RW.  Stairs            Wheelchair Mobility    Modified Rankin (Stroke Patients Only) Modified Rankin (Stroke Patients Only) Pre-Morbid Rankin Score: Moderately severe disability Modified Rankin: Moderately severe disability     Balance Overall balance assessment: Needs assistance Sitting-balance  support: No upper extremity supported, Feet supported Sitting balance-Leahy Scale: Good Sitting balance - Comments: dynamically min guard when donning  socks   Standing balance support: Bilateral upper extremity supported, During functional activity Standing balance-Leahy Scale: Poor Standing balance comment: relies on BUE and external support                             Pertinent Vitals/Pain Pain Assessment Pain Assessment: Faces Faces Pain Scale: No hurt Pain Intervention(s): Monitored during session    Home Living Family/patient expects to be discharged to:: Private residence Living Arrangements: Children Available Help at Discharge: Family;Available 24 hours/day Type of Home: House       Alternate Level Stairs-Number of Steps: stair lift Home Layout: Two level Home Equipment: Rolling Walker (2 wheels);Rollator (4 wheels);Wheelchair - IT trainer;Tub bench Additional Comments: stair lift.    Prior Function Prior Level of Function : Needs assist       Physical Assist : Mobility (physical);ADLs (physical) Mobility (physical): Transfers;Gait ADLs (physical): Bathing;Dressing;Toileting Mobility Comments: reports mod I amb with RW, forgets to use at times ADLs Comments: reports able to bathe and dress herself, daughter assists as needed with ADLs     Hand Dominance   Dominant Hand: Right    Extremity/Trunk Assessment   Upper Extremity Assessment Upper Extremity Assessment: Generalized weakness    Lower Extremity Assessment Lower Extremity Assessment: Generalized weakness    Cervical / Trunk Assessment Cervical / Trunk Assessment: Kyphotic;Other exceptions Cervical / Trunk Exceptions: scoliosis  Communication   Communication: No difficulties  Cognition Arousal/Alertness: Awake/alert Behavior During Therapy: Anxious Overall Cognitive Status: History of cognitive impairments - at baseline                                 General Comments: pt oriented to self, follows simple commands but demonstrates difficulty with sequencing basic self care tasks, with problem solving and  awareness. Incontient of bowel during session.        General Comments General comments (skin integrity, edema, etc.): VSS on RA, sitter at side    Exercises     Assessment/Plan    PT Assessment Patient needs continued PT services  PT Problem List Decreased strength;Decreased range of motion;Decreased activity tolerance;Decreased mobility;Decreased balance;Decreased cognition;Decreased coordination;Decreased knowledge of use of DME;Decreased safety awareness;Decreased knowledge of precautions       PT Treatment Interventions DME instruction;Gait training;Functional mobility training;Therapeutic activities;Therapeutic exercise;Balance training;Neuromuscular re-education;Cognitive remediation;Patient/family education    PT Goals (Current goals can be found in the Care Plan section)  Acute Rehab PT Goals Patient Stated Goal: To go back home, and not to a SNF PT Goal Formulation: With patient Time For Goal Achievement: 03/21/22 Potential to Achieve Goals: Good    Frequency Min 2X/week     Co-evaluation PT/OT/SLP Co-Evaluation/Treatment: Yes Reason for Co-Treatment: Necessary to address cognition/behavior during functional activity;For patient/therapist safety;To address functional/ADL transfers PT goals addressed during session: Mobility/safety with mobility;Balance;Proper use of DME OT goals addressed during session: ADL's and self-care       AM-PAC PT "6 Clicks" Mobility  Outcome Measure Help needed turning from your back to your side while in a flat bed without using bedrails?: A Little Help needed moving from lying on your back to sitting on the side of a flat bed without using bedrails?: A Little Help needed moving to and from a bed to a chair (including a wheelchair)?:  A Little Help needed standing up from a chair using your arms (e.g., wheelchair or bedside chair)?: A Little Help needed to walk in hospital room?: A Little Help needed climbing 3-5 steps with a railing?  : A Little 6 Click Score: 18    End of Session Equipment Utilized During Treatment: Gait belt Activity Tolerance: Patient tolerated treatment well Patient left: in chair;with call bell/phone within reach;with chair alarm set;with nursing/sitter in room Nurse Communication: Mobility status PT Visit Diagnosis: Unsteadiness on feet (R26.81);Other symptoms and signs involving the nervous system (R29.898)    Time: 7169-6789 PT Time Calculation (min) (ACUTE ONLY): 35 min   Charges:   PT Evaluation $PT Eval Moderate Complexity: 1 Mod          Conni Slipper, PT, DPT Acute Rehabilitation Services Secure Chat Preferred Office: [phone]   Marylynn Pearson 03/14/2022, 3:23 PM

## 2022-03-14 NOTE — Consult Note (Cosign Needed)
[facility] New Face-to-Face Psychiatric Evaluation   Name: Nancy Suarez DOB: Nov 20, 1931 MRN: [id number] Service Date: March 14, 2022 LOS:  LOS: 1 day  Reason for Consult: "Depression and SI with plan" Referring Provider: Babs Bertin, DO   Assessment  Nancy Suarez is a 88 y.o. female admitted medically for 03/12/2022  2:08 PM for frequent falls from ground-level with recent admission 8/29 with subarachnoid hemorrhage after ground-level fall without loss of consciousness. She carries the psychiatric diagnosis of depression and has a past medical history of chronic IDA, HLD, and CKD stage IIIa.   Her current presentation of depressed mood after multiple medical insults is most consistent with adjustment disorder with depressed mood. Patient insists that she did not have true SI and only said that she plans to throw herself down the stairs as an anger response to her daughter in being told information that she did not like. Although, patient's recollection of recent events does not seem to be fully intact. She does not meet criteria for inpatient psychiatry based on no significant concerns for harm to self or others in any context outside of lack of physical safety awareness.  Current outpatient psychotropic medications include Lexapro 10 mg and Mirtazapine 15 mg, both of which were discontinued in light of recent falls; but historically she has had a positive mood response to these medications. She was compliant with medications prior to admission as evidenced by patient reporting. Please see plan below for detailed recommendations.   Diagnoses:  Active Hospital problems: Principal Problem:   Frequent falls Active Problems:   GERD (gastroesophageal reflux disease)   HLD (hyperlipidemia)   Chronic iron deficiency anemia   Acute renal failure superimposed on stage 3a chronic kidney disease (HCC)   Acute encephalopathy   Allergic rhinitis   Subdural hematoma (HCC)    Suicidal ideation   Generalized weakness     Plan  ## Safety and Observation Level:  - Based on my clinical evaluation, I estimate the patient to be at moderate risk of self harm in the current setting of lack of safety awareness - At this time, we recommend a continued 1:1 level of observation for safety. This decision is based on my review of the chart including patient's history and current presentation, interview of the patient, mental status examination, and consideration of suicide risk including evaluating suicidal ideation, plan, intent, suicidal or self-harm behaviors, risk factors, and protective factors. This judgment is based on our ability to directly address suicide risk, implement suicide prevention strategies and develop a safety plan while the patient is in the clinical setting. Please contact our team if there is a concern that risk level has changed.   ## Medications:  -- No medication addendums indicated at this time -- Will obtain collateral from daughter   ## Medical Decision Making Capacity:  Formal decision making capacity was not assessed for any particular decision as part of routine psychiatric evaluation.   ## Further Work-up:  -- Repeat EKG   -- most recent EKG on 02/02/22 had QtC of 413 -- Pertinent labwork reviewed earlier this admission includes:  TSH 3.261 BMP: BUN/Cr 33/2.10 (downtrending to 26/1.49 today) Hgb/HCT 10.0/31.7 UA: pos ketones only (UA 8/30 pos mod Hgb, large leukocytes, >50 WBC, and many bacteria)   ## Disposition:  -- Patient likely does not meet criteria for inpatient gero-psych at this time. Will confirm after obtaining collateral  ## Behavioral / Environmental:  -- 1:1 for safety precautions; delirium precautions DELIRIUM RECS  1: Avoid benzodiazepines, antihistamines, anticholinergics, and minimize opiate use as these may worsen delirium. 2:Assess, prevent and manage pain as lack of treatment can result in delirium.  3: Recommend  consult to PT/OT if not already done. Early mobility and exercise has been shown to decrease duration of delirium.  4:Provide appropriate lighting and clear signage; a clock and calendar should be easily visible to the patient. 5:Monitor environmental factors. Reduce light and noise at night (close shades, turn off lights, turn off TV, ect). Correct any alterations in sleep cycle. 6: Reorient the patient to person, place, time and situation on each encounter.  7: Correct sensory deficits if possible (replace eye glasses, hearing aids, ect). 8: Avoid restraints. Severely delirious patients benefit from constant observation by a sitter. 9: Do not leave patient unattended.     ##Legal Status IVC  Thank you for this consult request. Recommendations have been communicated to the primary team.  We will continue to follow at this time, with final determination to be made after obtaining collateral.   Rosezetta Schlatter, MD   NEW history  Relevant Aspects of Hospital Course:  Admitted on 03/12/2022 for recurrent falls.  Patient Report:  Patient reports that she understands psychiatry was consulted because of threats that she made while upset with her daughter, on which she had no intention of following through. She said that she would never hurt herself nor anyone else. Patient does endorse a more depressed mood after her [place] and frequent falls esp because she has had a loss of independence, but she denies active or passive SI, HI, and AVH.   ROS:  Patient endorses low mood after her multiple medical issues but reports normal sleep, appetite, and denies anhedonia and decreased energy.   Collateral information:  Will reach out to daughter, [name]   Psychiatric History:  Information collected from patient, chart  Family psych history: reports aunt with unspecified mental illness requiring hospitalization   Social History:  Lives with daughter since her husband passed 6 months ago  Tobacco use:  denies use in over 40 years Alcohol use: reports occasional taste of wine but denies regular use. Drug use: denies  Family History:   The patient's family history includes Cancer in her brother, father, and mother; Hypertension in her father.  Medical History: Past Medical History:  Diagnosis Date   Allergic rhinitis    ASCUS (atypical squamous cells of undetermined significance) on Pap smear    Atrophic vaginitis    Chronic insomnia    Depression    GERD (gastroesophageal reflux disease)    Goiter    History of kidney stones    Hypercholesteremia    Hypertension    LGSIL (low grade squamous intraepithelial dysplasia)    Osteopenia    Ovarian cyst    "shrank it"   Pancreatitis    Scoliosis    Spinal arthritis     Surgical History: Past Surgical History:  Procedure Laterality Date   ABDOMINAL HYSTERECTOMY  1977   partial   INTRAMEDULLARY (IM) NAIL INTERTROCHANTERIC Right 11/28/2021   Procedure: INTRAMEDULLARY (IM) NAIL INTERTROCHANTRIC;  Surgeon: Rod Can, MD;  Location: [facility];  Service: Orthopedics;  Laterality: Right;   KIDNEY STONE SURGERY  1990's   2-3 stones x1 surgery   ORIF FEMUR FRACTURE Left 06/09/2017   Procedure: OPEN REDUCTION INTERNAL FIXATION (ORIF) DISTAL FEMUR FRACTURE;  Surgeon: Paralee Cancel, MD;  Location: [facility];  Service: Orthopedics;  Laterality: Left;   TONSILLECTOMY  as teenager   TOTAL KNEE  ARTHROPLASTY Left 01/19/2014   Procedure: LEFT TOTAL KNEE ARTHROPLASTY;  Surgeon: Shelda Pal, MD;  Location: [facility];  Service: Orthopedics;  Laterality: Left;   WISDOM TOOTH EXTRACTION      Medications:   Current Facility-Administered Medications:    acetaminophen (TYLENOL) tablet 650 mg, 650 mg, Oral, Q6H PRN **OR** acetaminophen (TYLENOL) suppository 650 mg, 650 mg, Rectal, Q6H PRN, Howerter, Justin B, DO   acetaminophen (TYLENOL) tablet 1,000 mg, 1,000 mg, Oral, BID, Pricilla Loveless, MD, 1,000 mg at 03/14/22 2138   docusate sodium (COLACE)  capsule 100 mg, 100 mg, Oral, Daily PRN, Pricilla Loveless, MD   escitalopram (LEXAPRO) tablet 10 mg, 10 mg, Oral, Daily, Pokhrel, Laxman, MD, 10 mg at 03/14/22 1453   famotidine (PEPCID) tablet 20 mg, 20 mg, Oral, BID, Pricilla Loveless, MD, 20 mg at 03/14/22 2137   hydrOXYzine (ATARAX) tablet 10 mg, 10 mg, Oral, TID PRN, Pokhrel, Laxman, MD, 10 mg at 03/14/22 1933   melatonin tablet 3 mg, 3 mg, Oral, QHS PRN, Howerter, Justin B, DO   mirtazapine (REMERON) tablet 30 mg, 30 mg, Oral, QHS, Pokhrel, Laxman, MD, 30 mg at 03/14/22 2136   multivitamin with minerals tablet 1 tablet, 1 tablet, Oral, Daily, Pricilla Loveless, MD, 1 tablet at 03/14/22 0837   polyvinyl alcohol (LIQUIFILM TEARS) 1.4 % ophthalmic solution 1 drop, 1 drop, Both Eyes, TID, Pricilla Loveless, MD, 1 drop at 03/14/22 2139   sodium chloride flush (NS) 0.9 % injection 3 mL, 3 mL, Intravenous, Q12H, Pricilla Loveless, MD, 3 mL at 03/14/22 0838   sodium chloride flush (NS) 0.9 % injection 3 mL, 3 mL, Intravenous, PRN, Pricilla Loveless, MD  Allergies: Allergies  Allergen Reactions   Ambien [Zolpidem] Other (See Comments)    Disorientation Aggression    Aspirin Other (See Comments)    Upsets stomach if 325mg    Codeine Nausea And Vomiting   Namenda [Memantine] Other (See Comments)    Agitation Aggression    Percocet [Oxycodone-Acetaminophen] Other (See Comments)    Disorientation Aggression     Sulfa Antibiotics Nausea Only       Objective  Vital signs:  Temp:  [97.7 F (36.5 C)-98.4 F (36.9 C)] 97.7 F (36.5 C) (09/12 2200) Pulse Rate:  [68-86] 85 (09/12 2200) Resp:  [16-19] 18 (09/12 2200) BP: (129-169)/(58-78) 169/75 (09/12 2200) SpO2:  [95 %-100 %] 96 % (09/12 2200) Weight:  [61.9 kg] 61.9 kg (09/12 0500)  Psychiatric Specialty Exam:  Presentation  General Appearance: Appropriate for Environment; Casual Eye Contact:Good Speech:Clear and Coherent; Normal Rate Speech Volume:Normal Handedness:No data recorded  Mood  and Affect  Mood:Depressed ("More down than normal") Affect:Congruent; Depressed  Thought Process  Thought Processes:Coherent; Irrevelant (Mostly coherent, sometimes irrelevant, but concern in the setting of decreased hearing.) Descriptions of Associations:Intact (Mostly intact, sometimes loose when she appears to misunderstand questions asked)  Orientation:Full (Time, Place and Person) (person, place, month, 10-08-1978, and situation. States "all i do is wake, take my medication, and go to bed; why do i need to know the date?")  Thought Content:Abstract Reasoning; Logical  History of Schizophrenia/Schizoaffective disorder:No data recorded Duration of Psychotic Symptoms:No data recorded Hallucinations:Hallucinations: None  Ideas of Reference:None  Suicidal Thoughts:Suicidal Thoughts: No  Homicidal Thoughts:Homicidal Thoughts: No   Sensorium  Memory:Immediate Fair; Remote Good Judgment:Fair Insight:Good; Shallow (She understands that she has deficits and is sometimes able to correctly identify them, but unable to do so as pertaining to her mood.)  Executive Functions  Concentration:Fair Attention Span:Poor Recall:Fair Economist of Knowledge:Fair  Language:Good  Psychomotor Activity  Psychomotor Activity:Psychomotor Activity: Normal  Assets  Assets:Communication Skills; Desire for Improvement; Housing; Resilience; Social Support  Sleep  Sleep:Sleep: Good   Physical Exam: Physical Exam Vitals reviewed.  Constitutional:      General: She is not in acute distress.    Appearance: She is not toxic-appearing.  HENT:     Head: Normocephalic and atraumatic.  Pulmonary:     Effort: Pulmonary effort is normal.  Skin:    General: Skin is warm and dry.  Neurological:     Mental Status: She is alert and oriented to person, place, and time.     Blood pressure (!) 169/75, pulse 85, temperature 97.7 F (36.5 C), temperature source Oral, resp. rate 18, height 5\' 5"  (1.651 m),  weight 61.9 kg, SpO2 96 %. Body mass index is 22.71 kg/m.

## 2022-03-14 NOTE — Progress Notes (Signed)
Tele on standby, patient removed monitor, exhibiting agitation at intervals, removed IV early in shift, MD notified and aware, ok to leave IV out at this time, New order received atarax 10mg  po TID prn.

## 2022-03-14 NOTE — Progress Notes (Signed)
PROGRESS NOTE    Nancy Suarez  HYI:[id number] DOB: 02-Jun-1932 DOA: 03/12/2022 PCP: Corine Shelter, MD    Brief Narrative:  Nancy Suarez is a 88 y.o. female with medical history significant for recent admission for subarachnoid hemorrhage, chronic iron deficiency anemia associated with baseline hemoglobin 7.5-10, hyperlipidemia, depression, GERD, CKD 3a with baseline creatinine 1.3-1.5, was admitted to [facility] on 03/12/2022 as a transfer from med center [facility] with complaints of falls.  Of note , patient was recently hospitalized at [facility] from 02/28/2022 to 03/01/2022, with subarachnoid hemorrhage after ground-level mechanical fall without loss of consciousness, for which the patient's case and imaging were discussed with neurosurgery who felt no indication for neurosurgical intervention.  The patient was subsequently discharged home, where she lives with her daughter.  Patient again had a ground-level fall on 03/12/22 with concerns for hitting her head and complains of generalized weakness.  She was little more confused than baseline to was brought to the hospital..  In the ED creatinine was 2.1 compared to previous creatinine of 1.4 on 03/01/2022.  Ammonia less than 10.  TSH 3.261.  WBC was 4500, hemoglobin 10. Urinalysis showed no evidence of white blood cells.  Noncontrast CT head on 03/12/2022 showed small amount of subdural blood along the posterior falx without mass effect or midline shift and no evidence of intraparenchymal hemorrhage.  ED provider again spoke with on-call neurosurgery (Dr. Ane Payment) who recommended repeating CT scan in 6 hours and then repeat a CT scan without much interval change.  Patient was IVCD admitted since she was trying to throw herself down on the floor to hurt herself.  She received sedation including Haldol Ativan and was admitted hospital for evaluation of recurrent falls.    Assessment and plan. Principal Problem:   Frequent falls Active  Problems:   GERD (gastroesophageal reflux disease)   HLD (hyperlipidemia)   Chronic iron deficiency anemia   Acute renal failure superimposed on stage 3a chronic kidney disease (HCC)   Acute encephalopathy   Allergic rhinitis   Subdural hematoma (HCC)   Suicidal ideation   Generalized weakness    Recurrent falls:  At least 2 ground-level falls in the last 2 weeks.  CT head showing new small subdural hematoma.  Neurosurgery recommended repeating CT scan which was performed without any change.  We will continue fall precautions physical therapy occasional therapy evaluation.   Continue hydration for acute kidney injury.     Subacute metabolic encephalopathy:  Persistent confusion since last hospitalization.  Used to have some confusion at night previously.  Possibly multifactorial secondary to volume depletion AKI centrally acting medications.  We will optimize her situation as best as possible.  Check chest x-ray.  Continue to hold Lexapro and Allegra for now.    Acute kidney injury superimposed on CKD 3a:  Creatinine of 2.1 on presentation from recent baseline 1.3-1.5.  Received IV fluids with improvement to 1.4 today.  We will continue to monitor BMP.    Subdural hematoma: Status post mechanical fall.  Not on blood thinners.  Stable on repeat CT scan.  Neurosurgery from ED was consulted for recommended no neurosurgical intervention and supportive care.  Continue fall precautions.  Avoid DVT prophylaxis at this time.  We will get PT evaluation.    Suicidal ideation:  Express stent during hospitalization in the ED.  Currently IVCD.  Continue suicide precautions.  Follow psychiatry consultation.    Hyperlipidemia:  Statin on hold at this time pending CK levels.  Chronic iron deficiency anemia:  INR of 1.1.  Not on blood thinners as outpatient.  Will monitor CBC.  Latest hemoglobin of 9.3    GERD: Continue H2 blocker.  Allergic rhinitis: Allegra on hold at this time.   Generalized  debility, deconditioning.  We will get PT evaluation.      DVT prophylaxis: SCDs Start: 03/13/22 2337   Code Status:     Code Status: Full Code  Disposition: Uncertain at this time might need rehabilitation.  Status is: Inpatient  Remains inpatient appropriate because: Frequent falls, suicidal ideation, possible need for rehab, AKI, IV fluids   Family Communication: Not at bedside  Consultants:  Neurosurgery. Psychiatry  Procedures:  None  Antimicrobials:  None  Anti-infectives (From admission, onward)    None       Subjective: Today, patient was seen and examined at bedside.  Patient denies any nausea vomiting headache dizziness or lightheadedness.  Denies active suicidal ideation at this time.  Patient's sitter at bedside.  Objective: Vitals:   03/13/22 2358 03/14/22 0400 03/14/22 0500 03/14/22 0844  BP: (!) 129/58 132/60  (!) 151/66  Pulse: 68 69  77  Resp: 19 16  19   Temp: 97.9 F (36.6 C) 97.9 F (36.6 C)  98.3 F (36.8 C)  TempSrc: Oral Oral  Oral  SpO2: 95% 98%  100%  Weight:   61.9 kg   Height:        Intake/Output Summary (Last 24 hours) at 03/14/2022 0939 Last data filed at 03/14/2022 0900 Gross per 24 hour  Intake 237 ml  Output 300 ml  Net -63 ml   Filed Weights   03/12/22 1417 03/14/22 0500  Weight: 62 kg 61.9 kg    Physical Examination: Body mass index is 22.71 kg/m.  General:  Average built, not in obvious distress alert awake and Communicative HENT:   No scleral pallor or icterus noted. Oral mucosa is moist.  Left supraorbital bruise Chest:  Clear breath sounds.  Diminished breath sounds bilaterally. No crackles or wheezes.  CVS: S1 &S2 heard. No murmur.  Regular rate and rhythm. Abdomen: Soft, nontender, nondistended.  Bowel sounds are heard.   Extremities: No cyanosis, clubbing or edema.  Peripheral pulses are palpable. Psych: Alert, awake and oriented, normal mood CNS:  No cranial nerve deficits.  Lower extremities,  generalized weakness noted. Skin: Warm and dry.  No rashes noted.  Data Reviewed:   CBC: Recent Labs  Lab 03/12/22 1530 03/13/22 0757 03/14/22 0312  WBC 4.5 3.7* 3.8*  NEUTROABS 2.9 1.9 2.3  HGB 10.0* 9.4* 9.3*  HCT 31.7* 29.6* 29.5*  MCV 91.9 92.5 93.1  PLT 222 196 218    Basic Metabolic Panel: Recent Labs  Lab 03/12/22 1530 03/13/22 0757 03/14/22 0312  NA 139 140 140  K 4.7 4.3 4.4  CL 106 111 111  CO2 24 24 24   GLUCOSE 80 58* 135*  BUN 33* 27* 26*  CREATININE 2.10* 1.66* 1.49*  CALCIUM 9.3 8.7* 8.9  MG  --   --  2.0    Liver Function Tests: Recent Labs  Lab 03/12/22 2158 03/14/22 0312  AST 19 18  ALT 14 15  ALKPHOS 72 88  BILITOT 1.0 0.4  PROT 6.0* 5.1*  ALBUMIN 3.2* 2.8*     Radiology Studies: DG Chest Port 1 View  Result Date: 03/14/2022 CLINICAL DATA:  Generalized weakness. EXAM: PORTABLE CHEST 1 VIEW COMPARISON:  02/02/2022 FINDINGS: Stable marked eventration of the right hemidiaphragm with overlying vascular crowding and  atelectasis. The heart is within normal limits in size. Stable tortuosity and calcification of the thoracic aorta. Stable thyromegaly. Right upper lobe pulmonary nodule. No infiltrates, edema or effusions. The bony thorax is grossly intact. Stable scoliosis and degenerative changes involving the spine. Stable advanced bilateral shoulder joint degenerative changes. IMPRESSION: 1. 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 2. Persistent right upper lobe pulmonary lesion. 3. No acute pulmonary findings. Electronically Signed   By: Marijo Sanes M.D.   On: 03/14/2022 08:34   CT Head Wo Contrast  Result Date: 03/13/2022 CLINICAL DATA:  Follow-up left parafalcine subdural hematoma. EXAM: CT HEAD WITHOUT CONTRAST TECHNIQUE: Contiguous axial images were obtained from the base of the skull through the vertex without intravenous contrast. RADIATION DOSE REDUCTION: This exam was  performed according to the departmental dose-optimization program which includes automated exposure control, adjustment of the mA and/or kV according to patient size and/or use of iterative reconstruction technique. COMPARISON:  Head CT yesterday at 8:18 p.m., MRI brain 03/04/2022, head CT 02/28/2022. FINDINGS: Current study images completed 03/13/2022 at 4:03 a.m. Brain: Again noted is a small 3 mm in thickness hyperdense posterior right parafalcine subdural bleed, unchanged. Additional small subdural bleed extends over the left leaf of the tentorium measuring 2 mm thickness. No new or increased hemorrhage is seen. Unchanged findings of mild atrophy, moderately developed small-vessel disease and mild atrophic ventriculomegaly. There is no midline shift. Left lateral frontal lobe subacute cortical infarct is again noted in the prior location of the cortical contusion noted previously. No hyperdense bleed is seen in this area today. The cerebral and cerebellar hemispheres are otherwise unremarkable. There is no abnormality in the basal cisterns. Vascular: There are calcifications of the carotid siphons but no hyperdense central vessels. Skull: Swelling in the lateral left orbitofrontal area is again noted. Negative for skull fracture or focal lesion. Sinuses/Orbits: Visualized sinuses and mastoid air cells are clear. Other: None. IMPRESSION: 1. Small posterior right parafalcine and left tentorial subdural bleed, unchanged. 2. No parenchymal hemorrhage is seen. 3. Subacute lateral left frontal cortical infarct 4. No mass effect.  No further hemorrhage. Electronically Signed   By: Telford Nab M.D.   On: 03/13/2022 04:45   CT Head Wo Contrast  Result Date: 03/12/2022 CLINICAL DATA:  Mental status change, unknown cause EXAM: CT HEAD WITHOUT CONTRAST TECHNIQUE: Contiguous axial images were obtained from the base of the skull through the vertex without intravenous contrast. RADIATION DOSE REDUCTION: This exam was  performed according to the departmental dose-optimization program which includes automated exposure control, adjustment of the mA and/or kV according to patient size and/or use of iterative reconstruction technique. COMPARISON:  02/28/2022 FINDINGS: Brain: Small amount of subdural blood noted along the posterior falx. No intraparenchymal hemorrhage. No mass effect or midline shift. No hydrocephalus. There is atrophy and chronic small vessel disease changes. Vascular: No hyperdense vessel or unexpected calcification. Skull: No acute calvarial abnormality. Sinuses/Orbits: No acute findings Other: Soft tissue swelling over the left lateral orbit and forehead. IMPRESSION: Small amount of subdural blood along the posterior falx. No mass effect or midline shift. No intraparenchymal hemorrhage. These results were called by telephone at the time of interpretation on 03/12/2022 at 9:27 pm to provider Garnette Gunner , who verbally acknowledged these results. Electronically Signed   By: Rolm Baptise M.D.   On: 03/12/2022 21:32      LOS: 1 day    Flora Lipps, MD [facility] Available via Epic secure chat  7am-7pm After these hours, please refer to coverage provider listed on [email or web address] 03/14/2022, 9:39 AM

## 2022-03-15 ENCOUNTER — Encounter (HOSPITAL_COMMUNITY): Payer: Self-pay

## 2022-03-15 ENCOUNTER — Ambulatory Visit (HOSPITAL_COMMUNITY): Payer: Medicare Other

## 2022-03-15 DIAGNOSIS — R45851 Suicidal ideations: Secondary | ICD-10-CM | POA: Diagnosis not present

## 2022-03-15 DIAGNOSIS — R296 Repeated falls: Secondary | ICD-10-CM | POA: Diagnosis not present

## 2022-03-15 DIAGNOSIS — F32A Depression, unspecified: Secondary | ICD-10-CM | POA: Diagnosis not present

## 2022-03-15 DIAGNOSIS — G9340 Encephalopathy, unspecified: Secondary | ICD-10-CM | POA: Diagnosis not present

## 2022-03-15 DIAGNOSIS — N179 Acute kidney failure, unspecified: Secondary | ICD-10-CM | POA: Diagnosis not present

## 2022-03-15 LAB — CBC
HCT: 32.1 % — ABNORMAL LOW (ref 36.0–46.0)
Hemoglobin: 10.3 g/dL — ABNORMAL LOW (ref 12.0–15.0)
MCH: 29.8 pg (ref 26.0–34.0)
MCHC: 32.1 g/dL (ref 30.0–36.0)
MCV: 92.8 fL (ref 80.0–100.0)
Platelets: 210 10*3/uL (ref 150–400)
RBC: 3.46 MIL/uL — ABNORMAL LOW (ref 3.87–5.11)
RDW: 15.2 % (ref 11.5–15.5)
WBC: 5.2 10*3/uL (ref 4.0–10.5)
nRBC: 0 % (ref 0.0–0.2)

## 2022-03-15 LAB — BASIC METABOLIC PANEL
Anion gap: 7 (ref 5–15)
BUN: 21 mg/dL (ref 8–23)
CO2: 25 mmol/L (ref 22–32)
Calcium: 9.3 mg/dL (ref 8.9–10.3)
Chloride: 109 mmol/L (ref 98–111)
Creatinine, Ser: 1.39 mg/dL — ABNORMAL HIGH (ref 0.44–1.00)
GFR, Estimated: 36 mL/min — ABNORMAL LOW (ref >60)
Glucose, Bld: 105 mg/dL — ABNORMAL HIGH (ref 70–99)
Potassium: 3.9 mmol/L (ref 3.5–5.1)
Sodium: 141 mmol/L (ref 135–145)

## 2022-03-15 LAB — MAGNESIUM: Magnesium: 2.1 mg/dL (ref 1.7–2.4)

## 2022-03-15 MED ORDER — LORAZEPAM 2 MG/ML IJ SOLN
1.0000 mg | INTRAMUSCULAR | Status: AC
  Administered 2022-03-15: 1 mg via INTRAMUSCULAR
  Filled 2022-03-15: qty 1

## 2022-03-15 MED ORDER — HYDRALAZINE HCL 20 MG/ML IJ SOLN
5.0000 mg | INTRAMUSCULAR | Status: AC
  Administered 2022-03-15: 5 mg via INTRAVENOUS
  Filled 2022-03-15: qty 1

## 2022-03-15 MED ORDER — ESCITALOPRAM OXALATE 10 MG PO TABS
10.0000 mg | ORAL_TABLET | Freq: Every day | ORAL | Status: DC
  Administered 2022-03-15 – 2022-03-31 (×17): 10 mg via ORAL
  Filled 2022-03-15 (×17): qty 1

## 2022-03-15 MED ORDER — HYDRALAZINE HCL 20 MG/ML IJ SOLN
5.0000 mg | Freq: Four times a day (QID) | INTRAMUSCULAR | Status: DC | PRN
  Administered 2022-03-15 – 2022-03-18 (×2): 5 mg via INTRAVENOUS
  Filled 2022-03-15 (×2): qty 1

## 2022-03-15 MED ORDER — DIVALPROEX SODIUM 125 MG PO CSDR
125.0000 mg | DELAYED_RELEASE_CAPSULE | Freq: Every day | ORAL | Status: DC
  Administered 2022-03-15: 125 mg via ORAL
  Filled 2022-03-15: qty 1

## 2022-03-15 MED ORDER — ATORVASTATIN CALCIUM 10 MG PO TABS
20.0000 mg | ORAL_TABLET | Freq: Every day | ORAL | Status: DC
  Administered 2022-03-15 – 2022-03-31 (×17): 20 mg via ORAL
  Filled 2022-03-15 (×18): qty 2

## 2022-03-15 MED ORDER — FAMOTIDINE 20 MG PO TABS
20.0000 mg | ORAL_TABLET | Freq: Every day | ORAL | Status: DC
  Administered 2022-03-16: 20 mg via ORAL
  Filled 2022-03-15: qty 1

## 2022-03-15 NOTE — Progress Notes (Addendum)
Physical Therapy Treatment Patient Details Name: Nancy Suarez MRN: [id number] DOB: 04/12/1932 Today's Date: 03/15/2022   History of Present Illness Pt is a 88 y/o female presenting on 9/10 after fall. CT 9/10 with small subdural along posterior falx, follow up CT 9/11 with unchanged subdural and subacute L lateral frontal cortical infarct. Noted admission 8/29-8/30 with subarachnoid hemorrhage after fall. PMH includes: HTN, scoliosis, R intertrochanteric IM nail 2023, ORIF femur 2018, L TKA 2015, dementia.    PT Comments    Pt progressing towards physical therapy goals. She was lethargic at beginning of session, likely due to meds given overnight. Pt was assisted to EOB where she was able to be roused, washed her face off, and participated well with PT. She continues to be confused, and was reoriented to place and situation. After being set up for breakfast, pt was able to feed herself without difficulty. Updated recommendations to SNF level rehab after discussion with care team regarding family's desire for SNF prior to return home. Feel this is appropriate to maximize functional independence and decrease burden of care on family. Will continue to follow and progress as able per POC.     Recommendations for follow up therapy are one component of a multi-disciplinary discharge planning process, led by the attending physician.  Recommendations may be updated based on patient status, additional functional criteria and insurance authorization.  Follow Up Recommendations  Skilled nursing-short term rehab (<3 hours/day) Can patient physically be transported by private vehicle: No   Assistance Recommended at Discharge Frequent or constant Supervision/Assistance  Patient can return home with the following A little help with walking and/or transfers;A little help with bathing/dressing/bathroom;Assistance with cooking/housework;Assist for transportation;Help with stairs or ramp for entrance   Equipment  Recommendations  None recommended by PT    Recommendations for Other Services       Precautions / Restrictions Precautions Precautions: Fall Restrictions Weight Bearing Restrictions: No     Mobility  Bed Mobility Overal bed mobility: Needs Assistance Bed Mobility: Rolling, Sidelying to Sit Rolling: Max assist Sidelying to sit: Mod assist, +2 for physical assistance       General bed mobility comments: Increased assist due to lethargy. Once sitting up EOB pt washed face and was more alert. Held sitting balance well.    Transfers Overall transfer level: Needs assistance Equipment used: Rolling walker (2 wheels) Transfers: Sit to/from Stand, Bed to chair/wheelchair/BSC Sit to Stand: Min assist, +2 safety/equipment   Step pivot transfers: Min assist       General transfer comment: Light assist for power up to full stand and to gain/maintain standing balance with walker. Pt was able to take pivotal steps around to the chair and assist required for walker management in a tighter space. Good hand placement on seated surface for safety prior to stand>sit.    Ambulation/Gait               General Gait Details: Did not progress gait training at this time as pt was very lethargic at beginning of session.   Stairs             Wheelchair Mobility    Modified Rankin (Stroke Patients Only) Modified Rankin (Stroke Patients Only) Pre-Morbid Rankin Score: Moderately severe disability Modified Rankin: Moderately severe disability     Balance Overall balance assessment: Needs assistance Sitting-balance support: No upper extremity supported, Feet supported Sitting balance-Leahy Scale: Fair     Standing balance support: Bilateral upper extremity supported, During functional activity Standing balance-Leahy Scale:  Poor Standing balance comment: relies on BUE and external support                            Cognition Arousal/Alertness: Lethargic (alert  by end of session) Behavior During Therapy: WFL for tasks assessed/performed Overall Cognitive Status: History of cognitive impairments - at baseline                                 General Comments: Pt remembers therapist from yesterday's session. Lethargic initially but able to be roused with activity. Alert at end of session in chair feeding herself.        Exercises      General Comments        Pertinent Vitals/Pain Pain Assessment Pain Assessment: Faces Faces Pain Scale: No hurt    Home Living                          Prior Function            PT Goals (current goals can now be found in the care plan section) Acute Rehab PT Goals Patient Stated Goal: To go back home, and not to a SNF PT Goal Formulation: With patient Time For Goal Achievement: 03/21/22 Potential to Achieve Goals: Good Progress towards PT goals: Progressing toward goals    Frequency    Min 2X/week      PT Plan Current plan remains appropriate    Co-evaluation              AM-PAC PT "6 Clicks" Mobility   Outcome Measure  Help needed turning from your back to your side while in a flat bed without using bedrails?: A Little Help needed moving from lying on your back to sitting on the side of a flat bed without using bedrails?: A Little Help needed moving to and from a bed to a chair (including a wheelchair)?: A Little Help needed standing up from a chair using your arms (e.g., wheelchair or bedside chair)?: A Little Help needed to walk in hospital room?: A Little Help needed climbing 3-5 steps with a railing? : A Little 6 Click Score: 18    End of Session Equipment Utilized During Treatment: Gait belt Activity Tolerance: Patient tolerated treatment well Patient left: in chair;with call bell/phone within reach;with chair alarm set;with nursing/sitter in room Nurse Communication: Mobility status PT Visit Diagnosis: Unsteadiness on feet (R26.81);Other symptoms  and signs involving the nervous system (R29.898)     Time: 1001-1020 PT Time Calculation (min) (ACUTE ONLY): 19 min  Charges:  $Gait Training: 8-22 mins                     Conni Slipper, PT, DPT Acute Rehabilitation Services Secure Chat Preferred Office: [phone]    Marylynn Pearson 03/15/2022, 2:49 PM

## 2022-03-15 NOTE — NC FL2 (Signed)
[place] MEDICAID FL2 LEVEL OF CARE SCREENING TOOL     IDENTIFICATION  Patient Name: Nancy Suarez Birthdate: [birth year] Sex: female Admission Date (Current Location): 03/12/2022  [facility] and [place] Number:  Producer, television/film/video and Address:  [facility], [address], [place], [place] [address]      Provider Number: [id number]  Attending Physician Name and Address:  Joycelyn Das, MD  Relative Name and Phone Number:       Current Level of Care: Hospital Recommended Level of Care: Skilled Nursing Facility Prior Approval Number:    Date Approved/Denied:   PASRR Number: [id number]  Discharge Plan: SNF    Current Diagnoses: Patient Active Problem List   Diagnosis Date Noted   Frequent falls 03/14/2022   Suicidal ideation 03/14/2022   Generalized weakness 03/14/2022   Subdural hematoma (HCC) 03/12/2022   Acute encephalopathy 02/28/2022   Subarachnoid hemorrhage (HCC) 02/28/2022   Allergic rhinitis 02/28/2022   Hip fracture (HCC) 11/27/2021   Normocytic anemia 11/27/2021   Physical deconditioning 04/16/2021   Essential hypertension 06/05/2017   HLD (hyperlipidemia) 06/05/2017   Depression 06/05/2017   Chronic iron deficiency anemia 06/05/2017   Acute renal failure superimposed on stage 3a chronic kidney disease (HCC) 06/05/2017   Closed fracture of left distal femur (HCC) 06/05/2017   Fall at home, initial encounter    Overweight (BMI 25.0-29.9) 01/21/2014   Postoperative anemia due to acute blood loss 01/21/2014   S/P left TKA 01/19/2014   Scoliosis    Osteopenia    Chronic insomnia    Atrophic vaginitis    Spinal arthritis    GERD (gastroesophageal reflux disease)    ASCUS (atypical squamous cells of undetermined significance) on Pap smear    Ovarian cyst    LGSIL (low grade squamous intraepithelial dysplasia)     Orientation RESPIRATION BLADDER Height & Weight     Self  Normal Continent Weight: 132 lb 4.4 oz (60  kg) Height:  5\' 5"  (165.1 cm)  BEHAVIORAL SYMPTOMS/MOOD NEUROLOGICAL BOWEL NUTRITION STATUS      Continent Diet (regular)  AMBULATORY STATUS COMMUNICATION OF NEEDS Skin   Limited Assist Verbally Normal                       Personal Care Assistance Level of Assistance  Bathing, Feeding, Dressing Bathing Assistance: Limited assistance Feeding assistance: Limited assistance Dressing Assistance: Limited assistance     Functional Limitations Info             SPECIAL CARE FACTORS FREQUENCY  PT (By licensed PT), OT (By licensed OT)     PT Frequency: 5x/wk OT Frequency: 5x/wk            Contractures Contractures Info: Not present    Additional Factors Info  Code Status, Allergies, Psychotropic Code Status Info: Full Allergies Info: Ambien (Zolpidem), Aspirin, Codeine, Namenda (Memantine), Percocet (Oxycodone-acetaminophen), Sulfa Antibiotics Psychotropic Info: Depakote Sprinkle 125mg  daily at bed; Lexapro 10mg  daily         Current Medications (03/15/2022):  This is the current hospital active medication list Current Facility-Administered Medications  Medication Dose Route Frequency Provider Last Rate Last Admin   acetaminophen (TYLENOL) tablet 650 mg  650 mg Oral Q6H PRN Howerter, Justin B, DO       Or   acetaminophen (TYLENOL) suppository 650 mg  650 mg Rectal Q6H PRN Howerter, Justin B, DO       acetaminophen (TYLENOL) tablet 1,000 mg  1,000 mg  Oral BID Pricilla Loveless, MD   1,000 mg at 03/15/22 1103   atorvastatin (LIPITOR) tablet 20 mg  20 mg Oral Daily Pokhrel, Laxman, MD       divalproex (DEPAKOTE SPRINKLE) capsule 125 mg  125 mg Oral QHS Lamar Sprinkles, MD       docusate sodium (COLACE) capsule 100 mg  100 mg Oral Daily PRN Pricilla Loveless, MD       escitalopram (LEXAPRO) tablet 10 mg  10 mg Oral Daily Lamar Sprinkles, MD   10 mg at 03/15/22 1103   [START ON 03/16/2022] famotidine (PEPCID) tablet 20 mg  20 mg Oral Daily Pokhrel, Laxman, MD       hydrALAZINE  (APRESOLINE) injection 5 mg  5 mg Intravenous Q6H PRN Dow Adolph N, DO   5 mg at 03/15/22 2426   hydrOXYzine (ATARAX) tablet 10 mg  10 mg Oral TID PRN Pokhrel, Laxman, MD   10 mg at 03/15/22 1104   melatonin tablet 3 mg  3 mg Oral QHS PRN Howerter, Justin B, DO       multivitamin with minerals tablet 1 tablet  1 tablet Oral Daily Pricilla Loveless, MD   1 tablet at 03/15/22 1103   polyvinyl alcohol (LIQUIFILM TEARS) 1.4 % ophthalmic solution 1 drop  1 drop Both Eyes TID Pricilla Loveless, MD   1 drop at 03/15/22 1105   sodium chloride flush (NS) 0.9 % injection 3 mL  3 mL Intravenous Q12H Pricilla Loveless, MD   3 mL at 03/15/22 1113   sodium chloride flush (NS) 0.9 % injection 3 mL  3 mL Intravenous PRN Pricilla Loveless, MD         Discharge Medications: Please see discharge summary for a list of discharge medications.  Relevant Imaging Results:  Relevant Lab Results:   Additional Information SS#: [id number]  Baldemar Lenis, LCSW

## 2022-03-15 NOTE — Progress Notes (Addendum)
PROGRESS NOTE    Nancy Suarez  [id number] DOB: 1932-02-02 DOA: 03/12/2022 PCP: Vincente Liberty, MD    Brief Narrative:  Nancy Suarez is a 88 y.o. female with medical history significant for recent admission for subarachnoid hemorrhage, chronic iron deficiency anemia associated with baseline hemoglobin 7.5-10, hyperlipidemia, depression, GERD, CKD 3a with baseline creatinine 1.3-1.5, was admitted to [facility] on 03/12/2022 as a transfer from med center [facility] with complaints of falls.  Of note , patient was recently hospitalized at [facility] from 02/28/2022 to 03/01/2022, with subarachnoid hemorrhage after ground-level mechanical fall without loss of consciousness, for which the patient's case and imaging were discussed with neurosurgery who felt no indication for neurosurgical intervention.  The patient was subsequently discharged home, where she lives with her daughter.  Patient again had a ground-level fall on 03/12/22 with concerns for hitting her head and complains of generalized weakness.  She was little more confused than baseline to was brought to the hospital..  In the ED creatinine was 2.1 compared to previous creatinine of 1.4 on 03/01/2022.  Ammonia less than 10.  TSH 3.261.  WBC was 4500, hemoglobin 10. Urinalysis showed no evidence of white blood cells.  Noncontrast CT head on 03/12/2022 showed small amount of subdural blood along the posterior falx without mass effect or midline shift and no evidence of intraparenchymal hemorrhage.  ED provider again spoke with on-call neurosurgery (Dr. Dory Larsen) who recommended repeating CT scan in 6 hours and then repeat a CT scan without much interval change.  Patient was IVCD admitted since she was trying to throw herself down on the floor to hurt herself.  She received sedation including Haldol Ativan and was admitted hospital for evaluation of recurrent falls.    Assessment and plan. Principal Problem:   Frequent falls Active  Problems:   GERD (gastroesophageal reflux disease)   HLD (hyperlipidemia)   Chronic iron deficiency anemia   Acute renal failure superimposed on stage 3a chronic kidney disease (HCC)   Acute encephalopathy   Allergic rhinitis   Subdural hematoma (HCC)   Suicidal ideation   Generalized weakness    Recurrent falls:  At least 2 ground-level falls in the last 2 weeks.  CT head showing new small subdural hematoma.  Was on benzodiazepines for prolonged duration at home which has been discontinued at this time.  Neurosurgery recommended repeating CT scan which was performed without any change.  We will continue fall precautions, physical therapy occupational therapy evaluation recommend skilled nursing facility.  Informed TOC about it..   Continue hydration for acute kidney injury.     Subacute metabolic encephalopathy:  Likely multifactorial secondary to volume depletion AKI centrally acting medications on the background of underlying dementia.  Does have sundowning at home  She has been having persistent confusion since last hospitalization. We will optimize her situation as best as possible.  Chest x-ray with right hemidiaphragm elevation and persistent right upper lobe pulmonary lesion.    Psychiatry was consulted for possible suicidal ideation and encephalopathy. Communicated with psychiatry today and at this time Remeron and Lexapro has been discontinued.  Patient has been started on divalproex and melatonin.  We will monitor how she does overnight.    Acute kidney injury superimposed on CKD 3a:  Creatinine of 2.1 on presentation from recent baseline 1.3-1.5.  Patient received IV fluids with improvement to 1.3 today.  Baseline at this time.  Off IV fluids.  Monitor BMP periodically.   Subdural hematoma: Status post mechanical  fall.  Not on blood thinners.  Stable on repeat CT scan after 6 hours.  No other focal neurological deficits..  Neurosurgery from ED was consulted for recommended no  neurosurgical intervention and supportive care.  Continue fall precautions.  Avoid heparin products at this time.  Suicidal ideation:  Expressed in the ED.  Denies current ideation.  Currently IVCD.  Secondary school teacher at bedside.  Psychiatry following.  Hyperlipidemia:  We will resume statins.  CK level was 66  Chronic iron deficiency anemia:  INR of 1.1.  Not on blood thinners as outpatient. Latest hemoglobin of 10.3.    GERD: Continue H2 blocker.  Allergic rhinitis: Allegra on hold at this time.   Generalized debility, deconditioning.  Physical therapy has recommended  skilled nursing facility.  TOC has been consulted.     DVT prophylaxis: SCDs Start: 03/13/22 2337   Code Status:     Code Status: Full Code  Disposition: PT has recommended skilled nursing facility.  TOC consulted.  Status is: Inpatient  Remains inpatient appropriate because: Frequent falls, suicidal ideation, possible need for placement.   Family Communication:  Spoke with the patient's daughter Ms. Byrd Hesselbach updated her about the clinical condition of the patient.  She wishes her mom skilled nursing facility placement prior to coming home with home health.  Consultants:  Neurosurgery. Psychiatry  Procedures:  None  Antimicrobials:  None  Anti-infectives (From admission, onward)    None       Subjective: Today, patient was seen and examined at bedside.  Was initially sleeping but when I checked later on she was alert awake sitting at the bedside. Denied headache nausea vomiting fever or chills.  Sitter at bedside.   Objective: Vitals:   03/15/22 0517 03/15/22 0549 03/15/22 0807 03/15/22 1215  BP: (!) 165/81 (!) 154/71 (!) 159/63 119/61  Pulse: 72 85 76 80  Resp:   19 18  Temp:   97.6 F (36.4 C) 97.8 F (36.6 C)  TempSrc:   Oral Oral  SpO2:   100% 100%  Weight:      Height:        Intake/Output Summary (Last 24 hours) at 03/15/2022 1424 Last data filed at 03/14/2022 1950 Gross per 24  hour  Intake 120 ml  Output --  Net 120 ml    Filed Weights   03/12/22 1417 03/14/22 0500 03/15/22 0403  Weight: 62 kg 61.9 kg 60 kg    Physical Examination: Body mass index is 22.01 kg/m.  General:  Average built, not in obvious distress, alert awake and communicative. HENT:   No scleral pallor or icterus noted. Oral mucosa is moist.  Chest:    Diminished breath sounds bilaterally. No crackles or wheezes.  CVS: S1 &S2 heard. No murmur.  Regular rate and rhythm. Abdomen: Soft, nontender, nondistended.  Bowel sounds are heard.   Extremities: No cyanosis, clubbing or edema.  Peripheral pulses are palpable. Psych: Alert, awake and oriented, normal mood CNS:  No cranial nerve deficits.  Moves all the extremities, generalized weakness noted. Skin: Warm and dry.  No rashes noted.  Data Reviewed:   CBC: Recent Labs  Lab 03/12/22 1530 03/13/22 0757 03/14/22 0312 03/15/22 0330  WBC 4.5 3.7* 3.8* 5.2  NEUTROABS 2.9 1.9 2.3  --   HGB 10.0* 9.4* 9.3* 10.3*  HCT 31.7* 29.6* 29.5* 32.1*  MCV 91.9 92.5 93.1 92.8  PLT 222 196 218 210     Basic Metabolic Panel: Recent Labs  Lab 03/12/22 1530 03/13/22 0757 03/14/22  0973 03/15/22 0330  NA 139 140 140 141  K 4.7 4.3 4.4 3.9  CL 106 111 111 109  CO2 24 24 24 25   GLUCOSE 80 58* 135* 105*  BUN 33* 27* 26* 21  CREATININE 2.10* 1.66* 1.49* 1.39*  CALCIUM 9.3 8.7* 8.9 9.3  MG  --   --  2.0 2.1     Liver Function Tests: Recent Labs  Lab 03/12/22 2158 03/14/22 0312  AST 19 18  ALT 14 15  ALKPHOS 72 88  BILITOT 1.0 0.4  PROT 6.0* 5.1*  ALBUMIN 3.2* 2.8*      Radiology Studies: DG Chest Port 1 View  Result Date: 03/14/2022 CLINICAL DATA:  Generalized weakness. EXAM: PORTABLE CHEST 1 VIEW COMPARISON:  02/02/2022 FINDINGS: Stable marked eventration of the right hemidiaphragm with overlying vascular crowding and atelectasis. The heart is within normal limits in size. Stable tortuosity and calcification of the thoracic  aorta. Stable thyromegaly. Right upper lobe pulmonary nodule. No infiltrates, edema or effusions. The bony thorax is grossly intact. Stable scoliosis and degenerative changes involving the spine. Stable advanced bilateral shoulder joint degenerative changes. IMPRESSION: 1. 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 2. Persistent right upper lobe pulmonary lesion. 3. No acute pulmonary findings. Electronically Signed   By: 04/04/2022 M.D.   On: 03/14/2022 08:34      LOS: 2 days    05/14/2022, MD [facility] Available via Epic secure chat 7am-7pm After these hours, please refer to coverage provider listed on [email or web address] 03/15/2022, 2:24 PM

## 2022-03-15 NOTE — TOC Initial Note (Signed)
Transition of Care [facility]) - Initial/Assessment Note    Patient Details  Name: Nancy Suarez MRN: [id number] Date of Birth: 10-23-1931  [facility] CM/SW Contact:    Baldemar Lenis, LCSW Phone Number: 03/15/2022, 3:37 PM  Clinical Narrative:                CSW alerted by MD that patient's daughter, Byrd Hesselbach, asking about SNF at discharge. CSW spoke with PT and confirmed that patient would qualify for SNF placement. CSW called and spoke with daughter, Byrd Hesselbach. Per Byrd Hesselbach, patient recently at [facility] and was discharged home too early, she's had four falls since she has returned home. Daughter hopeful that patient can rehab some more to be more stable upon return home and less prone to falling. Daughter asking about [facility] as patient has been there before and excelled, but is aware that they stay full. CSW received permission to fax out referral, will follow with bed offers.   Expected Discharge Plan: Skilled Nursing Facility Barriers to Discharge: Continued Medical Work up, English as a second language teacher   Patient Goals and CMS Choice Patient states their goals for this hospitalization and ongoing recovery are:: patient unable to participate in goal setting, only oriented to self CMS Medicare.gov Compare Post Acute Care list provided to:: Patient Represenative (must comment) Choice offered to / list presented to : Adult Children  Expected Discharge Plan and Services Expected Discharge Plan: Skilled Nursing Facility       Living arrangements for the past 2 months: Single Family Home                                      Prior Living Arrangements/Services Living arrangements for the past 2 months: Single Family Home Lives with:: Adult Children Patient language and need for interpreter reviewed:: No Do you feel safe going back to the place where you live?: Yes      Need for Family Participation in Patient Care: Yes (Comment) Care giver support system in place?: Yes  (comment)   Criminal Activity/Legal Involvement Pertinent to Current Situation/Hospitalization: No - Comment as needed  Activities of Daily Living      Permission Sought/Granted Permission sought to share information with : Facility Medical sales representative, Family Supports Permission granted to share information with : Yes, Verbal Permission Granted  Share Information with NAME: Byrd Hesselbach  Permission granted to share info w AGENCY: SNF  Permission granted to share info w Relationship: Daughter     Emotional Assessment   Attitude/Demeanor/Rapport: Unable to Assess Affect (typically observed): Unable to Assess Orientation: : Oriented to Self Alcohol / Substance Use: Not Applicable Psych Involvement: No (comment)  Admission diagnosis:  Suicidal ideation [R45.851] Subdural hematoma (HCC) [S06.5XAA] Patient Active Problem List   Diagnosis Date Noted   Frequent falls 03/14/2022   Suicidal ideation 03/14/2022   Generalized weakness 03/14/2022   Subdural hematoma (HCC) 03/12/2022   Acute encephalopathy 02/28/2022   Subarachnoid hemorrhage (HCC) 02/28/2022   Allergic rhinitis 02/28/2022   Hip fracture (HCC) 11/27/2021   Normocytic anemia 11/27/2021   Physical deconditioning 04/16/2021   Essential hypertension 06/05/2017   HLD (hyperlipidemia) 06/05/2017   Depression 06/05/2017   Chronic iron deficiency anemia 06/05/2017   Acute renal failure superimposed on stage 3a chronic kidney disease (HCC) 06/05/2017   Closed fracture of left distal femur (HCC) 06/05/2017   Fall at home, initial encounter    Overweight (BMI 25.0-29.9) 01/21/2014  Postoperative anemia due to acute blood loss 01/21/2014   S/P left TKA 01/19/2014   Scoliosis    Osteopenia    Chronic insomnia    Atrophic vaginitis    Spinal arthritis    GERD (gastroesophageal reflux disease)    ASCUS (atypical squamous cells of undetermined significance) on Pap smear    Ovarian cyst    LGSIL (low grade squamous  intraepithelial dysplasia)    PCP:  Corine Shelter, MD Pharmacy:   [facility] [phone] Pura Spice, [place] - [address] AT [facility] OF [address] [place] [address] Phone: [phone] Fax: [phone]     Social Determinants of Health (SDOH) Interventions    Readmission Risk Interventions    12/01/2021    1:14 PM  Readmission Risk Prevention Plan  Transportation Screening Complete  PCP or Specialist Appt within 3-5 Days Complete  HRI or Home Care Consult Complete  Social Work Consult for Recovery Care Planning/Counseling Complete  Palliative Care Screening Complete  Medication Review Oceanographer) Complete

## 2022-03-15 NOTE — Progress Notes (Signed)
At approx. 0005 this RN was alerted by another RN that pt was yelling belligerently at [employer]. This RN entered room and attempted to verbally deescalate pt who was clearly agitated and confused with regard to her location, time of day, and what brought her to the hospital. De-escalation unsuccessful. Pt repeatedly attempting to exit bed without assistance and swatting and cursing at staff. Attempted to call daughter to assist in reorienting pt at approximately 0010 with no response. MD (Dr. Margo Aye) paged @ [phone] regarding situation and IM Ativan order received. Attempted to call daughter again @0025  prior to administration of IM Ativan again with no response. Pt educated on necessity of Ativan and medication given IM @0035  with assistance of 2 additional staff members.  Thus far pt remains agitated in bed.  MD (Dr. ) paged regarding pt's lack of response to medication @0051 . No response received.  MD (Dr. ) re-paged @0118 . Order received for repeat dose of IM Ativan.  Pt remained agitated and MD (Dr. Margo Aye) paged @0214 . Restraint order received and implemented. Daughter unable to be reached at that time.  By approx 0300 pt resting comfortably in bed.  @0730   this RN spoke with pt's daughter on phone and explained the necessity of restraints and that they would be removed as soon as safely possible. Daughter amenable to plan and endorsed she would be here around 0830. Day RN updated.

## 2022-03-15 NOTE — Progress Notes (Signed)
Mobility Specialist: Progress Note   03/15/22 1506  Mobility  Activity Ambulated with assistance in hallway  Level of Assistance Moderate assist, patient does 50-74%  Assistive Device Front wheel walker  Distance Ambulated (ft) 60 ft  Activity Response Tolerated well  $Mobility charge 1 Mobility   Pt received in the bed sleeping, able to wake and agreeable to mobility. ModA with bed mobility and minA to stand. To BR, void successful. Pt to the chair after session with NT present in the room.   [facility] Derin Granquist Mobility Specialist Mobility Specialist [address]: [phone]

## 2022-03-15 NOTE — Consult Note (Signed)
Brief Psychiatry Consult Note   Date of service: March 15, 2022 Patient Name: Nancy Suarez DOB: Jul 01, 1932 MRN: [id number] Reason for Consult: "Depression and SI with plan" Referring Provider: Newton Pigg, DO  TRIXIE MACLAREN is a 88 y.o. female admitted medically for 03/12/2022  2:08 PM for frequent falls from ground-level with recent admission 8/29 with subarachnoid hemorrhage after ground-level fall without loss of consciousness. She carries the psychiatric diagnosis of depression and has a past medical history of chronic IDA, HLD, and CKD stage IIIa.  Patient had a difficult night last night, requiring as needed Ativan as well as soft restraints for agitation.  This morning, patient reports that she had difficulty sleeping last night due to an unknown couple lying at the foot of her bed.  She is a bit more confused than previous day. Per conversation with her daughter, she is agreeable to finding an agitation protocol that is sufficient for her mother.  Patient denies SI, HI, and further AVH beyond the couple.  Her presenting symptoms remain more consistent with sundowning of dementia than organic psychosis.  She should continue to have the 1:1 sitter for safety, as she is a high fall risk.  The decision remains that she does not meet criteria for inpatient psychiatric admission.  Recommendations: - Start Depakote sprinkles 250 mg nightly for agitation: Avoid benzodiazepines in this patient as this can worsen agitation. - Continue Lexapro 10 mg for depressive symptoms - Discontinue Remeron 30 mg, as alpha blockade may be contributing to falls. -Would recommend continuing sitter, but will leave to the discretion of primary team.   This plan has been discussed with the primary team.  Psychiatry will continue to follow at this time.   Signed: Lamar Sprinkles, MD Psychiatry Resident, PGY-2 [facility] 03/15/2022, 3:07 PM

## 2022-03-16 DIAGNOSIS — R296 Repeated falls: Secondary | ICD-10-CM | POA: Diagnosis not present

## 2022-03-16 LAB — CBC
HCT: 29.8 % — ABNORMAL LOW (ref 36.0–46.0)
Hemoglobin: 9.6 g/dL — ABNORMAL LOW (ref 12.0–15.0)
MCH: 29.8 pg (ref 26.0–34.0)
MCHC: 32.2 g/dL (ref 30.0–36.0)
MCV: 92.5 fL (ref 80.0–100.0)
Platelets: 213 10*3/uL (ref 150–400)
RBC: 3.22 MIL/uL — ABNORMAL LOW (ref 3.87–5.11)
RDW: 15.6 % — ABNORMAL HIGH (ref 11.5–15.5)
WBC: 3.7 10*3/uL — ABNORMAL LOW (ref 4.0–10.5)
nRBC: 0 % (ref 0.0–0.2)

## 2022-03-16 LAB — BASIC METABOLIC PANEL
Anion gap: 7 (ref 5–15)
BUN: 26 mg/dL — ABNORMAL HIGH (ref 8–23)
CO2: 24 mmol/L (ref 22–32)
Calcium: 9 mg/dL (ref 8.9–10.3)
Chloride: 110 mmol/L (ref 98–111)
Creatinine, Ser: 1.45 mg/dL — ABNORMAL HIGH (ref 0.44–1.00)
GFR, Estimated: 35 mL/min — ABNORMAL LOW (ref >60)
Glucose, Bld: 100 mg/dL — ABNORMAL HIGH (ref 70–99)
Potassium: 4.8 mmol/L (ref 3.5–5.1)
Sodium: 141 mmol/L (ref 135–145)

## 2022-03-16 LAB — MAGNESIUM: Magnesium: 2.1 mg/dL (ref 1.7–2.4)

## 2022-03-16 MED ORDER — RISPERIDONE 0.5 MG PO TABS: 0.5000 mg | ORAL_TABLET | Freq: Two times a day (BID) | ORAL | Status: DC | PRN

## 2022-03-16 MED ORDER — RISPERIDONE 0.5 MG PO TABS: 0.5000 mg | ORAL_TABLET | Freq: Every evening | ORAL | Status: DC | PRN

## 2022-03-16 MED ORDER — OLANZAPINE 10 MG IM SOLR
5.0000 mg | Freq: Once | INTRAMUSCULAR | Status: AC
  Administered 2022-03-16: 5 mg via INTRAMUSCULAR
  Filled 2022-03-16: qty 10

## 2022-03-16 MED ORDER — LORAZEPAM 2 MG/ML IJ SOLN
1.0000 mg | Freq: Once | INTRAMUSCULAR | Status: AC
  Administered 2022-03-16: 1 mg via INTRAVENOUS
  Filled 2022-03-16: qty 1

## 2022-03-16 MED ORDER — HALOPERIDOL LACTATE 5 MG/ML IJ SOLN: 1.0000 mg | Freq: Once | INTRAMUSCULAR | Status: DC

## 2022-03-16 MED ORDER — FAMOTIDINE 10 MG PO TABS
10.0000 mg | ORAL_TABLET | Freq: Every day | ORAL | Status: DC
  Administered 2022-03-17 – 2022-03-31 (×15): 10 mg via ORAL
  Filled 2022-03-16 (×16): qty 1

## 2022-03-16 NOTE — Significant Event (Signed)
I was notified by the patient's nurse that patient was getting agitated and trying to get out of the bed.  Patient was screaming.  On exam at bedside patient was confused delirious trying to pull out her cords and lines.  Reviewed patient's chart labs medications.  Reviewed patient's vital signs patient is afebrile blood pressure is 150/70 respiration 20/min temperature 98.1 pulse 80/min.  Patient on exam is oriented to her name only.  Moving all extremities.  Patient was placed on restraints ordered 1 mg IM Ativan and followed by 5 mg of IM Zyprexa.  We will continue to observe.  Midge Minium

## 2022-03-16 NOTE — Progress Notes (Signed)
Patient is not yelling currently, is still being extremely verbally aggressive, using foul language, making threat, and not allowing any staff member to provide any care for her. Is talking more quietly to herself while cursing and threatening to "beat that bitch ass the next time she walks by me". Does increase in volume and agitation whenever I look at her or walk near her bed.

## 2022-03-16 NOTE — Progress Notes (Signed)
Mobility Specialist: Progress Note   03/16/22 1115  Mobility  Activity Ambulated with assistance in hallway  Level of Assistance Minimal assist, patient does 75% or more  Assistive Device Front wheel walker  Distance Ambulated (ft) 92 ft  Activity Response Tolerated well  $Mobility charge 1 Mobility   Pt received in the chair and agreeable to mobility. No c/o throughout. Required verbal cues and minA with RW management during ambulation. Pt back to the chair after session with call bell at her side.   [facility] Colin Norment Mobility Specialist Mobility Specialist [address]: [phone]

## 2022-03-16 NOTE — Progress Notes (Signed)
Patient agitated and paranoid. Refusing medication and threatening staff.   Safety ensured, sitter at bedside. Will continue to monitor.

## 2022-03-16 NOTE — Progress Notes (Addendum)
Attempting to reach out to family. With out response.

## 2022-03-16 NOTE — Progress Notes (Signed)
Patient yelling out foul language at staff.

## 2022-03-16 NOTE — Progress Notes (Signed)
Patient assisted into the bed, restraints were applied per order. Safety ensured.  Sitter at bedside, will monitor closely.

## 2022-03-16 NOTE — Progress Notes (Signed)
PROGRESS NOTE    Nancy Suarez  [id number] DOB: August 01, 1931 DOA: 03/12/2022 PCP: Vincente Liberty, MD    Brief Narrative:  Nancy Suarez is a 88 y.o. female with medical history significant for recent admission for subarachnoid hemorrhage, chronic iron deficiency anemia associated with baseline hemoglobin 7.5-10, hyperlipidemia, depression, GERD, CKD 3a with baseline creatinine 1.3-1.5, was admitted to [facility] on 03/12/2022 as a transfer from med center [facility] with complaints of falls.  Of note , patient was recently hospitalized at [facility] from 02/28/2022 to 03/01/2022, with subarachnoid hemorrhage after ground-level mechanical fall without loss of consciousness, for which the patient's case and imaging were discussed with neurosurgery who felt no indication for neurosurgical intervention.  The patient was subsequently discharged home, where she lives with her daughter.  Patient again had a ground-level fall on 03/12/22 with concerns for hitting her head and complains of generalized weakness.  She was little more confused than baseline to was brought to the hospital..  In the ED creatinine was 2.1 compared to previous creatinine of 1.4 on 03/01/2022.  Ammonia less than 10.  TSH 3.261.  WBC was 4500, hemoglobin 10. Urinalysis showed no evidence of white blood cells.  Noncontrast CT head on 03/12/2022 showed small amount of subdural blood along the posterior falx without mass effect or midline shift and no evidence of intraparenchymal hemorrhage.  ED provider again spoke with on-call neurosurgery (Dr. Dory Larsen) who recommended repeating CT scan in 6 hours and then repeat a CT scan without much interval change.  Patient was IVCD admitted since she was trying to throw herself down on the floor to hurt herself.  She received sedation including Haldol Ativan and was admitted hospital for evaluation of recurrent falls.    03/16/22:  Patient was seen and examined at bedside.  She states she  feels better.  She has no new complaints.  Assessment and plan. Principal Problem:   Frequent falls Active Problems:   GERD (gastroesophageal reflux disease)   HLD (hyperlipidemia)   Chronic iron deficiency anemia   Acute renal failure superimposed on stage 3a chronic kidney disease (HCC)   Acute encephalopathy   Allergic rhinitis   Subdural hematoma (HCC)   Suicidal ideation   Generalized weakness   Recurrent falls:  At least 2 ground-level falls in the last 2 weeks.  CT head showing new small subdural hematoma.  Was on benzodiazepines for prolonged duration at home which has been discontinued at this time.  Neurosurgery recommended repeating CT scan which was performed without any change.  We will continue fall precautions, physical therapy occupational therapy evaluation recommend skilled nursing facility.  Informed TOC about it..   Continue hydration for acute kidney injury.    Subacute metabolic encephalopathy:  Likely multifactorial secondary to volume depletion AKI centrally acting medications on the background of underlying dementia.  Does have sundowning at home  She has been having persistent confusion since last hospitalization. We will optimize her situation as best as possible.  Chest x-ray with right hemidiaphragm elevation and persistent right upper lobe pulmonary lesion.    Psychiatry was consulted for possible suicidal ideation and encephalopathy. Communicated with psychiatry today and at this time Remeron and Lexapro has been discontinued.  Patient has been started on divalproex and melatonin.  We will monitor how she does overnight.    Acute kidney injury superimposed on CKD 3a:  Creatinine of 2.1 on presentation from recent baseline 1.3-1.5.  Patient received IV fluids with improvement to 1.3 today.  Baseline at this time.  Off IV fluids.  Monitor BMP periodically.   Subdural hematoma: Status post mechanical fall.  Not on blood thinners.  Stable on repeat CT scan after 6  hours.  No other focal neurological deficits..  Neurosurgery from ED was consulted for recommended no neurosurgical intervention and supportive care.  Continue fall precautions.  Avoid heparin products at this time.  Suicidal ideation:  Expressed in the ED.  Denies current ideation.  Currently IVCD.  Secondary school teacher at bedside.  Psychiatry following.  Hyperlipidemia:  We will resume statins.  CK level was 66  Chronic iron deficiency anemia:  INR of 1.1.  Not on blood thinners as outpatient. Latest hemoglobin of 10.3.    GERD: Continue H2 blocker.  Allergic rhinitis: Allegra on hold at this time.   Generalized debility, deconditioning.  Physical therapy has recommended  skilled nursing facility.  TOC has been consulted.     DVT prophylaxis: SCDs Start: 03/13/22 2337   Code Status:     Code Status: Full Code  Disposition: PT has recommended skilled nursing facility.  TOC consulted.  Status is: Inpatient  Remains inpatient appropriate because: Frequent falls, suicidal ideation, possible need for placement.   Family Communication:  Spoke with the patient's daughter Ms. Byrd Hesselbach updated her about the clinical condition of the patient.  She wishes her mom skilled nursing facility placement prior to coming home with home health.  Consultants:  Neurosurgery. Psychiatry  Procedures:  None  Antimicrobials:  None  Anti-infectives (From admission, onward)    None       Objective: Vitals:   03/16/22 0354 03/16/22 0454 03/16/22 0737 03/16/22 1158  BP:  (!) 151/72 131/63 123/60  Pulse:  93 69 82  Resp:  16 16 20   Temp:  98.2 F (36.8 C) 98.9 F (37.2 C) 98.1 F (36.7 C)  TempSrc:  Axillary Oral Oral  SpO2:  98% 100% 98%  Weight: 60 kg     Height:        Intake/Output Summary (Last 24 hours) at 03/16/2022 1337 Last data filed at 03/16/2022 0925 Gross per 24 hour  Intake 520 ml  Output 0 ml  Net 520 ml   Filed Weights   03/14/22 0500 03/15/22 0403 03/16/22 0354   Weight: 61.9 kg 60 kg 60 kg    Physical Examination:  No new changes Body mass index is 22.01 kg/m.  General:  Average built, not in obvious distress, alert awake and communicative. HENT:   No scleral pallor or icterus noted. Oral mucosa is moist.  Chest:    Diminished breath sounds bilaterally. No crackles or wheezes.  CVS: S1 &S2 heard. No murmur.  Regular rate and rhythm. Abdomen: Soft, nontender, nondistended.  Bowel sounds are heard.   Extremities: No cyanosis, clubbing or edema.  Peripheral pulses are palpable. Psych: Alert, awake and oriented, normal mood CNS:  No cranial nerve deficits.  Moves all the extremities, generalized weakness noted. Skin: Warm and dry.  No rashes noted.  Data Reviewed:   CBC: Recent Labs  Lab 03/12/22 1530 03/13/22 0757 03/14/22 0312 03/15/22 0330 03/16/22 0451  WBC 4.5 3.7* 3.8* 5.2 3.7*  NEUTROABS 2.9 1.9 2.3  --   --   HGB 10.0* 9.4* 9.3* 10.3* 9.6*  HCT 31.7* 29.6* 29.5* 32.1* 29.8*  MCV 91.9 92.5 93.1 92.8 92.5  PLT 222 196 218 210 213    Basic Metabolic Panel: Recent Labs  Lab 03/12/22 1530 03/13/22 0757 03/14/22 0312 03/15/22 0330 03/16/22 0451  NA 139 140 140 141 141  K 4.7 4.3 4.4 3.9 4.8  CL 106 111 111 109 110  CO2 24 24 24 25 24   GLUCOSE 80 58* 135* 105* 100*  BUN 33* 27* 26* 21 26*  CREATININE 2.10* 1.66* 1.49* 1.39* 1.45*  CALCIUM 9.3 8.7* 8.9 9.3 9.0  MG  --   --  2.0 2.1 2.1    Liver Function Tests: Recent Labs  Lab 03/12/22 2158 03/14/22 0312  AST 19 18  ALT 14 15  ALKPHOS 72 88  BILITOT 1.0 0.4  PROT 6.0* 5.1*  ALBUMIN 3.2* 2.8*     Radiology Studies: No results found.    LOS: 3 days    05/14/22, MD [facility] Available via Epic secure chat 7am-7pm After these hours, please refer to coverage provider listed on [email or web address] 03/16/2022, 1:37 PM

## 2022-03-16 NOTE — Progress Notes (Signed)
This NT/Sitter attempted to check patient's vital signs. Patent has been verbally aggressive and making threats towards me since start of shift at 1900.I did ask several times if I could please check her blood pressure and vital signs to which she replied "don't touch me! I don't like you and I don't want you to do nothing to me". She then tried to hit me so I will allow another staff member to try later if patient is more cooperative.

## 2022-03-16 NOTE — Progress Notes (Signed)
Patient extremely paranoid, throwing equipment at staff.

## 2022-03-16 NOTE — Progress Notes (Signed)
Patient attempting to hit staff, throwing tele monitor at sitter, and throwing water at staff.   Patient is paranoid. Refusing care.

## 2022-03-16 NOTE — Progress Notes (Signed)
Zyprexa administered per order, safety ensured.

## 2022-03-16 NOTE — Progress Notes (Signed)
Patient becoming more irritated and agitated as the evening goes on. She is very suspicious of all staff, does not want [place] to help her to the bathroom and does not want to take her medicine. She is hallucinating, seeing bugs and fish and keeps repeating herself over and over.

## 2022-03-16 NOTE — Care Management Important Message (Signed)
Important Message  Patient Details  Name: Nancy Suarez MRN: [id number] Date of Birth: 07-16-1931   Medicare Important Message Given:  Yes     Sanjiv Castorena Stefan Church 03/16/2022, 2:50 PM

## 2022-03-16 NOTE — Progress Notes (Signed)
Occupational Therapy Treatment Patient Details Name: Nancy Suarez MRN: [id number] DOB: 20-Oct-1931 Today's Date: 03/16/2022   History of present illness Pt is a 88 y/o female presenting on 9/10 after fall. CT 9/10 with small subdural along posterior falx, follow up CT 9/11 with unchanged subdural and subacute L lateral frontal cortical infarct. Noted admission 8/29-8/30 with subarachnoid hemorrhage after fall. PMH includes: HTN, scoliosis, R intertrochanteric IM nail 2023, ORIF femur 2018, L TKA 2015, dementia.   OT comments  Patient seated in recliner, eager to participate in OT session.  Completing functional mobility with min assist with RW, min assist for toilet transfers and max assist for toileting.  Standing/sitting at sink engaging in grooming with min-mod cueing for sequencing, problem solving and completion of tasks, min assist for balance.  Updated dc plan to SNF to optimize safety and prevent fall risk prior to return home.  Will follow.    Recommendations for follow up therapy are one component of a multi-disciplinary discharge planning process, led by the attending physician.  Recommendations may be updated based on patient status, additional functional criteria and insurance authorization.    Follow Up Recommendations  Skilled nursing-short term rehab (<3 hours/day)    Assistance Recommended at Discharge Frequent or constant Supervision/Assistance  Patient can return home with the following  A little help with walking and/or transfers;A lot of help with bathing/dressing/bathroom;Assistance with cooking/housework;Direct supervision/assist for medications management;Direct supervision/assist for financial management;Assist for transportation;Help with stairs or ramp for entrance   Equipment Recommendations  Other (comment) (defer)    Recommendations for Other Services      Precautions / Restrictions Precautions Precautions: Fall Restrictions Weight Bearing Restrictions: No        Mobility Bed Mobility               General bed mobility comments: OOB upon entry in recliner    Transfers Overall transfer level: Needs assistance Equipment used: Rolling walker (2 wheels) Transfers: Sit to/from Stand Sit to Stand: Min assist           General transfer comment: min assist to power up and steady, cueing for hand placement     Balance Overall balance assessment: Needs assistance Sitting-balance support: No upper extremity supported, Feet supported Sitting balance-Leahy Scale: Fair     Standing balance support: Bilateral upper extremity supported, No upper extremity supported, During functional activity Standing balance-Leahy Scale: Poor Standing balance comment: relies on BUE and external support, brief 1 UE support during grooming tasks but leaning on sink as well                           ADL either performed or assessed with clinical judgement   ADL Overall ADL's : Needs assistance/impaired     Grooming: Wash/dry hands;Oral care;Minimal assistance;Sitting Grooming Details (indicate cue type and reason): for balance standing, cueing to sequence tasks and cueing for problem solving. Cueing to turn water off after every task, and to remove lid of toothpaste.                 Toilet Transfer: Minimal assistance;Ambulation;Rolling walker (2 wheels);Grab bars Toilet Transfer Details (indicate cue type and reason): safety, hand placement Toileting- Clothing Manipulation and Hygiene: Maximal assistance;Sit to/from stand Toileting - Clothing Manipulation Details (indicate cue type and reason): clothing mgmt, balance     Functional mobility during ADLs: Minimal assistance;Rolling walker (2 wheels);Cueing for safety      Extremity/Trunk Assessment  Vision       Perception     Praxis      Cognition Arousal/Alertness: Awake/alert Behavior During Therapy: WFL for tasks assessed/performed Overall  Cognitive Status: History of cognitive impairments - at baseline                                 General Comments: remembers therapist from prior session, but difficulty sequencing simple self care tasks without cueing, decreased problem solving.        Exercises      Shoulder Instructions       General Comments VSS, sitter at side    Pertinent Vitals/ Pain       Pain Assessment Pain Assessment: Faces Faces Pain Scale: No hurt Pain Intervention(s): Monitored during session  Home Living                                          Prior Functioning/Environment              Frequency  Min 2X/week        Progress Toward Goals  OT Goals(current goals can now be found in the care plan section)  Progress towards OT goals: Progressing toward goals  Acute Rehab OT Goals Patient Stated Goal: home OT Goal Formulation: With patient Time For Goal Achievement: 03/28/22 Potential to Achieve Goals: Good  Plan Frequency remains appropriate;Discharge plan needs to be updated    Co-evaluation                 AM-PAC OT "6 Clicks" Daily Activity     Outcome Measure   Help from another person eating meals?: None Help from another person taking care of personal grooming?: A Little Help from another person toileting, which includes using toliet, bedpan, or urinal?: A Lot Help from another person bathing (including washing, rinsing, drying)?: A Lot Help from another person to put on and taking off regular upper body clothing?: A Little Help from another person to put on and taking off regular lower body clothing?: A Little 6 Click Score: 17    End of Session Equipment Utilized During Treatment: Gait belt;Rolling walker (2 wheels)  OT Visit Diagnosis: Unsteadiness on feet (R26.81);Muscle weakness (generalized) (M62.81);Other symptoms and signs involving cognitive function   Activity Tolerance Patient tolerated treatment well   Patient  Left in chair;with call bell/phone within reach;with nursing/sitter in room   Nurse Communication Mobility status        Time: 1130-1151 OT Time Calculation (min): 21 min  Charges: OT General Charges $OT Visit: 1 Visit OT Treatments $Self Care/Home Management : 8-22 mins  Barry Brunner, OT Acute Rehabilitation Services Office [phone]   Chancy Milroy 03/16/2022, 12:41 PM

## 2022-03-17 ENCOUNTER — Inpatient Hospital Stay (HOSPITAL_COMMUNITY): Payer: Medicare Other

## 2022-03-17 DIAGNOSIS — R296 Repeated falls: Secondary | ICD-10-CM | POA: Diagnosis not present

## 2022-03-17 MED ORDER — DIVALPROEX SODIUM 125 MG PO CSDR
125.0000 mg | DELAYED_RELEASE_CAPSULE | Freq: Once | ORAL | Status: AC
  Administered 2022-03-17: 125 mg via ORAL
  Filled 2022-03-17: qty 1

## 2022-03-17 MED ORDER — HALOPERIDOL LACTATE 5 MG/ML IJ SOLN
2.0000 mg | Freq: Every evening | INTRAMUSCULAR | Status: DC | PRN
  Administered 2022-03-21: 2 mg via INTRAMUSCULAR
  Filled 2022-03-17 (×2): qty 1

## 2022-03-17 MED ORDER — RISPERIDONE 0.5 MG PO TABS
0.5000 mg | ORAL_TABLET | Freq: Every evening | ORAL | Status: DC | PRN
  Administered 2022-03-17 – 2022-03-22 (×3): 0.5 mg via ORAL
  Filled 2022-03-17 (×3): qty 1

## 2022-03-17 MED ORDER — DIVALPROEX SODIUM 125 MG PO CSDR
125.0000 mg | DELAYED_RELEASE_CAPSULE | Freq: Two times a day (BID) | ORAL | Status: DC
  Administered 2022-03-18 – 2022-03-24 (×13): 125 mg via ORAL
  Filled 2022-03-17 (×14): qty 1

## 2022-03-17 NOTE — Consult Note (Signed)
Brief Psychiatry Consult Note   Date of service: March 17, 2022 Patient Name: MAECY PODGURSKI DOB: 30-Jul-1931 MRN: [id number] Reason for Consult: "Depression and SI with plan" Referring Provider: Newton Pigg, DO  FRADY TADDEO is a 88 y.o. female admitted medically for 03/12/2022  2:08 PM for frequent falls from ground-level with recent admission 8/29 with subarachnoid hemorrhage after ground-level fall without loss of consciousness. She carries the psychiatric diagnosis of depression and has a past medical history of chronic IDA, HLD, and CKD stage IIIa.  Patient had a difficult night last night, requiring as needed Ativan and Zyprexa IM as well as soft restraints for agitation.  When sundowning, patient became paranoid, and refused to take all medications, including Depakote, to which she previously responded well.  This morning, patient continues to report visual hallucinations of the unknown couple in her room and commanding her to saying.  Patient is oriented to person, place (hospital), and situation, but not time (consistent with previous assessment). Per conversation with her daughter, she is would like guidance on agitation protocol and the best option for patient moving forward in order to prevent falls and acclimate back to her home life appropriately.  This Clinical research associate advises that our team discontinued the mirtazapine and Atarax, both of which may have been contributing to falls, and that her primary team will continue to review other medications.  As well, patient will work with the physical and occupational therapists while admitted, and the recommendation is for SNF placement after discharge.  Her daughter is agreeable with this plan, and appreciative for the assistance in her mother's care.  Today, patient denies SI, HI, and further AVH beyond the couple.  Her presenting symptoms remain more consistent with sundowning of dementia..  She should continue to have the 1:1 sitter for safety, as  she is a high fall risk.  The decision remains that she does not meet criteria for inpatient psychiatric admission.  Recommendations: - Increase to Depakote sprinkles 125 mg twice daily, with breakfast and dinner, for agitation -Risperdal 0.5 mg p.o. OR Haldol 2 mg IM available as needed at bedtime for moderate to severe agitation; Avoid benzodiazepines in this patient as this can worsen agitation. - Continue Lexapro 10 mg for depressive symptoms - Previously discontinued Remeron 30 mg, as alpha blockade may be contributing to falls. -Would recommend continuing sitter, but will leave to the discretion of primary team.   This plan has been discussed with the primary team.  Psychiatry will continue to follow for medication management at this time, until proper agitation protocol has been identified.   Signed: Lamar Sprinkles, MD Psychiatry Resident, PGY-2 [facility] 03/17/2022, 12:55 PM

## 2022-03-17 NOTE — Progress Notes (Signed)
PROGRESS NOTE    Nancy Suarez  [id number] DOB: [birth year] DOA: 03/12/2022 PCP: Vincente Liberty, MD    Brief Narrative:  Nancy Suarez is a 88 y.o. female with medical history significant for recent admission for subarachnoid hemorrhage, chronic iron deficiency anemia associated with baseline hemoglobin 7.5-10, hyperlipidemia, depression, GERD, CKD 3a with baseline creatinine 1.3-1.5, was admitted to [facility] on 03/12/2022 as a transfer from med center [facility] with complaints of falls.  Of note , patient was recently hospitalized at [facility] from 02/28/2022 to 03/01/2022, with subarachnoid hemorrhage after ground-level mechanical fall without loss of consciousness, for which the patient's case and imaging were discussed with neurosurgery who felt no indication for neurosurgical intervention.  The patient was subsequently discharged home, where she lives with her daughter.  Patient again had a ground-level fall on 03/12/22 with concerns for hitting her head and complains of generalized weakness.  She was little more confused than baseline to was brought to the hospital..  In the ED creatinine was 2.1 compared to previous creatinine of 1.4 on 03/01/2022.  Ammonia less than 10.  TSH 3.261.  WBC was 4500, hemoglobin 10. Urinalysis showed no evidence of white blood cells.  Noncontrast CT head on 03/12/2022 showed small amount of subdural blood along the posterior falx without mass effect or midline shift and no evidence of intraparenchymal hemorrhage.  ED provider again spoke with on-call neurosurgery (Dr. Dory Larsen) who recommended repeating CT scan in 6 hours and then repeat a CT scan without much interval change.  Patient was IVCD admitted since she was trying to throw herself down on the floor to hurt herself.  She received sedation including Haldol Ativan and was admitted hospital for evaluation of recurrent falls.    03/17/22: Sundowning overnight.  Confusion, irritable mood waxing and  waning.  Refusing to take her medications.  Assessment and plan. Principal Problem:   Frequent falls Active Problems:   GERD (gastroesophageal reflux disease)   HLD (hyperlipidemia)   Chronic iron deficiency anemia   Acute renal failure superimposed on stage 3a chronic kidney disease (HCC)   Acute encephalopathy   Allergic rhinitis   Subdural hematoma (HCC)   Suicidal ideation   Generalized weakness   Recurrent falls:  At least 2 ground-level falls in the last 2 weeks.  CT head showing new small subdural hematoma.  Was on benzodiazepines for prolonged duration at home.  Neurosurgery recommended repeating CT scan which was performed without any change.   Continue fall precautions Physical therapy occupational therapy evaluation recommend skilled nursing facility.     Subacute metabolic encephalopathy:  Likely multifactorial secondary to volume depletion AKI centrally acting medications on the background of underlying dementia.  Does have sundowning at home   She has been having persistent confusion since last hospitalization. We will optimize her situation as best as possible.   Chest x-ray with right hemidiaphragm elevation and persistent right upper lobe pulmonary lesion.   Psychiatry following for possible suicidal ideation and encephalopathy.  Continue psych medications recommended by psychiatry   Acute kidney injury superimposed on CKD 3a:  Creatinine of 2.1 on presentation from recent baseline 1.3-1.5.   Avoid dehydration, encourage oral fluid intake.   Slight increase in creatinine 1.45 from 1.39.   Continue to avoid nephrotoxic agents.  Subdural hematoma: Status post mechanical fall.  Not on blood thinners.  Stable on repeat CT scan after 6 hours.  No other focal neurological deficits..  Neurosurgery from ED was consulted for recommended neurosurgical  intervention and supportive care.  Continue fall precautions.    Suicidal ideation:  Expressed in the ED.   Currently  IVCD.   Continue one-to-one sitter at bedside.   Appreciate psychiatry assistance  Hyperlipidemia:  Continue Lipitor 20 mg daily    GERD: Continue H2 blocker.  Anemia of chronic disease No overt bleeding reported Continue to monitor H&H  Allergic rhinitis: Allegra on hold at this time.   Generalized debility, deconditioning.  Physical therapy has recommended  skilled nursing facility.  TOC has been consulted.     DVT prophylaxis: SCDs Start: 03/13/22 2337   Code Status:     Code Status: Full Code  Disposition: PT has recommended skilled nursing facility.  TOC consulted.  Status is: Inpatient  Remains inpatient appropriate because: Frequent falls, suicidal ideation, possible need for placement.   Family Communication:  None at bedside  Consultants:  Neurosurgery. Psychiatry  Procedures:  None  Antimicrobials:  None  Anti-infectives (From admission, onward)    None       Objective: Vitals:   03/17/22 0420 03/17/22 0425 03/17/22 0832 03/17/22 1212  BP: (!) 154/87  (!) 148/48 139/62  Pulse: 60  77 88  Resp: 14  18 20   Temp: 97.8 F (36.6 C)  98.2 F (36.8 C) 98.1 F (36.7 C)  TempSrc: Oral  Axillary Oral  SpO2: 100%  100% 100%  Weight:  60 kg    Height:        Intake/Output Summary (Last 24 hours) at 03/17/2022 1644 Last data filed at 03/17/2022 1100 Gross per 24 hour  Intake 240 ml  Output 400 ml  Net -160 ml   Filed Weights   03/15/22 0403 03/16/22 0354 03/17/22 0425  Weight: 60 kg 60 kg 60 kg    Physical Examination:  No new changes Body mass index is 22.01 kg/m.  General: Well developed well nourished in no acute stress.  She is alert and interactive. HENT:   No scleral pallor or icterus noted. Oral mucosa is moist.  Chest:   Clear to auscultation no wheeze or rales.   CVS: Regular rate and rhythm no rubs or gallops. Abdomen: Soft, nontender, nondistended.  Bowel sounds are heard.   Extremities: No lower extremity edema.   Psych:  Mood is appropriate at the time of this exam. CNS:  No cranial nerve deficits.  Moves all the extremities, generalized weakness noted. Skin: Warm and dry.  No rashes noted.  Data Reviewed:   CBC: Recent Labs  Lab 03/12/22 1530 03/13/22 0757 03/14/22 0312 03/15/22 0330 03/16/22 0451  WBC 4.5 3.7* 3.8* 5.2 3.7*  NEUTROABS 2.9 1.9 2.3  --   --   HGB 10.0* 9.4* 9.3* 10.3* 9.6*  HCT 31.7* 29.6* 29.5* 32.1* 29.8*  MCV 91.9 92.5 93.1 92.8 92.5  PLT 222 196 218 210 213    Basic Metabolic Panel: Recent Labs  Lab 03/12/22 1530 03/13/22 0757 03/14/22 0312 03/15/22 0330 03/16/22 0451  NA 139 140 140 141 141  K 4.7 4.3 4.4 3.9 4.8  CL 106 111 111 109 110  CO2 24 24 24 25 24   GLUCOSE 80 58* 135* 105* 100*  BUN 33* 27* 26* 21 26*  CREATININE 2.10* 1.66* 1.49* 1.39* 1.45*  CALCIUM 9.3 8.7* 8.9 9.3 9.0  MG  --   --  2.0 2.1 2.1    Liver Function Tests: Recent Labs  Lab 03/12/22 2158 03/14/22 0312  AST 19 18  ALT 14 15  ALKPHOS 72 88  BILITOT  1.0 0.4  PROT 6.0* 5.1*  ALBUMIN 3.2* 2.8*     Radiology Studies: No results found.    LOS: 4 days    Darlin Drop, MD [facility] Available via Epic secure chat 7am-7pm After these hours, please refer to coverage provider listed on [email or web address] 03/17/2022, 4:44 PM

## 2022-03-17 NOTE — Progress Notes (Signed)
Pt removed her tele monitor and will not let this RN or staff to put it back on.  Informed pt of risks of not wearing heart monitor.  Informed Dr. Margo Aye.  Erick Blinks, RN

## 2022-03-17 NOTE — Progress Notes (Addendum)
Updated the patient's daughter at bedside this afternoon.  She requested that CT chest be done in the hospital to assess pulmonary nodule.  Follow up results.  The patient has declined to wear her heart monitor multiple times and becomes agitated when it is applied.  Holding off monitor for now.  PRN 12 lead EKG ordered.

## 2022-03-17 NOTE — Consult Note (Signed)
   [facility] CM Inpatient Consult   03/17/2022  Nancy Suarez Nov 30, 1931 [id number]  [facility] [[facility]]  Accountable Care Organization [ACO] Patient: Nancy Suarez [facility] Medicare  Primary Care Provider:  Corine Shelter, MD  Patient screened for less than 30 days readmission hospitalization with noted high risk score for unplanned readmission risk. Review of patient's medical record in encounters reveals patient has had out reach attempts by a [facility] RN without success noted. Patient's electronic medical record reveals disposition is being recommended for a skilled facility level of care when patient is medically ready.  Plan:  Currently, there are no [facility] Care Management needs noted for community to follow, at this time.    For questions contact:   Charlesetta Shanks, RN BSN CCM [facility]  [phone] business mobile phone Toll free office [phone]  Fax number: [phone] [email or web address]

## 2022-03-17 NOTE — Progress Notes (Signed)
Patient refused EKG, refused medications.

## 2022-03-17 NOTE — TOC Progression Note (Signed)
Transition of Care [facility]) - Progression Note    Patient Details  Name: Nancy Suarez MRN: [id number] Date of Birth: 1932/01/28  Transition of Care [facility]) CM/SW [place], [place] Phone Number: 03/17/2022, 3:00 PM  Clinical Narrative:   CSW discussed patient case with psychiatry and plan for SNF placement. CSW discussed barrier of restraints and agitation, and psychiatry is making medication adjustments to hopefully keep patient calm in the evenings when she struggles the most. CSW met with patient's daughter, [name], at bedside to discuss that patient only had one bed offer at this time, likely due to agitation. [name] indicated understanding. CSW discussed plan of modifying patient's medications and hoping for an improvement in behavior, will try to send out referrals and advocate more for patient once she's had a couple days without agitation. [name] in agreement. [name] hopeful for either [facility] or [facility] for SNF when patient is stable. CSW to follow.    Expected Discharge Plan: [place] Barriers to Discharge: Continued Medical Work up, Ship broker, Requiring sitter/restraints  Expected Discharge Plan and Services Expected Discharge Plan: [place] arrangements for the past 2 months: Single Family Home                                       Social Determinants of Health (SDOH) Interventions    Readmission Risk Interventions    12/01/2021    1:14 PM  Readmission Risk Prevention Plan  Transportation Screening Complete  PCP or Specialist Appt within 3-5 Days Complete  HRI or [place] Complete  Social Work Consult for [place] Planning/Counseling Complete  Palliative Care Screening Complete  Medication Review Press photographer) Complete

## 2022-03-17 NOTE — Progress Notes (Signed)
Mobility Specialist: Progress Note   03/17/22 1228  Mobility  Activity Ambulated with assistance in hallway  Level of Assistance Minimal assist, patient does 75% or more  Assistive Device Front wheel walker  Distance Ambulated (ft) 150 ft  Activity Response Tolerated well  $Mobility charge 1 Mobility   Pt received in the chair and agreeable to mobility. C/o BLE fatigue/pain during ambulation, no rating given. Pt back to the chair after session with NT present in the room.   [facility] Renae Mottley Mobility Specialist Mobility Specialist [address] [place]: [phone]

## 2022-03-17 NOTE — Care Plan (Signed)
Refusing CT @ 2033

## 2022-03-18 ENCOUNTER — Inpatient Hospital Stay (HOSPITAL_COMMUNITY): Payer: Medicare Other

## 2022-03-18 DIAGNOSIS — D509 Iron deficiency anemia, unspecified: Secondary | ICD-10-CM | POA: Diagnosis not present

## 2022-03-18 DIAGNOSIS — R296 Repeated falls: Secondary | ICD-10-CM | POA: Diagnosis not present

## 2022-03-18 DIAGNOSIS — G9340 Encephalopathy, unspecified: Secondary | ICD-10-CM | POA: Diagnosis not present

## 2022-03-18 DIAGNOSIS — N179 Acute kidney failure, unspecified: Secondary | ICD-10-CM | POA: Diagnosis not present

## 2022-03-18 LAB — CBC
HCT: 31.5 % — ABNORMAL LOW (ref 36.0–46.0)
Hemoglobin: 10.2 g/dL — ABNORMAL LOW (ref 12.0–15.0)
MCH: 30.1 pg (ref 26.0–34.0)
MCHC: 32.4 g/dL (ref 30.0–36.0)
MCV: 92.9 fL (ref 80.0–100.0)
Platelets: 192 10*3/uL (ref 150–400)
RBC: 3.39 MIL/uL — ABNORMAL LOW (ref 3.87–5.11)
RDW: 15.4 % (ref 11.5–15.5)
WBC: 4.8 10*3/uL (ref 4.0–10.5)
nRBC: 0 % (ref 0.0–0.2)

## 2022-03-18 LAB — COMPREHENSIVE METABOLIC PANEL
ALT: 14 U/L (ref 0–44)
AST: 19 U/L (ref 15–41)
Albumin: 3.1 g/dL — ABNORMAL LOW (ref 3.5–5.0)
Alkaline Phosphatase: 74 U/L (ref 38–126)
Anion gap: 9 (ref 5–15)
BUN: 26 mg/dL — ABNORMAL HIGH (ref 8–23)
CO2: 24 mmol/L (ref 22–32)
Calcium: 9.4 mg/dL (ref 8.9–10.3)
Chloride: 108 mmol/L (ref 98–111)
Creatinine, Ser: 1.52 mg/dL — ABNORMAL HIGH (ref 0.44–1.00)
GFR, Estimated: 33 mL/min — ABNORMAL LOW (ref >60)
Glucose, Bld: 128 mg/dL — ABNORMAL HIGH (ref 70–99)
Potassium: 4.2 mmol/L (ref 3.5–5.1)
Sodium: 141 mmol/L (ref 135–145)
Total Bilirubin: 0.7 mg/dL (ref 0.3–1.2)
Total Protein: 5.6 g/dL — ABNORMAL LOW (ref 6.5–8.1)

## 2022-03-18 LAB — PHOSPHORUS: Phosphorus: 3 mg/dL (ref 2.5–4.6)

## 2022-03-18 LAB — MAGNESIUM: Magnesium: 2.1 mg/dL (ref 1.7–2.4)

## 2022-03-18 MED ORDER — GUAIFENESIN-DM 100-10 MG/5ML PO SYRP
5.0000 mL | ORAL_SOLUTION | ORAL | Status: DC | PRN
  Administered 2022-03-18: 5 mL via ORAL
  Filled 2022-03-18: qty 5

## 2022-03-18 NOTE — Progress Notes (Signed)
Retime daily labs for 8 am, to promote rest and comfort.

## 2022-03-18 NOTE — Progress Notes (Signed)
PROGRESS NOTE    Nancy Suarez  YSA:[id number] DOB: 1931/11/30 DOA: 03/12/2022 PCP: Vincente Liberty, MD   Brief Narrative: Nancy Suarez is a 88 y.o. female with a history of subarachnoid hemorrhage, anemia, hyperlipidemia, depression, GERD, CKD stage IIIa. Patient presented secondary to falls and was found to have evidence of an acute subdural hematoma in addition to AMS and AKI. Subdural hematoma managed conservatively per neurosurgery recommendations. AKI managed IV fluids. Medications adjusted to improve mental status change. Admission complicated by suicidal ideations for which psychiatry was consulted with recommendations for no inpatient behavioral health admission.  Assessment and Plan:  Recurrent falls Possibly related to medication effect vs generalized weakness. PT/OT recommending SNF on discharge.  Acute metabolic encephalopathy Presumed multifactorial and secondary to AKI, possibly underlying dementia, medication effect. Encephalopathy improved.  AKI on CKD stage IIIa Baseline creatinine appears to be around 1.4 - 1.5. Peak creatinine of 2.10 which was on admission with improvement on IV fluids.  Subdural hematoma Secondary to fall and head injury. CT head (9/10) on admission noted evidence of small amount of subdural blood along posterior falx. Repeat CT head (9/11) unchanged. Neurosurgery, Dr. Zada Finders, recommended no surgical intervention and no neurosurgical follow-up.  Suicidal ideation Psychiatry consulted and recommended no inpatient admission.  Hyperlipidemia -Continue Lipitor  GERD -Continue Pepcid  Chronic anemia Both iron deficiency and chronic disease documented as etiology for chronic anemia. -Anemia panel  Allergic rhinitis Noted. Minimize antihistamines if able secondary to patient's age.  DVT prophylaxis: SCDs Code Status:   Code Status: Full Code Family Communication: None at bedside Disposition Plan: Discharge to SNF once a bed is  available   Consultants:  Psychiatry Neurosurgery  Procedures:  None  Antimicrobials: None    Subjective: Patient reports no concerns for me this morning.  Objective: BP (!) 128/52 (BP Location: Right Arm)   Pulse 77   Temp 98.1 F (36.7 C) (Axillary)   Resp 18   Ht 5\' 5"  (1.651 m)   Wt 60.5 kg   SpO2 100%   BMI 22.20 kg/m   Examination:  General exam: Appears calm and comfortable Respiratory system: Clear to auscultation. Respiratory effort normal. Cardiovascular system: S1 & S2 heard, RRR. Gastrointestinal system: Abdomen is nondistended, soft and nontender. No organomegaly or masses felt. Normal bowel sounds heard. Central nervous system: Alert and oriented to person and place. No focal neurological deficits. Musculoskeletal: No edema. No calf tenderness Skin: No cyanosis. No rashes   Data Reviewed: I have personally reviewed following labs and imaging studies  CBC Lab Results  Component Value Date   WBC 4.8 03/18/2022   RBC 3.39 (L) 03/18/2022   HGB 10.2 (L) 03/18/2022   HCT 31.5 (L) 03/18/2022   MCV 92.9 03/18/2022   MCH 30.1 03/18/2022   PLT 192 03/18/2022   MCHC 32.4 03/18/2022   RDW 15.4 03/18/2022   LYMPHSABS 0.8 03/14/2022   MONOABS 0.6 03/14/2022   EOSABS 0.1 03/14/2022   BASOSABS 0.0 32/35/5732     Last metabolic panel Lab Results  Component Value Date   NA 141 03/18/2022   K 4.2 03/18/2022   CL 108 03/18/2022   CO2 24 03/18/2022   BUN 26 (H) 03/18/2022   CREATININE 1.52 (H) 03/18/2022   GLUCOSE 128 (H) 03/18/2022   GFRNONAA 33 (L) 03/18/2022   GFRAA 41 (L) 06/11/2017   CALCIUM 9.4 03/18/2022   PHOS 3.0 03/18/2022   PROT 5.6 (L) 03/18/2022   ALBUMIN 3.1 (L) 03/18/2022   BILITOT 0.7 03/18/2022  ALKPHOS 74 03/18/2022   AST 19 03/18/2022   ALT 14 03/18/2022   ANIONGAP 9 03/18/2022    GFR: Estimated Creatinine Clearance: 23 mL/min (A) (by C-G formula based on SCr of 1.52 mg/dL (H)).  No results found for this or any  previous visit (from the past 240 hour(s)).    Radiology Studies: CT CHEST WO CONTRAST  Result Date: 03/18/2022 CLINICAL DATA:  Lung nodule follow-up. EXAM: CT CHEST WITHOUT CONTRAST TECHNIQUE: Multidetector CT imaging of the chest was performed following the standard protocol without IV contrast. RADIATION DOSE REDUCTION: This exam was performed according to the departmental dose-optimization program which includes automated exposure control, adjustment of the mA and/or kV according to patient size and/or use of iterative reconstruction technique. COMPARISON:  Chest radiograph, 03/14/2022. FINDINGS: Cardiovascular: Heart is normal in size. Three-vessel coronary artery calcifications. No pericardial effusion. Aorta normal in caliber. Mild aortic atherosclerotic calcifications. Mediastinum/Nodes: Thyroid gland is enlarged with poorly defined margins, extending to the level of the medial clavicle heads. No defined nodule, but gland is heterogeneous. No mediastinal or hilar masses or enlarged lymph nodes. Trachea and esophagus are unremarkable. Lungs/Pleura: Right upper lobe pulmonary nodule, 2.1 x 1.6 x 1.6 cm. This corresponds to the nodule noted on the recent chest radiograph. No other lung nodules. There is opacity in the lung bases, mostly in the lower lobes, right greater than left, consistent with atelectasis. No convincing pneumonia. No pulmonary edema. No pleural effusion or pneumothorax. Upper Abdomen: No acute findings.  Aortic atherosclerosis. Musculoskeletal: No fracture or acute finding. No osteoblastic or osteolytic lesions. Advanced arthropathic changes of the glenohumeral joints. IMPRESSION: 1. Right upper lobe nodule, mean size 1.9 cm. Consider one of the following in 3 months for both low-risk and high-risk individuals: (a) repeat chest CT, (b) follow-up PET-CT, or (c) tissue sampling. This recommendation follows the consensus statement: Guidelines for Management of Incidental Pulmonary Nodules  Detected on CT Images: From the Fleischner Society 2017; Radiology 2017; 284:228-243. 2. No other lung nodules. 3. Dependent lung opacities, right greater than left, consistent with atelectasis. No convincing pneumonia. No pulmonary edema. 4. Enlarged heterogeneous thyroid. Recommend thyroid ultrasound (ref: J Am Coll Radiol. 2015 Feb;12(2): 143-50). 5. Aortic atherosclerosis. Aortic Atherosclerosis (ICD10-I70.0). Electronically Signed   By: Lajean Manes M.D.   On: 03/18/2022 11:33      LOS: 5 days    Cordelia Poche, MD [facility] 03/18/2022, 1:39 PM   If 7PM-7AM, please contact night-coverage [email or web address]

## 2022-03-19 DIAGNOSIS — N179 Acute kidney failure, unspecified: Secondary | ICD-10-CM | POA: Diagnosis not present

## 2022-03-19 DIAGNOSIS — R296 Repeated falls: Secondary | ICD-10-CM | POA: Diagnosis not present

## 2022-03-19 DIAGNOSIS — G9340 Encephalopathy, unspecified: Secondary | ICD-10-CM | POA: Diagnosis not present

## 2022-03-19 DIAGNOSIS — D509 Iron deficiency anemia, unspecified: Secondary | ICD-10-CM | POA: Diagnosis not present

## 2022-03-19 LAB — RETICULOCYTES
Immature Retic Fract: 9.9 % (ref 2.3–15.9)
RBC.: 3.35 MIL/uL — ABNORMAL LOW (ref 3.87–5.11)
Retic Count, Absolute: 66 10*3/uL (ref 19.0–186.0)
Retic Ct Pct: 2 % (ref 0.4–3.1)

## 2022-03-19 LAB — IRON AND TIBC
Iron: 63 ug/dL (ref 28–170)
Saturation Ratios: 19 % (ref 10.4–31.8)
TIBC: 335 ug/dL (ref 250–450)
UIBC: 272 ug/dL

## 2022-03-19 LAB — VITAMIN B12: Vitamin B-12: 757 pg/mL (ref 180–914)

## 2022-03-19 LAB — FERRITIN: Ferritin: 30 ng/mL (ref 11–307)

## 2022-03-19 LAB — FOLATE: Folate: 10.7 ng/mL (ref >5.9)

## 2022-03-19 NOTE — Progress Notes (Signed)
Mobility Specialist Progress Note:   03/19/22 1523  Mobility  Activity Refused mobility   Pt agitated and refusing mobility. Will f/u as able.    Daymen Hassebrock Mobility Specialist-Acute Rehab Secure Chat only

## 2022-03-19 NOTE — Progress Notes (Signed)
PROGRESS NOTE    [name]  PJA:[id number] DOB: [birth year] DOA: 03/12/2022 PCP: Vincente Liberty, MD   Brief Narrative: Nancy Suarez is a 88 y.o. female with a history of subarachnoid hemorrhage, anemia, hyperlipidemia, depression, GERD, CKD stage IIIa. Patient presented secondary to falls and was found to have evidence of an acute subdural hematoma in addition to AMS and AKI. Subdural hematoma managed conservatively per neurosurgery recommendations. AKI managed IV fluids. Medications adjusted to improve mental status change. Admission complicated by suicidal ideations for which psychiatry was consulted with recommendations for no inpatient behavioral health admission.  Assessment and Plan:  Recurrent falls Possibly related to medication effect vs generalized weakness. PT/OT recommending SNF on discharge.  Acute metabolic encephalopathy Presumed multifactorial and secondary to AKI, possibly underlying dementia, medication effect. Encephalopathy improved.  AKI on CKD stage IIIa Baseline creatinine appears to be around 1.4 - 1.5. Peak creatinine of 2.10 which was on admission with improvement on IV fluids.  Subdural hematoma Secondary to fall and head injury. CT head (9/10) on admission noted evidence of small amount of subdural blood along posterior falx. Repeat CT head (9/11) unchanged. Neurosurgery, Dr. Zada Finders, recommended no surgical intervention and no neurosurgical follow-up.  Suicidal ideation Psychiatry consulted and recommended no inpatient admission.  Hyperlipidemia -Continue Lipitor  GERD -Continue Pepcid  Chronic anemia Both iron deficiency and chronic disease documented as etiology for chronic anemia. Anemia panel is significant for low-normal ferritin. Would favor more of an iron deficiency picture but iron levels stable.  Allergic rhinitis Noted. Minimize antihistamines if able secondary to patient's age.   DVT prophylaxis: SCDs Code Status:   Code  Status: Full Code Family Communication: None at bedside. Called daughter but no answer. Disposition Plan: Discharge to SNF once a bed is available   Consultants:  Psychiatry Neurosurgery  Procedures:  None  Antimicrobials: None    Subjective: Patient reports no concerns for me this morning.  Objective: BP (!) 135/52 (BP Location: Right Arm)   Pulse 83   Temp 98.8 F (37.1 C) (Axillary)   Resp 16   Ht 5\' 5"  (1.651 m)   Wt 60.5 kg   SpO2 97%   BMI 22.20 kg/m   Examination:  General exam: Appears calm and comfortable Respiratory system: Clear to auscultation. Respiratory effort normal. Cardiovascular system: S1 & S2 heard, RRR. Gastrointestinal system: Abdomen is nondistended, soft and nontender. Normal bowel sounds heard. Central nervous system: Alert to person and place. No focal neurological deficits. Musculoskeletal: No edema. No calf tenderness Skin: No cyanosis. No rashes   Data Reviewed: I have personally reviewed following labs and imaging studies  CBC Lab Results  Component Value Date   WBC 4.8 03/18/2022   RBC 3.35 (L) 03/19/2022   HGB 10.2 (L) 03/18/2022   HCT 31.5 (L) 03/18/2022   MCV 92.9 03/18/2022   MCH 30.1 03/18/2022   PLT 192 03/18/2022   MCHC 32.4 03/18/2022   RDW 15.4 03/18/2022   LYMPHSABS 0.8 03/14/2022   MONOABS 0.6 03/14/2022   EOSABS 0.1 03/14/2022   BASOSABS 0.0 34/19/3790     Last metabolic panel Lab Results  Component Value Date   NA 141 03/18/2022   K 4.2 03/18/2022   CL 108 03/18/2022   CO2 24 03/18/2022   BUN 26 (H) 03/18/2022   CREATININE 1.52 (H) 03/18/2022   GLUCOSE 128 (H) 03/18/2022   GFRNONAA 33 (L) 03/18/2022   GFRAA 41 (L) 06/11/2017   CALCIUM 9.4 03/18/2022   PHOS 3.0 03/18/2022  PROT 5.6 (L) 03/18/2022   ALBUMIN 3.1 (L) 03/18/2022   BILITOT 0.7 03/18/2022   ALKPHOS 74 03/18/2022   AST 19 03/18/2022   ALT 14 03/18/2022   ANIONGAP 9 03/18/2022    GFR: Estimated Creatinine Clearance: 23 mL/min  (A) (by C-G formula based on SCr of 1.52 mg/dL (H)).  No results found for this or any previous visit (from the past 240 hour(s)).    Radiology Studies: CT CHEST WO CONTRAST  Result Date: 03/18/2022 CLINICAL DATA:  Lung nodule follow-up. EXAM: CT CHEST WITHOUT CONTRAST TECHNIQUE: Multidetector CT imaging of the chest was performed following the standard protocol without IV contrast. RADIATION DOSE REDUCTION: This exam was performed according to the departmental dose-optimization program which includes automated exposure control, adjustment of the mA and/or kV according to patient size and/or use of iterative reconstruction technique. COMPARISON:  Chest radiograph, 03/14/2022. FINDINGS: Cardiovascular: Heart is normal in size. Three-vessel coronary artery calcifications. No pericardial effusion. Aorta normal in caliber. Mild aortic atherosclerotic calcifications. Mediastinum/Nodes: Thyroid gland is enlarged with poorly defined margins, extending to the level of the medial clavicle heads. No defined nodule, but gland is heterogeneous. No mediastinal or hilar masses or enlarged lymph nodes. Trachea and esophagus are unremarkable. Lungs/Pleura: Right upper lobe pulmonary nodule, 2.1 x 1.6 x 1.6 cm. This corresponds to the nodule noted on the recent chest radiograph. No other lung nodules. There is opacity in the lung bases, mostly in the lower lobes, right greater than left, consistent with atelectasis. No convincing pneumonia. No pulmonary edema. No pleural effusion or pneumothorax. Upper Abdomen: No acute findings.  Aortic atherosclerosis. Musculoskeletal: No fracture or acute finding. No osteoblastic or osteolytic lesions. Advanced arthropathic changes of the glenohumeral joints. IMPRESSION: 1. Right upper lobe nodule, mean size 1.9 cm. Consider one of the following in 3 months for both low-risk and high-risk individuals: (a) repeat chest CT, (b) follow-up PET-CT, or (c) tissue sampling. This recommendation  follows the consensus statement: Guidelines for Management of Incidental Pulmonary Nodules Detected on CT Images: From the Fleischner Society 2017; Radiology 2017; 284:228-243. 2. No other lung nodules. 3. Dependent lung opacities, right greater than left, consistent with atelectasis. No convincing pneumonia. No pulmonary edema. 4. Enlarged heterogeneous thyroid. Recommend thyroid ultrasound (ref: J Am Coll Radiol. 2015 Feb;12(2): 143-50). 5. Aortic atherosclerosis. Aortic Atherosclerosis (ICD10-I70.0). Electronically Signed   By: Lajean Manes M.D.   On: 03/18/2022 11:33      LOS: 6 days    Cordelia Poche, MD [facility] 03/19/2022, 2:50 PM   If 7PM-7AM, please contact night-coverage [email or web address]

## 2022-03-20 DIAGNOSIS — D509 Iron deficiency anemia, unspecified: Secondary | ICD-10-CM | POA: Diagnosis not present

## 2022-03-20 DIAGNOSIS — R296 Repeated falls: Secondary | ICD-10-CM | POA: Diagnosis not present

## 2022-03-20 DIAGNOSIS — N179 Acute kidney failure, unspecified: Secondary | ICD-10-CM | POA: Diagnosis not present

## 2022-03-20 DIAGNOSIS — G9340 Encephalopathy, unspecified: Secondary | ICD-10-CM | POA: Diagnosis not present

## 2022-03-20 NOTE — Progress Notes (Signed)
Mobility Specialist: Progress Note   03/20/22 1102  Mobility  Activity Ambulated with assistance in hallway  Level of Assistance Contact guard assist, steadying assist  Assistive Device Front wheel walker  Distance Ambulated (ft) 60 ft  Activity Response Tolerated well  $Mobility charge 1 Mobility   Received pt in chair having no complaints and agreeable to mobility. Pt was asymptomatic throughout ambulation and returned to room w/o fault. Left in chair w/ call bell in reach and all needs met.  [facility] Januel Doolan Mobility Specialist Mobility Specialist [address]: [phone]

## 2022-03-20 NOTE — Care Management Important Message (Signed)
Important Message  Patient Details  Name: Nancy Suarez MRN: [id number] Date of Birth: 1931/09/18   Medicare Important Message Given:  Yes     Hannah Beat 03/20/2022, 2:27 PM

## 2022-03-20 NOTE — Progress Notes (Signed)
PROGRESS NOTE    Nancy Suarez  WUJ:[id number] DOB: 04/23/1932 DOA: 03/12/2022 PCP: Vincente Liberty, MD   Brief Narrative: Nancy Suarez is a 88 y.o. female with a history of subarachnoid hemorrhage, anemia, hyperlipidemia, depression, GERD, CKD stage IIIa. Patient presented secondary to falls and was found to have evidence of an acute subdural hematoma in addition to AMS and AKI. Subdural hematoma managed conservatively per neurosurgery recommendations. AKI managed IV fluids. Medications adjusted to improve mental status change. Admission complicated by suicidal ideations for which psychiatry was consulted with recommendations for no inpatient behavioral health admission.  Assessment and Plan:  Recurrent falls Possibly related to medication effect vs generalized weakness. PT/OT recommending SNF on discharge.  Acute metabolic encephalopathy Presumed multifactorial and secondary to AKI, possibly underlying dementia, medication effect. Encephalopathy resolved. Patient is very pleasant today. No requirement for sitters overnight. Patient would benefit from neuropsychiatry evaluation as an outpatient.  AKI on CKD stage IIIa Baseline creatinine appears to be around 1.4 - 1.5. Peak creatinine of 2.10 which was on admission with improvement on IV fluids.  Subdural hematoma Secondary to fall and head injury. CT head (9/10) on admission noted evidence of small amount of subdural blood along posterior falx. Repeat CT head (9/11) unchanged. Neurosurgery, Dr. Zada Finders, recommended no surgical intervention and no neurosurgical follow-up.  Suicidal ideation Psychiatry consulted and recommended no inpatient admission.  Hyperlipidemia -Continue Lipitor  GERD -Continue Pepcid  Chronic anemia Both iron deficiency and chronic disease documented as etiology for chronic anemia. Anemia panel is significant for low-normal ferritin. Would favor more of an iron deficiency picture but iron levels  stable.  Allergic rhinitis Noted. Minimize antihistamines if able secondary to patient's age.  Lung nodule Right upper lobe, measuring 1.9 cm with recommendations for 3 month follow-up with repeat chest CT vs PET-CT vs tissue sampling.   DVT prophylaxis: SCDs Code Status:   Code Status: Full Code Family Communication: None at bedside. Called daughter but no answer (9/18) Disposition Plan: Discharge to SNF once a bed is available   Consultants:  Psychiatry Neurosurgery  Procedures:  None  Antimicrobials: None    Subjective: No issues per patient. Feels well. Offered me some of her breakfast this morning.  Objective: BP (!) 143/54 (BP Location: Right Arm)   Pulse 86   Temp 98.6 F (37 C) (Oral)   Resp 18   Ht 5\' 5"  (1.651 m)   Wt 60.9 kg   SpO2 97%   BMI 22.34 kg/m   Examination:  General exam: Appears calm and comfortable. Up in the chair eating breakfast. Respiratory system: Clear to auscultation. Respiratory effort normal. Cardiovascular system: S1 & S2 heard, RRR. No murmurs, rubs, gallops or clicks. Gastrointestinal system: Abdomen is nondistended, soft and nontender. Normal bowel sounds heard. Central nervous system: Alert and oriented to person and place. Musculoskeletal: No edema. No calf tenderness Skin: No cyanosis. No rashes Psychiatry: Euthymic affect.    Data Reviewed: I have personally reviewed following labs and imaging studies  CBC Lab Results  Component Value Date   WBC 4.8 03/18/2022   RBC 3.35 (L) 03/19/2022   HGB 10.2 (L) 03/18/2022   HCT 31.5 (L) 03/18/2022   MCV 92.9 03/18/2022   MCH 30.1 03/18/2022   PLT 192 03/18/2022   MCHC 32.4 03/18/2022   RDW 15.4 03/18/2022   LYMPHSABS 0.8 03/14/2022   MONOABS 0.6 03/14/2022   EOSABS 0.1 03/14/2022   BASOSABS 0.0 95/62/1308     Last metabolic panel Lab Results  Component Value Date   NA 141 03/18/2022   K 4.2 03/18/2022   CL 108 03/18/2022   CO2 24 03/18/2022   BUN 26 (H)  03/18/2022   CREATININE 1.52 (H) 03/18/2022   GLUCOSE 128 (H) 03/18/2022   GFRNONAA 33 (L) 03/18/2022   GFRAA 41 (L) 06/11/2017   CALCIUM 9.4 03/18/2022   PHOS 3.0 03/18/2022   PROT 5.6 (L) 03/18/2022   ALBUMIN 3.1 (L) 03/18/2022   BILITOT 0.7 03/18/2022   ALKPHOS 74 03/18/2022   AST 19 03/18/2022   ALT 14 03/18/2022   ANIONGAP 9 03/18/2022    GFR: Estimated Creatinine Clearance: 23 mL/min (A) (by C-G formula based on SCr of 1.52 mg/dL (H)).  No results found for this or any previous visit (from the past 240 hour(s)).    Radiology Studies: No results found.    LOS: 7 days    Cordelia Poche, MD [facility] 03/20/2022, 12:23 PM   If 7PM-7AM, please contact night-coverage [email or web address]

## 2022-03-20 NOTE — Progress Notes (Signed)
Mobility Specialist: Progress Note   03/20/22 1708  Mobility  Activity Ambulated with assistance in hallway  Level of Assistance Minimal assist, patient does 75% or more  Assistive Device Front wheel walker  Distance Ambulated (ft) 110 ft  Activity Response Tolerated well  $Mobility charge 1 Mobility   Received pt in chair having no complaints and agreeable to mobility. MinA to stand from the chair. Pt was asymptomatic throughout ambulation and returned to room w/o fault. Left in chair w/ call bell in reach and all needs met.  [facility] Georgeanne Frankland Mobility Specialist Mobility Specialist [address]: [phone]

## 2022-03-20 NOTE — Consult Note (Signed)
Brief Psychiatry Consult Note  Date of service: March 20, 2022 Patient Name: Nancy Suarez DOB: 1931-12-06 MRN: [id number] Reason for Consult: "Depression and SI with plan" Referring Provider: Babs Bertin, DO  Nancy Suarez is a 88 y.o. female admitted medically for 03/12/2022  2:08 PM for frequent falls from ground-level with recent admission 8/29 with subarachnoid hemorrhage after ground-level fall without loss of consciousness. She carries the psychiatric diagnosis of depression and has a past medical history of chronic IDA, HLD, and CKD stage IIIa.  On assessment today, patient is very pleasant, stating that she is well rested and she did not have problems with "the couple" whom she previously hallucinated while sundowning.  She denies SI and HI.  She states that she feels "much better" today, and is in a better overall mood.  Patient seen after eating her breakfast, which she ate 100%.  Patient cannot remember her last bowel movement, (which is documented last night).  She apologizes profusely for the things that she is said and her behaviors toward staff.  She denies adverse effects of medications.  We discussed continuing her medications as prescribed, she is agreeable.  Interim documentation by primary team and nursing staff has been reviewed. At this time, patient is not a safety concern to herself nor others 2/2 SI or HI (although physical safety is a concern), and there is no evidence of acute psychiatric disturbance requiring ongoing psychiatric consultation. Please see last consult note for full assessment.   Final medication recommendations are as follows:  -Continue Depakote sprinkles 125 mg twice daily - Continue Lexapro 10 mg daily - Continue Risperdal 0.5 mg nightly as needed agitation  We will sign off at this time. This has been communicated to the primary team. If issues arise in the future, don't hesitate to reconsult the Psychiatry Inpatient Consult Service.    Signed: Rosezetta Schlatter, MD Psychiatry Resident, PGY-2 [place] 03/20/2022, 12:16 PM

## 2022-03-20 NOTE — Plan of Care (Signed)

## 2022-03-20 NOTE — Progress Notes (Signed)
Physical Therapy Treatment Patient Details Name: Nancy Suarez MRN: [id number] DOB: 1931/10/26 Today's Date: 03/20/2022   History of Present Illness Pt is a 88 y/o female presenting on 9/10 after fall. CT 9/10 with small subdural along posterior falx, follow up CT 9/11 with unchanged subdural and subacute L lateral frontal cortical infarct. Noted admission 8/29-8/30 with subarachnoid hemorrhage after fall. PMH includes: HTN, scoliosis, R intertrochanteric IM nail 2023, ORIF femur 2018, L TKA 2015, dementia.    PT Comments    Pt sleeping on arrival but easy to rouse. Pt reporting need to use the bathroom. Pt assisted to/from bathroom prior to hallway ambulation. She required mod assist bed mobility, mod assist sit to stand, and min assist ambulation 60' with RW. Pt in recliner with feet elevated at end of session.    Recommendations for follow up therapy are one component of a multi-disciplinary discharge planning process, led by the attending physician.  Recommendations may be updated based on patient status, additional functional criteria and insurance authorization.  Follow Up Recommendations  Skilled nursing-short term rehab (<3 hours/day) Can patient physically be transported by private vehicle: Yes   Assistance Recommended at Discharge Frequent or constant Supervision/Assistance  Patient can return home with the following A little help with walking and/or transfers;A little help with bathing/dressing/bathroom;Assistance with cooking/housework;Assist for transportation;Help with stairs or ramp for entrance   Equipment Recommendations  None recommended by PT    Recommendations for Other Services       Precautions / Restrictions Precautions Precautions: Fall     Mobility  Bed Mobility Overal bed mobility: Needs Assistance Bed Mobility: Supine to Sit     Supine to sit: Mod assist, HOB elevated     General bed mobility comments: increased time, assist with BLE and trunk     Transfers   Equipment used: Rolling walker (2 wheels) Transfers: Sit to/from Stand Sit to Stand: Mod assist           General transfer comment: assist to power up and stabilize balance, increased time, cues for hand placement and sequencing    Ambulation/Gait Ambulation/Gait assistance: Min assist Gait Distance (Feet): 60 Feet Assistive device: Rolling walker (2 wheels) Gait Pattern/deviations: Step-through pattern, Decreased stride length, Trunk flexed, Narrow base of support Gait velocity: Decreased Gait velocity interpretation: <1.31 ft/sec, indicative of household ambulator   General Gait Details: ER RLE with toes pointed laterally. Slow, guarded gait. Continual cues for sequencing. Assist to maintain balance   Stairs             Wheelchair Mobility    Modified Rankin (Stroke Patients Only) Modified Rankin (Stroke Patients Only) Pre-Morbid Rankin Score: Moderately severe disability Modified Rankin: Moderately severe disability     Balance Overall balance assessment: Needs assistance Sitting-balance support: No upper extremity supported, Feet supported Sitting balance-Leahy Scale: Fair     Standing balance support: Bilateral upper extremity supported, During functional activity, Reliant on assistive device for balance Standing balance-Leahy Scale: Poor                              Cognition Arousal/Alertness: Awake/alert Behavior During Therapy: WFL for tasks assessed/performed Overall Cognitive Status: History of cognitive impairments - at baseline                                 General Comments: Difficulty sequencing and staying on task. Increased fear  of falling. Stating several times "I'm falling, I'm falling" but easily able to calm with reassurance and verbal cues. Decreased problem solving.        Exercises      General Comments        Pertinent Vitals/Pain Pain Assessment Pain Assessment: Faces Faces Pain  Scale: No hurt    Home Living                          Prior Function            PT Goals (current goals can now be found in the care plan section) Acute Rehab PT Goals Patient Stated Goal: not stated Progress towards PT goals: Progressing toward goals    Frequency    Min 2X/week      PT Plan Current plan remains appropriate    Co-evaluation              AM-PAC PT "6 Clicks" Mobility   Outcome Measure  Help needed turning from your back to your side while in a flat bed without using bedrails?: A Little Help needed moving from lying on your back to sitting on the side of a flat bed without using bedrails?: A Lot Help needed moving to and from a bed to a chair (including a wheelchair)?: A Little Help needed standing up from a chair using your arms (e.g., wheelchair or bedside chair)?: A Lot Help needed to walk in hospital room?: A Little Help needed climbing 3-5 steps with a railing? : A Lot 6 Click Score: 15    End of Session Equipment Utilized During Treatment: Gait belt Activity Tolerance: Patient tolerated treatment well Patient left: in chair;with call bell/phone within reach;with chair alarm set Nurse Communication: Mobility status PT Visit Diagnosis: Unsteadiness on feet (R26.81);Other symptoms and signs involving the nervous system [id number])     Time: [id number] PT Time Calculation (min) (ACUTE ONLY): 24 min  Charges:  $Gait Training: 23-37 mins                     Lorrin Goodell, [place]  Office # [phone] Pager [phone]    Lorriane Shire 03/20/2022, 8:55 AM

## 2022-03-21 DIAGNOSIS — D509 Iron deficiency anemia, unspecified: Secondary | ICD-10-CM | POA: Diagnosis not present

## 2022-03-21 DIAGNOSIS — G9340 Encephalopathy, unspecified: Secondary | ICD-10-CM | POA: Diagnosis not present

## 2022-03-21 DIAGNOSIS — N179 Acute kidney failure, unspecified: Secondary | ICD-10-CM | POA: Diagnosis not present

## 2022-03-21 DIAGNOSIS — R296 Repeated falls: Secondary | ICD-10-CM | POA: Diagnosis not present

## 2022-03-21 LAB — BASIC METABOLIC PANEL
Anion gap: 10 (ref 5–15)
BUN: 32 mg/dL — ABNORMAL HIGH (ref 8–23)
CO2: 26 mmol/L (ref 22–32)
Calcium: 9.8 mg/dL (ref 8.9–10.3)
Chloride: 106 mmol/L (ref 98–111)
Creatinine, Ser: 1.59 mg/dL — ABNORMAL HIGH (ref 0.44–1.00)
GFR, Estimated: 31 mL/min — ABNORMAL LOW (ref >60)
Glucose, Bld: 106 mg/dL — ABNORMAL HIGH (ref 70–99)
Potassium: 4.6 mmol/L (ref 3.5–5.1)
Sodium: 142 mmol/L (ref 135–145)

## 2022-03-21 LAB — CBC
HCT: 33.2 % — ABNORMAL LOW (ref 36.0–46.0)
Hemoglobin: 10.6 g/dL — ABNORMAL LOW (ref 12.0–15.0)
MCH: 29.6 pg (ref 26.0–34.0)
MCHC: 31.9 g/dL (ref 30.0–36.0)
MCV: 92.7 fL (ref 80.0–100.0)
Platelets: 239 10*3/uL (ref 150–400)
RBC: 3.58 MIL/uL — ABNORMAL LOW (ref 3.87–5.11)
RDW: 14.7 % (ref 11.5–15.5)
WBC: 5.8 10*3/uL (ref 4.0–10.5)
nRBC: 0 % (ref 0.0–0.2)

## 2022-03-21 NOTE — Progress Notes (Signed)
Occupational Therapy Treatment Patient Details Name: Nancy Suarez MRN: [id number] DOB: 04-13-1932 Today's Date: 03/21/2022   History of present illness Pt is a 88 y/o female presenting on 9/10 after fall. CT 9/10 with small subdural along posterior falx, follow up CT 9/11 with unchanged subdural and subacute L lateral frontal cortical infarct. Noted admission 8/29-8/30 with subarachnoid hemorrhage after fall. PMH includes: HTN, scoliosis, R intertrochanteric IM nail 2023, ORIF femur 2018, L TKA 2015, dementia.   OT comments  Patient standing at door upon entry with sitter at side.  Patient pleasantly confused, speaks of going to the 3rd floor and of the [place] war. Requires multiple redirections throughout session, completing transfers with min assist and functional mobility with min assist using RW.  She is visibly fatigued but requires max encouragement to sit down. Once sitting, affect becomes more flat and declines to engage in further ADLs. Continue to recommend further OT at SNF level at this time.  Will follow.    Recommendations for follow up therapy are one component of a multi-disciplinary discharge planning process, led by the attending physician.  Recommendations may be updated based on patient status, additional functional criteria and insurance authorization.    Follow Up Recommendations  Skilled nursing-short term rehab (<3 hours/day)    Assistance Recommended at Discharge Frequent or constant Supervision/Assistance  Patient can return home with the following  A little help with walking and/or transfers;A lot of help with bathing/dressing/bathroom;Assistance with cooking/housework;Direct supervision/assist for medications management;Direct supervision/assist for financial management;Assist for transportation;Help with stairs or ramp for entrance   Equipment Recommendations  Other (comment) (defer)    Recommendations for Other Services      Precautions / Restrictions  Precautions Precautions: Fall Restrictions Weight Bearing Restrictions: No       Mobility Bed Mobility               General bed mobility comments: OOB upon entry    Transfers                         Balance Overall balance assessment: Needs assistance Sitting-balance support: No upper extremity supported, Feet supported Sitting balance-Leahy Scale: Fair     Standing balance support: Bilateral upper extremity supported, During functional activity Standing balance-Leahy Scale: Poor Standing balance comment: relies on BUE and external support                           ADL either performed or assessed with clinical judgement   ADL Overall ADL's : Needs assistance/impaired                         Toilet Transfer: Minimal assistance;Ambulation;Rolling walker (2 wheels)           Functional mobility during ADLs: Minimal assistance;Rolling walker (2 wheels) General ADL Comments: pt assisted to arm chair at sink, pt declined further engagement in ADLs. Pleasantly confused.    Extremity/Trunk Assessment              Vision       Perception     Praxis      Cognition Arousal/Alertness: Awake/alert Behavior During Therapy: Anxious, Flat affect Overall Cognitive Status: History of cognitive impairments - at baseline                                 General  Comments: pt with poor awareness of safety and deficits, she is standing at door upon entry (sitter at side) visibally fatigued and refusing to sit down.  Requires redirection multiple times with poor recall of tasks asked to complete.        Exercises      Shoulder Instructions       General Comments sitter at side    Pertinent Vitals/ Pain       Pain Assessment Pain Assessment: Faces Faces Pain Scale: No hurt Pain Intervention(s): Monitored during session  Home Living                                          Prior  Functioning/Environment              Frequency  Min 2X/week        Progress Toward Goals  OT Goals(current goals can now be found in the care plan section)  Progress towards OT goals: Progressing toward goals  Acute Rehab OT Goals Patient Stated Goal: none stated OT Goal Formulation: With patient Time For Goal Achievement: 03/28/22 Potential to Achieve Goals: Good  Plan Discharge plan remains appropriate;Frequency remains appropriate    Co-evaluation                 AM-PAC OT "6 Clicks" Daily Activity     Outcome Measure   Help from another person eating meals?: A Little Help from another person taking care of personal grooming?: A Little Help from another person toileting, which includes using toliet, bedpan, or urinal?: A Lot Help from another person bathing (including washing, rinsing, drying)?: A Lot Help from another person to put on and taking off regular upper body clothing?: A Little Help from another person to put on and taking off regular lower body clothing?: A Little 6 Click Score: 16    End of Session Equipment Utilized During Treatment: Rolling walker (2 wheels)  OT Visit Diagnosis: Unsteadiness on feet (R26.81);Muscle weakness (generalized) (M62.81);Other symptoms and signs involving cognitive function   Activity Tolerance Patient tolerated treatment well   Patient Left in chair;with call bell/phone within reach;with nursing/sitter in room   Nurse Communication Mobility status        Time: 1740-8144 OT Time Calculation (min): 12 min  Charges: OT General Charges $OT Visit: 1 Visit OT Treatments $Self Care/Home Management : 8-22 mins  Jolaine Artist, OT Acute Rehabilitation Services Office [phone]   Nancy Suarez 03/21/2022, 1:56 PM

## 2022-03-21 NOTE — TOC Progression Note (Signed)
Transition of Care [facility]) - Progression Note    Patient Details  Name: Nancy Suarez MRN: [id number] Date of Birth: Jul 12, 1931  Transition of Care [facility]) CM/SW [place], [place] Phone Number: 03/21/2022, 3:28 PM  Clinical Narrative:   CSW notified that patient did not have a good night, became agitated and pulled IV. Awaiting patient to be less confused before pursuing SNF. CSW updated daughter, [name], via phone, and informed that [facility] had rescinded bed offer. CSW to attempt to search for SNF bed once patient's confusion has improved.     Expected Discharge Plan: [place] Barriers to Discharge: Continued Medical Work up, Ship broker, Requiring sitter/restraints  Expected Discharge Plan and Services Expected Discharge Plan: [place] arrangements for the past 2 months: Single Family Home                                       Social Determinants of Health (SDOH) Interventions    Readmission Risk Interventions    12/01/2021    1:14 PM  Readmission Risk Prevention Plan  Transportation Screening Complete  PCP or Specialist Appt within 3-5 Days Complete  HRI or [place] Complete  Social Work Consult for [place] Planning/Counseling Complete  Palliative Care Screening Complete  Medication Review Press photographer) Complete

## 2022-03-21 NOTE — Plan of Care (Signed)
  Problem: Clinical Measurements: Goal: Will remain free from infection Outcome: Progressing Goal: Diagnostic test results will improve Outcome: Progressing Goal: Respiratory complications will improve Outcome: Progressing Goal: Cardiovascular complication will be avoided Outcome: Progressing   Problem: Activity: Goal: Risk for activity intolerance will decrease Outcome: Progressing   Problem: Nutrition: Goal: Adequate nutrition will be maintained Outcome: Progressing   Problem: Elimination: Goal: Will not experience complications related to bowel motility Outcome: Progressing Goal: Will not experience complications related to urinary retention Outcome: Progressing   Problem: Skin Integrity: Goal: Risk for impaired skin integrity will decrease Outcome: Progressing   Problem: Safety: Goal: Non-violent Restraint(s) Outcome: Progressing

## 2022-03-21 NOTE — TOC Progression Note (Signed)
Transition of Care [facility]) - Progression Note    Patient Details  Name: Nancy Suarez MRN: [id number] Date of Birth: 01-27-1932  Transition of Care [facility]) CM/SW [place], [place] Phone Number: 03/21/2022, 3:27 PM  Clinical Narrative:   CSW spoke with patient's daughter, [name], to update on bed offers. [name] in agreement with [facility]. CSW checked with [facility] about bed availability, and [facility] asked questions about patient's mental status and behaviors. CSW answered how much better patient was doing, but [facility] rescinded bed offer due to concern that patient's behaviors could deteriorate again. CSW to follow.    Expected Discharge Plan: [place] Barriers to Discharge: Continued Medical Work up, Ship broker, Requiring sitter/restraints  Expected Discharge Plan and Services Expected Discharge Plan: [place] arrangements for the past 2 months: Single Family Home                                       Social Determinants of Health (SDOH) Interventions    Readmission Risk Interventions    12/01/2021    1:14 PM  Readmission Risk Prevention Plan  Transportation Screening Complete  PCP or Specialist Appt within 3-5 Days Complete  HRI or [place] Complete  Social Work Consult for [place] Planning/Counseling Complete  Palliative Care Screening Complete  Medication Review Press photographer) Complete

## 2022-03-21 NOTE — Progress Notes (Addendum)
[name]  [id number] DOB: 14-Mar-1932 DOA: 03/12/2022 PCP: Vincente Liberty, MD   Brief Narrative: Nancy Suarez is a 88 y.o. female with a history of subarachnoid hemorrhage, anemia, hyperlipidemia, depression, GERD, CKD stage IIIa. Patient presented secondary to falls and was found to have evidence of an acute subdural hematoma in addition to AMS and AKI. Subdural hematoma managed conservatively per neurosurgery recommendations. AKI managed IV fluids. Medications adjusted to improve mental status change. Admission complicated by suicidal ideations for which psychiatry was consulted with recommendations for no inpatient behavioral health admission.  Assessment and Plan:  Recurrent falls Possibly related to medication effect vs generalized weakness. PT/OT recommending SNF on discharge.  Acute metabolic encephalopathy Presumed multifactorial and secondary to AKI, possibly underlying dementia, medication effect. Patient is very pleasant today. No requirement for sitters overnight. Patient would benefit from neuropsychiatry evaluation as an outpatient. Encephalopathy initially resolved but has some waxing and waning symptoms of confusion. -Discontinue Atarax -Delirium  AKI on CKD stage IIIa Baseline creatinine appears to be around 1.4 - 1.5. Peak creatinine of 2.10 which was on admission with improvement on IV fluids.  Subdural hematoma Secondary to fall and head injury. CT head (9/10) on admission noted evidence of small amount of subdural blood along posterior falx. Repeat CT head (9/11) unchanged. Neurosurgery, Dr. Zada Finders, recommended no surgical intervention and no neurosurgical follow-up.  Suicidal ideation Psychiatry consulted and recommended no inpatient admission.  Hyperlipidemia -Continue Lipitor  GERD -Continue Pepcid  Chronic anemia Both iron deficiency and chronic disease documented as etiology for chronic anemia. Anemia panel is  significant for low-normal ferritin. Would favor more of an iron deficiency picture but iron levels stable.  Allergic rhinitis Noted. Minimize antihistamines if able secondary to patient's age.  Lung nodule Right upper lobe, measuring 1.9 cm with recommendations for 3 month follow-up with repeat chest CT vs PET-CT vs tissue sampling.   DVT prophylaxis: SCDs Code Status:   Code Status: Full Code Family Communication: Daughter on telephone Disposition Plan: Discharge to SNF once a bed is available. Medically stable likely in 24 hours now that she has recurrent confusion.   Consultants:  Psychiatry Neurosurgery  Procedures:  None  Antimicrobials: None    Subjective: Patient reports wanting her medications this morning. She also reports wanting medications to give to her daughter that were prescribed by her PCP.  Objective: BP (!) 155/64 (BP Location: Left Arm)   Pulse 84   Temp 98 F (36.7 C) (Oral)   Resp 18   Ht 5\' 5"  (1.651 m)   Wt 60.9 kg   SpO2 99%   BMI 22.34 kg/m   Examination:  General exam: Appears calm and comfortable Respiratory system: Clear to auscultation. Respiratory effort normal. Cardiovascular system: S1 & S2 heard, RRR. Gastrointestinal system: Abdomen is nondistended, soft and nontender. Normal bowel sounds heard. Central nervous system: Alert and oriented to self but otherwise confused. Musculoskeletal: No edema. No calf tenderness   Data Reviewed: I have personally reviewed following labs and imaging studies  CBC Lab Results  Component Value Date   WBC 4.8 03/18/2022   RBC 3.35 (L) 03/19/2022   HGB 10.2 (L) 03/18/2022   HCT 31.5 (L) 03/18/2022   MCV 92.9 03/18/2022   MCH 30.1 03/18/2022   PLT 192 03/18/2022   MCHC 32.4 03/18/2022   RDW 15.4 03/18/2022   LYMPHSABS 0.8 03/14/2022   MONOABS 0.6 03/14/2022   EOSABS 0.1 03/14/2022   BASOSABS 0.0 03/14/2022  Last metabolic panel Lab Results  Component Value Date   NA 141  03/18/2022   K 4.2 03/18/2022   CL 108 03/18/2022   CO2 24 03/18/2022   BUN 26 (H) 03/18/2022   CREATININE 1.52 (H) 03/18/2022   GLUCOSE 128 (H) 03/18/2022   GFRNONAA 33 (L) 03/18/2022   GFRAA 41 (L) 06/11/2017   CALCIUM 9.4 03/18/2022   PHOS 3.0 03/18/2022   PROT 5.6 (L) 03/18/2022   ALBUMIN 3.1 (L) 03/18/2022   BILITOT 0.7 03/18/2022   ALKPHOS 74 03/18/2022   AST 19 03/18/2022   ALT 14 03/18/2022   ANIONGAP 9 03/18/2022    GFR: Estimated Creatinine Clearance: 23 mL/min (A) (by C-G formula based on SCr of 1.52 mg/dL (H)).  No results found for this or any previous visit (from the past 240 hour(s)).    Radiology Studies: No results found.    LOS: 8 days    Cordelia Poche, MD [facility] 03/21/2022, 9:41 AM   If 7PM-7AM, please contact night-coverage [email or web address]

## 2022-03-21 NOTE — Progress Notes (Signed)
Spent 35" with patient explaining IV U/S process. Pt confused to day,date, place & her age. Patient would say yes to proceed than forget what we were doing and pulled at my wrist and ultrasound probe. Let pt talk about herself than she would tell me to proceed again - same thing than said flat out no IV stick - pulling at U/S probe and cord. Thinks she is at school and trying to get out of bed. SR up and bed alarm on - notified RN & MD that I cannot attain IV access currently.

## 2022-03-21 NOTE — Progress Notes (Signed)
Patient standing at bedside with RN and NT refusing to get back into bed or chair. Explained patient is high fall risk and importance of chair and bed alarms and the need for safety and preventing another fall. Patient also refusing labs and IV team IV placement. MD notified. Sitter order placed as patient is confused, forgetful, oriented only to self and high fall risk with hx of recent [facility] [place] and this admission dt fall with subdural bleed and visible edema and hematoma on L eye.

## 2022-03-22 DIAGNOSIS — R296 Repeated falls: Secondary | ICD-10-CM | POA: Diagnosis not present

## 2022-03-22 DIAGNOSIS — R45851 Suicidal ideations: Secondary | ICD-10-CM | POA: Diagnosis not present

## 2022-03-22 DIAGNOSIS — N179 Acute kidney failure, unspecified: Secondary | ICD-10-CM | POA: Diagnosis not present

## 2022-03-22 DIAGNOSIS — D509 Iron deficiency anemia, unspecified: Secondary | ICD-10-CM | POA: Diagnosis not present

## 2022-03-22 MED ORDER — MELATONIN 3 MG PO TABS
3.0000 mg | ORAL_TABLET | Freq: Every day | ORAL | Status: DC
  Administered 2022-03-22 – 2022-03-25 (×4): 3 mg via ORAL
  Filled 2022-03-22 (×4): qty 1

## 2022-03-22 NOTE — Progress Notes (Signed)
Mobility Specialist: Progress Note   03/22/22 1413  Mobility  Activity Ambulated with assistance in hallway  Level of Assistance Minimal assist, patient does 75% or more  Assistive Device Front wheel walker  Distance Ambulated (ft) 110 ft  Activity Response Tolerated well  $Mobility charge 1 Mobility   Received pt in chair having no complaints and agreeable to mobility. Pt was asymptomatic throughout ambulation and returned to room w/o fault. Left in chair w/ call bell in reach and all needs met.  [facility] Eartha Vonbehren Mobility Specialist Mobility Specialist [address]: [phone]

## 2022-03-22 NOTE — Progress Notes (Signed)
[facility] PROGRESS NOTE    Progress Note  Nancy Suarez  [id number] DOB: 1931/12/30 DOA: 03/12/2022 PCP: Vincente Liberty, MD     Brief Narrative:   Nancy Suarez is an 88 y.o. female past medical history of subarachnoid hemorrhage, normocytic anemia, chronic disease stage IIIa comes in secondary to a fall and found to have evidence of acute subdural hematoma and additional 2 altered mental status and acute kidney injury.  Subdural hematoma was made conservatively by neurosurgery.  Acute kidney injury was managed with IV fluids.  Medications were adjusted to improve her mental status.  Admission was also complicated by suicidal ideation for which psychiatry was consulted recommended no inpatient behavioral health admission.  Needs to be 24 hours without a sitter.   Assessment/Plan:   Recurrent frequent falls: Possibly related to medication effect versus generalized weakness. Physical therapy evaluated the patient recommended skilled nursing facility.  Acute metabolic encephalopathy/acute confusional state: Likely multifactorial in the setting of acute kidney injury, underlying dementia and medication. Patient would benefit from neuropsychiatric evaluation as an outpatient. Encephalopathy present on admission has now resolved she has waxing and waning symptoms of confusion. Atarax was discontinued more concerned about acute confusional state at this point in time. We will try to discontinue sitter today.  Acute kidney injury on chronic kidney disease stage IIIa: Likely prerenal azotemia resolved with IV fluid hydration her creatinine has returned to baseline from 1.3-1.5.  Subdural hematoma: Secondary to fall, appreciated on CT on 03/12/2022, neurosurgery was consulted recommended conservative management, repeated CT scan was unchanged.  Suicidal ideation: Psych was consulted recommended outpatient follow-up.  Hyperlipidemia: Continue statins.  Chronic  anemia: Likely due to anemia of chronic disease and iron deficiency anemia. Currently on iron therapy.  Allergic rhinitis: Noted.  Right upper lobe nodule: Measuring about 1.9 cm seen on CT Will need to follow-up as an outpatient and repeat a CT scan in 3 to 6 months.   DVT prophylaxis: lovenox Family Communication:none Status is: Inpatient Remains inpatient appropriate because: Agitation    Code Status:     Code Status Orders  (From admission, onward)           Start     Ordered   03/13/22 2337  Full code  Continuous        03/13/22 2336           Code Status History     Date Active Date Inactive Code Status Order ID Comments User Context   02/28/2022 2125 03/01/2022 2133 Full Code [id number]  Rhetta Mura, DO ED   11/27/2021 1412 12/05/2021 1946 Full Code [id number]  Jonnie Finner, DO ED   04/14/2021 0318 04/17/2021 1922 Full Code [id number]  Kristopher Oppenheim, DO Inpatient   06/05/2017 1947 06/13/2017 0049 Full Code [id number]  Ivor Costa, MD ED   01/19/2014 1604 01/21/2014 1727 Full Code [id number]  Babish, Lucille Passy, PA-C Inpatient         IV Access:   Peripheral IV   Procedures and diagnostic studies:   No results found.   Medical Consultants:   None.   Subjective:    Nancy Suarez no complaints this morning.  Objective:    Vitals:   03/21/22 1119 03/21/22 1554 03/21/22 1953 03/22/22 0800  BP: 119/64 (!) 159/63 (!) 150/65 (!) 160/71  Pulse: 88 89 89 67  Resp: 18 16 18 18   Temp:   98.2 F (36.8 C) 98.2 F (36.8 C)  TempSrc:   Oral Oral  SpO2:  98% 97% 100% 92%  Weight:      Height:       SpO2: 92 %  No intake or output data in the 24 hours ending 03/22/22 0937 Filed Weights   03/17/22 0425 03/18/22 0436 03/20/22 0500  Weight: 60 kg 60.5 kg 60.9 kg    Exam: General exam: In no acute distress. Respiratory system: Good air movement and clear to auscultation. Cardiovascular system: S1 & S2 heard, RRR. No JVD.  Gastrointestinal  system: Abdomen is nondistended, soft and nontender.  Extremities: No pedal edema. Skin: No rashes, lesions or ulcers Psychiatry: No judgment or insight of medical condition   Data Reviewed:    Labs: Basic Metabolic Panel: Recent Labs  Lab 03/16/22 0451 03/18/22 0946 03/21/22 1342  NA 141 141 142  K 4.8 4.2 4.6  CL 110 108 106  CO2 24 24 26   GLUCOSE 100* 128* 106*  BUN 26* 26* 32*  CREATININE 1.45* 1.52* 1.59*  CALCIUM 9.0 9.4 9.8  MG 2.1 2.1  --   PHOS  --  3.0  --    GFR Estimated Creatinine Clearance: 22 mL/min (A) (by C-G formula based on SCr of 1.59 mg/dL (H)). Liver Function Tests: Recent Labs  Lab 03/18/22 0946  AST 19  ALT 14  ALKPHOS 74  BILITOT 0.7  PROT 5.6*  ALBUMIN 3.1*   No results for input(s): "LIPASE", "AMYLASE" in the last 168 hours. No results for input(s): "AMMONIA" in the last 168 hours. Coagulation profile No results for input(s): "INR", "PROTIME" in the last 168 hours. COVID-19 Labs  No results for input(s): "DDIMER", "FERRITIN", "LDH", "CRP" in the last 72 hours.  Lab Results  Component Value Date   SARSCOV2NAA NEGATIVE 04/13/2021    CBC: Recent Labs  Lab 03/16/22 0451 03/18/22 0946 03/21/22 1342  WBC 3.7* 4.8 5.8  HGB 9.6* 10.2* 10.6*  HCT 29.8* 31.5* 33.2*  MCV 92.5 92.9 92.7  PLT 213 192 239   Cardiac Enzymes: No results for input(s): "CKTOTAL", "CKMB", "CKMBINDEX", "TROPONINI" in the last 168 hours. BNP (last 3 results) No results for input(s): "PROBNP" in the last 8760 hours. CBG: No results for input(s): "GLUCAP" in the last 168 hours. D-Dimer: No results for input(s): "DDIMER" in the last 72 hours. Hgb A1c: No results for input(s): "HGBA1C" in the last 72 hours. Lipid Profile: No results for input(s): "CHOL", "HDL", "LDLCALC", "TRIG", "CHOLHDL", "LDLDIRECT" in the last 72 hours. Thyroid function studies: No results for input(s): "TSH", "T4TOTAL", "T3FREE", "THYROIDAB" in the last 72 hours.  Invalid  input(s): "FREET3" Anemia work up: No results for input(s): "VITAMINB12", "FOLATE", "FERRITIN", "TIBC", "IRON", "RETICCTPCT" in the last 72 hours. Sepsis Labs: Recent Labs  Lab 03/16/22 0451 03/18/22 0946 03/21/22 1342  WBC 3.7* 4.8 5.8   Microbiology No results found for this or any previous visit (from the past 240 hour(s)).   Medications:    acetaminophen  1,000 mg Oral BID   atorvastatin  20 mg Oral Daily   divalproex  125 mg Oral BID   escitalopram  10 mg Oral Daily   famotidine  10 mg Oral Daily   multivitamin with minerals  1 tablet Oral Daily   polyvinyl alcohol  1 drop Both Eyes TID   sodium chloride flush  3 mL Intravenous Q12H   Continuous Infusions:    LOS: 9 days   Charlynne Cousins  [facility]  03/22/2022, 9:37 AM

## 2022-03-23 ENCOUNTER — Telehealth: Payer: Self-pay | Admitting: *Deleted

## 2022-03-23 DIAGNOSIS — R296 Repeated falls: Secondary | ICD-10-CM | POA: Diagnosis not present

## 2022-03-23 DIAGNOSIS — G9340 Encephalopathy, unspecified: Secondary | ICD-10-CM | POA: Diagnosis not present

## 2022-03-23 MED ORDER — HALOPERIDOL LACTATE 5 MG/ML IJ SOLN
2.0000 mg | Freq: Once | INTRAMUSCULAR | Status: DC
  Filled 2022-03-23: qty 1

## 2022-03-23 MED ORDER — RISPERIDONE 0.5 MG PO TABS: 0.5000 mg | ORAL_TABLET | Freq: Every evening | ORAL | Status: DC | PRN

## 2022-03-23 MED ORDER — HALOPERIDOL LACTATE 5 MG/ML IJ SOLN
3.0000 mg | Freq: Four times a day (QID) | INTRAMUSCULAR | Status: DC | PRN
  Administered 2022-03-25 (×2): 3 mg via INTRAMUSCULAR
  Filled 2022-03-23 (×2): qty 1

## 2022-03-23 MED ORDER — HALOPERIDOL 0.5 MG PO TABS: 3.0000 mg | ORAL_TABLET | Freq: Once | ORAL | Status: DC

## 2022-03-23 MED ORDER — HALOPERIDOL LACTATE 5 MG/ML IJ SOLN
2.0000 mg | Freq: Four times a day (QID) | INTRAMUSCULAR | Status: DC | PRN
  Administered 2022-03-23: 2 mg via INTRAMUSCULAR

## 2022-03-23 MED ORDER — QUETIAPINE FUMARATE 25 MG PO TABS
25.0000 mg | ORAL_TABLET | Freq: Every evening | ORAL | Status: DC | PRN
  Administered 2022-03-23 – 2022-03-25 (×2): 25 mg via ORAL
  Filled 2022-03-23 (×2): qty 1

## 2022-03-23 NOTE — Progress Notes (Signed)
[facility] PROGRESS NOTE    Progress Note  Nancy Suarez  [id number] DOB: 05-15-1932 DOA: 03/12/2022 PCP: Vincente Liberty, MD     Brief Narrative:   Nancy Suarez is an 88 y.o. female past medical history of subarachnoid hemorrhage, normocytic anemia, chronic disease stage IIIa comes in secondary to a fall and found to have evidence of acute subdural hematoma and additional 2 altered mental status and acute kidney injury.  Subdural hematoma was made conservatively by neurosurgery.  Acute kidney injury was managed with IV fluids.  Medications were adjusted to improve her mental status.  Admission was also complicated by suicidal ideation for which psychiatry was consulted recommended no inpatient behavioral health admission.  Needs to be 24 hours without a sitter.   Assessment/Plan:   Recurrent frequent falls: Possibly related to medication effect versus generalized weakness. Physical therapy evaluated the patient recommended skilled nursing facility.  Acute metabolic encephalopathy/acute confusional state: Likely multifactorial in the setting of acute kidney injury, underlying undiagnosed dementia and medication. Patient would benefit from neuropsychiatric evaluation as an outpatient. Encephalopathy present on admission has now resolved she has waxing and waning symptoms of confusion. Atarax was discontinued, we  more concerned about acute confusional state superimposed of undiagnosed underlying at this point in time. We will try to discontinue sitter today.  Acute kidney injury on chronic kidney disease stage IIIa: Likely prerenal azotemia resolved with IV fluid hydration her creatinine has returned to baseline from 1.3-1.5.  Subdural hematoma: Secondary to fall, appreciated on CT on 03/12/2022, neurosurgery was consulted recommended conservative management, repeated CT scan was unchanged.  Suicidal ideation: Psych was consulted recommended outpatient  follow-up.  Hyperlipidemia: Continue statins.  Chronic anemia: Likely due to anemia of chronic disease and iron deficiency anemia. Currently on iron therapy.  Allergic rhinitis: Noted.  Right upper lobe nodule: Measuring about 1.9 cm seen on CT Will need to follow-up as an outpatient and repeat a CT scan in 3 to 6 months.   DVT prophylaxis: lovenox Family Communication:none Status is: Inpatient Remains inpatient appropriate because: agitation needs to be improved and without a sitter for 24hrs in order to get her to rehab.    Code Status:     Code Status Orders  (From admission, onward)           Start     Ordered   03/13/22 2337  Full code  Continuous        03/13/22 2336           Code Status History     Date Active Date Inactive Code Status Order ID Comments User Context   02/28/2022 2125 03/01/2022 2133 Full Code [id number]  Rhetta Mura, DO ED   11/27/2021 1412 12/05/2021 1946 Full Code [id number]  Jonnie Finner, DO ED   04/14/2021 0318 04/17/2021 1922 Full Code [id number]  Kristopher Oppenheim, DO Inpatient   06/05/2017 1947 06/13/2017 0049 Full Code [id number]  Ivor Costa, MD ED   01/19/2014 1604 01/21/2014 1727 Full Code [id number]  Babish, Lucille Passy, PA-C Inpatient         IV Access:   Peripheral IV   Procedures and diagnostic studies:   No results found.   Medical Consultants:   None.   Subjective:    Nancy Suarez confused no complains  Objective:    Vitals:   03/22/22 1112 03/22/22 1924 03/23/22 0326 03/23/22 0715  BP: (!) 142/58 (!) 162/61 (!) 139/54 (!) 156/60  Pulse: 96 98 76 90  Resp:  18 18 18 20   Temp: 98.4 F (36.9 C) 97.9 F (36.6 C) 97.7 F (36.5 C) 97.8 F (36.6 C)  TempSrc:  Oral Oral Oral  SpO2: 91% 99% 100% 99%  Weight:      Height:       SpO2: 99 %   Intake/Output Summary (Last 24 hours) at 03/23/2022 0958 Last data filed at 03/23/2022 0800 Gross per 24 hour  Intake 20 ml  Output 650 ml  Net -630 ml    Filed Weights   03/17/22 0425 03/18/22 0436 03/20/22 0500  Weight: 60 kg 60.5 kg 60.9 kg    Exam: General exam: In no acute distress. Respiratory system: Good air movement and clear to auscultation. Cardiovascular system: S1 & S2 heard, RRR. No JVD.  Gastrointestinal system: Abdomen is nondistended, soft and nontender.  Extremities: No pedal edema. Skin: No rashes, lesions or ulcers Psychiatry: No judgment or insight of medical condition   Data Reviewed:    Labs: Basic Metabolic Panel: Recent Labs  Lab 03/18/22 0946 03/21/22 1342  NA 141 142  K 4.2 4.6  CL 108 106  CO2 24 26  GLUCOSE 128* 106*  BUN 26* 32*  CREATININE 1.52* 1.59*  CALCIUM 9.4 9.8  MG 2.1  --   PHOS 3.0  --     GFR Estimated Creatinine Clearance: 22 mL/min (A) (by C-G formula based on SCr of 1.59 mg/dL (H)). Liver Function Tests: Recent Labs  Lab 03/18/22 0946  AST 19  ALT 14  ALKPHOS 74  BILITOT 0.7  PROT 5.6*  ALBUMIN 3.1*    No results for input(s): "LIPASE", "AMYLASE" in the last 168 hours. No results for input(s): "AMMONIA" in the last 168 hours. Coagulation profile No results for input(s): "INR", "PROTIME" in the last 168 hours. COVID-19 Labs  No results for input(s): "DDIMER", "FERRITIN", "LDH", "CRP" in the last 72 hours.  Lab Results  Component Value Date   [place] NEGATIVE 04/13/2021    CBC: Recent Labs  Lab 03/18/22 0946 03/21/22 1342  WBC 4.8 5.8  HGB 10.2* 10.6*  HCT 31.5* 33.2*  MCV 92.9 92.7  PLT 192 239    Cardiac Enzymes: No results for input(s): "CKTOTAL", "CKMB", "CKMBINDEX", "TROPONINI" in the last 168 hours. BNP (last 3 results) No results for input(s): "PROBNP" in the last 8760 hours. CBG: No results for input(s): "GLUCAP" in the last 168 hours. D-Dimer: No results for input(s): "DDIMER" in the last 72 hours. Hgb A1c: No results for input(s): "HGBA1C" in the last 72 hours. Lipid Profile: No results for input(s): "CHOL", "HDL",  "LDLCALC", "TRIG", "CHOLHDL", "LDLDIRECT" in the last 72 hours. Thyroid function studies: No results for input(s): "TSH", "T4TOTAL", "T3FREE", "THYROIDAB" in the last 72 hours.  Invalid input(s): "FREET3" Anemia work up: No results for input(s): "VITAMINB12", "FOLATE", "FERRITIN", "TIBC", "IRON", "RETICCTPCT" in the last 72 hours. Sepsis Labs: Recent Labs  Lab 03/18/22 0946 03/21/22 1342  WBC 4.8 5.8    Microbiology No results found for this or any previous visit (from the past 240 hour(s)).   Medications:    acetaminophen  1,000 mg Oral BID   atorvastatin  20 mg Oral Daily   divalproex  125 mg Oral BID   escitalopram  10 mg Oral Daily   famotidine  10 mg Oral Daily   melatonin  3 mg Oral QHS   multivitamin with minerals  1 tablet Oral Daily   polyvinyl alcohol  1 drop Both Eyes TID   sodium chloride flush  3  mL Intravenous Q12H   Continuous Infusions:    LOS: 10 days   Charlynne Cousins  [facility]  03/23/2022, 9:58 AM

## 2022-03-23 NOTE — Progress Notes (Signed)
Pt. Refusing telemetry and IV.  MD paged and notified although this is not a change from before.

## 2022-03-23 NOTE — Progress Notes (Signed)
Pt. Refusing vitals at this time.  Pt. More calm and attempted to work with the patient and obtain vitals after she eats to give her some feeling of control

## 2022-03-23 NOTE — Progress Notes (Signed)
Pt. Being belligerent.  Olevia Bowens, MD notified.  IM haldol given.  Pt. Trying to hit staff, screaming.  Refusing all care

## 2022-03-23 NOTE — Progress Notes (Deleted)
Pt. Family member gave the patient 4 cups of water un-thickened.  I informed them that he was on thickened liquids only as this was our therapists recommendations for his safety at this time.

## 2022-03-23 NOTE — Progress Notes (Signed)
PT Cancellation Note  Patient Details Name: Nancy Suarez MRN: [id number] DOB: Feb 03, 1932   Cancelled Treatment:    Reason Eval/Treat Not Completed: Patient not medically ready. RN asks that PT hold at this time, stating pt just received Haldol 2 combative and agitated.   Thelma Comp 03/23/2022, 12:26 PM  Rolinda Roan, PT, DPT Acute Rehabilitation Services Secure Chat Preferred Office: [phone]

## 2022-03-23 NOTE — Care Management Important Message (Signed)
Important Message  Patient Details  Name: Nancy Suarez MRN: [id number] Date of Birth: 1932-02-16   Medicare Important Message Given:  Yes     Hannah Beat 03/23/2022, 12:43 PM

## 2022-03-23 NOTE — Progress Notes (Signed)
Mobility Specialist: Progress Note   03/23/22 1009  Mobility  Activity Refused mobility   Pt refused mobility stating she will get up when she's ready. Will f/u as able.   [facility] Ruthy Forry Mobility Specialist Mobility Specialist [address]: [phone]

## 2022-03-24 ENCOUNTER — Inpatient Hospital Stay (HOSPITAL_COMMUNITY): Payer: Medicare Other

## 2022-03-24 DIAGNOSIS — D509 Iron deficiency anemia, unspecified: Secondary | ICD-10-CM | POA: Diagnosis not present

## 2022-03-24 DIAGNOSIS — R4182 Altered mental status, unspecified: Secondary | ICD-10-CM

## 2022-03-24 DIAGNOSIS — R569 Unspecified convulsions: Secondary | ICD-10-CM | POA: Diagnosis not present

## 2022-03-24 DIAGNOSIS — N179 Acute kidney failure, unspecified: Secondary | ICD-10-CM | POA: Diagnosis not present

## 2022-03-24 DIAGNOSIS — R296 Repeated falls: Secondary | ICD-10-CM | POA: Diagnosis not present

## 2022-03-24 DIAGNOSIS — G9340 Encephalopathy, unspecified: Secondary | ICD-10-CM | POA: Diagnosis not present

## 2022-03-24 LAB — CBC
HCT: 31.3 % — ABNORMAL LOW (ref 36.0–46.0)
Hemoglobin: 9.9 g/dL — ABNORMAL LOW (ref 12.0–15.0)
MCH: 29.8 pg (ref 26.0–34.0)
MCHC: 31.6 g/dL (ref 30.0–36.0)
MCV: 94.3 fL (ref 80.0–100.0)
Platelets: 216 10*3/uL (ref 150–400)
RBC: 3.32 MIL/uL — ABNORMAL LOW (ref 3.87–5.11)
RDW: 14.6 % (ref 11.5–15.5)
WBC: 6.9 10*3/uL (ref 4.0–10.5)
nRBC: 0 % (ref 0.0–0.2)

## 2022-03-24 LAB — GLUCOSE, CAPILLARY: Glucose-Capillary: 114 mg/dL — ABNORMAL HIGH (ref 70–99)

## 2022-03-24 LAB — URINALYSIS, ROUTINE W REFLEX MICROSCOPIC
Bacteria, UA: NONE SEEN
Bilirubin Urine: NEGATIVE
Glucose, UA: NEGATIVE mg/dL
Hgb urine dipstick: NEGATIVE
Ketones, ur: NEGATIVE mg/dL
Nitrite: NEGATIVE
Protein, ur: NEGATIVE mg/dL
Specific Gravity, Urine: 1.024 (ref 1.005–1.030)
pH: 5 (ref 5.0–8.0)

## 2022-03-24 MED ORDER — DIVALPROEX SODIUM 125 MG PO CSDR
500.0000 mg | DELAYED_RELEASE_CAPSULE | Freq: Two times a day (BID) | ORAL | Status: DC
  Administered 2022-03-24: 500 mg via ORAL
  Filled 2022-03-24: qty 4

## 2022-03-24 MED ORDER — VALPROATE SODIUM 100 MG/ML IV SOLN: 1000.0000 mg | Freq: Once | INTRAVENOUS | Status: DC

## 2022-03-24 MED ORDER — LORAZEPAM 2 MG/ML IJ SOLN
INTRAMUSCULAR | Status: AC
  Administered 2022-03-24: 1 mg via INTRAMUSCULAR
  Filled 2022-03-24: qty 1

## 2022-03-24 MED ORDER — DIVALPROEX SODIUM 125 MG PO CSDR: 500.0000 mg | DELAYED_RELEASE_CAPSULE | Freq: Three times a day (TID) | ORAL | Status: DC

## 2022-03-24 MED ORDER — DIVALPROEX SODIUM 125 MG PO CSDR
250.0000 mg | DELAYED_RELEASE_CAPSULE | Freq: Three times a day (TID) | ORAL | Status: DC
  Administered 2022-03-24 – 2022-03-31 (×17): 250 mg via ORAL
  Filled 2022-03-24 (×19): qty 2

## 2022-03-24 MED ORDER — LEVETIRACETAM IN NACL 1000 MG/100ML IV SOLN
1000.0000 mg | Freq: Once | INTRAVENOUS | Status: DC
  Administered 2022-03-24: 1000 mg via INTRAVENOUS
  Filled 2022-03-24: qty 100

## 2022-03-24 MED ORDER — LORAZEPAM 2 MG/ML IJ SOLN: 1.0000 mg | Freq: Once | INTRAMUSCULAR | Status: AC

## 2022-03-24 NOTE — Progress Notes (Signed)
Physical Therapy Treatment Patient Details Name: Nancy Suarez MRN: [id number] DOB: 09-05-1931 Today's Date: 03/24/2022   History of Present Illness Pt is a 88 y/o female presenting on 9/10 after fall. CT 9/10 with small subdural along posterior falx, follow up CT 9/11 with unchanged subdural and subacute L lateral frontal cortical infarct. Noted admission 8/29-8/30 with subarachnoid hemorrhage after fall. PMH includes: HTN, scoliosis, R intertrochanteric IM nail 2023, ORIF femur 2018, L TKA 2015, dementia.    PT Comments    Patient well at start of session and eager to mobilize. Min assist required with min cues for sequencing bed mobility and transfer from EOB and toilet with RW. Pt overall steady ambulating in room with walker with occasional assist to position walker in safe proximity. Patient required support of sink for standing balance to complete dynamic tasks. VSS and agreeable to ambulate in hall. After ~40' ambulation in hall pt c/o feeling weak and that she felt like she was "going down". Chair provided immediately and pt began to have "seizure like event". RN, NTx2, and PT all with pt to maintain safety, Total +2 to transfer from hall chair to more stable recliner and back into room. After RN's and MD present to attend to pt. After event and symptoms resolved pt answer questions appropriately, awake and able to follow commands. Pt able to bring washcloth to face with bil UE's to wash saliva from chin demonstrating return of UE function (Lt UE was flaccid during event and Rt weak as well). Mod-Max +2 for safety to stand pivot back to bed.. NT and and physician in room with pt and seizure pads in place on bed. Will continue to progress during acute stay.     Recommendations for follow up therapy are one component of a multi-disciplinary discharge planning process, led by the attending physician.  Recommendations may be updated based on patient status, additional functional criteria and  insurance authorization.  Follow Up Recommendations  Skilled nursing-short term rehab (<3 hours/day) Can patient physically be transported by private vehicle: Yes   Assistance Recommended at Discharge Frequent or constant Supervision/Assistance  Patient can return home with the following A little help with walking and/or transfers;A little help with bathing/dressing/bathroom;Assistance with cooking/housework;Assist for transportation;Help with stairs or ramp for entrance   Equipment Recommendations  None recommended by PT    Recommendations for Other Services       Precautions / Restrictions Precautions Precautions: Fall Restrictions Weight Bearing Restrictions: No     Mobility  Bed Mobility Overal bed mobility: Needs Assistance Bed Mobility: Supine to Sit     Supine to sit: Min assist, HOB elevated     General bed mobility comments: pt initaited bringing LE's off EOB and reaching for bed rails with cues. Min assist to raise trunk fully upright.    Transfers Overall transfer level: Needs assistance Equipment used: Rolling walker (2 wheels) Transfers: Sit to/from Stand, Bed to chair/wheelchair/BSC Sit to Stand: Min assist, Max assist, +2 safety/equipment, +2 physical assistance   Step pivot transfers: Mod assist, +2 physical assistance, +2 safety/equipment, Total assist, Max assist       General transfer comment: Min assist for stand from EOB and toilet at start of session. min VC's for safe hand placement needed on RW and on to use grab bar in bathroom. During seizure like event in hallway significant change in function: Mod assist to sit in hallway chair immediately prior to onset of event. Total +2 assist to stand with RN/PT blocking knees  and rising pt to stand pivot to more stable recliner from hallway chair. After episode fully resolved Mod-Max for extra safety propvided with [facility] RN/PT to stand step recliner>bed for return to supine.     Ambulation/Gait Ambulation/Gait assistance: Min assist, +2 safety/equipment Gait Distance (Feet): 70 Feet Assistive device: Rolling walker (2 wheels) Gait Pattern/deviations: Step-through pattern, Decreased stride length Gait velocity: decr     General Gait Details: Pt steady with RW to amb bed>bathroom with Min assist and VC to maintain safe proximity to RW. pt's trunk slightly flexed but no buckling throughout. VSS and HR in 70's-80's and SpO2 98% on RA during gait. Pt amb to sink for hand hygeine and then to hallway for short boout. After ~ 18' in hallway pt c/o "feeling weak" and leaned forward on walker slightly. Pt stated "I'm going to go down". hall chair provided immediately and pt required mod assist to sit. Pt began to have seizure like symptoms in sitting. RN, NT, sitter, and PT all with pt.   Stairs             Wheelchair Mobility    Modified Rankin (Stroke Patients Only)       Balance Overall balance assessment: Needs assistance Sitting-balance support: No upper extremity supported, Feet supported Sitting balance-Leahy Scale: Fair Sitting balance - Comments: dynamically min guard when completing pericare on toilet   Standing balance support: Bilateral upper extremity supported, During functional activity Standing balance-Leahy Scale: Poor Standing balance comment: relies on BUE and external support, pt leaning on sink for hand hygeine.                            Cognition Arousal/Alertness: Awake/alert Behavior During Therapy: WFL for tasks assessed/performed Overall Cognitive Status: History of cognitive impairments - at baseline                                 General Comments: pt pleasant and eager to mobilize at start of session. requesting to go to bathroom. pt having normal conversation throughout toileting, hand hygeine, and ambulation until seizure began in hallway (see chart for detailed documentation).         Exercises      General Comments        Pertinent Vitals/Pain Pain Assessment Pain Assessment: No/denies pain Pain Location: no pain at start of session or end, pt reported discomfort with BM due to constipation but resolved after successful BM. Pain Descriptors / Indicators: Aching Pain Intervention(s): Limited activity within patient's tolerance, Monitored during session, Repositioned    Home Living                          Prior Function            PT Goals (current goals can now be found in the care plan section) Acute Rehab PT Goals Patient Stated Goal: not stated PT Goal Formulation: With patient Time For Goal Achievement: 03/31/22 Potential to Achieve Goals: Good Progress towards PT goals: Progressing toward goals    Frequency    Min 2X/week      PT Plan Current plan remains appropriate    Co-evaluation              AM-PAC PT "6 Clicks" Mobility   Outcome Measure  Help needed turning from your back to your side while in a flat  bed without using bedrails?: A Little Help needed moving from lying on your back to sitting on the side of a flat bed without using bedrails?: A Little Help needed moving to and from a bed to a chair (including a wheelchair)?: A Little Help needed standing up from a chair using your arms (e.g., wheelchair or bedside chair)?: A Little Help needed to walk in hospital room?: A Lot Help needed climbing 3-5 steps with a railing? : A Lot 6 Click Score: 16    End of Session Equipment Utilized During Treatment: Gait belt Activity Tolerance: Treatment limited secondary to medical complications (Comment) Patient left: in bed;with call bell/phone within reach;with nursing/sitter in room;Other (comment) (Neuro NP in room) Nurse Communication: Mobility status;Other (comment) (Seizure like event) PT Visit Diagnosis: Unsteadiness on feet (R26.81);Other symptoms and signs involving the nervous system (R29.898)     Time:  8676-1950 PT Time Calculation (min) (ACUTE ONLY): 51 min  Charges:  $Gait Training: 8-22 mins $Therapeutic Activity: 8-22 mins                     Wynn Maudlin, DPT Acute Rehabilitation Services Office [phone] Pager [phone]  03/24/22 10:56 AM

## 2022-03-24 NOTE — Progress Notes (Signed)
OT Cancellation Note  Patient Details Name: Nancy Suarez MRN: [id number] DOB: 20-Apr-1932   Cancelled Treatment:    Reason Eval/Treat Not Completed: Medical issues which prohibited therapy. Pt with seizure like event during PT session, OT will hold off at this time.   Jolaine Artist, OT Acute Rehabilitation Services Office [phone]   Nancy Suarez 03/24/2022, 10:34 AM

## 2022-03-24 NOTE — Progress Notes (Signed)
Pt. Was working with PT and walking in the hall, patient was alert, conversing, and then reported feeling weak mid walk, then stated she felt like she was going to go down.  Chair placed under patient and pt. Sat down.  Patient began to pass out and shake, eyes rhythmically moving to the right and up.  Left side of body seemed to be more flaccid during the event.  Pt. Was max assist +2 assist moved to a recliner from hallway chair.  This lasted roughly 3 minutes and patient was given 1mg  of IM ativan with MD [place] at patient's side.  O2 and HR remained stable during the event.  Post vitals obtained and in the chart.  Suction set up in room, attempting to establish IV access, placing patient on telemetry and bed rail guards being applied.

## 2022-03-24 NOTE — Progress Notes (Signed)
[facility]  PROGRESS NOTE  Nancy Suarez [id number] DOB: 1932-03-16 DOA: 03/12/2022 PCP: Vincente Liberty, MD   Brief HPI:   88 y.o. female past medical history of subarachnoid hemorrhage, normocytic anemia, chronic disease stage IIIa comes in secondary to a fall and found to have evidence of acute subdural hematoma and additional 2 altered mental status and acute kidney injury.  Subdural hematoma was managed conservatively by neurosurgery.  Acute kidney injury was managed with IV fluids.  Medications were adjusted to improve her mental status.  Admission was also complicated by suicidal ideation for which psychiatry was consulted recommended no inpatient behavioral health admission.  Needs to be 24 hours without a sitter.    Subjective   Patient had seizure in the hallway while working with PT today.  1 mg IM Ativan given and also 1 g of Keppra was given.   Assessment/Plan:     Recurrent falls -Likely from medication side effect versus generalized weakness -PT saw the patient and recommend skilled nursing facility for rehab  Acute metabolic encephalopathy/confusional state -Likely multifactorial in the setting of acute kidney injury, underlying undiagnosed dementia and medications -Patient will benefit from neuropsychiatric evaluation as outpatient -She was encephalopathic on admission which has now resolved  New onset seizure -Patient had new onset seizure while working with physical therapy in the hallway -1 g of IV Keppra and 1 mg of IM Ativan given -Seizure lasted only for few minutes -We will do an EEG -Consult neurology  Acute kidney injury on CKD stage IIIa -Likely prerenal azotemia -Improved with IV fluids -Today creatinine is 1.59  Subdural hematoma -Secondary to fall, seen on CT head on 03/12/2022 -Neurosurgery was consulted, recommended conservative management  Suicidal ideation -Psych was consulted -recommended outpatient  follow-up  Hyperlipidemia -Continue statins  Anemia of chronic disease -Normocytic anemia -Hemoglobin 9.9 -Currently on iron therapy  Right upper lobe nodule -Measuring 1.9 cm on CT chest -Will need follow-up as outpatient and repeat CT scan in 3 to 6 months   Medications     acetaminophen  1,000 mg Oral BID   atorvastatin  20 mg Oral Daily   divalproex  500 mg Oral BID   escitalopram  10 mg Oral Daily   famotidine  10 mg Oral Daily   haloperidol  3 mg Oral Once   melatonin  3 mg Oral QHS   multivitamin with minerals  1 tablet Oral Daily   polyvinyl alcohol  1 drop Both Eyes TID   sodium chloride flush  3 mL Intravenous Q12H     Data Reviewed:   CBG:  Recent Labs  Lab 03/24/22 1053  GLUCAP 114*    SpO2: 95 %    Vitals:   03/24/22 0729 03/24/22 0754 03/24/22 1000 03/24/22 1100  BP: (!) 169/65 (!) 157/69 (!) 111/52 (!) 118/57  Pulse: 64 72 77 74  Resp: 18   20  Temp: 97.8 F (36.6 C)   97.9 F (36.6 C)  TempSrc: Oral   Axillary  SpO2: 100%  98% 95%  Weight:      Height:          Data Reviewed:  Basic Metabolic Panel: Recent Labs  Lab 03/18/22 0946 03/21/22 1342  NA 141 142  K 4.2 4.6  CL 108 106  CO2 24 26  GLUCOSE 128* 106*  BUN 26* 32*  CREATININE 1.52* 1.59*  CALCIUM 9.4 9.8  MG 2.1  --   PHOS 3.0  --     CBC: Recent  Labs  Lab 03/18/22 0946 03/21/22 1342 03/24/22 1230  WBC 4.8 5.8 6.9  HGB 10.2* 10.6* 9.9*  HCT 31.5* 33.2* 31.3*  MCV 92.9 92.7 94.3  PLT 192 239 216    LFT Recent Labs  Lab 03/18/22 0946  AST 19  ALT 14  ALKPHOS 74  BILITOT 0.7  PROT 5.6*  ALBUMIN 3.1*     Antibiotics: Anti-infectives (From admission, onward)    None        DVT prophylaxis: SCDs  Code Status: Full code  Family Communication: No family at bedside   CONSULTS neurology   Objective    Physical Examination:   General-appears in no acute distress Heart-S1-S2, regular, no murmur auscultated Lungs-clear to  auscultation bilaterally, no wheezing or crackles auscultated Abdomen-soft, nontender, no organomegaly Extremities-no edema in the lower extremities Neuro-alert, confused, following commands Status is: Inpatient:             Oswald Hillock   [facility] If 7PM-7AM, please contact night-coverage at [email or web address], Office  [phone]   03/24/2022, 1:27 PM  LOS: 11 days

## 2022-03-24 NOTE — Consult Note (Addendum)
Neurology Consultation  Reason for Consult: Seizure activity   Referring Physician: Dr. Darrick Meigs  CC: Frequent falls  History is obtained from:medical record and RN at bedside, PT at bedside   HPI: Nancy Suarez is an 88 y.o. female with past medical history of recent hospital admission for [facility] s/p mechanical fall, anxiety and depression, HTN, HLD, GERD, anemia, CKD who presented to hospital for evaluation of frequent falls and found to have SDH on CT head. She was admitted to [facility] on 03/12/2022. Neurosurgery had been consulted and recommended conservative management.   Today she was working with PT, walking in the hallway when she stated she "felt weak" and that she was "going down". The chair was able to placed under her and she became unresponsive, eyes with rightward gaze, followed by whole body shaking. Her left side was noted to be flaccid during the event. This event lasted 3 minutes. She was also noted to be incontinent of urine. When she came to, she was noted to be confused. She was given 1 mg IV ativan. Per RN at the bedside she appears to be back to baseline. Neurology was consulted for assistance   ROS:  Unable to obtain due to altered mental status.   Past Medical History:  Diagnosis Date   Allergic rhinitis    ASCUS (atypical squamous cells of undetermined significance) on Pap smear    Atrophic vaginitis    Chronic insomnia    Depression    GERD (gastroesophageal reflux disease)    Goiter    History of kidney stones    Hypercholesteremia    Hypertension    LGSIL (low grade squamous intraepithelial dysplasia)    Osteopenia    Ovarian cyst    "shrank it"   Pancreatitis    Scoliosis    Spinal arthritis      Family History  Problem Relation Age of Onset   Hypertension Father    Cancer Father    Cancer Mother        Bone   Cancer Brother      Social History:   reports that she quit smoking about 43 years ago. Her smoking use included cigarettes. She has quit using  smokeless tobacco. She reports current alcohol use. She reports that she does not use drugs.  Medications  Current Facility-Administered Medications:    acetaminophen (TYLENOL) tablet 650 mg, 650 mg, Oral, Q6H PRN **OR** acetaminophen (TYLENOL) suppository 650 mg, 650 mg, Rectal, Q6H PRN, Howerter, Justin B, DO   acetaminophen (TYLENOL) tablet 1,000 mg, 1,000 mg, Oral, BID, Sherwood Gambler, MD, 1,000 mg at 03/24/22 0920   atorvastatin (LIPITOR) tablet 20 mg, 20 mg, Oral, Daily, Pokhrel, Laxman, MD, 20 mg at 03/24/22 0920   divalproex (DEPAKOTE SPRINKLE) capsule 125 mg, 125 mg, Oral, BID, Cosby, Courtney, MD, 125 mg at 03/24/22 [id number]   docusate sodium (COLACE) capsule 100 mg, 100 mg, Oral, Daily PRN, Sherwood Gambler, MD   escitalopram (LEXAPRO) tablet 10 mg, 10 mg, Oral, Daily, Cosby, Courtney, MD, 10 mg at 03/24/22 0920   famotidine (PEPCID) tablet 10 mg, 10 mg, Oral, Daily, Reome, Earle J, RPH, 10 mg at 03/24/22 0920   guaiFENesin-dextromethorphan (ROBITUSSIN DM) 100-10 MG/5ML syrup 5 mL, 5 mL, Oral, Q4H PRN, Mariel Aloe, MD, 5 mL at 03/18/22 1559   haloperidol (HALDOL) tablet 3 mg, 3 mg, Oral, Once **OR** [DISCONTINUED] haloperidol lactate (HALDOL) injection 2 mg, 2 mg, Intramuscular, Once, Charlynne Cousins, MD   haloperidol lactate (HALDOL) injection 3 mg, 3  mg, Intramuscular, Q6H PRN **OR** risperiDONE (RISPERDAL) tablet 0.5 mg, 0.5 mg, Oral, QHS PRN, Charlynne Cousins, MD   hydrALAZINE (APRESOLINE) injection 5 mg, 5 mg, Intravenous, Q6H PRN, Irene Pap N, DO, 5 mg at 03/18/22 0853   levETIRAcetam (KEPPRA) IVPB 1000 mg/100 mL premix, 1,000 mg, Intravenous, Once, [place], Marge Duncans, MD   melatonin tablet 3 mg, 3 mg, Oral, QHS, Charlynne Cousins, MD, 3 mg at 03/23/22 2150   multivitamin with minerals tablet 1 tablet, 1 tablet, Oral, Daily, Sherwood Gambler, MD, 1 tablet at 03/24/22 0920   polyvinyl alcohol (LIQUIFILM TEARS) 1.4 % ophthalmic solution 1 drop, 1 drop, Both Eyes, TID,  Sherwood Gambler, MD, 1 drop at 03/23/22 2150   QUEtiapine (SEROQUEL) tablet 25 mg, 25 mg, Oral, QHS PRN, Charlynne Cousins, MD, 25 mg at 03/23/22 2150   sodium chloride flush (NS) 0.9 % injection 3 mL, 3 mL, Intravenous, Q12H, Sherwood Gambler, MD, 3 mL at 03/20/22 2123   sodium chloride flush (NS) 0.9 % injection 3 mL, 3 mL, Intravenous, PRN, Sherwood Gambler, MD, 3 mL at 03/19/22 2249   Exam: Current vital signs: BP (!) 111/52 (BP Location: Right Arm)   Pulse 77   Temp 97.8 F (36.6 C) (Oral)   Resp 18   Ht 5\' 5"  (1.651 m)   Wt 60.9 kg   SpO2 98%   BMI 22.34 kg/m  Vital signs in last 24 hours: Temp:  [97.5 F (36.4 C)-98.4 F (36.9 C)] 97.8 F (36.6 C) (09/22 0729) Pulse Rate:  [64-77] 77 (09/22 1000) Resp:  [16-18] 18 (09/22 0729) BP: (111-169)/(52-71) 111/52 (09/22 1000) SpO2:  [98 %-100 %] 98 % (09/22 1000)  GENERAL: Awake, alert in NAD HEENT: - Normocephalic and atraumatic, dry mm LUNGS - Clear to auscultation bilaterally with no wheezes CV - S1S2 RRR, no m/r/g, equal pulses bilaterally. ABDOMEN - Soft, nontender, nondistended with normoactive BS Ext: warm, well perfused, intact peripheral pulses, no edema  NEURO:  Mental Status: AA&Ox2, self, place. For age she stated "32" and then corrected to "89" Language: speech is clear.  Naming, repetition, fluency, and comprehension intact. Cranial Nerves: PERRL EOMI, visual fields full, no facial asymmetry, facial sensation intact, hearing intact, tongue/uvula/soft palate midline, normal sternocleidomastoid and trapezius muscle strength. No evidence of tongue atrophy or fasciculations  Motor: 5/5 in bilateral uppers, bilateral lowers 4/5 Tone: is normal and bulk is normal Sensation- Intact to light touch bilaterally Coordination: FTN intact bilaterally, no ataxia in BLE. Gait- deferred    Labs I have reviewed labs in epic and the results pertinent to this consultation are:  CBC    Component Value Date/Time   WBC  5.8 03/21/2022 1342   RBC 3.58 (L) 03/21/2022 1342   HGB 10.6 (L) 03/21/2022 1342   HCT 33.2 (L) 03/21/2022 1342   PLT 239 03/21/2022 1342   MCV 92.7 03/21/2022 1342   MCH 29.6 03/21/2022 1342   MCHC 31.9 03/21/2022 1342   RDW 14.7 03/21/2022 1342   LYMPHSABS 0.8 03/14/2022 0312   MONOABS 0.6 03/14/2022 0312   EOSABS 0.1 03/14/2022 0312   BASOSABS 0.0 03/14/2022 0312    CMP     Component Value Date/Time   NA 142 03/21/2022 1342   K 4.6 03/21/2022 1342   CL 106 03/21/2022 1342   CO2 26 03/21/2022 1342   GLUCOSE 106 (H) 03/21/2022 1342   BUN 32 (H) 03/21/2022 1342   CREATININE 1.59 (H) 03/21/2022 1342   CALCIUM 9.8 03/21/2022 1342  PROT 5.6 (L) 03/18/2022 0946   ALBUMIN 3.1 (L) 03/18/2022 0946   AST 19 03/18/2022 0946   ALT 14 03/18/2022 0946   ALKPHOS 74 03/18/2022 0946   BILITOT 0.7 03/18/2022 0946   GFRNONAA 31 (L) 03/21/2022 1342   GFRAA 41 (L) 06/11/2017 0404    Lipid Panel  No results found for: "CHOL", "TRIG", "HDL", "CHOLHDL", "VLDL", "LDLCALC", "LDLDIRECT"   Imaging I have reviewed the images obtained:  CT-head 9/11: 1. Small posterior right parafalcine and left tentorial subdural bleed, unchanged. 2. No parenchymal hemorrhage is seen. 3. Subacute lateral left frontal cortical infarct 4. No mass effect.  No further hemorrhage.  CT scan ordered today: Completed with official report pending  Assessment:  Nancy Suarez is an 88 y.o. female with recent hospital admission for [facility] s/p mechanical fall, anxiety and depression, HTN, HLD, GERD, anemia and CKD who presented to the hospital on 9/11 for evaluation of frequent falls and was found to have a SDH on CT head. Today she was noted to have new onset seizure. - Exam reveals cognitive impairment and tangential speech, perseveration and poor attention.  - Imaging:  - CT head 9/11: Small posterior right parafalcine and left tentorial subdural bleed, unchanged. Subacute lateral left frontal cortical infarct -  Repeat CT head today: Report pending - EEG: This study is within normal limits. No seizures or epileptiform discharges were seen throughout the recording. - Etiology for the patient's new onset seizure is most likely cortical irritation from recent subdural hemorrhage. The subacute left frontal lobe cortical infarct may also serve as a seizure focus.  - On Depakote 250 mg qhs which was started during this admission by Psychiatry for agitation. Per Psychiatry, she likely has an underlying dementia which has contributed to her agitation and sundowning this admission.    Recommendations: - Seizure precautions - Keppra 1000 mg given IV x 1 (already given). No need to continue as a scheduled dose, as she will be covered with increased dosing of Depakote - Increase Depakote to 250 mg PO TID - Check UA, CBC, CXR to r/o infection  - Obtain Depakote level in 2 days (ordered)   Beulah Gandy DNP, ACNPC-AG   I have seen and examined the patient. I have formulated the assessment and recommendations. 88 year old female admitted on 9/11 with traumatic subdural bleed. Now with new onset seizure. Recommendations as above.  Electronically signed: Dr. Kerney Elbe

## 2022-03-24 NOTE — Progress Notes (Signed)
Pt. Continues to refuse telemetry and has no IV.  Darrick Meigs, MD made aware.

## 2022-03-24 NOTE — Procedures (Signed)
Patient Name: Nancy Suarez  MRN: [id number]  Epilepsy Attending: Lora Havens  Referring Physician/Provider: Oswald Hillock, MD  Date: 03/24/2022 Duration: 23.15 mins  Patient history: 87 year old female with seizure-like activity and altered mental status.  EEG to evaluate for seizure.  Level of alertness: Awake  AEDs during EEG study: Ativan, Keppra  Technical aspects: This EEG study was done with scalp electrodes positioned according to the 10-20 International system of electrode placement. Electrical activity was reviewed with band pass filter of 1-70Hz , sensitivity of 7 uV/mm, display speed of 53mm/sec with a 60Hz  notched filter applied as appropriate. EEG data were recorded continuously and digitally stored.  Video monitoring was available and reviewed as appropriate.  Description: The posterior dominant rhythm consists of 8 Hz activity of moderate voltage (25-35 uV) seen predominantly in posterior head regions, symmetric and reactive to eye opening and eye closing. Hyperventilation and photic stimulation were not performed.     IMPRESSION: This study is within normal limits. No seizures or epileptiform discharges were seen throughout the recording.  A normal interictal EEG does not exclude nor support the diagnosis of epilepsy. If suspicion for interictal activity remains a concern, a prolonged study can be considered.   Nancy Suarez

## 2022-03-24 NOTE — Progress Notes (Signed)
Mobility Specialist: Progress Note   03/24/22 1605  Mobility  Activity Contraindicated/medical hold   Instructed to not see pt today d/t "seizure like event" with PT earlier today. Will f/u as able.   [facility] Willaim Mode Mobility Specialist Mobility Specialist [address]: [phone]

## 2022-03-24 NOTE — Progress Notes (Signed)
EEG complete - results pending 

## 2022-03-25 DIAGNOSIS — G9340 Encephalopathy, unspecified: Secondary | ICD-10-CM | POA: Diagnosis not present

## 2022-03-25 DIAGNOSIS — N179 Acute kidney failure, unspecified: Secondary | ICD-10-CM | POA: Diagnosis not present

## 2022-03-25 DIAGNOSIS — R296 Repeated falls: Secondary | ICD-10-CM | POA: Diagnosis not present

## 2022-03-25 DIAGNOSIS — S065XAA Traumatic subdural hemorrhage with loss of consciousness status unknown, initial encounter: Secondary | ICD-10-CM | POA: Diagnosis not present

## 2022-03-25 LAB — COMPREHENSIVE METABOLIC PANEL
ALT: 16 U/L (ref 0–44)
AST: 17 U/L (ref 15–41)
Albumin: 3 g/dL — ABNORMAL LOW (ref 3.5–5.0)
Alkaline Phosphatase: 69 U/L (ref 38–126)
Anion gap: 5 (ref 5–15)
BUN: 24 mg/dL — ABNORMAL HIGH (ref 8–23)
CO2: 25 mmol/L (ref 22–32)
Calcium: 8.8 mg/dL — ABNORMAL LOW (ref 8.9–10.3)
Chloride: 108 mmol/L (ref 98–111)
Creatinine, Ser: 1.53 mg/dL — ABNORMAL HIGH (ref 0.44–1.00)
GFR, Estimated: 33 mL/min — ABNORMAL LOW (ref >60)
Glucose, Bld: 95 mg/dL (ref 70–99)
Potassium: 4.5 mmol/L (ref 3.5–5.1)
Sodium: 138 mmol/L (ref 135–145)
Total Bilirubin: 0.5 mg/dL (ref 0.3–1.2)
Total Protein: 5.4 g/dL — ABNORMAL LOW (ref 6.5–8.1)

## 2022-03-25 LAB — CBC
HCT: 30.4 % — ABNORMAL LOW (ref 36.0–46.0)
Hemoglobin: 9.4 g/dL — ABNORMAL LOW (ref 12.0–15.0)
MCH: 29.7 pg (ref 26.0–34.0)
MCHC: 30.9 g/dL (ref 30.0–36.0)
MCV: 95.9 fL (ref 80.0–100.0)
Platelets: 201 10*3/uL (ref 150–400)
RBC: 3.17 MIL/uL — ABNORMAL LOW (ref 3.87–5.11)
RDW: 14.8 % (ref 11.5–15.5)
WBC: 6.3 10*3/uL (ref 4.0–10.5)
nRBC: 0 % (ref 0.0–0.2)

## 2022-03-25 NOTE — Progress Notes (Signed)
Patient became belligerent again and was swinging at staff and we were unable to get her to calm down. PRN IM haldol given.  MD Ghimire notified

## 2022-03-25 NOTE — Progress Notes (Signed)
Mobility Specialist Progress Note:   03/25/22 1513  Mobility  Activity Contraindicated/medical hold   Instructed to not see pt today by RN d/t "seizure like event" yesterday. Will f/u as able.   Nancy Suarez Mobility Specialist-Acute Rehab Secure Chat only

## 2022-03-25 NOTE — Progress Notes (Signed)
Pt possibly sundowning, removing Telemetry monitor, refusing to take meds, hold in mouth, finally got pt swallow meds. Pt resist care. Called family to comfort patient. Sitter is helpful at bedside. Will cont to monitor. SRP, RN

## 2022-03-25 NOTE — Progress Notes (Signed)
PROGRESS NOTE        PATIENT DETAILS Name: Nancy Suarez Age: 88 y.o. Sex: female Date of Birth: [birth year] Admit Date: 03/12/2022 Admitting Physician Kayleen Memos, DO [id number], Iona Beard, MD  Brief Summary: Patient is a 88 y.o.  female with prior history of SAH, CKD stage IIIa-who presented following a fall-was found to have SDH, AKI.  Patient was admitted to [facility] service-neurosurgery recommended not surgical management-AKI was managed with supportive care-supportive care, further hospital course was complicated by confusion, suicidal ideation requiring psychiatric consultation, and new onset seizure.  See below for further details  Significant events: 9/10>> admit-fall-small SDH. 9/22>> seizure-like activity after working with physical therapy.  Significant studies: 9/10>> CT head: Small TH-posterior falx. 9/11>> CT head: SDH-unchanged. 9/16>> CT chest: Right upper lobe nodule-1.9 cm. 9/22>> CT head: Resolution of previously seen small SDH. 9/22>> Spot EEG: No seizures.   Significant microbiology data: None  Procedures: None  Consults: Neuro surgery, neurology, psychiatry  Subjective: Sitter at bedside-seems to be awake/alert and answering simple questions appropriately.  Following all of my commands.  Objective: Vitals: Blood pressure (!) 131/57, pulse 69, temperature 98.2 F (36.8 C), temperature source Oral, resp. rate 16, height 5\' 5"  (1.651 m), weight 60.9 kg, SpO2 95 %.   Exam: Gen Exam:Alert awake-not in any distress HEENT:atraumatic, normocephalic Chest: B/L clear to auscultation anteriorly CVS:S1S2 regular Abdomen:soft non tender, non distended Extremities:no edema Neurology: Non focal Skin: no rash  Pertinent Labs/Radiology:    Latest Ref Rng & Units 03/24/2022   12:30 PM 03/21/2022    1:42 PM 03/18/2022    9:46 AM  CBC  WBC 4.0 - 10.5 K/uL 6.9  5.8  4.8   Hemoglobin 12.0 - 15.0 g/dL 9.9  10.6  10.2   Hematocrit 36.0 -  46.0 % 31.3  33.2  31.5   Platelets 150 - 400 K/uL 216  239  192     Lab Results  Component Value Date   NA 142 03/21/2022   K 4.6 03/21/2022   CL 106 03/21/2022   CO2 26 03/21/2022     Assessment/Plan: Recurrent mechanical fall Due to frailty/advanced age-possibly medication effect-evaluated by PT/OT-recommendations are for SNF.  Acute metabolic encephalopathy Delirium superimposed on possible undiagnosed dementia Encephalopathy felt to be due to AKI-seems to have improved as answering simple questions/following commands.  Continue to maintain delirium precautions.  Continue Depakote-dosage escalated since she had seizure on 9/22.  SDH Nonsurgical management recommended by neurosurgery.  New onset seizure Appreciate neurology input-Depakote dosage escalated-no further seizures.  Suicidal ideation This was in the setting of delirium/encephalopathy-evaluated by psychiatry-inpatient psychiatric admission not recommended-however one-to-one sitter continued for safety.  History of depression Continue Lexapro  AKI on CKD stage IIIa AKI hemodynamically mediated-improved-close to baseline.  HLD Continue statin  Right upper lobe lung nodule Incidental finding-repeat CT chest in 3 to 6 months or PET/CT as an outpatient.   BMI: Estimated body mass index is 22.34 kg/m as calculated from the following:   Height as of this encounter: 5\' 5"  (1.651 m).   Weight as of this encounter: 60.9 kg.   Code status:   Code Status: Full Code   DVT Prophylaxis: SCDs Start: 03/13/22 2337   Family Communication: Daughter-[name]-[phone]-left VM 9/23   Disposition Plan: Status is: Inpatient Remains inpatient appropriate because: Resolving encephalopathy-new onset seizures-not yet stable for discharge.  Planned Discharge Destination:Skilled nursing facility   Diet: Diet Order             Diet regular Room service appropriate? Yes; Fluid consistency: Thin  Diet effective now                      Antimicrobial agents: Anti-infectives (From admission, onward)    None        MEDICATIONS: Scheduled Meds:  acetaminophen  1,000 mg Oral BID   atorvastatin  20 mg Oral Daily   divalproex  250 mg Oral Q8H   escitalopram  10 mg Oral Daily   famotidine  10 mg Oral Daily   haloperidol  3 mg Oral Once   melatonin  3 mg Oral QHS   multivitamin with minerals  1 tablet Oral Daily   polyvinyl alcohol  1 drop Both Eyes TID   sodium chloride flush  3 mL Intravenous Q12H   Continuous Infusions: PRN Meds:.acetaminophen **OR** acetaminophen, docusate sodium, guaiFENesin-dextromethorphan, haloperidol lactate **OR** risperiDONE, hydrALAZINE, QUEtiapine, sodium chloride flush   I have personally reviewed following labs and imaging studies  LABORATORY DATA: CBC: Recent Labs  Lab 03/21/22 1342 03/24/22 1230  WBC 5.8 6.9  HGB 10.6* 9.9*  HCT 33.2* 31.3*  MCV 92.7 94.3  PLT 239 [id number]    Basic Metabolic Panel: Recent Labs  Lab 03/21/22 1342  NA 142  K 4.6  CL 106  CO2 26  GLUCOSE 106*  BUN 32*  CREATININE 1.59*  CALCIUM 9.8    GFR: Estimated Creatinine Clearance: 22 mL/min (A) (by C-G formula based on SCr of 1.59 mg/dL (H)).  Liver Function Tests: No results for input(s): "AST", "ALT", "ALKPHOS", "BILITOT", "PROT", "ALBUMIN" in the last 168 hours. No results for input(s): "LIPASE", "AMYLASE" in the last 168 hours. No results for input(s): "AMMONIA" in the last 168 hours.  Coagulation Profile: No results for input(s): "INR", "PROTIME" in the last 168 hours.  Cardiac Enzymes: No results for input(s): "CKTOTAL", "CKMB", "CKMBINDEX", "TROPONINI" in the last 168 hours.  BNP (last 3 results) No results for input(s): "PROBNP" in the last 8760 hours.  Lipid Profile: No results for input(s): "CHOL", "HDL", "LDLCALC", "TRIG", "CHOLHDL", "LDLDIRECT" in the last 72 hours.  Thyroid Function Tests: No results for input(s): "TSH", "T4TOTAL", "FREET4",  "T3FREE", "THYROIDAB" in the last 72 hours.  Anemia Panel: No results for input(s): "VITAMINB12", "FOLATE", "FERRITIN", "TIBC", "IRON", "RETICCTPCT" in the last 72 hours.  Urine analysis:    Component Value Date/Time   COLORURINE YELLOW 03/24/2022 [place] 03/24/2022 1835   LABSPEC 1.024 03/24/2022 1835   PHURINE 5.0 03/24/2022 1835   GLUCOSEU NEGATIVE 03/24/2022 1835   HGBUR NEGATIVE 03/24/2022 1835   BILIRUBINUR NEGATIVE 03/24/2022 1835   KETONESUR NEGATIVE 03/24/2022 1835   PROTEINUR NEGATIVE 03/24/2022 1835   UROBILINOGEN 0.2 01/12/2014 1537   NITRITE NEGATIVE 03/24/2022 1835   LEUKOCYTESUR TRACE (A) 03/24/2022 1835    Sepsis Labs: Lactic Acid, Venous No results found for: "LATICACIDVEN"  MICROBIOLOGY: No results found for this or any previous visit (from the past 240 hour(s)).  RADIOLOGY STUDIES/RESULTS: CT HEAD WO CONTRAST (5MM)  Result Date: 03/24/2022 CLINICAL DATA:  Initial evaluation for acute seizure. EXAM: CT HEAD WITHOUT CONTRAST TECHNIQUE: Contiguous axial images were obtained from the base of the skull through the vertex without intravenous contrast. RADIATION DOSE REDUCTION: This exam was performed according to the departmental dose-optimization program which includes automated exposure control, adjustment of the mA and/or kV according to  patient size and/or use of iterative reconstruction technique. COMPARISON:  Prior CT from 03/13/2022. FINDINGS: Brain: Generalized age-related cerebral atrophy with chronic small vessel ischemic disease. Previously seen small subdural hematoma has resolved. No new intracranial hemorrhage. Continued interval evolution of subacute ischemic infarct involving the anterior left frontal lobe. No associated hemorrhage or mass effect. No acute large vessel territory infarct. No mass lesion or midline shift. No hydrocephalus or extra-axial fluid collection. Vascular: No hyperdense vessel. Scattered vascular calcifications  noted within the carotid siphons. Skull: Resolving left periorbital contusion/hematoma. Scalp soft tissues demonstrate no new finding. Calvarium intact. Sinuses/Orbits: Globes orbital soft tissues demonstrate no acute finding. Paranasal sinuses and mastoid air cells are largely clear. Other: None. IMPRESSION: 1. Interval resolution of previously seen small subdural hematoma. No new intracranial hemorrhage. 2. Continued normal expected interval evolution of subacute anterior left frontal infarct. No other new acute intracranial abnormality. 3. Underlying atrophy with chronic small vessel ischemic disease. 4. Resolving left periorbital contusion/hematoma. Electronically Signed   By: Jeannine Boga M.D.   On: 03/24/2022 23:12   EEG adult  Result Date: 03/24/2022 Lora Havens, MD     03/24/2022  4:50 PM Patient Name: LAFREDA CASEBEER MRN: [id number] Epilepsy Attending: Lora Havens Referring Physician/Provider: Oswald Hillock, MD Date: 03/24/2022 Duration: 23.15 mins Patient history: 87 year old female with seizure-like activity and altered mental status.  EEG to evaluate for seizure. Level of alertness: Awake AEDs during EEG study: Ativan, Keppra Technical aspects: This EEG study was done with scalp electrodes positioned according to the 10-20 International system of electrode placement. Electrical activity was reviewed with band pass filter of 1-70Hz , sensitivity of 7 uV/mm, display speed of 57mm/sec with a 60Hz  notched filter applied as appropriate. EEG data were recorded continuously and digitally stored.  Video monitoring was available and reviewed as appropriate. Description: The posterior dominant rhythm consists of 8 Hz activity of moderate voltage (25-35 uV) seen predominantly in posterior head regions, symmetric and reactive to eye opening and eye closing. Hyperventilation and photic stimulation were not performed.   IMPRESSION: This study is within normal limits. No seizures or epileptiform  discharges were seen throughout the recording. A normal interictal EEG does not exclude nor support the diagnosis of epilepsy. If suspicion for interictal activity remains a concern, a prolonged study can be considered. Priyanka Barbra Sarks   DG CHEST PORT 1 VIEW  Result Date: 03/24/2022 CLINICAL DATA:  Altered mental status EXAM: PORTABLE CHEST 1 VIEW COMPARISON:  Previous studies including the chest radiograph done on 03/14/2022 and CT done on 03/18/2022 FINDINGS: Cardiac size is within normal limits. There are no signs of pulmonary edema. There is 1.9 cm nodule in right upper lung fields. Subtle increased markings are seen in left lower lung fields. Right hemidiaphragm is elevated. IMPRESSION: There is 1.9 cm nodule in right upper lung field suggesting possible neoplastic process. Increased markings in left lower lung fields may suggest crowding of bronchovascular structures due to poor inspiration or early pneumonia. Electronically Signed   By: Elmer Picker M.D.   On: 03/24/2022 13:05     LOS: 12 days   Oren Binet, MD  [facility]    To contact the attending provider between 7A-7P or the covering provider during after hours 7P-7A, please log into the web site [email or web address] and access using universal [place] password for that web site. If you do not have the password, please call the hospital operator.  03/25/2022, 10:03 AM

## 2022-03-25 NOTE — Plan of Care (Signed)
?  Problem: Clinical Measurements: ?Goal: Will remain free from infection ?Outcome: Progressing ?Goal: Diagnostic test results will improve ?Outcome: Progressing ?Goal: Respiratory complications will improve ?Outcome: Progressing ?  ?

## 2022-03-25 NOTE — Progress Notes (Signed)
Pt agitated, and belligerent, threw the phone acroos the room. Called dtg Nancy Suarez, to update pt on her status. SA sitter at bedside. Will cont to monitor pt. SRP RN

## 2022-03-25 NOTE — Progress Notes (Addendum)
Pt given night meds, ineffective, ,educated with Haldol 3 mg for agitation. Pt hitting staff and swinging at face. Pulled off telemetry and refusing to replace. Will cont monitor effectiveness of med. SRP, RN

## 2022-03-25 NOTE — Progress Notes (Signed)
Pt dtg called for update, pt talked briefly with dtg, resting without distress. SRP, RN

## 2022-03-26 DIAGNOSIS — R296 Repeated falls: Secondary | ICD-10-CM | POA: Diagnosis not present

## 2022-03-26 LAB — VALPROIC ACID LEVEL: Valproic Acid Lvl: 46 ug/mL — ABNORMAL LOW (ref 50.0–100.0)

## 2022-03-26 MED ORDER — QUETIAPINE FUMARATE 50 MG PO TABS
50.0000 mg | ORAL_TABLET | Freq: Every day | ORAL | Status: DC
  Administered 2022-03-27 – 2022-03-28 (×2): 50 mg via ORAL
  Filled 2022-03-26 (×3): qty 1

## 2022-03-26 MED ORDER — MELATONIN 3 MG PO TABS
3.0000 mg | ORAL_TABLET | Freq: Every day | ORAL | Status: DC
  Administered 2022-03-27 – 2022-03-30 (×4): 3 mg via ORAL
  Filled 2022-03-26 (×5): qty 1

## 2022-03-26 MED ORDER — HALOPERIDOL LACTATE 5 MG/ML IJ SOLN
2.0000 mg | Freq: Four times a day (QID) | INTRAMUSCULAR | Status: DC | PRN
  Administered 2022-03-26 – 2022-03-29 (×3): 2 mg via INTRAMUSCULAR
  Filled 2022-03-26 (×4): qty 1

## 2022-03-26 NOTE — Plan of Care (Signed)
  Problem: Clinical Measurements: Goal: Will remain free from infection Outcome: Progressing Goal: Diagnostic test results will improve Outcome: Progressing   

## 2022-03-26 NOTE — Progress Notes (Signed)
PROGRESS NOTE        PATIENT DETAILS Name: Nancy Suarez Age: 88 y.o. Sex: female Date of Birth: March 06, 1932 Admit Date: 03/12/2022 Admitting Physician Kayleen Memos, DO [id number], Iona Beard, MD  Brief Summary: Patient is a 88 y.o.  female with prior history of SAH, CKD stage IIIa-who presented following a fall-was found to have SDH, AKI.  Patient was admitted to [facility] service-neurosurgery recommended not surgical management-AKI was managed with supportive care-supportive care, further hospital course was complicated by confusion, suicidal ideation requiring psychiatric consultation, and new onset seizure.  See below for further details  Significant events: 9/10>> admit-fall-small SDH. 9/22>> seizure-like activity after working with physical therapy.  Significant studies: 9/10>> CT head: Small TH-posterior falx. 9/11>> CT head: SDH-unchanged. 9/16>> CT chest: Right upper lobe nodule-1.9 cm. 9/22>> CT head: Resolution of previously seen small SDH. 9/22>> Spot EEG: No seizures.   Significant microbiology data: None  Procedures: None  Consults: Neuro surgery, neurology, psychiatry  Subjective:  Patient in bed somnolent, appears to be in no distress, received Haldol earlier this morning for severe agitation and delirium Objective: Vitals: Blood pressure (!) 134/58, pulse 82, temperature 98.4 F (36.9 C), temperature source Oral, resp. rate 18, height 5\' 5"  (1.651 m), weight 60.9 kg, SpO2 100 %.   Exam:  Somnolent but no focal deficits moves all 4 extremities to painful stimuli [place].AT,PERRAL Supple Neck, No JVD,   Symmetrical Chest wall movement, Good air movement bilaterally, CTAB RRR,No Gallops, Rubs or new Murmurs,  +ve B.Sounds, Abd Soft, No tenderness,   No Cyanosis, Clubbing or edema   Assessment/Plan:  Recurrent mechanical fall Due to frailty/advanced age-possibly medication effect-evaluated by PT/OT-recommendations are for  SNF.  Acute metabolic encephalopathy Delirium superimposed on possible undiagnosed dementia Encephalopathy felt to be due to AKI-seems to have improved as answering simple questions/following commands.  Continue to maintain delirium precautions.  Continue Depakote-dosage escalated since she had seizure on 9/22.  Have adjusted Seroquel and Haldol doses for better control of delirium.  SDH Nonsurgical management recommended by neurosurgery.  New onset seizure Appreciate neurology input-Depakote dosage escalated-no further seizures.  Suicidal ideation This was in the setting of delirium/encephalopathy-evaluated by psychiatry-inpatient psychiatric admission not recommended-however one-to-one sitter continued for safety.  History of depression Continue Lexapro  AKI on CKD stage IIIa AKI hemodynamically mediated-improved-close to baseline.  HLD Continue statin  Right upper lobe lung nodule Incidental finding-repeat CT chest in 3 to 6 months or PET/CT as an outpatient.   BMI: Estimated body mass index is 22.34 kg/m as calculated from the following:   Height as of this encounter: 5\' 5"  (1.651 m).   Weight as of this encounter: 60.9 kg.   Code status:   Code Status: Full Code   DVT Prophylaxis: SCDs Start: 03/13/22 2337   Family Communication: Daughter-[name]-[phone]-left VM 9/23   Disposition Plan: Status is: Inpatient Remains inpatient appropriate because: Resolving encephalopathy-new onset seizures-not yet stable for discharge.   Planned Discharge Destination:Skilled nursing facility   Diet: Diet Order             Diet regular Room service appropriate? Yes; Fluid consistency: Thin  Diet effective now                   MEDICATIONS: Scheduled Meds:  acetaminophen  1,000 mg Oral BID   atorvastatin  20 mg Oral Daily  divalproex  250 mg Oral Q8H   escitalopram  10 mg Oral Daily   famotidine  10 mg Oral Daily   melatonin  3 mg Oral QHS   multivitamin  with minerals  1 tablet Oral Daily   polyvinyl alcohol  1 drop Both Eyes TID   QUEtiapine  50 mg Oral QHS   sodium chloride flush  3 mL Intravenous Q12H   Continuous Infusions: PRN Meds:.acetaminophen **OR** acetaminophen, docusate sodium, guaiFENesin-dextromethorphan, haloperidol lactate **OR** [DISCONTINUED] risperiDONE, hydrALAZINE, sodium chloride flush   I have personally reviewed following labs and imaging studies  LABORATORY DATA:  Recent Labs  Lab 03/21/22 1342 03/24/22 1230 03/25/22 1059  WBC 5.8 6.9 6.3  HGB 10.6* 9.9* 9.4*  HCT 33.2* 31.3* 30.4*  PLT 239 216 201  MCV 92.7 94.3 95.9  MCH 29.6 29.8 29.7  MCHC 31.9 31.6 30.9  RDW 14.7 14.6 14.8    Recent Labs  Lab 03/21/22 1342 03/25/22 1059  NA 142 138  K 4.6 4.5  CL 106 108  CO2 26 25  GLUCOSE 106* 95  BUN 32* 24*  CREATININE 1.59* 1.53*  CALCIUM 9.8 8.8*  AST  --  17  ALT  --  16  ALKPHOS  --  69  BILITOT  --  0.5  ALBUMIN  --  3.0*    RADIOLOGY STUDIES/RESULTS: CT HEAD WO CONTRAST (5MM)  Result Date: 03/24/2022 CLINICAL DATA:  Initial evaluation for acute seizure. EXAM: CT HEAD WITHOUT CONTRAST TECHNIQUE: Contiguous axial images were obtained from the base of the skull through the vertex without intravenous contrast. RADIATION DOSE REDUCTION: This exam was performed according to the departmental dose-optimization program which includes automated exposure control, adjustment of the mA and/or kV according to patient size and/or use of iterative reconstruction technique. COMPARISON:  Prior CT from 03/13/2022. FINDINGS: Brain: Generalized age-related cerebral atrophy with chronic small vessel ischemic disease. Previously seen small subdural hematoma has resolved. No new intracranial hemorrhage. Continued interval evolution of subacute ischemic infarct involving the anterior left frontal lobe. No associated hemorrhage or mass effect. No acute large vessel territory infarct. No mass lesion or midline shift. No  hydrocephalus or extra-axial fluid collection. Vascular: No hyperdense vessel. Scattered vascular calcifications noted within the carotid siphons. Skull: Resolving left periorbital contusion/hematoma. Scalp soft tissues demonstrate no new finding. Calvarium intact. Sinuses/Orbits: Globes orbital soft tissues demonstrate no acute finding. Paranasal sinuses and mastoid air cells are largely clear. Other: None. IMPRESSION: 1. Interval resolution of previously seen small subdural hematoma. No new intracranial hemorrhage. 2. Continued normal expected interval evolution of subacute anterior left frontal infarct. No other new acute intracranial abnormality. 3. Underlying atrophy with chronic small vessel ischemic disease. 4. Resolving left periorbital contusion/hematoma. Electronically Signed   By: Jeannine Boga M.D.   On: 03/24/2022 23:12   EEG adult  Result Date: 03/24/2022 Lora Havens, MD     03/24/2022  4:50 PM Patient Name: TENZING VANALLEN MRN: [id number] Epilepsy Attending: Lora Havens Referring Physician/Provider: Oswald Hillock, MD Date: 03/24/2022 Duration: 23.15 mins Patient history: 88 year old female with seizure-like activity and altered mental status.  EEG to evaluate for seizure. Level of alertness: Awake AEDs during EEG study: Ativan, Keppra Technical aspects: This EEG study was done with scalp electrodes positioned according to the 10-20 International system of electrode placement. Electrical activity was reviewed with band pass filter of 1-70Hz , sensitivity of 7 uV/mm, display speed of 41mm/sec with a 60Hz  notched filter applied as appropriate. EEG data were recorded continuously  and digitally stored.  Video monitoring was available and reviewed as appropriate. Description: The posterior dominant rhythm consists of 8 Hz activity of moderate voltage (25-35 uV) seen predominantly in posterior head regions, symmetric and reactive to eye opening and eye closing. Hyperventilation and photic  stimulation were not performed.   IMPRESSION: This study is within normal limits. No seizures or epileptiform discharges were seen throughout the recording. A normal interictal EEG does not exclude nor support the diagnosis of epilepsy. If suspicion for interictal activity remains a concern, a prolonged study can be considered. Priyanka Barbra Sarks   DG CHEST PORT 1 VIEW  Result Date: 03/24/2022 CLINICAL DATA:  Altered mental status EXAM: PORTABLE CHEST 1 VIEW COMPARISON:  Previous studies including the chest radiograph done on 03/14/2022 and CT done on 03/18/2022 FINDINGS: Cardiac size is within normal limits. There are no signs of pulmonary edema. There is 1.9 cm nodule in right upper lung fields. Subtle increased markings are seen in left lower lung fields. Right hemidiaphragm is elevated. IMPRESSION: There is 1.9 cm nodule in right upper lung field suggesting possible neoplastic process. Increased markings in left lower lung fields may suggest crowding of bronchovascular structures due to poor inspiration or early pneumonia. Electronically Signed   By: Elmer Picker M.D.   On: 03/24/2022 13:05     LOS: 13 days   Signature  Lala Lund M.D on 03/26/2022 at 10:01 AM   -  To page go to [email or web address]

## 2022-03-26 NOTE — Progress Notes (Addendum)
Patient been refusing to keep her Tele monitor on, MD aware, order to [place] patient refused to take her Melatonin and Seroquel, she believe its poison, MD aware

## 2022-03-26 NOTE — Progress Notes (Signed)
Pt is aggressive, refusing care, shouting to staff. Unable to calm her down. PRN Haldol IM given. Will continue to monitor.

## 2022-03-26 NOTE — Progress Notes (Signed)
Seen patient on awake on bed, confused. Doesn't want to be touch to check her vital signs. Refusing her Tylenol, Depakote and eye drop tonight, she said she already took some. RN educated about her medicine, still refusing. Will continue to monitor.

## 2022-03-26 NOTE — Progress Notes (Signed)
Meds effective sleeping and aroused easily. Sitter at bedside. SRP, RN

## 2022-03-27 DIAGNOSIS — R296 Repeated falls: Secondary | ICD-10-CM | POA: Diagnosis not present

## 2022-03-27 LAB — CBC WITH DIFFERENTIAL/PLATELET
Abs Immature Granulocytes: 0.05 10*3/uL (ref 0.00–0.07)
Basophils Absolute: 0 10*3/uL (ref 0.0–0.1)
Basophils Relative: 1 %
Eosinophils Absolute: 0.1 10*3/uL (ref 0.0–0.5)
Eosinophils Relative: 2 %
HCT: 32 % — ABNORMAL LOW (ref 36.0–46.0)
Hemoglobin: 10.2 g/dL — ABNORMAL LOW (ref 12.0–15.0)
Immature Granulocytes: 1 %
Lymphocytes Relative: 24 %
Lymphs Abs: 1.1 10*3/uL (ref 0.7–4.0)
MCH: 30 pg (ref 26.0–34.0)
MCHC: 31.9 g/dL (ref 30.0–36.0)
MCV: 94.1 fL (ref 80.0–100.0)
Monocytes Absolute: 0.5 10*3/uL (ref 0.1–1.0)
Monocytes Relative: 11 %
Neutro Abs: 2.7 10*3/uL (ref 1.7–7.7)
Neutrophils Relative %: 61 %
Platelets: 200 10*3/uL (ref 150–400)
RBC: 3.4 MIL/uL — ABNORMAL LOW (ref 3.87–5.11)
RDW: 14.5 % (ref 11.5–15.5)
WBC: 4.5 10*3/uL (ref 4.0–10.5)
nRBC: 0 % (ref 0.0–0.2)

## 2022-03-27 LAB — BASIC METABOLIC PANEL
Anion gap: 6 (ref 5–15)
BUN: 21 mg/dL (ref 8–23)
CO2: 28 mmol/L (ref 22–32)
Calcium: 9.5 mg/dL (ref 8.9–10.3)
Chloride: 110 mmol/L (ref 98–111)
Creatinine, Ser: 1.43 mg/dL — ABNORMAL HIGH (ref 0.44–1.00)
GFR, Estimated: 35 mL/min — ABNORMAL LOW (ref >60)
Glucose, Bld: 86 mg/dL (ref 70–99)
Potassium: 4.4 mmol/L (ref 3.5–5.1)
Sodium: 144 mmol/L (ref 135–145)

## 2022-03-27 LAB — MAGNESIUM: Magnesium: 2.3 mg/dL (ref 1.7–2.4)

## 2022-03-27 NOTE — Progress Notes (Signed)
PRN meds effective, pt is sleeping. Easily arousable. Trying to check VS still refusing.

## 2022-03-27 NOTE — TOC Progression Note (Signed)
Transition of Care [facility]) - Progression Note    Patient Details  Name: Nancy Suarez MRN: [id number] Date of Birth: September 21, 1931  Transition of Care [facility]) CM/SW [place], [place] Phone Number: 03/27/2022, 1:15 PM  Clinical Narrative:   CSW updated by MD that patient looks better today, will try to DC sitter. CSW faxed referral back out for SNF to review, awaiting bed offers.    Expected Discharge Plan: [place] Barriers to Discharge: Continued Medical Work up, Ship broker, Requiring sitter/restraints  Expected Discharge Plan and Services Expected Discharge Plan: [place] arrangements for the past 2 months: Single Family Home                                       Social Determinants of Health (SDOH) Interventions    Readmission Risk Interventions    12/01/2021    1:14 PM  Readmission Risk Prevention Plan  Transportation Screening Complete  PCP or Specialist Appt within 3-5 Days Complete  HRI or [place] Complete  Social Work Consult for [place] Planning/Counseling Complete  Palliative Care Screening Complete  Medication Review Press photographer) Complete

## 2022-03-27 NOTE — Progress Notes (Signed)
PROGRESS NOTE        PATIENT DETAILS Name: Nancy Suarez Age: 88 y.o. Sex: female Date of Birth: Jan 22, 1932 Admit Date: 03/12/2022 Admitting Physician Kayleen Memos, DO [id number], Iona Beard, MD  Brief Summary: Patient is a 88 y.o.  female with prior history of SAH, CKD stage IIIa-who presented following a fall-was found to have SDH, AKI.  Patient was admitted to [facility] service-neurosurgery recommended not surgical management-AKI was managed with supportive care-supportive care, further hospital course was complicated by confusion, suicidal ideation requiring psychiatric consultation, and new onset seizure.  See below for further details  Significant events: 9/10>> admit-fall-small SDH. 9/22>> seizure-like activity after working with physical therapy.  Significant studies: 9/10>> CT head: Small TH-posterior falx. 9/11>> CT head: SDH-unchanged. 9/16>> CT chest: Right upper lobe nodule-1.9 cm. 9/22>> CT head: Resolution of previously seen small SDH. 9/22>> Spot EEG: No seizures.   Significant microbiology data: None  Procedures: None  Consults: Neuro surgery, neurology, psychiatry  Subjective:  Patient in bed, appears comfortable, denies any headache, no fever, no chest pain or pressure, no shortness of breath , no abdominal pain. No new focal weakness.  Objective: Vitals: Blood pressure (!) 128/57, pulse 76, temperature 98.5 F (36.9 C), temperature source Oral, resp. rate 19, height 5\' 5"  (1.651 m), weight 60.9 kg, SpO2 100 %.   Exam:  Awake Alert, No new F.N deficits, Normal affect [place].AT,PERRAL Supple Neck, No JVD,   Symmetrical Chest wall movement, Good air movement bilaterally, CTAB RRR,No Gallops, Rubs or new Murmurs,  +ve B.Sounds, Abd Soft, No tenderness,   No Cyanosis, Clubbing or edema    Assessment/Plan:  Recurrent mechanical fall Due to frailty/advanced age-possibly medication effect-evaluated by PT/OT-recommendations are for  SNF.  Acute metabolic encephalopathy - Delirium superimposed on possible undiagnosed dementia - Encephalopathy felt to be due to AKI-seems to have improved as answering simple questions/following commands.  Continue to maintain delirium precautions.  Continue Depakote-dosage escalated since she had seizure on 9/22.  Have adjusted Seroquel and Haldol doses for better control of delirium.  SDH - Nonsurgical management recommended by neurosurgery.  New onset seizure - Appreciate neurology input-Depakote dosage escalated-no further seizures.  Suicidal ideation - This was in the setting of delirium/encephalopathy-evaluated by psychiatry-inpatient psychiatric admission not recommended-stable now.  History of depression - Continue Lexapro  AKI on CKD stage IIIa AKI hemodynamically mediated-improved-close to baseline.  HLD Continue statin  Right upper lobe lung nodule Incidental finding-repeat CT chest in 3 to 6 months or PET/CT as an outpatient.   BMI: Estimated body mass index is 22.34 kg/m as calculated from the following:   Height as of this encounter: 5\' 5"  (1.651 m).   Weight as of this encounter: 60.9 kg.   Code status:   Code Status: Full Code   DVT Prophylaxis: Place and maintain sequential compression device Start: 03/26/22 1008 SCDs Start: 03/13/22 2337   Family Communication: Daughter-[name]-left VM 9/23   Disposition Plan: Status is: Inpatient Remains inpatient appropriate because: Resolving encephalopathy-new onset seizures-not yet stable for discharge.   Planned Discharge Destination:Skilled nursing facility   Diet: Diet Order             DIET SOFT Room service appropriate? Yes; Fluid consistency: Thin  Diet effective now                   MEDICATIONS: Scheduled Meds:  acetaminophen  1,000 mg Oral BID   atorvastatin  20 mg Oral Daily   divalproex  250 mg Oral Q8H   escitalopram  10 mg Oral Daily   famotidine  10 mg Oral Daily    melatonin  3 mg Oral QHS   multivitamin with minerals  1 tablet Oral Daily   polyvinyl alcohol  1 drop Both Eyes TID   QUEtiapine  50 mg Oral QHS   sodium chloride flush  3 mL Intravenous Q12H   Continuous Infusions: PRN Meds:.acetaminophen **OR** acetaminophen, docusate sodium, guaiFENesin-dextromethorphan, haloperidol lactate **OR** [DISCONTINUED] risperiDONE, hydrALAZINE, sodium chloride flush   I have personally reviewed following labs and imaging studies  LABORATORY DATA:  Recent Labs  Lab 03/21/22 1342 03/24/22 1230 03/25/22 1059 03/27/22 0751  WBC 5.8 6.9 6.3 4.5  HGB 10.6* 9.9* 9.4* 10.2*  HCT 33.2* 31.3* 30.4* 32.0*  PLT 239 216 201 200  MCV 92.7 94.3 95.9 94.1  MCH 29.6 29.8 29.7 30.0  MCHC 31.9 31.6 30.9 31.9  RDW 14.7 14.6 14.8 14.5  LYMPHSABS  --   --   --  1.1  MONOABS  --   --   --  0.5  EOSABS  --   --   --  0.1  BASOSABS  --   --   --  0.0    Recent Labs  Lab 03/21/22 1342 03/25/22 1059 03/27/22 0751  NA 142 138 144  K 4.6 4.5 4.4  CL 106 108 110  CO2 26 25 28   GLUCOSE 106* 95 86  BUN 32* 24* 21  CREATININE 1.59* 1.53* 1.43*  CALCIUM 9.8 8.8* 9.5  AST  --  17  --   ALT  --  16  --   ALKPHOS  --  69  --   BILITOT  --  0.5  --   ALBUMIN  --  3.0*  --   MG  --   --  2.3    RADIOLOGY STUDIES/RESULTS: No results found.   LOS: 14 days   Signature  Lala Lund M.D on 03/27/2022 at 10:24 AM   -  To page go to [email or web address]

## 2022-03-27 NOTE — Progress Notes (Signed)
Mobility Specialist Progress Note   03/27/22 1620  Mobility  Activity Ambulated with assistance in hallway  Level of Assistance +2 (takes two people)  Assistive Device Front wheel walker  Distance Ambulated (ft) 160 ft  Activity Response Tolerated well  $Mobility charge 1 Mobility   Received in chair having no complaint of pain and eager for mobility. Pt presenting w/ general unsteadiness and requiring minA to prevent posterior lean, +2A for chair follow and to progress ambulation. X2 seated rest break d/t fatigue, no faults throughout session. Rolled back to room and placed in reach of call bell, NT present in room.     Holland Falling Mobility Specialist MS [facility] #:  [phone] Acute Rehab Office:  [phone]

## 2022-03-27 NOTE — Progress Notes (Addendum)
Held midnight vital signs, pt has settled down to sleep and become restless and aggravated with too many task at once. Replace tele and purewick with minor resistance. Will follow up at 0400

## 2022-03-28 DIAGNOSIS — R296 Repeated falls: Secondary | ICD-10-CM | POA: Diagnosis not present

## 2022-03-28 MED ORDER — CARVEDILOL 3.125 MG PO TABS
3.1250 mg | ORAL_TABLET | Freq: Two times a day (BID) | ORAL | Status: DC
  Administered 2022-03-28 – 2022-03-31 (×7): 3.125 mg via ORAL
  Filled 2022-03-28 (×7): qty 1

## 2022-03-28 NOTE — Progress Notes (Signed)
Occupational Therapy Treatment Patient Details Name: Nancy Suarez MRN: [id number] DOB: 1932/01/30 Today's Date: 03/28/2022   History of present illness Pt is a 88 y/o female presenting on 9/10 after fall. CT 9/10 with small subdural along posterior falx, follow up CT 9/11 with unchanged subdural and subacute L lateral frontal cortical infarct. Noted admission 8/29-8/30 with subarachnoid hemorrhage after fall. New onset seizure 9/22, EEG negative. PMH includes: HTN, scoliosis, R intertrochanteric IM nail 2023, ORIF femur 2018, L TKA 2015, dementia.   OT comments  Pt supine in bed, agreeable to OT session. Eager to get OOB today.  She voices "Am I doing any better?", "Am I still having seizures?", demonstrating improved awareness to deficits and situation.  She completes bed mobility with min assist, transfers with min assist and LB ADLs with mod assist.  Setup for grooming seated at sink, improved sequencing of task needs today.  Continue to recommend SNF.    Recommendations for follow up therapy are one component of a multi-disciplinary discharge planning process, led by the attending physician.  Recommendations may be updated based on patient status, additional functional criteria and insurance authorization.    Follow Up Recommendations  Skilled nursing-short term rehab (<3 hours/day)    Assistance Recommended at Discharge Frequent or constant Supervision/Assistance  Patient can return home with the following  A little help with walking and/or transfers;A lot of help with bathing/dressing/bathroom;Assistance with cooking/housework;Direct supervision/assist for medications management;Direct supervision/assist for financial management;Assist for transportation;Help with stairs or ramp for entrance   Equipment Recommendations  Other (comment) (defer)    Recommendations for Other Services      Precautions / Restrictions Precautions Precautions: Fall Restrictions Weight Bearing  Restrictions: No       Mobility Bed Mobility Overal bed mobility: Needs Assistance Bed Mobility: Supine to Sit     Supine to sit: Min assist, HOB elevated     General bed mobility comments: for trunk support, increased time required    Transfers Overall transfer level: Needs assistance Equipment used: Rolling walker (2 wheels) Transfers: Sit to/from Stand Sit to Stand: Min assist                 Balance Overall balance assessment: Needs assistance Sitting-balance support: No upper extremity supported, Feet supported Sitting balance-Leahy Scale: Fair     Standing balance support: Bilateral upper extremity supported, During functional activity Standing balance-Leahy Scale: Poor Standing balance comment: relies on BUE and external support, pt leaning on sink for hand hygeine.                           ADL either performed or assessed with clinical judgement   ADL Overall ADL's : Needs assistance/impaired     Grooming: Set up;Sitting Grooming Details (indicate cue type and reason): to wash face and hands, increased time             Lower Body Dressing: Sit to/from stand;Moderate assistance Lower Body Dressing Details (indicate cue type and reason): min assist to power up from EOB, relies on UE support Toilet Transfer: Minimal assistance;Ambulation;Rolling walker (2 wheels) Toilet Transfer Details (indicate cue type and reason): safety, hand placement         Functional mobility during ADLs: Minimal assistance;Rolling walker (2 wheels) General ADL Comments: pt engaged in grooming at sink seated per her preference    Extremity/Trunk Assessment              Vision  Perception     Praxis      Cognition Arousal/Alertness: Awake/alert Behavior During Therapy: WFL for tasks assessed/performed Overall Cognitive Status: History of cognitive impairments - at baseline                                 General Comments:  pt pleasant and cooperative, eager to get OOB today.  Reports "am I still having seizures?" demonstrating good awareness of seizure recently.  Improved sequencing of grooming tasks during session.        Exercises      Shoulder Instructions       General Comments      Pertinent Vitals/ Pain       Pain Assessment Pain Assessment: No/denies pain  Home Living                                          Prior Functioning/Environment              Frequency  Min 2X/week        Progress Toward Goals  OT Goals(current goals can now be found in the care plan section)  Progress towards OT goals: Progressing toward goals  Acute Rehab OT Goals Patient Stated Goal: get better OT Goal Formulation: With patient Time For Goal Achievement: 04/11/22 Potential to Achieve Goals: Good ADL Goals Pt Will Perform Grooming: with supervision;sitting Pt Will Perform Lower Body Dressing: with supervision;sit to/from stand Pt Will Transfer to Toilet: with supervision;ambulating;bedside commode Pt Will Perform Toileting - Clothing Manipulation and hygiene: with supervision;sit to/from stand Additional ADL Goal #1: Pt will use RW during functional mobility/transfers with supervision.  Plan Discharge plan remains appropriate;Frequency remains appropriate    Co-evaluation                 AM-PAC OT "6 Clicks" Daily Activity     Outcome Measure   Help from another person eating meals?: A Little Help from another person taking care of personal grooming?: A Little Help from another person toileting, which includes using toliet, bedpan, or urinal?: A Lot Help from another person bathing (including washing, rinsing, drying)?: A Lot Help from another person to put on and taking off regular upper body clothing?: A Little Help from another person to put on and taking off regular lower body clothing?: A Lot 6 Click Score: 15    End of Session Equipment Utilized During  Treatment: Rolling walker (2 wheels)  OT Visit Diagnosis: Unsteadiness on feet (R26.81);Muscle weakness (generalized) (M62.81);Other symptoms and signs involving cognitive function   Activity Tolerance Patient tolerated treatment well   Patient Left in chair;with call bell/phone within reach;with chair alarm set   Nurse Communication Mobility status        Time: 0272-5366 OT Time Calculation (min): 23 min  Charges: OT General Charges $OT Visit: 1 Visit OT Treatments $Self Care/Home Management : 23-37 mins  [place] Office [phone]'   Delight Stare 03/28/2022, 1:53 PM

## 2022-03-28 NOTE — Progress Notes (Signed)
PROGRESS NOTE        PATIENT DETAILS Name: Nancy Suarez Age: 88 y.o. Sex: female Date of Birth: [birth year] Admit Date: 03/12/2022 Admitting Physician Kayleen Memos, DO SAY:[id number], Iona Beard, MD  Brief Summary: Patient is a 88 y.o.  female with prior history of SAH, CKD stage IIIa-who presented following a fall-was found to have SDH, AKI.  Patient was admitted to [facility] service-neurosurgery recommended not surgical management-AKI was managed with supportive care-supportive care, further hospital course was complicated by confusion, suicidal ideation requiring psychiatric consultation, and new onset seizure.  See below for further details  Significant events: 9/10>> admit-fall-small SDH. 9/22>> seizure-like activity after working with physical therapy.  Significant studies: 9/10>> CT head: Small TH-posterior falx. 9/11>> CT head: SDH-unchanged. 9/16>> CT chest: Right upper lobe nodule-1.9 cm. 9/22>> CT head: Resolution of previously seen small SDH. 9/22>> Spot EEG: No seizures.   Significant microbiology data: None  Procedures: None  Consults: Neuro surgery, neurology, psychiatry  Subjective:  Patient in bed, appears comfortable, denies any headache, no fever, no chest pain or pressure, no shortness of breath , no abdominal pain. No new focal weakness.   Objective: Vitals: Blood pressure (!) 160/63, pulse 60, temperature 98 F (36.7 C), temperature source Oral, resp. rate 18, height 5\' 5"  (1.651 m), weight 60.9 kg, SpO2 100 %.   Exam:  Awake, No new F.N deficits, Normal affect [place].AT,PERRAL Supple Neck, No JVD,   Symmetrical Chest wall movement, Good air movement bilaterally, CTAB RRR,No Gallops, Rubs or new Murmurs,  +ve B.Sounds, Abd Soft, No tenderness,   No Cyanosis, Clubbing or edema     Assessment/Plan:  Recurrent mechanical fall - Due to frailty/advanced age-possibly medication effect-evaluated by PT/OT-recommendations are for  SNF.  Acute metabolic encephalopathy - Delirium superimposed on possible undiagnosed dementia - Encephalopathy felt to be due to AKI-seems to have improved as answering simple questions/following commands.  Continue to maintain delirium precautions.  Continue Depakote-dosage escalated since she had seizure on 9/22.  Have adjusted Seroquel and Haldol doses for better control of delirium.  SDH - Nonsurgical management recommended by neurosurgery.  New onset seizure - Appreciate neurology input-Depakote dosage escalated-no further seizures.  Suicidal ideation - This was in the setting of delirium/encephalopathy-evaluated by psychiatry-inpatient psychiatric admission not recommended-stable now.  History of depression - Continue Lexapro  AKI on CKD stage IIIa - AKI hemodynamically mediated-improved-close to baseline.  HLD - Continue statin  Right upper lobe lung nodule - Incidental finding-repeat CT chest in 3 to 6 months or PET/CT as an outpatient.  HTN - start Coreg   BMI: Estimated body mass index is 22.34 kg/m as calculated from the following:   Height as of this encounter: 5\' 5"  (1.651 m).   Weight as of this encounter: 60.9 kg.   Code status:   Code Status: Full Code   DVT Prophylaxis: Place and maintain sequential compression device Start: 03/26/22 1008 SCDs Start: 03/13/22 2337   Family Communication: Daughter-[name]-left VM 9/23   Disposition Plan: Status is: Inpatient Remains inpatient appropriate because: Resolving encephalopathy-new onset seizures-not yet stable for discharge.   Planned Discharge Destination:Skilled nursing facility   Diet: Diet Order             DIET SOFT Room service appropriate? Yes; Fluid consistency: Thin  Diet effective now  MEDICATIONS: Scheduled Meds:  acetaminophen  1,000 mg Oral BID   atorvastatin  20 mg Oral Daily   divalproex  250 mg Oral Q8H   escitalopram  10 mg Oral Daily   famotidine  10  mg Oral Daily   melatonin  3 mg Oral QHS   multivitamin with minerals  1 tablet Oral Daily   polyvinyl alcohol  1 drop Both Eyes TID   QUEtiapine  50 mg Oral QHS   sodium chloride flush  3 mL Intravenous Q12H   Continuous Infusions: PRN Meds:.acetaminophen **OR** acetaminophen, docusate sodium, guaiFENesin-dextromethorphan, haloperidol lactate **OR** [DISCONTINUED] risperiDONE, hydrALAZINE, sodium chloride flush   I have personally reviewed following labs and imaging studies  LABORATORY DATA:  Recent Labs  Lab 03/21/22 1342 03/24/22 1230 03/25/22 1059 03/27/22 0751  WBC 5.8 6.9 6.3 4.5  HGB 10.6* 9.9* 9.4* 10.2*  HCT 33.2* 31.3* 30.4* 32.0*  PLT 239 216 201 200  MCV 92.7 94.3 95.9 94.1  MCH 29.6 29.8 29.7 30.0  MCHC 31.9 31.6 30.9 31.9  RDW 14.7 14.6 14.8 14.5  LYMPHSABS  --   --   --  1.1  MONOABS  --   --   --  0.5  EOSABS  --   --   --  0.1  BASOSABS  --   --   --  0.0    Recent Labs  Lab 03/21/22 1342 03/25/22 1059 03/27/22 0751  NA 142 138 144  K 4.6 4.5 4.4  CL 106 108 110  CO2 26 25 28   GLUCOSE 106* 95 86  BUN 32* 24* 21  CREATININE 1.59* 1.53* 1.43*  CALCIUM 9.8 8.8* 9.5  AST  --  17  --   ALT  --  16  --   ALKPHOS  --  69  --   BILITOT  --  0.5  --   ALBUMIN  --  3.0*  --   MG  --   --  2.3    RADIOLOGY STUDIES/RESULTS: No results found.   LOS: 15 days   Signature  Lala Lund M.D on 03/28/2022 at 10:27 AM   -  To page go to [email or web address]

## 2022-03-29 DIAGNOSIS — R296 Repeated falls: Secondary | ICD-10-CM | POA: Diagnosis not present

## 2022-03-29 MED ORDER — QUETIAPINE FUMARATE 100 MG PO TABS
100.0000 mg | ORAL_TABLET | Freq: Every day | ORAL | Status: DC
  Administered 2022-03-29: 100 mg via ORAL
  Filled 2022-03-29: qty 1

## 2022-03-29 NOTE — Progress Notes (Signed)
Pt extremely agitated earlier, cussing out staff and trying to get out of bed.  Attempted to redirect, but patient screaming profanities and insults at staff.  Pt refusing tele and refusing midnight vitals.  Reached out to Dr. Cyd Silence, but no response back.  Patient left in room with bed alarm on.  Seems to be settling down with little interaction and disturbance from staff.  Will continue to monitor.  Unable to administer PRN haldol due to patient not wearing tele.

## 2022-03-29 NOTE — Progress Notes (Signed)
Mobility Specialist: Progress Note   03/29/22 1730  Mobility  Activity Ambulated with assistance in hallway  Level of Assistance Minimal assist, patient does 75% or more  Assistive Device Front wheel walker  Distance Ambulated (ft) 60 ft  Activity Response Tolerated well  $Mobility charge 1 Mobility   Pt received in the chair and agreeable to mobility. C/o BLE pain during ambulation, no rating given. Pt back to the chair after session with call bell at her side. NT present in the room.   [facility] Sarea Fyfe Mobility Specialist Mobility Specialist [address]: [phone]

## 2022-03-29 NOTE — Progress Notes (Signed)
HOSPITAL MEDICINE OVERNIGHT EVENT NOTE    Notified by nursing that patient has become increasingly agitated throughout the evening shift.  Patient is frequently attempting to get out of bed.  When staff interacts with her she is frequently yelling at them and not allowing staff to obtain vital signs from her.  Nursing has reportedly been rounding on the patient.  Patient has pulled off her telemetry leads and is not allowing staff to put them back on.  Due to the fact that the patient has not had a ECG since 9/13 and the fact that the patient is refusing telemetry as needed Haldol has not been administered.  We will order a TeleSitter to assist nursing with close monitoring the patient and minimize risk of falling out of bed.  Attempting to minimize uncomfortable stimuli is much as possible.  Will reattempt placing patient back on on telemetry later in the morning.  Vernelle Emerald  MD [facility]

## 2022-03-29 NOTE — Progress Notes (Signed)
PROGRESS NOTE        PATIENT DETAILS Name: Nancy Suarez Age: 88 y.o. Sex: female Date of Birth: 1932/03/01 Admit Date: 03/12/2022 Admitting Physician Kayleen Memos, DO RKY:[id number], Iona Beard, MD  Brief Summary: Patient is a 88 y.o.  female with prior history of SAH, CKD stage IIIa-who presented following a fall-was found to have SDH, AKI.  Patient was admitted to [facility] service-neurosurgery recommended not surgical management-AKI was managed with supportive care-supportive care, further hospital course was complicated by confusion, suicidal ideation requiring psychiatric consultation, and new onset seizure.  See below for further details  Significant events: 9/10>> admit-fall-small SDH. 9/22>> seizure-like activity after working with physical therapy.  Significant studies: 9/10>> CT head: Small TH-posterior falx. 9/11>> CT head: SDH-unchanged. 9/16>> CT chest: Right upper lobe nodule-1.9 cm. 9/22>> CT head: Resolution of previously seen small SDH. 9/22>> Spot EEG: No seizures.   Significant microbiology data: None  Procedures: None  Consults: Neuro surgery, neurology, psychiatry  Subjective: Patient in bed, appears comfortable, denies any headache, no fever, no chest pain or pressure, no shortness of breath , no abdominal pain. No focal weakness.   Objective: Vitals: Blood pressure 139/67, pulse 71, temperature 97.8 F (36.6 C), temperature source Oral, resp. rate 18, height 5\' 5"  (1.651 m), weight 60.9 kg, SpO2 100 %.   Exam:  Awake, pleasantly confused oriented x1,, No new F.N deficits, Normal affect [place].AT,PERRAL Supple Neck, No JVD,   Symmetrical Chest wall movement, Good air movement bilaterally, CTAB RRR,No Gallops, Rubs or new Murmurs,  +ve B.Sounds, Abd Soft, No tenderness,   No Cyanosis, Clubbing or edema     Assessment/Plan:  Recurrent mechanical fall - Due to frailty/advanced age-possibly medication effect-evaluated by  PT/OT-recommendations are for SNF.  Acute metabolic encephalopathy - Delirium superimposed on possible undiagnosed dementia - Encephalopathy felt to be due to AKI-seems to have improved as answering simple questions/following commands.  Continue to maintain delirium precautions.  Continue Depakote-dosage escalated since she had seizure on 9/22.  Have adjusted Seroquel and Haldol doses for better control of delirium on 03/29/2022.  She is very well controlled in the daytime nighttime intermittently she is getting little confused.  This might continue while she is in unfamiliar setting like hospital.  SDH - Nonsurgical management recommended by neurosurgery.  New onset seizure - Appreciate neurology input-Depakote dosage escalated-no further seizures.  Suicidal ideation - This was in the setting of delirium/encephalopathy-evaluated by psychiatry-inpatient psychiatric admission not recommended-stable now.  History of depression - Continue Lexapro  AKI on CKD stage IIIa - AKI hemodynamically mediated-improved-close to baseline.  HLD - Continue statin  Right upper lobe lung nodule - Incidental finding-repeat CT chest in 3 to 6 months or PET/CT as an outpatient.  HTN - start Coreg   BMI: Estimated body mass index is 22.34 kg/m as calculated from the following:   Height as of this encounter: 5\' 5"  (1.651 m).   Weight as of this encounter: 60.9 kg.   Code status:   Code Status: Full Code   DVT Prophylaxis: Place and maintain sequential compression device Start: 03/26/22 1008 SCDs Start: 03/13/22 2337   Family Communication: Daughter-[name]-[phone]-on 03/29/2022 at 10:24 AM.  Mailbox full.   Disposition Plan: Status is: Inpatient Remains inpatient appropriate because: Resolving encephalopathy-new onset seizures-not yet stable for discharge.   Planned Discharge Destination:Skilled nursing facility   Diet: Diet Order  DIET SOFT Room service appropriate? Yes; Fluid  consistency: Thin  Diet effective now                   MEDICATIONS: Scheduled Meds:  acetaminophen  1,000 mg Oral BID   atorvastatin  20 mg Oral Daily   carvedilol  3.125 mg Oral BID WC   divalproex  250 mg Oral Q8H   escitalopram  10 mg Oral Daily   famotidine  10 mg Oral Daily   melatonin  3 mg Oral QHS   multivitamin with minerals  1 tablet Oral Daily   polyvinyl alcohol  1 drop Both Eyes TID   QUEtiapine  100 mg Oral QHS   sodium chloride flush  3 mL Intravenous Q12H   Continuous Infusions: PRN Meds:.acetaminophen **OR** acetaminophen, docusate sodium, guaiFENesin-dextromethorphan, haloperidol lactate **OR** [DISCONTINUED] risperiDONE, hydrALAZINE, sodium chloride flush   I have personally reviewed following labs and imaging studies  LABORATORY DATA:  Recent Labs  Lab 03/24/22 1230 03/25/22 1059 03/27/22 0751  WBC 6.9 6.3 4.5  HGB 9.9* 9.4* 10.2*  HCT 31.3* 30.4* 32.0*  PLT 216 201 200  MCV 94.3 95.9 94.1  MCH 29.8 29.7 30.0  MCHC 31.6 30.9 31.9  RDW 14.6 14.8 14.5  LYMPHSABS  --   --  1.1  MONOABS  --   --  0.5  EOSABS  --   --  0.1  BASOSABS  --   --  0.0    Recent Labs  Lab 03/25/22 1059 03/27/22 0751  NA 138 144  K 4.5 4.4  CL 108 110  CO2 25 28  GLUCOSE 95 86  BUN 24* 21  CREATININE 1.53* 1.43*  CALCIUM 8.8* 9.5  AST 17  --   ALT 16  --   ALKPHOS 69  --   BILITOT 0.5  --   ALBUMIN 3.0*  --   MG  --  2.3    RADIOLOGY STUDIES/RESULTS: No results found.   LOS: 16 days   Signature  Lala Lund M.D on 03/29/2022 at 10:25 AM   -  To page go to [email or web address]

## 2022-03-30 DIAGNOSIS — R296 Repeated falls: Secondary | ICD-10-CM | POA: Diagnosis not present

## 2022-03-30 LAB — CBC
HCT: 31.9 % — ABNORMAL LOW (ref 36.0–46.0)
Hemoglobin: 10.2 g/dL — ABNORMAL LOW (ref 12.0–15.0)
MCH: 30.1 pg (ref 26.0–34.0)
MCHC: 32 g/dL (ref 30.0–36.0)
MCV: 94.1 fL (ref 80.0–100.0)
Platelets: 197 10*3/uL (ref 150–400)
RBC: 3.39 MIL/uL — ABNORMAL LOW (ref 3.87–5.11)
RDW: 14.3 % (ref 11.5–15.5)
WBC: 4.4 10*3/uL (ref 4.0–10.5)
nRBC: 0 % (ref 0.0–0.2)

## 2022-03-30 LAB — MAGNESIUM: Magnesium: 2.2 mg/dL (ref 1.7–2.4)

## 2022-03-30 LAB — BASIC METABOLIC PANEL
Anion gap: 9 (ref 5–15)
BUN: 27 mg/dL — ABNORMAL HIGH (ref 8–23)
CO2: 28 mmol/L (ref 22–32)
Calcium: 9.3 mg/dL (ref 8.9–10.3)
Chloride: 106 mmol/L (ref 98–111)
Creatinine, Ser: 1.38 mg/dL — ABNORMAL HIGH (ref 0.44–1.00)
GFR, Estimated: 37 mL/min — ABNORMAL LOW (ref >60)
Glucose, Bld: 81 mg/dL (ref 70–99)
Potassium: 4.4 mmol/L (ref 3.5–5.1)
Sodium: 143 mmol/L (ref 135–145)

## 2022-03-30 MED ORDER — QUETIAPINE FUMARATE 100 MG PO TABS: 100.0000 mg | ORAL_TABLET | Freq: Every day | ORAL | Status: DC

## 2022-03-30 MED ORDER — CARVEDILOL 3.125 MG PO TABS: 3.1250 mg | ORAL_TABLET | Freq: Two times a day (BID) | ORAL | Status: DC

## 2022-03-30 MED ORDER — QUETIAPINE FUMARATE 100 MG PO TABS
100.0000 mg | ORAL_TABLET | Freq: Every day | ORAL | Status: DC
  Administered 2022-03-30: 100 mg via ORAL
  Filled 2022-03-30: qty 1

## 2022-03-30 MED ORDER — DIVALPROEX SODIUM 125 MG PO CSDR: 250.0000 mg | DELAYED_RELEASE_CAPSULE | Freq: Three times a day (TID) | ORAL | Status: DC

## 2022-03-30 NOTE — Discharge Summary (Addendum)
Nancy Suarez [id number] DOB: 13-Oct-1931 DOA: 03/12/2022  PCP: Vincente Liberty, MD  Admit date: 03/12/2022  Discharge date: 03/31/2022  Admitted From: Home   Disposition:  SNF   Recommendations for Outpatient Follow-up:   Follow up with PCP in 1-2 weeks  PCP Please obtain BMP/CBC, 2 view CXR in 1week,  (see Discharge instructions)   PCP Please follow up on the following pending results:    Home Health: None Equipment/Devices: None  Consultations: Neurosurgery, neurology, psychiatry Discharge Condition: Fair   CODE STATUS: Full    Diet Recommendation: Soft diet with full feeding assistance and aspiration precautions.  Diet Order             DIET SOFT Room service appropriate? Yes; Fluid consistency: Thin  Diet effective now                    Chief Complaint  Patient presents with   Dysuria     Brief history of present illness from the day of admission and additional interim summary    88 y.o.  female with prior history of SAH, CKD stage IIIa-who presented following a fall-was found to have SDH, AKI.  Patient was admitted to [facility] service-neurosurgery recommended not surgical management-AKI was managed with supportive care-supportive care, further hospital course was complicated by confusion, suicidal ideation requiring psychiatric consultation, and new onset seizure.  See below for further details   Significant events: 9/10>> admit-fall-small SDH. 9/22>> seizure-like activity after working with physical therapy.   Significant studies: 9/10>> CT head: Small TH-posterior falx. 9/11>> CT head: SDH-unchanged. 9/16>> CT chest: Right upper lobe nodule-1.9 cm. 9/22>> CT head: Resolution of previously seen small SDH. 9/22>> Spot EEG: No seizures.                                                                  Hospital Course     Recurrent mechanical fall - Due to frailty/advanced age-possibly medication effect-evaluated by PT/OT-recommendations are for SNF.   Acute metabolic encephalopathy - Delirium superimposed on possible undiagnosed dementia - Encephalopathy felt to be due to [place] setting, with Depakote and Seroquel she is much improved.  Mild intermittent nighttime confusion episodes, monitor close supervision at SNF.  Remains a fall risk.   SDH - Nonsurgical management recommended by neurosurgery.   New onset seizure - Appreciate neurology input-Depakote dosage escalated-no further seizures.   Suicidal ideation with some intermittent encephalopathy- This was in the setting of delirium/encephalopathy-evaluated by psychiatry-inpatient psychiatric admission not recommended-stable now, continue Depakote.   History of depression - Continue Lexapro   AKI on CKD stage IIIa - AKI hemodynamically mediated-improved-close to baseline.   HLD - Continue statin   Right upper lobe lung nodule - Incidental finding-repeat CT chest in 3 to 6 months or PET/CT as an  outpatient.   HTN - started on Coreg  Discharge diagnosis     Principal Problem:   Frequent falls Active Problems:   GERD (gastroesophageal reflux disease)   HLD (hyperlipidemia)   Chronic iron deficiency anemia   Acute renal failure superimposed on stage 3a chronic kidney disease (HCC)   Acute encephalopathy   Allergic rhinitis   Subdural hematoma (HCC)   Suicidal ideation   Generalized weakness    Discharge instructions    Discharge Instructions     Discharge instructions   Complete by: As directed    Follow with Primary MD Vincente Liberty, MD in 7 days   Get CBC, CMP, Magnesium, 2 view Chest X ray -  checked next visit within 1 week by SNF MD    Activity: As tolerated with Full fall precautions use walker/cane & assistance as needed  Disposition SNF  Diet: Soft diet with feeding  assistance and aspiration precautions.  Special Instructions: If you have smoked or chewed Tobacco  in the last 2 yrs please stop smoking, stop any regular Alcohol  and or any Recreational drug use.  On your next visit with your primary care physician please Get Medicines reviewed and adjusted.  Please request your Prim.MD to go over all Hospital Tests and Procedure/Radiological results at the follow up, please get all Hospital records sent to your Prim MD by signing hospital release before you go home.  If you experience worsening of your admission symptoms, develop shortness of breath, life threatening emergency, suicidal or homicidal thoughts you must seek medical attention immediately by calling 911 or calling your MD immediately  if symptoms less severe.  You Must read complete instructions/literature along with all the possible adverse reactions/side effects for all the Medicines you take and that have been prescribed to you. Take any new Medicines after you have completely understood and accpet all the possible adverse reactions/side effects.   Increase activity slowly   Complete by: As directed        Discharge Medications   Allergies as of 03/31/2022       Reactions   Ambien [zolpidem] Other (See Comments)   Disorientation Aggression    Aspirin Other (See Comments)   Upsets stomach if 325mg    Codeine Nausea And Vomiting   Namenda [memantine] Other (See Comments)   Agitation Aggression    Percocet [oxycodone-acetaminophen] Other (See Comments)   Disorientation Aggression     Sulfa Antibiotics Nausea Only        Medication List     TAKE these medications    acetaminophen 500 MG tablet Commonly known as: TYLENOL Take 2 tablets (1,000 mg total) by mouth 2 (two) times daily. What changed:  when to take this additional instructions   ALLEGRA ALLERGY PO Take 1 tablet by mouth daily.   atorvastatin 20 MG tablet Commonly known as: LIPITOR Take 20 mg by mouth  daily.   BIOFREEZE EX Apply 1 application  topically 2 (two) times daily as needed (knee pain).   carboxymethylcellulose 0.5 % Soln Commonly known as: REFRESH PLUS Place 1 drop into both eyes in the morning, at noon, and at bedtime.   carvedilol 3.125 MG tablet Commonly known as: COREG Take 1 tablet (3.125 mg total) by mouth 2 (two) times daily with a meal.   divalproex 125 MG capsule Commonly known as: DEPAKOTE SPRINKLE Take 2 capsules (250 mg total) by mouth every 8 (eight) hours.   docusate sodium 100 MG capsule Commonly known as: COLACE Take 100 mg by  mouth daily as needed for mild constipation.   escitalopram 10 MG tablet Commonly known as: LEXAPRO Take 10 mg by mouth daily.   famotidine 20 MG tablet Commonly known as: PEPCID Take 20 mg by mouth 2 (two) times daily.   fluticasone 50 MCG/ACT nasal spray Commonly known as: FLONASE Place 1-2 sprays into both nostrils daily as needed for allergies.   MELATONIN PO Take 2 tablets by mouth at bedtime.   mirtazapine 30 MG tablet Commonly known as: REMERON Take 1 tablet (30 mg total) by mouth at bedtime. What changed:  when to take this additional instructions   Multivitamin Adult Chew Chew 1 tablet by mouth daily.   QUEtiapine 100 MG tablet Commonly known as: SEROQUEL Take 1 tablet (100 mg total) by mouth at bedtime.         Follow-up Information     Vincente Liberty, MD. Schedule an appointment as soon as possible for a visit in 1 week(s).   Specialty: Pulmonary Disease Contact information: [place] [address] [phone]                 Major procedures and Radiology Reports - PLEASE review detailed and final reports thoroughly  -      CT HEAD WO CONTRAST (5MM)  Result Date: 03/24/2022 CLINICAL DATA:  Initial evaluation for acute seizure. EXAM: CT HEAD WITHOUT CONTRAST TECHNIQUE: Contiguous axial images were obtained from the base of the skull through the vertex  without intravenous contrast. RADIATION DOSE REDUCTION: This exam was performed according to the departmental dose-optimization program which includes automated exposure control, adjustment of the mA and/or kV according to patient size and/or use of iterative reconstruction technique. COMPARISON:  Prior CT from 03/13/2022. FINDINGS: Brain: Generalized age-related cerebral atrophy with chronic small vessel ischemic disease. Previously seen small subdural hematoma has resolved. No new intracranial hemorrhage. Continued interval evolution of subacute ischemic infarct involving the anterior left frontal lobe. No associated hemorrhage or mass effect. No acute large vessel territory infarct. No mass lesion or midline shift. No hydrocephalus or extra-axial fluid collection. Vascular: No hyperdense vessel. Scattered vascular calcifications noted within the carotid siphons. Skull: Resolving left periorbital contusion/hematoma. Scalp soft tissues demonstrate no new finding. Calvarium intact. Sinuses/Orbits: Globes orbital soft tissues demonstrate no acute finding. Paranasal sinuses and mastoid air cells are largely clear. Other: None. IMPRESSION: 1. Interval resolution of previously seen small subdural hematoma. No new intracranial hemorrhage. 2. Continued normal expected interval evolution of subacute anterior left frontal infarct. No other new acute intracranial abnormality. 3. Underlying atrophy with chronic small vessel ischemic disease. 4. Resolving left periorbital contusion/hematoma. Electronically Signed   By: Jeannine Boga M.D.   On: 03/24/2022 23:12   EEG adult  Result Date: 03/24/2022 Lora Havens, MD     03/24/2022  4:50 PM Patient Name: EMPERATRIZ MEDWID MRN: [id number] Epilepsy Attending: Lora Havens Referring Physician/Provider: Oswald Hillock, MD Date: 03/24/2022 Duration: 23.15 mins Patient history: 88 year old female with seizure-like activity and altered mental status.  EEG to evaluate for  seizure. Level of alertness: Awake AEDs during EEG study: Ativan, Keppra Technical aspects: This EEG study was done with scalp electrodes positioned according to the 10-20 International system of electrode placement. Electrical activity was reviewed with band pass filter of 1-70Hz , sensitivity of 7 uV/mm, display speed of 14mm/sec with a 60Hz  notched filter applied as appropriate. EEG data were recorded continuously and digitally stored.  Video monitoring was available and reviewed as appropriate. Description: The posterior  dominant rhythm consists of 8 Hz activity of moderate voltage (25-35 uV) seen predominantly in posterior head regions, symmetric and reactive to eye opening and eye closing. Hyperventilation and photic stimulation were not performed.   IMPRESSION: This study is within normal limits. No seizures or epileptiform discharges were seen throughout the recording. A normal interictal EEG does not exclude nor support the diagnosis of epilepsy. If suspicion for interictal activity remains a concern, a prolonged study can be considered. Priyanka Barbra Sarks   DG CHEST PORT 1 VIEW  Result Date: 03/24/2022 CLINICAL DATA:  Altered mental status EXAM: PORTABLE CHEST 1 VIEW COMPARISON:  Previous studies including the chest radiograph done on 03/14/2022 and CT done on 03/18/2022 FINDINGS: Cardiac size is within normal limits. There are no signs of pulmonary edema. There is 1.9 cm nodule in right upper lung fields. Subtle increased markings are seen in left lower lung fields. Right hemidiaphragm is elevated. IMPRESSION: There is 1.9 cm nodule in right upper lung field suggesting possible neoplastic process. Increased markings in left lower lung fields may suggest crowding of bronchovascular structures due to poor inspiration or early pneumonia. Electronically Signed   By: Elmer Picker M.D.   On: 03/24/2022 13:05   CT CHEST WO CONTRAST  Result Date: 03/18/2022 CLINICAL DATA:  Lung nodule follow-up. EXAM:  CT CHEST WITHOUT CONTRAST TECHNIQUE: Multidetector CT imaging of the chest was performed following the standard protocol without IV contrast. RADIATION DOSE REDUCTION: This exam was performed according to the departmental dose-optimization program which includes automated exposure control, adjustment of the mA and/or kV according to patient size and/or use of iterative reconstruction technique. COMPARISON:  Chest radiograph, 03/14/2022. FINDINGS: Cardiovascular: Heart is normal in size. Three-vessel coronary artery calcifications. No pericardial effusion. Aorta normal in caliber. Mild aortic atherosclerotic calcifications. Mediastinum/Nodes: Thyroid gland is enlarged with poorly defined margins, extending to the level of the medial clavicle heads. No defined nodule, but gland is heterogeneous. No mediastinal or hilar masses or enlarged lymph nodes. Trachea and esophagus are unremarkable. Lungs/Pleura: Right upper lobe pulmonary nodule, 2.1 x 1.6 x 1.6 cm. This corresponds to the nodule noted on the recent chest radiograph. No other lung nodules. There is opacity in the lung bases, mostly in the lower lobes, right greater than left, consistent with atelectasis. No convincing pneumonia. No pulmonary edema. No pleural effusion or pneumothorax. Upper Abdomen: No acute findings.  Aortic atherosclerosis. Musculoskeletal: No fracture or acute finding. No osteoblastic or osteolytic lesions. Advanced arthropathic changes of the glenohumeral joints. IMPRESSION: 1. Right upper lobe nodule, mean size 1.9 cm. Consider one of the following in 3 months for both low-risk and high-risk individuals: (a) repeat chest CT, (b) follow-up PET-CT, or (c) tissue sampling. This recommendation follows the consensus statement: Guidelines for Management of Incidental Pulmonary Nodules Detected on CT Images: From the Fleischner Society 2017; Radiology 2017; 284:228-243. 2. No other lung nodules. 3. Dependent lung opacities, right greater than  left, consistent with atelectasis. No convincing pneumonia. No pulmonary edema. 4. Enlarged heterogeneous thyroid. Recommend thyroid ultrasound (ref: J Am Coll Radiol. 2015 Feb;12(2): 143-50). 5. Aortic atherosclerosis. Aortic Atherosclerosis (ICD10-I70.0). Electronically Signed   By: Lajean Manes M.D.   On: 03/18/2022 11:33   DG Chest Port 1 View  Result Date: 03/14/2022 CLINICAL DATA:  Generalized weakness. EXAM: PORTABLE CHEST 1 VIEW COMPARISON:  02/02/2022 FINDINGS: Stable marked eventration of the right hemidiaphragm with overlying vascular crowding and atelectasis. The heart is within normal limits in size. Stable tortuosity and calcification of the thoracic  aorta. Stable thyromegaly. Right upper lobe pulmonary nodule. No infiltrates, edema or effusions. The bony thorax is grossly intact. Stable scoliosis and degenerative changes involving the spine. Stable advanced bilateral shoulder joint degenerative changes. IMPRESSION: 1. 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 2. Persistent right upper lobe pulmonary lesion. 3. No acute pulmonary findings. Electronically Signed   By: Marijo Sanes M.D.   On: 03/14/2022 08:34   CT Head Wo Contrast  Result Date: 03/13/2022 CLINICAL DATA:  Follow-up left parafalcine subdural hematoma. EXAM: CT HEAD WITHOUT CONTRAST TECHNIQUE: Contiguous axial images were obtained from the base of the skull through the vertex without intravenous contrast. RADIATION DOSE REDUCTION: This exam was performed according to the departmental dose-optimization program which includes automated exposure control, adjustment of the mA and/or kV according to patient size and/or use of iterative reconstruction technique. COMPARISON:  Head CT yesterday at 8:18 p.m., MRI brain 03/04/2022, head CT 02/28/2022. FINDINGS: Current study images completed 03/13/2022 at 4:03 a.m. Brain: Again noted is a small 3 mm in thickness hyperdense  posterior right parafalcine subdural bleed, unchanged. Additional small subdural bleed extends over the left leaf of the tentorium measuring 2 mm thickness. No new or increased hemorrhage is seen. Unchanged findings of mild atrophy, moderately developed small-vessel disease and mild atrophic ventriculomegaly. There is no midline shift. Left lateral frontal lobe subacute cortical infarct is again noted in the prior location of the cortical contusion noted previously. No hyperdense bleed is seen in this area today. The cerebral and cerebellar hemispheres are otherwise unremarkable. There is no abnormality in the basal cisterns. Vascular: There are calcifications of the carotid siphons but no hyperdense central vessels. Skull: Swelling in the lateral left orbitofrontal area is again noted. Negative for skull fracture or focal lesion. Sinuses/Orbits: Visualized sinuses and mastoid air cells are clear. Other: None. IMPRESSION: 1. Small posterior right parafalcine and left tentorial subdural bleed, unchanged. 2. No parenchymal hemorrhage is seen. 3. Subacute lateral left frontal cortical infarct 4. No mass effect.  No further hemorrhage. Electronically Signed   By: Telford Nab M.D.   On: 03/13/2022 04:45   CT Head Wo Contrast  Result Date: 03/12/2022 CLINICAL DATA:  Mental status change, unknown cause EXAM: CT HEAD WITHOUT CONTRAST TECHNIQUE: Contiguous axial images were obtained from the base of the skull through the vertex without intravenous contrast. RADIATION DOSE REDUCTION: This exam was performed according to the departmental dose-optimization program which includes automated exposure control, adjustment of the mA and/or kV according to patient size and/or use of iterative reconstruction technique. COMPARISON:  02/28/2022 FINDINGS: Brain: Small amount of subdural blood noted along the posterior falx. No intraparenchymal hemorrhage. No mass effect or midline shift. No hydrocephalus. There is atrophy and  chronic small vessel disease changes. Vascular: No hyperdense vessel or unexpected calcification. Skull: No acute calvarial abnormality. Sinuses/Orbits: No acute findings Other: Soft tissue swelling over the left lateral orbit and forehead. IMPRESSION: Small amount of subdural blood along the posterior falx. No mass effect or midline shift. No intraparenchymal hemorrhage. These results were called by telephone at the time of interpretation on 03/12/2022 at 9:27 pm to provider Garnette Gunner , who verbally acknowledged these results. Electronically Signed   By: Rolm Baptise M.D.   On: 03/12/2022 21:32   MR BRAIN WO CONTRAST  Result Date: 03/05/2022  [facility] [address], [place], [place] [address] [phone] NEUROIMAGING REPORT STUDY DATE: 03/04/2022 PATIENT NAME: MARYMAR VANDERPLAATS DOB: [birth year] MRN:  [id number] EXAM: MRI Brain without contrast ORDERING CLINICIAN: Frann Rider, NP CLINICAL HISTORY: 88 year old woman with dementia COMPARISON FILMS: CT 02/28/2022 and MRI 09/23/2020 TECHNIQUE: MRI of the brain without contrast was obtained utilizing 5 mm axial slices with T1, T2, T2 flair, SWI and diffusion weighted views.  T1 sagittal and T2 coronal views were obtained. CONTRAST: none IMAGING SITE: [facility], [place], [place] FINDINGS: On sagittal images, the spinal cord is imaged caudally to C5-C6 and is normal in caliber.  Mild degenerative pannus is noted at C1-C2 not causing stenosis.  Degenerative spine changes are also noted at other levels not causing significant spinal stenosis..  The cervicomedullary junction appears normal.  The pituitary gland and optic chiasm appear normal.    There is mild to moderate generalized cortical atrophy and mild corpus callosum and mild cerebellar atrophy.  There are no abnormal extra-axial collections of fluid.  There is a subacute contusion in the left frontal lobe with associated heme products and mild adjacent increased  FLAIR/T2 signal consistent with edema.  Elsewhere in the hemispheres there are scattered T2/FLAIR hyperintense foci in the subcortical and deep white matter.   Susceptibility weighted images show the heme products in the left frontal lobe consistent with the the acute hemorrhage noted on the CT scan 02/28/2022.  Additionally, there are foci of hemosiderin deposition in sulci in the right parietal lobe and left occipital lobe.  These are also observed on the 2022 MRI and appears unchanged.  As seen on the recent CT scan, there is also a scalp hematoma in the left frontal region.  Diffusion weighted images show no acute ischemic findings. There have been bilateral lens replacements.  Otherwise, the orbits appear normal.   The VIIth/VIIIth nerve complex appears normal.  The mastoid air cells appear normal.  The paranasal sinuses appear normal.  Flow voids are identified within the major intracerebral arteries.     This MRI of the brain without contrast shows the following: Heme products are noted with adjacent edema in the left frontal lobe consistent with a subacute contusion, possibly with some subarachnoid blood.  This corresponds to the finding on the CT scan from 02/28/2022.  A scalp hematoma is also noted, reduced in size compared to the CT scan. Chronic heme products are also noted in sulci of the right parietal and left occipital lobe.  These are also seen in the MRI from 09/23/2020 consistent with cortical superficial siderosis or prior subarachnoid bleed. Scattered T2/FLAIR hyperintense foci in the hemispheres consistent with mild chronic microvascular ischemic change. Mild to moderate generalized cortical atrophy.  This is similar to the MRI from 09/23/2020. INTERPRETING PHYSICIAN: Richard A. Felecia Shelling, MD, PhD, FAAN Certified in  Neuroimaging by [employer]    Micro Results    No results found for this or any previous visit (from the past 240 hour(s)).  Today   Subjective     Sham Blough today has no headache,no chest abdominal pain,no new weakness tingling or numbness, feels much better   Objective   Blood pressure (!) 132/92, pulse 70, temperature 98.7 F (37.1 C), temperature source Oral, resp. rate 18, height 5\' 5"  (1.651 m), weight 60.9 kg, SpO2 92 %.  No intake or output data in the 24 hours ending 03/31/22 0952   Exam  Awake Alert, O x 2,  No new F.N deficits,    .AT,PERRAL Supple Neck,   Symmetrical Chest wall movement, Good air movement bilaterally, CTAB RRR,No Gallops,   +ve B.Sounds, Abd  Soft, Non tender,  No Cyanosis, Clubbing or edema    Data Review   Recent Labs  Lab 03/24/22 1230 03/25/22 1059 03/27/22 0751 03/30/22 0613  WBC 6.9 6.3 4.5 4.4  HGB 9.9* 9.4* 10.2* 10.2*  HCT 31.3* 30.4* 32.0* 31.9*  PLT 216 201 200 197  MCV 94.3 95.9 94.1 94.1  MCH 29.8 29.7 30.0 30.1  MCHC 31.6 30.9 31.9 32.0  RDW 14.6 14.8 14.5 14.3  LYMPHSABS  --   --  1.1  --   MONOABS  --   --  0.5  --   EOSABS  --   --  0.1  --   BASOSABS  --   --  0.0  --     Recent Labs  Lab 03/25/22 1059 03/27/22 0751 03/30/22 0613  NA 138 144 143  K 4.5 4.4 4.4  CL 108 110 106  CO2 25 28 28   GLUCOSE 95 86 81  BUN 24* 21 27*  CREATININE 1.53* 1.43* 1.38*  CALCIUM 8.8* 9.5 9.3  AST 17  --   --   ALT 16  --   --   ALKPHOS 69  --   --   BILITOT 0.5  --   --   ALBUMIN 3.0*  --   --   MG  --  2.3 2.2    Total Time in preparing paper work, data evaluation and todays exam - 35 minutes  Lala Lund M.D on 03/31/2022 at 9:52 AM  [facility]

## 2022-03-30 NOTE — Progress Notes (Signed)
Physical Therapy Treatment Patient Details Name: Nancy Suarez MRN: [id number] DOB: Jun 04, 1932 Today's Date: 03/30/2022   History of Present Illness Pt is a 88 y/o female presenting on 9/10 after fall. CT 9/10 with small subdural along posterior falx, follow up CT 9/11 with unchanged subdural and subacute L lateral frontal cortical infarct. Noted admission 8/29-8/30 with subarachnoid hemorrhage after fall. New onset seizure 9/22, EEG negative. PMH includes: HTN, scoliosis, R intertrochanteric IM nail 2023, ORIF femur 2018, L TKA 2015, dementia.    PT Comments    Able to progress hallway distance some this session and pt reporting hopeful her new seizure mediation is effective.  Patient appropriate for STSNF level rehab at d/c.  PT will continue to follow.    Recommendations for follow up therapy are one component of a multi-disciplinary discharge planning process, led by the attending physician.  Recommendations may be updated based on patient status, additional functional criteria and insurance authorization.  Follow Up Recommendations  Skilled nursing-short term rehab (<3 hours/day) Can patient physically be transported by private vehicle: Yes   Assistance Recommended at Discharge Frequent or constant Supervision/Assistance  Patient can return home with the following A little help with walking and/or transfers;A little help with bathing/dressing/bathroom;Assistance with cooking/housework;Assist for transportation;Help with stairs or ramp for entrance   Equipment Recommendations  None recommended by PT    Recommendations for Other Services       Precautions / Restrictions Precautions Precautions: Fall     Mobility  Bed Mobility               General bed mobility comments: in recliner    Transfers Overall transfer level: Needs assistance Equipment used: Rolling walker (2 wheels) Transfers: Sit to/from Stand Sit to Stand: Min assist           General transfer  comment: up from recliner assist for balance and safety reaching for walker    Ambulation/Gait Ambulation/Gait assistance: Min assist Gait Distance (Feet): 80 Feet Assistive device: Rolling walker (2 wheels) Gait Pattern/deviations: Step-through pattern, Decreased stride length, Trunk flexed, Shuffle       General Gait Details: in hallway with RW and A for balance and safety; fatigued but made it to her goal; VSS   Stairs             Wheelchair Mobility    Modified Rankin (Stroke Patients Only)       Balance Overall balance assessment: Needs assistance Sitting-balance support: Feet supported Sitting balance-Leahy Scale: Good     Standing balance support: Bilateral upper extremity supported, Reliant on assistive device for balance Standing balance-Leahy Scale: Poor                              Cognition Arousal/Alertness: Awake/alert Behavior During Therapy: WFL for tasks assessed/performed Overall Cognitive Status: No family/caregiver present to determine baseline cognitive functioning                                 General Comments: alert and cooperative, not aware she was soiled with urine till we stood, reports on new medication for seizures ("spells") she hopes makes it better        Exercises      General Comments General comments (skin integrity, edema, etc.): noted soiled from urine in chair so in standing removed brief and removed soiled linen from chair; new clean pad and  brief placed end of session      Pertinent Vitals/Pain Pain Assessment Pain Assessment: Faces Faces Pain Scale: Hurts even more Pain Location: back and knees with ambulation Pain Descriptors / Indicators: Aching Pain Intervention(s): Monitored during session    Home Living                          Prior Function            PT Goals (current goals can now be found in the care plan section) Progress towards PT goals: Progressing  toward goals    Frequency    Min 2X/week      PT Plan Current plan remains appropriate    Co-evaluation              AM-PAC PT "6 Clicks" Mobility   Outcome Measure  Help needed turning from your back to your side while in a flat bed without using bedrails?: A Little Help needed moving from lying on your back to sitting on the side of a flat bed without using bedrails?: A Little Help needed moving to and from a bed to a chair (including a wheelchair)?: A Little Help needed standing up from a chair using your arms (e.g., wheelchair or bedside chair)?: A Little Help needed to walk in hospital room?: A Little Help needed climbing 3-5 steps with a railing? : Total 6 Click Score: 16    End of Session Equipment Utilized During Treatment: Gait belt Activity Tolerance: Patient tolerated treatment well Patient left: in chair;with call bell/phone within reach;with chair alarm set   PT Visit Diagnosis: Unsteadiness on feet (R26.81);Other symptoms and signs involving the nervous system (R29.898)     Time: 1610-9604 PT Time Calculation (min) (ACUTE ONLY): 27 min  Charges:  $Gait Training: 8-22 mins $Therapeutic Activity: 8-22 mins                     Sheran Lawless, PT Acute Rehabilitation Services Office:[phone] 03/30/2022    Elray Mcgregor 03/30/2022, 5:08 PM

## 2022-03-30 NOTE — Progress Notes (Signed)
Mobility Specialist: Progress Note   03/30/22 1428  Mobility  Activity Ambulated with assistance in hallway  Level of Assistance Minimal assist, patient does 75% or more  Assistive Device Front wheel walker  Distance Ambulated (ft) 80 ft (70'+10')  Activity Response Tolerated well  $Mobility charge 1 Mobility   Pt received in the chair and agreeable to mobility. C/o BLE soreness during ambulation requiring x1 seated break just inside her room. Pt assisted to the recliner after session with call bell and phone at her side.   [facility] Abdulrahman Bracey Mobility Specialist Mobility Specialist [address]: [phone]

## 2022-03-30 NOTE — Progress Notes (Addendum)
Pt. Refusing to have vital signs measured this evening, and noted to be displaying aggressive body language and yelling at staff to get out of her room.

## 2022-03-30 NOTE — Discharge Instructions (Signed)
Follow with Primary MD Vincente Liberty, MD in 7 days   Get CBC, CMP, Magnesium, 2 view Chest X ray -  checked next visit within 1 week by SNF MD    Activity: As tolerated with Full fall precautions use walker/cane & assistance as needed  Disposition SNF  Diet: Soft diet with feeding assistance and aspiration precautions.  Special Instructions: If you have smoked or chewed Tobacco  in the last 2 yrs please stop smoking, stop any regular Alcohol  and or any Recreational drug use.  On your next visit with your primary care physician please Get Medicines reviewed and adjusted.  Please request your Prim.MD to go over all Hospital Tests and Procedure/Radiological results at the follow up, please get all Hospital records sent to your Prim MD by signing hospital release before you go home.  If you experience worsening of your admission symptoms, develop shortness of breath, life threatening emergency, suicidal or homicidal thoughts you must seek medical attention immediately by calling 911 or calling your MD immediately  if symptoms less severe.  You Must read complete instructions/literature along with all the possible adverse reactions/side effects for all the Medicines you take and that have been prescribed to you. Take any new Medicines after you have completely understood and accpet all the possible adverse reactions/side effects.

## 2022-03-30 NOTE — Progress Notes (Signed)
This RN and NT attempted to get vitals on patient throughout the night.  Pt asleep, but when woken up, pt cussing at staff members and refusing care.  This RN and NT were able to readjust patient and check to see if patient had voided.  Pt stated to "leave her the hell alone and get out of her house." This RN attempted to redirect patient and explain purpose for vitals and keeping patient clean.  Pt proceeded to continue to curse at staff.  Pt breathing even and non-labored and currently resting comfortably in chair. Chair alarm on and working.  Will continue to monitor.

## 2022-03-31 ENCOUNTER — Other Ambulatory Visit (HOSPITAL_COMMUNITY): Payer: Self-pay

## 2022-03-31 ENCOUNTER — Telehealth (HOSPITAL_COMMUNITY): Payer: Self-pay

## 2022-03-31 ENCOUNTER — Other Ambulatory Visit: Payer: Self-pay

## 2022-03-31 DIAGNOSIS — R296 Repeated falls: Secondary | ICD-10-CM | POA: Diagnosis not present

## 2022-03-31 DIAGNOSIS — S065XAA Traumatic subdural hemorrhage with loss of consciousness status unknown, initial encounter: Secondary | ICD-10-CM

## 2022-03-31 DIAGNOSIS — I609 Nontraumatic subarachnoid hemorrhage, unspecified: Secondary | ICD-10-CM

## 2022-03-31 MED ORDER — FAMOTIDINE 20 MG PO TABS
20.0000 mg | ORAL_TABLET | Freq: Every day | ORAL | 0 refills | Status: AC
  Filled 2022-03-31: qty 30, 30d supply, fill #0

## 2022-03-31 MED ORDER — CARVEDILOL 3.125 MG PO TABS
3.1250 mg | ORAL_TABLET | Freq: Two times a day (BID) | ORAL | 0 refills | Status: AC
  Filled 2022-03-31: qty 60, 30d supply, fill #0

## 2022-03-31 MED ORDER — DIVALPROEX SODIUM 125 MG PO CSDR
250.0000 mg | DELAYED_RELEASE_CAPSULE | Freq: Three times a day (TID) | ORAL | 0 refills | Status: DC
  Filled 2022-03-31: qty 180, 30d supply, fill #0

## 2022-03-31 MED ORDER — QUETIAPINE FUMARATE 100 MG PO TABS
100.0000 mg | ORAL_TABLET | Freq: Every day | ORAL | 0 refills | Status: DC
  Filled 2022-03-31: qty 30, 30d supply, fill #0

## 2022-03-31 MED ORDER — FAMOTIDINE 20 MG PO TABS: 20.0000 mg | ORAL_TABLET | Freq: Every day | ORAL | Status: DC

## 2022-03-31 NOTE — Telephone Encounter (Signed)
Prior Authorization has been submitted for Quetiapine 100mg  to [employer] [place] Medicare Part D plan.       Key # [id number]

## 2022-03-31 NOTE — TOC Transition Note (Signed)
Transition of Care [facility]) - CM/SW Discharge Note   Patient Details  Name: Nancy Suarez MRN: [id number] Date of Birth: March 07, 1932  [facility] CM/SW Contact:  Geralynn Ochs, LCSW Phone Number: 03/31/2022, 12:57 PM   Clinical Narrative:   CSW spoke with daughter, [name], about disposition. [name] wishes to take the patient home, concerned about how her mental status will go with a change of strange environment at SNF. Hopeful that patient will do better at home where she knows where she is. Daughter asking about assistance with mental health counseling due to patient's suicidal ideation on admission, and CSW provided information for [facility] on patient's AVS as they can assist with geriatric counseling. Patient previously active with [facility], [place] spoke with [facility] to resume care. CSW expressed to [facility] daughter's request for increased social work visits. Daughter to provide transport home. CSW notified RN. Awaiting meds from TOC. No other needs at this time.    Final next level of care: [place] Barriers to Discharge: Barriers Resolved   Patient Goals and CMS Choice Patient states their goals for this hospitalization and ongoing recovery are:: patient unable to participate in goal setting, only oriented to self CMS Medicare.gov Compare Post Acute Care list provided to:: Patient Represenative (must comment) Choice offered to / list presented to : Adult Children  Discharge Placement                Patient to be transferred to facility by: Daughter Name of family member notified: [name] Patient and family notified of of transfer: 03/31/22  Discharge Plan and Services                          HH Arranged: RN, PT, OT, Nurse's Aide, Social Work [employer] Agency: [place] ([place]) Date [place]: 03/31/22   Representative spoke with at [place]: [place] ([place])  Interventions     Readmission Risk Interventions    12/01/2021    1:14 PM  Readmission Risk Prevention Plan  Transportation Screening Complete  PCP or Specialist Appt within 3-5 Days Complete  HRI or [place] Complete  Social Work Consult for [place] Planning/Counseling Complete  Palliative Care Screening Complete  Medication Review Press photographer) Complete

## 2022-03-31 NOTE — Patient Outreach (Signed)
Nancy Suarez 06-12-1932 [id number]  Follow up: Extreme high risk for unplanned readmission  Patient was for DC to SNF and daughter decided to take home with [facility].  Previous [facility] outreaches prior to admission.  Plan: Referral request for [facility] follow up for care coordination and assess for further needs for readmission prevention needs.  For questions,  [name], RN BSN [place]  [phone] business mobile phone Toll free office [phone]  *[name]  [phone] Fax number: [phone] [name]

## 2022-03-31 NOTE — TOC Progression Note (Signed)
Transition of Care [facility]) - Progression Note    Patient Details  Name: Nancy Suarez MRN: [id number] Date of Birth: 1931/12/26  Transition of Care [facility]) CM/SW [place], [place] Phone Number: 03/31/2022, 12:54 PM  Clinical Narrative:   CSW spoke with daughter earlier today, as she was informed by patient's PCP about discharge but did not understand why patient could be discharged if she was still getting agitated. CSW discussed patient still had a sitter order this morning, so unable to DC to SNF until tomorrow at the earliest, if patient has a good night, and daughter asking about bed offers for SNF. Patient only has offer at [facility], which daughter does not want. Daughter considering taking patient home, but unsure given how agitated she was and still requiring IM Haldol twice yesterday. Daughter asking to speak to MD, CSW sent message. CSW spoke with daughter after discussion with MD, and agreed to see how patient does overnight and revisit discharge plan tomorrow. CSW to follow.    Expected Discharge Plan: [place] Barriers to Discharge: Continued Medical Work up, Ship broker, Requiring sitter/restraints  Expected Discharge Plan and Services Expected Discharge Plan: [place] arrangements for the past 2 months: Single Family Home Expected Discharge Date: 03/31/22                                     Social Determinants of Health (SDOH) Interventions    Readmission Risk Interventions    12/01/2021    1:14 PM  Readmission Risk Prevention Plan  Transportation Screening Complete  PCP or Specialist Appt within 3-5 Days Complete  HRI or [place] Complete  Social Work Consult for [place] Planning/Counseling Complete  Palliative Care Screening Complete  Medication Review Press photographer) Complete

## 2022-03-31 NOTE — Progress Notes (Signed)
Pt. Continues to refuse to allow staff to measure vital signs, perform care, or even be in her room.

## 2022-03-31 NOTE — Progress Notes (Addendum)
[facility]                                                                                                                                                                         Patient Demographics  Nancy Suarez, is a 88 y.o. female  ZDG:[id number]  [id number]:[id number]  DOB - [birth year]  Admit date - 03/12/2022  Admitting Physician Darlin Drop, DO  Outpatient Primary MD for the patient is Corine Shelter, MD  LOS - 18   Chief Complaint  Patient presents with   Dysuria        Assessment & Plan    Patient seen briefly today due for discharge soon per Discharge done yesterday by me, await SNF bed, no further issues, Vital signs stable, patient feels fine.  Daughter updated in detail on 03/30/2022.    Medications  Scheduled Meds:  acetaminophen  1,000 mg Oral BID   atorvastatin  20 mg Oral Daily   carvedilol  3.125 mg Oral BID WC   divalproex  250 mg Oral Q8H   escitalopram  10 mg Oral Daily   famotidine  10 mg Oral Daily   melatonin  3 mg Oral QHS   multivitamin with minerals  1 tablet Oral Daily   polyvinyl alcohol  1 drop Both Eyes TID   QUEtiapine  100 mg Oral QHS   sodium chloride flush  3 mL Intravenous Q12H   Continuous Infusions: PRN Meds:.acetaminophen **OR** acetaminophen, docusate sodium, guaiFENesin-dextromethorphan, haloperidol lactate **OR** [DISCONTINUED] risperiDONE, hydrALAZINE, sodium chloride flush    Time Spent in minutes   10 minutes   Susa Raring M.D on 03/31/2022 at 9:51 AM  Between 7am to 7pm - Pager - [phone]  After 7pm go to [email or web address] - password [email or web address]  And look for the night coverage person covering for me after hours  [facility] Office  [phone]    Subjective:   Nancy Suarez today is in recliner denies any headache chest or abdominal pain, calm.  Objective:   Vitals:   03/29/22 1524 03/30/22 0835 03/30/22 1530 03/31/22  0815  BP: 125/69 (!) 141/65 (!) 119/56 (!) 132/92  Pulse: 66 66 79 70  Resp: 18 16 16 18   Temp: 98.6 F (37 C) 97.7 F (36.5 C) 98.2 F (36.8 C) 98.7 F (37.1 C)  TempSrc: Oral Oral Oral Oral  SpO2: 100% 100% 99% 92%  Weight:      Height:        Wt Readings from Last 3 Encounters:  03/20/22 60.9 kg  03/01/22 62.2 kg  02/02/22 65.1 kg    No intake or output data in the 24 hours ending 03/31/22 0951  Exam  Awake with minimal confusion, calm affect no focal deficits  [place].AT,PERRAL Supple Neck, No JVD,   Symmetrical Chest wall movement, Good air movement bilaterally, CTAB RRR,No Gallops, Rubs or new Murmurs,  +ve B.Sounds, Abd Soft, No tenderness,   No Cyanosis, Clubbing or edema   Data Review

## 2022-04-01 DIAGNOSIS — R296 Repeated falls: Secondary | ICD-10-CM | POA: Diagnosis not present

## 2022-04-01 DIAGNOSIS — Z9181 History of falling: Secondary | ICD-10-CM | POA: Diagnosis not present

## 2022-04-01 DIAGNOSIS — N1831 Chronic kidney disease, stage 3a: Secondary | ICD-10-CM | POA: Diagnosis not present

## 2022-04-01 DIAGNOSIS — K219 Gastro-esophageal reflux disease without esophagitis: Secondary | ICD-10-CM | POA: Diagnosis not present

## 2022-04-01 DIAGNOSIS — S065X0D Traumatic subdural hemorrhage without loss of consciousness, subsequent encounter: Secondary | ICD-10-CM | POA: Diagnosis not present

## 2022-04-01 DIAGNOSIS — R45851 Suicidal ideations: Secondary | ICD-10-CM | POA: Diagnosis not present

## 2022-04-01 DIAGNOSIS — D509 Iron deficiency anemia, unspecified: Secondary | ICD-10-CM | POA: Diagnosis not present

## 2022-04-01 DIAGNOSIS — G9341 Metabolic encephalopathy: Secondary | ICD-10-CM | POA: Diagnosis not present

## 2022-04-01 DIAGNOSIS — F32A Depression, unspecified: Secondary | ICD-10-CM | POA: Diagnosis not present

## 2022-04-01 DIAGNOSIS — R911 Solitary pulmonary nodule: Secondary | ICD-10-CM | POA: Diagnosis not present

## 2022-04-01 DIAGNOSIS — I129 Hypertensive chronic kidney disease with stage 1 through stage 4 chronic kidney disease, or unspecified chronic kidney disease: Secondary | ICD-10-CM | POA: Diagnosis not present

## 2022-04-01 DIAGNOSIS — W19XXXD Unspecified fall, subsequent encounter: Secondary | ICD-10-CM | POA: Diagnosis not present

## 2022-04-01 DIAGNOSIS — G40909 Epilepsy, unspecified, not intractable, without status epilepticus: Secondary | ICD-10-CM | POA: Diagnosis not present

## 2022-04-01 DIAGNOSIS — E785 Hyperlipidemia, unspecified: Secondary | ICD-10-CM | POA: Diagnosis not present

## 2022-04-03 ENCOUNTER — Telehealth: Payer: Self-pay | Admitting: *Deleted

## 2022-04-03 NOTE — Chronic Care Management (AMB) (Signed)
  Care Coordination  Outreach Note  04/03/2022 Name: Nancy Suarez MRN: [id number] DOB: [birth year]   Care Coordination Outreach Attempts: An unsuccessful telephone outreach was attempted today to offer the patient information about available care coordination services as a benefit of their health plan.   Follow Up Plan:  Additional outreach attempts will be made to offer the patient care coordination information and services.   Encounter Outcome:  No Answer  [place]  Direct Dial: [phone]

## 2022-04-04 NOTE — Chronic Care Management (AMB) (Signed)
  Care Coordination   Note   04/04/2022 Name: Nancy Suarez MRN: [id number] DOB: [birth year]  Nancy Suarez is a 88 y.o. year old female who sees Vincente Liberty, MD for primary care. I reached out to Alda Lea by phone today to offer care coordination services from referral.  [name] was given information about Care Coordination services today including:   The Care Coordination services include support from the care team which includes your Nurse Coordinator, Clinical Social Worker, or Pharmacist.  The Care Coordination team is here to help remove barriers to the health concerns and goals most important to you. Care Coordination services are voluntary, and the patient may decline or stop services at any time by request to their care team member.   Care Coordination Consent Status: Patient daughter [name] [place] agreed to services and verbal consent obtained.   Follow up plan:  Telephone appointment with care coordination team member scheduled for:  04/10/22  Encounter Outcome:  Pt. Scheduled  [place]  Direct Dial: [phone]

## 2022-04-06 DIAGNOSIS — M2550 Pain in unspecified joint: Secondary | ICD-10-CM | POA: Diagnosis not present

## 2022-04-06 DIAGNOSIS — I119 Hypertensive heart disease without heart failure: Secondary | ICD-10-CM | POA: Diagnosis not present

## 2022-04-06 DIAGNOSIS — M6281 Muscle weakness (generalized): Secondary | ICD-10-CM | POA: Diagnosis not present

## 2022-04-06 DIAGNOSIS — R296 Repeated falls: Secondary | ICD-10-CM | POA: Diagnosis not present

## 2022-04-06 DIAGNOSIS — E7800 Pure hypercholesterolemia, unspecified: Secondary | ICD-10-CM | POA: Diagnosis not present

## 2022-04-06 DIAGNOSIS — Z79899 Other long term (current) drug therapy: Secondary | ICD-10-CM | POA: Diagnosis not present

## 2022-04-06 DIAGNOSIS — Z6828 Body mass index (BMI) 28.0-28.9, adult: Secondary | ICD-10-CM | POA: Diagnosis not present

## 2022-04-10 ENCOUNTER — Ambulatory Visit: Payer: Self-pay | Admitting: *Deleted

## 2022-04-10 NOTE — Patient Outreach (Signed)
  Care Coordination   04/10/2022 Name: Nancy Suarez MRN: [id number] DOB: 02-22-1932   Care Coordination Outreach Attempts:  An unsuccessful telephone outreach was attempted today to offer the patient information about available care coordination services as a benefit of their health plan.   Follow Up Plan:  Additional outreach attempts will be made to offer the patient care coordination information and services.  Sent to care guide for rescheduling.  Encounter Outcome:  No Answer  Care Coordination Interventions Activated:  No   Care Coordination Interventions:  No, not indicated    Jacqlyn Larsen [facility], [place] RN Care Coordinator [phone]

## 2022-04-11 NOTE — Telephone Encounter (Signed)
Received a fax regarding Prior Authorization from [facility] Medicare Part D for Quetiepine 100mg .  Authorization has been DENIED.  Key# [id number]

## 2022-04-24 ENCOUNTER — Telehealth: Payer: Self-pay | Admitting: *Deleted

## 2022-04-24 DIAGNOSIS — M1711 Unilateral primary osteoarthritis, right knee: Secondary | ICD-10-CM | POA: Diagnosis not present

## 2022-04-24 DIAGNOSIS — S72141D Displaced intertrochanteric fracture of right femur, subsequent encounter for closed fracture with routine healing: Secondary | ICD-10-CM | POA: Diagnosis not present

## 2022-04-24 NOTE — Chronic Care Management (AMB) (Signed)
  Care Coordination Note  04/24/2022 Name: Nancy Suarez MRN: [id number] DOB: [birth year]  Nancy Suarez is a 88 y.o. year old female who is a primary care patient of Vincente Liberty, MD and is actively engaged with the care management team. I reached out to Alda Lea by phone today to assist with re-scheduling an initial visit with the RN Case Manager  Follow up plan: A telephone outreach attempt made. Patient daughter [name] not available for call.   [place]  Direct Dial: [phone]

## 2022-04-27 DIAGNOSIS — F03918 Unspecified dementia, unspecified severity, with other behavioral disturbance: Secondary | ICD-10-CM | POA: Diagnosis not present

## 2022-04-27 DIAGNOSIS — M1990 Unspecified osteoarthritis, unspecified site: Secondary | ICD-10-CM | POA: Diagnosis not present

## 2022-04-27 DIAGNOSIS — M2550 Pain in unspecified joint: Secondary | ICD-10-CM | POA: Diagnosis not present

## 2022-04-27 DIAGNOSIS — R531 Weakness: Secondary | ICD-10-CM | POA: Diagnosis not present

## 2022-05-01 DIAGNOSIS — K219 Gastro-esophageal reflux disease without esophagitis: Secondary | ICD-10-CM | POA: Diagnosis not present

## 2022-05-01 DIAGNOSIS — E785 Hyperlipidemia, unspecified: Secondary | ICD-10-CM | POA: Diagnosis not present

## 2022-05-01 DIAGNOSIS — R45851 Suicidal ideations: Secondary | ICD-10-CM | POA: Diagnosis not present

## 2022-05-01 DIAGNOSIS — R911 Solitary pulmonary nodule: Secondary | ICD-10-CM | POA: Diagnosis not present

## 2022-05-01 DIAGNOSIS — G9341 Metabolic encephalopathy: Secondary | ICD-10-CM | POA: Diagnosis not present

## 2022-05-01 DIAGNOSIS — D509 Iron deficiency anemia, unspecified: Secondary | ICD-10-CM | POA: Diagnosis not present

## 2022-05-01 DIAGNOSIS — F32A Depression, unspecified: Secondary | ICD-10-CM | POA: Diagnosis not present

## 2022-05-01 DIAGNOSIS — S065X0D Traumatic subdural hemorrhage without loss of consciousness, subsequent encounter: Secondary | ICD-10-CM | POA: Diagnosis not present

## 2022-05-01 DIAGNOSIS — I129 Hypertensive chronic kidney disease with stage 1 through stage 4 chronic kidney disease, or unspecified chronic kidney disease: Secondary | ICD-10-CM | POA: Diagnosis not present

## 2022-05-01 DIAGNOSIS — W19XXXD Unspecified fall, subsequent encounter: Secondary | ICD-10-CM | POA: Diagnosis not present

## 2022-05-01 DIAGNOSIS — G40909 Epilepsy, unspecified, not intractable, without status epilepticus: Secondary | ICD-10-CM | POA: Diagnosis not present

## 2022-05-01 DIAGNOSIS — Z9181 History of falling: Secondary | ICD-10-CM | POA: Diagnosis not present

## 2022-05-01 DIAGNOSIS — R296 Repeated falls: Secondary | ICD-10-CM | POA: Diagnosis not present

## 2022-05-01 DIAGNOSIS — N1831 Chronic kidney disease, stage 3a: Secondary | ICD-10-CM | POA: Diagnosis not present

## 2022-05-02 NOTE — Chronic Care Management (AMB) (Signed)
  Care Coordination Note  05/02/2022 Name: [name] MRN: [id number] DOB: 1932/06/29  Nancy Suarez is a 88 y.o. year old female who is a primary care patient of Vincente Liberty, MD and is actively engaged with the care management team. I reached out to Alda Lea by phone today to assist with re-scheduling an initial visit with the RN Case Manager  Follow up plan: Unsuccessful telephone outreach attempt made. A HIPAA compliant phone message was left for the patient providing contact information and requesting a return call.  We have been unable to make contact with the patient for follow up. The care management team is available to follow up with the patient after provider conversation with the patient regarding recommendation for care management engagement and subsequent re-referral to the care management team.   [place]  Direct Dial: [phone]

## 2022-06-15 NOTE — Progress Notes (Signed)
Primary neurologist: Dr. Rexene Alberts Reason for visit: Cognitive decline     Chief Complaint  Patient presents with   Room 2    Pt is here with her Daughter Nancy Suarez. Pt's daughter states that she is more agitated than last time. Pt's daughter states that she's "sun downing" more frequently.     HISTORY OF PRESENT ILLNESS:   Update 06/19/2022 JM: Patient returns for 32-monthfollow-up accompanied by her daughter.  She was seen in the ED on 8/29 after a fall with evidence of acute subarachnoid hemorrhage at left frontal cortex.  Seen by neurosurgery without intervention needed.  She was kept overnight for observation and discharged home the following day.  She returned to ED on 9/10 for recurrent falls, confusion and generalized weakness.  She was found to have subdural hematoma managed conservatively by neurosurgery as well as AKI.  Evaluated by psychiatry for suicidal ideation, was placed on Depakote and Seroquel, felt symptoms were related to sundowning and dementia episode of seizure-like activity on 9/22 working with PT. Spot EEG no seizures.  Evaluated by neurology and increased Depakote dosage without any further seizure activity.  Therapies recommended SNF but daughter wished to bring patient home (as she kept getting declined by facilities) with home health therapies and outpatient behavioral health follow-up.   Since above admissions, daughter notes sundowning behaviors usually occurring mid afternoon until late evening.  Currently receiving Depakote 125 mg 4 times daily (was taking 250 mg 4 times daily but dosage reduced by PCP per daughter), currently taking around 8am, 1pm, 5pm and bedtime dose varies between 10pm-3am as daughter will have a hard time getting her to go to bed. Use of Seroquel 100 mg nightly around similar timeframe as daughter tries to give at bedtime. No significant daytime behaviors. Can have occasional visual hallucinations. Has not yet seen outpatient  psychiatry, daughter reports they have not been contacted to schedule f/u visit as recommended at hospital d/c.  Neurocognitive evaluation not further pursued due to above admissions (was scheduled during admission). Does have caregiver assistance while daughter working. [facility], has had a couple falls, thankfully without significant injury.     History provided for reference purposes only UPDATE 02/14/2022 JM: Patient returns for acute visit due to worsening cognition with frequent mood changes and outbursts.  She is accompanied by her daughter who provides history.   She was evaluated in the ED on 8/3 as family reported patient acting differently the past few days with increased confusion.  Noted recent medication change of stopping Lexapro and switching to Zoloft and then back to Lexapro as she had difficulty tolerating Zoloft.  Lab work and urine unremarkable. Declined CT imaging at that time. Per ED note, daughter out of town for business which seemed to cause significant stress to the patient and pt did report to a nurse "she has been acting up because her daughter has not been home.  Patient states that she feels fine."  She had been previously hospitalized back in 04/2021 for AMS and felt due to polypharmacy.  She was discharged back home after 3 hours.  Of note, hospitalized 5/28 -6/5 with right hip fracture 2/2 fall and underwent operative management.  Noted increased agitation and confusion after trial of narcotic and improved after discontinuing.  Placed on trial of Seroquel at night.  Discharged to SNF. She returned back home after 2 week stay.   Daughter reports since she has returned home from SNF back  in June, she has had more issues without outbursts, agitation, confusion (unsure where she is at when she is at home), hallucinations (will see bugs and people) and sundowning. Daughter reports these behaviors can worsen if she needs to leave the home to go to work,  she gets upset that she is left at home, feels bored and that her family isn't doing enough for her.  Patient lives with her daughter, daughters husband and children. Prior to hospitalization back in May, she was doing relatively stable with occasional sundowning but able to easily be redirected.  Since she has returned home, daughter has had a difficult time redirecting her and calming her down.  She was previously going to day programs but has not yet returned since she broke her hip.  She is still working with home health therapy but daughter is hopeful she will be able to return soon.  She does endorse frustration of not being able to do things independently as she used to and being unable to drive.  Sleeps well once she falls asleep but typically not until 11pm-12am. Reports good appetite.  She is currently on Lexapro and mirtazapine for mood.  Geriatric depression scale 11.  Prior intolerance to Namenda, Seroquel and Zoloft. MMSE today 17/30 (prior 21/30 in 06/2021).  Daughter is understandably frustrated with obvious caregiver burnout, she has participating in some support groups.  No further concerns at this time  Update 06/07/2021 ALL: KEYLEIGH MANNINEN is a 88 y.o. female here today for follow up for memory loss. She was last seen by Dr Rexene Alberts 01/2021. PCP had decreased Ambien dose to 58m QHS and decreased gabapentin dose. She continued hydrocodone twice daily, Remeron 141mQHS and lexapro 1064maily. Dr AthRexene Albertss hesitant to start Aricept due to low heart rate and opted to start low dose Namenda. She was advised to start 5mg27mily for 1 month then increase to BID dosing. Her daughter called to report behavioral changes 03/10/2021. She was advised to stop memantine. Daughter called again 03/30/2021 with acute behavioral concerns and advised to seek eval with PCP for possible infection. She was hospitalized for UTI 04/13/2021. At discharge, Ambien was discontinued. She was encouraged to continue weaning Remeron  and Percocet (switched from hydrocodone? By PCP). She reports being completely weaned off Percocet. She continues Remeron 15mg31m and escitalopram 10mg 21my. She was placed in resite care at [facility] weeks following discharge. She reports having two falls while there. She is using a walker. She has been participating in PT for the past 3 weeks. She lives with her daughter, [name].Nancy Fredericksonreports that [name] Nancy Suarez her take a bath. She reports her appetite is really good. She is sleeping well. She does not drive.    HISTORY (copied from Dr Athar'Guadelupe Sabinous note)  Ms. Boss Kessen 89 yea43old right-handed woman with an underlying medical history of reflux disease, goiter, hypertension, hyperlipidemia, osteopenia, depression, pancreatitis, spinal arthritis, scoliosis, anemia, chronic kidney disease, anxiety, joint pain and insomnia, who presents for FU consultation of her memory loss. The patient is accompanied by her daughter, [name],Nancy Fredericksonn today.  I first met her on 09/06/20 at the request of her PCP, at which time she was reported to have forgetfulness for at least 6 months.  Her MMSE was 20/30 at the time. I suggest we proceed with additional blood work and a brain MRI.  Her labs included Hemoglobin A1c, vitamin B6, vitamin D, CRP, rheumatoid factor, B12 and B1.Her rheumatoid factor was positive.  Other test results were benign.  She was notified of the results.We had also talked about medication affecting her cognitive function as she was on multiple medications at the time. She was advised to discuss medication management with her primary care physician. She had a brain MRI without contrast on 09/23/2020 and I reviewed the results:   IMPRESSION: Unremarkable MRI scan of the brain without contrast showing only mild age-related changes of chronic small vessel disease and generalized cerebral atrophy.   She was notified of the test results.   Today, 03/01/21: She reports feeling fairly stable, she does not  give much in the way of her own history but reports that she has been on Ambien for many years.  She is reluctant to come off of it.  Daughter reports that patient has been staying with her for nearly 2 years in December of this year.  Patient has had recent mood irritability and frustration.  Daughter reports that her stepson moved in recently and is currently staying with them, he is 37 years old.  Stepdaughter is 61 years old and was also staying with them temporarily and just a few days ago moved out.  While the stepchildren were staying in their home patient's daughter reports that she took a brief trip with her husband for their anniversary.  While she was gone, patient had more mood irritability and refused to take her medications.  The day.  They came back this past weekend, patient had taken her Ambien and had a fall as she got up shortly after taking the Ambien.  Thankfully, she did not injure herself.  She did not lose consciousness or hit her head but daughter reports that she looked really dazed and groggy.  Patient's daughter is still concerned about patient's medications.  However, her primary care physician has been able to reduce her medications, she is currently on Ambien 5 mg strength half a pill daily at bedtime.  Gabapentin has been reduced from 300 mg to 100 mg and she currently takes it only once a day.  Hydrocodone is currently twice daily as needed.  She typically does take it twice daily.  She continues to be on Remeron 15 mg at bedtime.  She continues to be on Lexapro 10 mg daily.  Memory function per se is stable with the exception that patient has confusion from time to time per daughter.  Patient lives with Nancy Suarez, [place] husband and their 44 year old daughter.  Per Nancy Suarez, patient is very close to her 75 year old granddaughter but due to recent mood irritability, that relationship has been strained.  Daughter just started school after the summer break, fifth grade.     REVIEW OF  SYSTEMS: Out of a complete 14 system review of symptoms, the patient complains only of the following symptoms as listed in HPI and all other reviewed systems are negative.   ALLERGIES: Allergies  Allergen Reactions   Ambien [Zolpidem] Other (See Comments)    Disorientation Aggression    Aspirin Other (See Comments)    Upsets stomach if 366m   Codeine Nausea And Vomiting   Namenda [Memantine] Other (See Comments)    Agitation Aggression    Percocet [Oxycodone-Acetaminophen] Other (See Comments)    Disorientation Aggression     Sulfa Antibiotics Nausea Only     HOME MEDICATIONS: Outpatient Medications Prior to Visit  Medication Sig Dispense Refill   acetaminophen (TYLENOL) 500 MG tablet Take 2 tablets (1,000 mg total) by mouth 2 (two) times daily. (Patient taking differently: Take  1,000 mg by mouth See admin instructions. 1000 mg twice daily (morning and evening) + an additional 500 mg during the day as needed for pain.) 30 tablet 0   atorvastatin (LIPITOR) 20 MG tablet Take 20 mg by mouth daily.     carboxymethylcellulose (REFRESH PLUS) 0.5 % SOLN Place 1 drop into both eyes in the morning, at noon, and at bedtime.     carvedilol (COREG) 3.125 MG tablet Take 1 tablet (3.125 mg total) by mouth 2 (two) times daily with a meal. 60 tablet 0   docusate sodium (COLACE) 100 MG capsule Take 100 mg by mouth daily as needed for mild constipation.     escitalopram (LEXAPRO) 10 MG tablet Take 10 mg by mouth daily.     famotidine (PEPCID) 20 MG tablet Take 1 tablet (20 mg total) by mouth daily. 30 tablet 0   Fexofenadine HCl (ALLEGRA ALLERGY PO) Take 1 tablet by mouth daily.     MELATONIN PO Take 2 tablets by mouth at bedtime.     Menthol, Topical Analgesic, (BIOFREEZE EX) Apply 1 application  topically 2 (two) times daily as needed (knee pain).     Multiple Vitamins-Minerals (MULTIVITAMIN ADULT) CHEW Chew 1 tablet by mouth daily.     divalproex (DEPAKOTE SPRINKLE) 125 MG capsule Take 2  capsules (250 mg total) by mouth every 8 (eight) hours. 180 capsule 0   QUEtiapine (SEROQUEL) 100 MG tablet Take 1 tablet (100 mg total) by mouth at bedtime. 30 tablet 0   fluticasone (FLONASE) 50 MCG/ACT nasal spray Place 1-2 sprays into both nostrils daily as needed for allergies. (Patient not taking: Reported on 06/19/2022)     No facility-administered medications prior to visit.     PAST MEDICAL HISTORY: Past Medical History:  Diagnosis Date   Allergic rhinitis    ASCUS (atypical squamous cells of undetermined significance) on Pap smear    Atrophic vaginitis    Chronic insomnia    Depression    GERD (gastroesophageal reflux disease)    Goiter    History of kidney stones    Hypercholesteremia    Hypertension    LGSIL (low grade squamous intraepithelial dysplasia)    Osteopenia    Ovarian cyst    "shrank it"   Pancreatitis    Scoliosis    Spinal arthritis      PAST SURGICAL HISTORY: Past Surgical History:  Procedure Laterality Date   ABDOMINAL HYSTERECTOMY  1977   partial   INTRAMEDULLARY (IM) NAIL INTERTROCHANTERIC Right 11/28/2021   Procedure: INTRAMEDULLARY (IM) NAIL INTERTROCHANTRIC;  Surgeon: Rod Can, MD;  Location: [facility];  Service: Orthopedics;  Laterality: Right;   KIDNEY STONE SURGERY  1990's   2-3 stones x1 surgery   ORIF FEMUR FRACTURE Left 06/09/2017   Procedure: OPEN REDUCTION INTERNAL FIXATION (ORIF) DISTAL FEMUR FRACTURE;  Surgeon: Paralee Cancel, MD;  Location: [facility];  Service: Orthopedics;  Laterality: Left;   TONSILLECTOMY  as teenager   TOTAL KNEE ARTHROPLASTY Left 01/19/2014   Procedure: LEFT TOTAL KNEE ARTHROPLASTY;  Surgeon: Mauri Pole, MD;  Location: [facility];  Service: Orthopedics;  Laterality: Left;   WISDOM TOOTH EXTRACTION       FAMILY HISTORY: Family History  Problem Relation Age of Onset   Hypertension Father    Cancer Father    Cancer Mother        Bone   Cancer Brother      SOCIAL HISTORY: Social History    Socioeconomic History   Marital status: Married  Spouse name: Not on file   Number of children: Not on file   Years of education: Not on file   Highest education level: Not on file  Occupational History   Not on file  Tobacco Use   Smoking status: Former    Years: 20.00    Types: Cigarettes    Quit date: 07/03/1978    Years since quitting: 43.9   Smokeless tobacco: Former  Scientific laboratory technician Use: Never used  Substance and Sexual Activity   Alcohol use: Yes    Comment: occ   Drug use: No   Sexual activity: Yes    Birth control/protection: Surgical    Comment: hysterectomy  Other Topics Concern   Not on file  Social History Narrative   Not on file   Social Determinants of Health   Financial Resource Strain: Not on file  Food Insecurity: Not on file  Transportation Needs: Not on file  Physical Activity: Not on file  Stress: Not on file  Social Connections: Not on file  Intimate Partner Violence: Not on file     PHYSICAL EXAM  Vitals:   06/19/22 1340  BP: (!) 156/70  Pulse: 73  Weight: 140 lb (63.5 kg)  Height: _0  (1.651 m)    Body mass index is 23.3 kg/m.   Generalized: Frail pleasant elderly African-American female, seated in wheelchair, tearful during visit  Cardiology: normal rate and rhythm, no murmur auscultated  Respiratory: clear to auscultation bilaterally    Neurological examination  Mentation: Alert oriented to place and self but disoriented to time. Follows all commands speech and language fluent.  Cooperative with exam    06/19/2022    2:18 PM 02/14/2022    9:15 AM 06/07/2021    9:03 AM  MMSE - Mini Mental State Exam  Orientation to time _1 Orientation to Place _2 Registration _3 Attention/ Calculation 0 0 1  Attention/Calculation-comments   did WORLD  Recall 0 0 1  Language- name 2 objects _4 Language- repeat 0 0 1  Language- follow 3 step command _5 Language- read & follow direction _6 Write a  sentence _7 Copy design 0 0 0  Total score _8 Cranial nerve II-XII: Pupils were equal round reactive to light. Extraocular movements were full, visual field were full on confrontational test. Facial sensation and strength were normal. Uvula tongue midline. Head turning and shoulder shrug  were normal and symmetric. Motor: The motor testing reveals 5 over 5 strength of bilateral upper extremities, R>L BLE weakness. Good symmetric motor tone is noted throughout.  Gait and station: Gait was not assessed     DIAGNOSTIC DATA (LABS, IMAGING, TESTING) - I reviewed patient records, labs, notes, testing and imaging myself where available.  Lab Results  Component Value Date   WBC 4.4 03/30/2022   HGB 10.2 (L) 03/30/2022   HCT 31.9 (L) 03/30/2022   MCV 94.1 03/30/2022   PLT 197 03/30/2022      Component Value Date/Time   NA 143 03/30/2022 0613   K 4.4 03/30/2022 0613   CL 106 03/30/2022 0613   CO2 28 03/30/2022 0613   GLUCOSE 81 03/30/2022 0613   BUN 27 (H) 03/30/2022 0613   CREATININE 1.38 (H) 03/30/2022 0613   CALCIUM 9.3 03/30/2022 0613   PROT 5.4 (L) 03/25/2022 1059   ALBUMIN 3.0 (L) 03/25/2022 1059  AST 17 03/25/2022 1059   ALT 16 03/25/2022 1059   ALKPHOS 69 03/25/2022 1059   BILITOT 0.5 03/25/2022 1059   GFRNONAA 37 (L) 03/30/2022 0613   GFRAA 41 (L) 06/11/2017 0404   No results found for: "CHOL", "HDL", "LDLCALC", "LDLDIRECT", "TRIG", "CHOLHDL" Lab Results  Component Value Date   HGBA1C 4.9 09/06/2020   Lab Results  Component Value Date   ZYYQMGNO03 704 03/19/2022   Lab Results  Component Value Date   TSH 3.261 03/12/2022        ASSESSMENT AND PLAN  88 y.o. year old female here with    Dementia with behavioral disturbance (HCC) - Plan: QUEtiapine (SEROQUEL) 50 MG tablet, divalproex (DEPAKOTE SPRINKLE) 125 MG capsule, Ambulatory referral to [facility]  Depression with anxiety - Plan: Ambulatory referral to [facility] adjusting Depakote dosage due to sundowning behaviors and visual hallucinations.  Taking Depakote sprinkles 125 mg capsule 4 times daily, recommend continuing 1 capsule A.M. and Midafternoon and increase evening and bedtime dosage to 2 capsules.  Depakote level 49 (03/2022), can repeat at f/u visit. Continue Seroquel 100 mg but recommend splitting dosage to see if this further helps with sundowning behaviors to 44m around 5pm and 72mat bedtime.  Recommend taking Depakote and Seroquel bedtime dosages consistently around 8 to 9 PM.  Referral placed to behavioral health as recommended during recent admissions (see HPI).  Suspect underlying depression/anxiety potentially contributing.  Can hold off on neurocognitive testing for now.      Follow-up in 4 months with Dr. AtRexene Albertsor further medication management if needed or call earlier if needed     Orders Placed This Encounter  Procedures   [facility]  Referral Priority:   Routine    Referral Type:   Psychiatric    Referral Reason:   Specialty Services Required    [facility]ed Specialty:   [facility]  Number of Visits Requested:   1    Meds ordered this encounter  Medications   QUEtiapine (SEROQUEL) 50 MG tablet    Sig: Take 0.5 tablets (25 mg total) by mouth every evening AND 1.5 tablets (75 mg total) at bedtime.    Dispense:  60 tablet    Refill:  3   divalproex (DEPAKOTE SPRINKLE) 125 MG capsule    Sig: Take 1 tab morning and afternoon, take 2 tablets evening and bedtime    Dispense:  180 capsule    Refill:  5      I spent 34 minutes of face-to-face and non-face-to-face time with patient and daughter.  This included previsit chart review, lab review, study review, order entry, electronic health record documentation, patient education and prolonged discussion regarding above diagnoses, current treatment plan and future treatment plan and answered all the questions to  patient and daughter satisfaction   JeFrann RiderAGRock [facility]Gu[facility] [id number]uEmpirer[place] [address]Phone [phone] [phone]: This document was prepared with digital dictation and possible smart phrase technology. Any transcriptional errors that result from this process are unintentional.  CC:  [facility] provider: KiVincente LibertyMD

## 2022-06-19 ENCOUNTER — Encounter: Payer: Self-pay | Admitting: Adult Health

## 2022-06-19 ENCOUNTER — Ambulatory Visit (INDEPENDENT_AMBULATORY_CARE_PROVIDER_SITE_OTHER): Payer: Medicare Other | Admitting: Adult Health

## 2022-06-19 VITALS — BP 156/70 | HR 73 | Ht 65.0 in | Wt 140.0 lb

## 2022-06-19 DIAGNOSIS — F418 Other specified anxiety disorders: Secondary | ICD-10-CM

## 2022-06-19 DIAGNOSIS — F03918 Unspecified dementia, unspecified severity, with other behavioral disturbance: Secondary | ICD-10-CM | POA: Diagnosis not present

## 2022-06-19 MED ORDER — QUETIAPINE FUMARATE 50 MG PO TABS: ORAL_TABLET | ORAL | 3 refills | Status: DC

## 2022-06-19 MED ORDER — DIVALPROEX SODIUM 125 MG PO CSDR: DELAYED_RELEASE_CAPSULE | ORAL | 5 refills | Status: DC

## 2022-06-19 NOTE — Patient Instructions (Addendum)
Your Plan:  Increase Depakote dosage - continue 1 tablet morning and afternoon, take 2 tablets evening (around 5pm and 8pm-9pm)  Can try to split Seroquel dosage - take 25mg  around 5pm and 75mg  around 8-9pm  Referral placed to behavioral health - you will be called to schedule initial evaluation     Follow up with Dr. in 4 months or call earlier if needed     Thank you for coming to see at [facility]. I hope we have been able to provide you high quality care today.  You may receive a patient satisfaction survey over the next few weeks. We would appreciate your feedback and comments so that we may continue to improve ourselves and the health of our patients.

## 2022-06-20 ENCOUNTER — Telehealth: Payer: Self-pay | Admitting: Adult Health

## 2022-06-20 DIAGNOSIS — M2550 Pain in unspecified joint: Secondary | ICD-10-CM | POA: Diagnosis not present

## 2022-06-20 DIAGNOSIS — R296 Repeated falls: Secondary | ICD-10-CM | POA: Diagnosis not present

## 2022-06-20 DIAGNOSIS — R531 Weakness: Secondary | ICD-10-CM | POA: Diagnosis not present

## 2022-06-20 DIAGNOSIS — Q820 Hereditary lymphedema: Secondary | ICD-10-CM | POA: Diagnosis not present

## 2022-06-20 NOTE — Telephone Encounter (Signed)
Referral for Behavioral Health sent through [facility] to [facility]. Phone: [phone], Fax: [phone]

## 2022-06-27 ENCOUNTER — Encounter: Payer: Self-pay | Admitting: Adult Health

## 2022-06-28 DIAGNOSIS — M8589 Other specified disorders of bone density and structure, multiple sites: Secondary | ICD-10-CM | POA: Diagnosis not present

## 2022-06-28 DIAGNOSIS — M810 Age-related osteoporosis without current pathological fracture: Secondary | ICD-10-CM | POA: Diagnosis not present

## 2022-06-28 DIAGNOSIS — Z780 Asymptomatic menopausal state: Secondary | ICD-10-CM | POA: Diagnosis not present

## 2022-07-05 DIAGNOSIS — H04123 Dry eye syndrome of bilateral lacrimal glands: Secondary | ICD-10-CM | POA: Diagnosis not present

## 2022-07-05 DIAGNOSIS — H43813 Vitreous degeneration, bilateral: Secondary | ICD-10-CM | POA: Diagnosis not present

## 2022-07-05 DIAGNOSIS — H25812 Combined forms of age-related cataract, left eye: Secondary | ICD-10-CM | POA: Diagnosis not present

## 2022-07-05 DIAGNOSIS — H402234 Chronic angle-closure glaucoma, bilateral, indeterminate stage: Secondary | ICD-10-CM | POA: Diagnosis not present

## 2022-07-21 ENCOUNTER — Encounter (HOSPITAL_COMMUNITY): Payer: Self-pay | Admitting: Psychiatry

## 2022-07-21 ENCOUNTER — Ambulatory Visit (HOSPITAL_BASED_OUTPATIENT_CLINIC_OR_DEPARTMENT_OTHER): Payer: Medicare Other | Admitting: Psychiatry

## 2022-07-21 DIAGNOSIS — F03918 Unspecified dementia, unspecified severity, with other behavioral disturbance: Secondary | ICD-10-CM

## 2022-07-21 DIAGNOSIS — F339 Major depressive disorder, recurrent, unspecified: Secondary | ICD-10-CM

## 2022-07-21 MED ORDER — ESCITALOPRAM OXALATE 10 MG PO TABS: 15.0000 mg | ORAL_TABLET | Freq: Every day | ORAL | 1 refills | Status: DC

## 2022-07-21 NOTE — Progress Notes (Signed)
Psychiatric Initial Adult Assessment   Patient Identification: Nancy Suarez MRN:  [id number] Date of Evaluation:  07/21/2022 Referral Source: Neurology Chief Complaint:   Chief Complaint  Patient presents with   Establish Care   Depression   Agitation   Visit Diagnosis:    ICD-10-CM   1. Dementia with behavioral disturbance (HCC)  F03.918     2. Recurrent major depressive disorder, remission status unspecified (HCC)  F33.9        Assessment:  Nancy Suarez is a 89 y.o. female with a history of depression, dementia with behavioral disturbance, chronic IDA, HLD, CKD stage 3 who presents virtually to [facility] at [facility] for initial evaluation on 07/21/2022.  Patient reports symptoms of poor memory, visual hallucinations, behavioral disturbance, and depression.  During initial exam patient was found to only be oriented to person and month, while she was unsure of the day, year, and her location.  She was noted to be disorganized and had difficulty answering questions coherently at times.  Collateral obtained from the daughter confirmed that patient has had gradually worsening cognitive function and memory with a notable decline following her hospitalization in August 2023.  Patient has been experiencing episodes of sundowning and delirium in the afternoon/evenings.  Patient is also experiencing visual hallucinations that began after the memory decline.  There is a family history of Alzheimer's and the patient's brother.  Of note patient's MMSE on 06/19/22 was 15 and has been trending down over the past year.  Head CT from September 2023 showed recovering subdural hematoma, subacute anterior left frontal infarct, underlying atrophy with chronic small vessel ischemic change, and recovering periorbital hematoma.  At this time patient meets criteria for dementia with behavioral disturbance and MDD.  The specific nature of her dementia is unclear though there are signs  consistent with Lewy body dementia.  We would recommend continuing to coordinate with neurology to discuss neurocognitive testing and possible addition of cholinesterase inhibitor medications to help modulate dementia symptoms.  In regards to depression we will increase Lexapro to 15 mg daily and discussed the risk and benefits.  Creatinine clearance was calculated and noted to be between 25-28.  We will continue the Depakote and Seroquel for management of behavioral disturbances and assess further adjustments in those at patient's next appointment in person.  We also did discuss some behavioral modifications including keeping the room dark at night and adding bed rails or an alarm to help with nighttime wandering.   Plan: - Continue Seroquel 100 mg QHS - Continue Depakote 125 mg QID - Increase Lexapro to 15 mg QD - CMP, CBC, UA, Depakote level reviewed - CT head w/o contrast from 03/24/22 reviewed - Crisis resources reviewed - Follow up in 4 to 6 weeks  History of Present Illness:  Met with Nancy Suarez and her daughter Nancy Suarez. Spoke with Nancy Suarez first before obtaining collateral from her daughter.  Nancy Suarez was able to provide some degree of history though would become illogical or disorganized at times, evidenced by not understanding questions or answering with illogical answers. Nancy Suarez reports that she is not entirely sure what the appointment is for though does note having trouble with depression and memory.  When asked about her symptoms she reports that the memory troubles started around a year ago after she had a fall that really injured her.  Patient was unsure of the specifics but did note that she was hospitalized following this.  When asked about her memory patient had some difficulty  elaborating other than stating that there are times where she does not know where she is, what day it is, or why she is somewhere.  Today she was able to tell us the month and knew she was  She, but was unsure of the  day, year, or where she was.  Nancy Suarez had also been unable to correctly report the age of her granddaughter continuously stating she is 1 instead 3711.  Patient reports that her sleep has been an issue for an extended.  However seems to be a bit better recently since her medications have been decreased.  Patient also does endorse experiencing hallucinations where she can see trash bugs crawling around the house that others cannot. In regards to the depression patient was unsure if she is currently depressed but did note that she struggled with depression when she was younger.  She reports that this is while she is working as part due to isolation from her husband not being around as frequently.  Patient notes that she saw a psychiatrist at that time.  There was some question about possible suicide attempt around that time though patient's daughter could not verify whether that was correct.   Patient's daughter Nancy Suarez joined the session to provide collateral.  She reports that Nancy Suarez had been struggling with signs of memory loss for the past few years however the fall a year ago seemed to significantly speed up the process.  Nancy Suarez was hospitalized after a fall and was found to have subdural hematoma.  She spent 3 weeks in the hospital during which time she had difficulties with sleep and increased agitation.  Psychiatry had been consulted and started her on Depakote and Risperdal.  At time of discharge patient had been switched from Risperdal to Seroquel due to oversedation on the Risperdal.  Due to episodes of agitation most notable at night patient was unable to go to an assisted living from the hospital.  Since then Nancy Suarez has been living at home with her daughter and her family who have been functioning as a caretaker.  They do note that there is one caretaker who comes in during the weekdays to help out.  For now they are not considering sending her to an assisted living facility.  Since returning home Nancy Suarez  notes that her mother has been dealing with sundowning almost every day around 3 to 4 PM in addition to experiencing evening/night confusion.  Patient can get up and wander around the middle of night which is difficult for the family to manage and concerning for potential falls.  In addition to the sundowning and increased agitation at night patient's daughter has also noticed Lynnita experiencing hallucinations.  These typically revolve around seeing trash for bugs in the house.  Nancy Suarez has found some benefit with medications and her mother noting that the Seroquel has helped with sleep and the Depakote helped some with the agitation.  She had changed her Seroquel dosing to add daytime dose and increase the evening and bedtime Depakote doses per neurology recommendations however found that there was increased agitation with this.  They returned to the former dosing protocol per neurology recommendations after that.  In regards to depression Nancy HesselbachMaria does have concerns that her mom is not at her baseline.  She notes that there was an episode recently where mom threatened to throw herself down the stairs.  We discussed behavioral modification techniques for dementia such as keeping the bedroom dark, adding an alarm pad to the bed, and considering getting  a hospital bed or walls to be used on the bed at night.  In regards to depressed mood while patient denied feeling depressed her daughter felt this was not true.  They note some improvement on the current dose of Lexapro and we discussed the possibility of increasing.  We did go over the risk and benefits of increasing the dose past 10 mg notably patient's. We also discussed patient's dementia and medication regimen.  Patient's daughter is unsure about the type of dementia that the patient has though notes her brother had Alzheimer's.  We discussed how medications can be used to help slow the progression of dementia and the benefits of neurocognitive testing for clarity  on dementia type.  Due to the virtual nature of visit where I will perform a physical exam Nancy Suarez noted her mother has not had any significant parkinsonian symptoms.  She does note that her mother had experienced memory loss followed by the onset of hallucinations.  She also notes that while the memory loss started and gradually there did seem to be significant decline following her head trauma in August 2023.  Patient had been scheduled for neurocognitive evaluation during that hospitalization and has not been rescheduled since.  We recommended that the neurocognitive evaluation be rescheduled the patient speak with her neurology team about the possibility of starting a cholinesterase inhibitor to help manage her symptoms.  In regards to medications for behavioral disturbance we opted to remain on her current regimen while we adjust Lexapro and reassess potential adjustments in Depakote or Seroquel when patient is able to come for an in-person visit.  Associated Signs/Symptoms: Depression Symptoms:  fatigue, difficulty concentrating, impaired memory, disturbed sleep, (Hypo) Manic Symptoms:  Impulsivity, Labiality of Mood, Anxiety Symptoms:   na Psychotic Symptoms:  Hallucinations: Visual PTSD Symptoms: NA  Past Psychiatric History: Patient has connected with a psychiatrist several decades ago for depressed mood.  She denies any prior psychiatric hospitalizations.  It is unclear whether she had any suicide attempts as her mother did report 1 however her daughter had been unaware of the accuracy.  Her daughter did note her mother tried to throw herself down the stairs sometime in the last year.  Current medications patient has tried Depakote, Seroquel, Risperdal, Haldol, Lexapro, Remeron, Atarax, Ativan, memantine, and Ambien.  Of these patient reports Risperdal was oversedating.  Remeron, Atarax, Ativan, and Ambien were discontinued due to adverse effects in elderly and dementia patient.  Denies any  substance use  Previous Psychotropic Medications: Yes   Substance Abuse History in the last 12 months:  No.  Consequences of Substance Abuse: NA  Past Medical History:  Past Medical History:  Diagnosis Date   Allergic rhinitis    ASCUS (atypical squamous cells of undetermined significance) on Pap smear    Atrophic vaginitis    Chronic insomnia    Depression    GERD (gastroesophageal reflux disease)    Goiter    History of kidney stones    Hypercholesteremia    Hypertension    LGSIL (low grade squamous intraepithelial dysplasia)    Osteopenia    Ovarian cyst    "shrank it"   Pancreatitis    Scoliosis    Spinal arthritis     Past Surgical History:  Procedure Laterality Date   ABDOMINAL HYSTERECTOMY  1977   partial   INTRAMEDULLARY (IM) NAIL INTERTROCHANTERIC Right 11/28/2021   Procedure: INTRAMEDULLARY (IM) NAIL INTERTROCHANTRIC;  Surgeon: Samson Frederic, MD;  Location: [facility];  Service: Orthopedics;  Laterality: Right;  KIDNEY STONE SURGERY  1990's   2-3 stones x1 surgery   ORIF FEMUR FRACTURE Left 06/09/2017   Procedure: OPEN REDUCTION INTERNAL FIXATION (ORIF) DISTAL FEMUR FRACTURE;  Surgeon: Durene Romans, MD;  Location: [facility];  Service: Orthopedics;  Laterality: Left;   TONSILLECTOMY  as teenager   TOTAL KNEE ARTHROPLASTY Left 01/19/2014   Procedure: LEFT TOTAL KNEE ARTHROPLASTY;  Surgeon: Shelda Pal, MD;  Location: [facility];  Service: Orthopedics;  Laterality: Left;   WISDOM TOOTH EXTRACTION      Family Psychiatric History: Family history of Alzheimer's and dementia in her brother.  Family History:  Family History  Problem Relation Age of Onset   Hypertension Father    Cancer Father    Cancer Mother        Bone   Cancer Brother     Social History:   Social History   Socioeconomic History   Marital status: Married    Spouse name: Not on file   Number of children: Not on file   Years of education: Not on file   Highest education level: Not on file   Occupational History   Not on file  Tobacco Use   Smoking status: Former    Years: 20.00    Types: Cigarettes    Quit date: 07/03/1978    Years since quitting: 44.0   Smokeless tobacco: Former  Building services engineer Use: Never used  Substance and Sexual Activity   Alcohol use: Yes    Comment: occ   Drug use: No   Sexual activity: Yes    Birth control/protection: Surgical    Comment: hysterectomy  Other Topics Concern   Not on file  Social History Narrative   Not on file   Social Determinants of Health   Financial Resource Strain: Not on file  Food Insecurity: Not on file  Transportation Needs: Not on file  Physical Activity: Not on file  Stress: Not on file  Social Connections: Not on file    Additional Social History: Patient is retired and has lived with her daughter, son-in-law, granddaughter, and step grandson for the past 5 years.  Patient had been living with her husband prior to that.  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  Allergies:   Allergies  Allergen Reactions   Ambien [Zolpidem] Other (See Comments)    Disorientation Aggression    Aspirin Other (See Comments)    Upsets stomach if 325mg    Codeine Nausea And Vomiting   Namenda [Memantine] Other (See Comments)    Agitation Aggression    Percocet [Oxycodone-Acetaminophen] Other (See Comments)    Disorientation Aggression     Sulfa Antibiotics Nausea Only    Metabolic Disorder Labs: Lab Results  Component Value Date   HGBA1C 4.9 09/06/2020   No results found for: "PROLACTIN" No results found for: "CHOL", "TRIG", "HDL", "CHOLHDL", "VLDL", "LDLCALC" Lab Results  Component Value Date   TSH 3.261 03/12/2022    Therapeutic Level Labs: No results found for: "LITHIUM" No results found for: "CBMZ" Lab Results  Component Value Date   VALPROATE 46 (L) 03/26/2022    Current Medications: Current Outpatient Medications  Medication Sig Dispense Refill   acetaminophen  (TYLENOL) 500 MG tablet Take 2 tablets (1,000 mg total) by mouth 2 (two) times daily. (Patient taking differently: Take 1,000 mg by mouth See admin instructions. 1000 mg twice daily (morning and evening) + an additional 500 mg during the day as needed for pain.) 30  tablet 0   atorvastatin (LIPITOR) 20 MG tablet Take 20 mg by mouth daily.     carboxymethylcellulose (REFRESH PLUS) 0.5 % SOLN Place 1 drop into both eyes in the morning, at noon, and at bedtime.     carvedilol (COREG) 3.125 MG tablet Take 1 tablet (3.125 mg total) by mouth 2 (two) times daily with a meal. 60 tablet 0   divalproex (DEPAKOTE SPRINKLE) 125 MG capsule Take 1 tab morning and afternoon, take 2 tablets evening and bedtime 180 capsule 5   docusate sodium (COLACE) 100 MG capsule Take 100 mg by mouth daily as needed for mild constipation.     escitalopram (LEXAPRO) 10 MG tablet Take 10 mg by mouth daily.     famotidine (PEPCID) 20 MG tablet Take 1 tablet (20 mg total) by mouth daily. 30 tablet 0   Fexofenadine HCl (ALLEGRA ALLERGY PO) Take 1 tablet by mouth daily.     fluticasone (FLONASE) 50 MCG/ACT nasal spray Place 1-2 sprays into both nostrils daily as needed for allergies. (Patient not taking: Reported on 06/19/2022)     MELATONIN PO Take 2 tablets by mouth at bedtime.     Menthol, Topical Analgesic, (BIOFREEZE EX) Apply 1 application  topically 2 (two) times daily as needed (knee pain).     Multiple Vitamins-Minerals (MULTIVITAMIN ADULT) CHEW Chew 1 tablet by mouth daily.     QUEtiapine (SEROQUEL) 50 MG tablet Take 0.5 tablets (25 mg total) by mouth every evening AND 1.5 tablets (75 mg total) at bedtime. 60 tablet 3   No current facility-administered medications for this visit.    Psychiatric Specialty Exam: Review of Systems  There were no vitals taken for this visit.There is no height or weight on file to calculate BMI.  General Appearance: Casual and Fairly Groomed  Eye Contact:  Fair  Speech:  Clear and Coherent  and Slow  Volume:  Normal  Mood:  Euthymic  Affect:  Congruent  Thought Process:  Disorganized  Orientation:  Other:  Oriented to person and the month  Thought Content:  Logical, Illogical, and Hallucinations: Visual  Suicidal Thoughts:  No  Homicidal Thoughts:  No  Memory:  Immediate;   Fair Recent;   Poor Remote;   Poor  Judgement:  Impaired  Insight:  Lacking  Psychomotor Activity:  NA  Concentration:  Concentration: Fair  Recall:  Poor  Fund of Knowledge:Poor  Language: Good  Akathisia:  NA    AIMS (if indicated):  not done  Assets:  Communication Skills Housing Social Support  ADL's:  Impaired  Cognition: Impaired,  Moderate  Sleep:  Fair   Screenings: Mini-Mental    [place] Office Visit from 06/19/2022 in [facility] Office Visit from 02/14/2022 in [facility] Office Visit from 06/07/2021 in [facility] Office Visit from 03/01/2021 in [facility] Office Visit from 09/06/2020 in [facility]  Total Score (max 30 points ) 15 17 21 21 20       [id number]    [place] Visit from 12/10/2017 in Primary Care at [facility] Total Score 0      [place] ED to Hosp-Admission (Discharged) from 03/12/2022 in [place] ED to Hosp-Admission (Discharged) from 02/28/2022 in [place] ED from 02/02/2022 in [facility] Emergency Department at [place] No Risk No Risk No Risk  Collaboration of Care: Medication Management AEB medication prescription, Primary Care Provider AEB chart review, Psychiatrist AEB chart review, and Other provider involved in patient's care [place] neurology chart review and communication  Patient/Guardian was advised Release of Information must be obtained prior to any record release in order to collaborate their  care with an outside provider. Patient/Guardian was advised if they have not already done so to contact the registration department to sign all necessary forms in order for [place] to release information regarding their care.   Consent: Patient/Guardian gives verbal consent for treatment and assignment of benefits for services provided during this visit. Patient/Guardian expressed understanding and agreed to proceed.   Vista Mink, MD 1/19/202411:07 AM   Virtual Visit via Video Note  I connected with Donn Pierini on 07/21/22 at 10:00 AM EST by a video enabled telemedicine application and verified that I am speaking with the correct person using two identifiers.  Location: Patient: Home Provider: Home office   I discussed the limitations of evaluation and management by telemedicine and the availability of in person appointments. The patient expressed understanding and agreed to proceed.   I discussed the assessment and treatment plan with the patient. The patient was provided an opportunity to ask questions and all were answered. The patient agreed with the plan and demonstrated an understanding of the instructions.   The patient was advised to call back or seek an in-person evaluation if the symptoms worsen or if the condition fails to improve as anticipated.  I provided 60 minutes of non-face-to-face time during this encounter.   Vista Mink, MD

## 2022-07-26 DIAGNOSIS — M810 Age-related osteoporosis without current pathological fracture: Secondary | ICD-10-CM | POA: Diagnosis not present

## 2022-07-26 DIAGNOSIS — Z01419 Encounter for gynecological examination (general) (routine) without abnormal findings: Secondary | ICD-10-CM | POA: Diagnosis not present

## 2022-07-26 DIAGNOSIS — N898 Other specified noninflammatory disorders of vagina: Secondary | ICD-10-CM | POA: Diagnosis not present

## 2022-07-26 DIAGNOSIS — R300 Dysuria: Secondary | ICD-10-CM | POA: Diagnosis not present

## 2022-07-26 DIAGNOSIS — L259 Unspecified contact dermatitis, unspecified cause: Secondary | ICD-10-CM | POA: Diagnosis not present

## 2022-07-27 ENCOUNTER — Other Ambulatory Visit: Payer: Self-pay | Admitting: Obstetrics and Gynecology

## 2022-07-27 DIAGNOSIS — Z1231 Encounter for screening mammogram for malignant neoplasm of breast: Secondary | ICD-10-CM

## 2022-08-24 ENCOUNTER — Ambulatory Visit: Payer: Medicare Other

## 2022-08-28 ENCOUNTER — Encounter (HOSPITAL_COMMUNITY): Payer: Self-pay | Admitting: Psychiatry

## 2022-08-28 ENCOUNTER — Ambulatory Visit (HOSPITAL_BASED_OUTPATIENT_CLINIC_OR_DEPARTMENT_OTHER): Payer: Medicare Other | Admitting: Psychiatry

## 2022-08-28 DIAGNOSIS — F339 Major depressive disorder, recurrent, unspecified: Secondary | ICD-10-CM | POA: Diagnosis not present

## 2022-08-28 DIAGNOSIS — F03918 Unspecified dementia, unspecified severity, with other behavioral disturbance: Secondary | ICD-10-CM | POA: Diagnosis not present

## 2022-08-28 MED ORDER — MIRTAZAPINE 15 MG PO TABS: 7.5000 mg | ORAL_TABLET | Freq: Every day | ORAL | 1 refills | Status: DC

## 2022-08-28 MED ORDER — ESCITALOPRAM OXALATE 10 MG PO TABS: 15.0000 mg | ORAL_TABLET | Freq: Every day | ORAL | 2 refills | Status: DC

## 2022-08-28 NOTE — Progress Notes (Signed)
BH MD/PA/NP OP Progress Note  08/28/2022 4:08 PM SONIE OMEROVIC  MRN:  [id number]  Visit Diagnosis:    ICD-10-CM   1. Dementia with behavioral disturbance ([place])  F03.918     2. Recurrent major depressive disorder, remission status unspecified (HCC)  F33.9 escitalopram (LEXAPRO) 10 MG tablet    mirtazapine (REMERON) 15 MG tablet      Assessment: TIFFINY BJELLAND is a 89 y.o. female with a history of depression, dementia with behavioral disturbance, chronic IDA, HLD, CKD stage 3 who presented to [place] [facility] for initial evaluation on 07/21/2022.  At initial evaluation patient reported symptoms of poor memory, visual hallucinations, behavioral disturbance, and depression.  During initial exam patient was found to only be oriented to person and month, while she was unsure of the day, year, and her location.  She was noted to be disorganized and had difficulty answering questions coherently at times.  Collateral obtained from the daughter confirmed that patient has had gradually worsening cognitive function and memory with a notable decline following her hospitalization in August 2023.  Patient has been experiencing episodes of sundowning and delirium in the afternoon/evenings.  Patient is also experiencing visual hallucinations that began after the memory decline.  There is a family history of Alzheimer's and the patient's brother.  Of note patient's MMSE on 06/19/22 was 15 and has been trending down over the past year.  Head CT from September 2023 showed recovering subdural hematoma, subacute anterior left frontal infarct, underlying atrophy with chronic small vessel ischemic change, and recovering periorbital hematoma.  At this time patient meets criteria for dementia with behavioral disturbance and MDD.  The specific nature of her dementia is unclear though there are signs consistent with Lewy body dementia.  We would recommend continuing to coordinate with neurology to  discuss neurocognitive testing and possible addition of cholinesterase inhibitor medications to help modulate dementia symptoms.  Alda Lea presents for follow-up evaluation. Today, 08/28/22, patient reports depressed mood due to increased isolation and inverted sleep cycle.  Patient's daughter corroborated this.  Patient was encouraged to try to sleep at night and stay awake during the days and strategies were discussed to help facilitate this.  We also will start patient on mirtazapine 7.5 mg and risks and benefits were discussed.  The patient's daughter plans to install a bed rails later today and is in the process of getting bed alarms.  They were encouraged to continue to follow neurology and will meet with this provider in 2 months.  Plan: - Continue Seroquel 100 mg QHS - Continue Depakote 125 mg QID - Continue Lexapro to 15 mg QD - Start Remeron 7.5 mg QHS - CMP, CBC, UA, Depakote level reviewed - CT head w/o contrast from 03/24/22 reviewed - Crisis resources reviewed - Follow up in 2 months    Chief Complaint:  Chief Complaint  Patient presents with   Follow-up   HPI: [name] presented alongside her daughter.  Patient's daughter spoke for the majority of the session though Jaliyah did endorse that she can feel depressed at times.  [name] notes that she can feel lonely and isolated.  She also can get frustrated that people are treating her like she is a child for instance in helping her to get dressed.  Provided support and encouragement to the patient in regards to these areas.  Also discussed some strategies to try and help alleviate some of these symptoms.  1 thing discussed was the possibility of  a senior center which patient's daughter notes that they have looked into.  It was also noted that Annamae has slept during the days and stayed up most of the night.  She lists this occurring as it was her routine ever since she no longer had to get up for work.  We explained that if she  were to sleep at night and stay up during the days it would make it easier for her family to spend more time with her.  Patient acknowledged that it was difficult for them to stay up with her at night since they had to go to work the next day.  Patient's daughter reports that Angella had been difficult for the first 3 weeks since her last appointment with some improvement this last week as far as her mood goes.  She has part of this was related to the bed piece as Oktober had been sleeping during the days and staying up all night.  We discussed this with the patient's daughter and explored ways to help shift this back to sleeping at night.  1 option discussed was medication mirtazapine or doxepin.  Patient's daughter notes that she had been on mirtazapine before which had been stopped while she was in the hospital.  We explained that this is likely due to the risk of increasing steadiness.  This could be in large part secondary to the increased sedation from the medication.  After discussing the risk and benefits of this both patient and her daughter were open to retrying mirtazapine to see if it would help with sleep.  Had discussed Depakote and Seroquel as well however patient does not seem to find either sedating and had negative responses when doses were increased.  Patient's daughter also notes that she will be installing bed rails toward mother's bed later today and that she does still plan to get an alarm for when her mom gets up.  Support/education was provided to both the patient and her daughter in regards to dementia and its progression.  They were encouraged to continue to follow with the neurologist.  Past Psychiatric History: Patient has connected with a psychiatrist several decades ago for depressed mood.  She denies any prior psychiatric hospitalizations.  It is unclear whether she had any suicide attempts as her mother did report 1 however her daughter had been unaware of the accuracy.  Her  daughter did note her mother tried to throw herself down the stairs sometime in the last year.  Medications patient has tried Depakote, Seroquel, Risperdal, Haldol, Lexapro, Remeron, Atarax, Ativan, memantine, and Ambien.  Of these patient reports Risperdal was oversedating.  Remeron, Atarax, Ativan, and Ambien were discontinued due to adverse effects in elderly and dementia patient.  Denies any substance use  Past Medical History:  Past Medical History:  Diagnosis Date   Allergic rhinitis    ASCUS (atypical squamous cells of undetermined significance) on Pap smear    Atrophic vaginitis    Chronic insomnia    Depression    GERD (gastroesophageal reflux disease)    Goiter    History of kidney stones    Hypercholesteremia    Hypertension    LGSIL (low grade squamous intraepithelial dysplasia)    Osteopenia    Ovarian cyst    "shrank it"   Pancreatitis    Scoliosis    Spinal arthritis     Past Surgical History:  Procedure Laterality Date   ABDOMINAL HYSTERECTOMY  1977   partial   INTRAMEDULLARY (IM) NAIL  INTERTROCHANTERIC Right 11/28/2021   Procedure: INTRAMEDULLARY (IM) NAIL INTERTROCHANTRIC;  Surgeon: Rod Can, MD;  Location: [facility];  Service: Orthopedics;  Laterality: Right;   KIDNEY STONE SURGERY  1990's   2-3 stones x1 surgery   ORIF FEMUR FRACTURE Left 06/09/2017   Procedure: OPEN REDUCTION INTERNAL FIXATION (ORIF) DISTAL FEMUR FRACTURE;  Surgeon: Paralee Cancel, MD;  Location: [facility];  Service: Orthopedics;  Laterality: Left;   TONSILLECTOMY  as teenager   TOTAL KNEE ARTHROPLASTY Left 01/19/2014   Procedure: LEFT TOTAL KNEE ARTHROPLASTY;  Surgeon: Mauri Pole, MD;  Location: [facility];  Service: Orthopedics;  Laterality: Left;   WISDOM TOOTH EXTRACTION      Family History:  Family History  Problem Relation Age of Onset   Hypertension Father    Cancer Father    Cancer Mother        Bone   Cancer Brother     Social History:  Social History   Socioeconomic  History   Marital status: Married    Spouse name: Not on file   Number of children: Not on file   Years of education: Not on file   Highest education level: Not on file  Occupational History   Not on file  Tobacco Use   Smoking status: Former    Years: 20.00    Types: Cigarettes    Quit date: 07/03/1978    Years since quitting: 44.1   Smokeless tobacco: Former  Scientific laboratory technician Use: Never used  Substance and Sexual Activity   Alcohol use: Yes    Comment: occ   Drug use: No   Sexual activity: Yes    Birth control/protection: Surgical    Comment: hysterectomy  Other Topics Concern   Not on file  Social History Narrative   Not on file   Social Determinants of Health   Financial Resource Strain: Not on file  Food Insecurity: Not on file  Transportation Needs: Not on file  Physical Activity: Not on file  Stress: Not on file  Social Connections: Not on file    Allergies:  Allergies  Allergen Reactions   Ambien [Zolpidem] Other (See Comments)    Disorientation Aggression    Aspirin Other (See Comments)    Upsets stomach if '325mg'$    Codeine Nausea And Vomiting   Namenda [Memantine] Other (See Comments)    Agitation Aggression    Percocet [Oxycodone-Acetaminophen] Other (See Comments)    Disorientation Aggression     Sulfa Antibiotics Nausea Only    Current Medications: Current Outpatient Medications  Medication Sig Dispense Refill   mirtazapine (REMERON) 15 MG tablet Take 0.5 tablets (7.5 mg total) by mouth at bedtime. 30 tablet 1   acetaminophen (TYLENOL) 500 MG tablet Take 2 tablets (1,000 mg total) by mouth 2 (two) times daily. (Patient taking differently: Take 1,000 mg by mouth See admin instructions. 1000 mg twice daily (morning and evening) + an additional 500 mg during the day as needed for pain.) 30 tablet 0   atorvastatin (LIPITOR) 20 MG tablet Take 20 mg by mouth daily.     carboxymethylcellulose (REFRESH PLUS) 0.5 % SOLN Place 1 drop into both eyes in  the morning, at noon, and at bedtime.     carvedilol (COREG) 3.125 MG tablet Take 1 tablet (3.125 mg total) by mouth 2 (two) times daily with a meal. 60 tablet 0   divalproex (DEPAKOTE SPRINKLE) 125 MG capsule Take 1 tab morning and afternoon, take 2 tablets evening and bedtime 180  capsule 5   docusate sodium (COLACE) 100 MG capsule Take 100 mg by mouth daily as needed for mild constipation.     escitalopram (LEXAPRO) 10 MG tablet Take 1.5 tablets (15 mg total) by mouth daily. 45 tablet 2   famotidine (PEPCID) 20 MG tablet Take 1 tablet (20 mg total) by mouth daily. 30 tablet 0   Fexofenadine HCl (ALLEGRA ALLERGY PO) Take 1 tablet by mouth daily.     fluticasone (FLONASE) 50 MCG/ACT nasal spray Place 1-2 sprays into both nostrils daily as needed for allergies. (Patient not taking: Reported on 06/19/2022)     MELATONIN PO Take 2 tablets by mouth at bedtime.     Menthol, Topical Analgesic, (BIOFREEZE EX) Apply 1 application  topically 2 (two) times daily as needed (knee pain).     Multiple Vitamins-Minerals (MULTIVITAMIN ADULT) CHEW Chew 1 tablet by mouth daily.     QUEtiapine (SEROQUEL) 50 MG tablet Take 0.5 tablets (25 mg total) by mouth every evening AND 1.5 tablets (75 mg total) at bedtime. 60 tablet 3   No current facility-administered medications for this visit.     Musculoskeletal: Strength & Muscle Tone: decreased Gait & Station:  Wheelchair bound Patient leans: N/A  Psychiatric Specialty Exam: Review of Systems  There were no vitals taken for this visit.There is no height or weight on file to calculate BMI.  General Appearance: Disheveled and Fairly Groomed  Eye Contact:  Good  Speech:  Normal Rate  Volume:  Normal  Mood:  Depressed and Euthymic  Affect:  Congruent  Thought Process:  Coherent  Orientation:  Full (Time, Place, and Person)  Thought Content: Vague   Suicidal Thoughts:  No  Homicidal Thoughts:  No  Memory:  Immediate;   Fair Recent;   Poor Remote;   Fair   Judgement:  Impaired  Insight:  Lacking  Psychomotor Activity:  Tremor  Concentration:  Concentration: Fair  Recall:  [place] of Knowledge: Poor  Language: Fair  Akathisia:  No    AIMS (if indicated): not done  Assets:  Communication Skills Desire for Improvement Financial Resources/Insurance Housing Social Support Transportation  ADL's:  Intact  Cognition: Impaired,  Moderate  Sleep:  Fair   Metabolic Disorder Labs: Lab Results  Component Value Date   HGBA1C 4.9 09/06/2020   No results found for: "PROLACTIN" No results found for: "CHOL", "TRIG", "HDL", "CHOLHDL", "VLDL", "LDLCALC" Lab Results  Component Value Date   TSH 3.261 03/12/2022   TSH 0.881 02/28/2022    Therapeutic Level Labs: No results found for: "LITHIUM" Lab Results  Component Value Date   VALPROATE 46 (L) 03/26/2022   No results found for: "CBMZ"   Screenings: [place] Office Visit from 06/19/2022 in [facility] Office Visit from 02/14/2022 in [facility] Office Visit from 06/07/2021 in [facility] Office Visit from 03/01/2021 in [facility] Office Visit from 09/06/2020 in [facility]  Total Score (max 30 points ) '15 17 21 21 20      '$ PHQ2-9    [place] Visit from 12/10/2017 in [facility] at [facility] Total Score 0      [place] ED to Hosp-Admission (Discharged) from 03/12/2022 in [place] ED to Hosp-Admission (Discharged) from 02/28/2022 in [place] ED from 02/02/2022 in [facility] Emergency Department at [place]  CATEGORY No Risk No Risk No Risk       Collaboration of Care: Collaboration of Care: Medication Management AEB medication prescription  Patient/Guardian was advised Release of Information must be obtained prior to any  record release in order to collaborate their care with an outside provider. Patient/Guardian was advised if they have not already done so to contact the registration department to sign all necessary forms in order for [place] to release information regarding their care.   Consent: Patient/Guardian gives verbal consent for treatment and assignment of benefits for services provided during this visit. Patient/Guardian expressed understanding and agreed to proceed.    Vista Mink, MD 08/28/2022, 4:08 PM

## 2022-10-05 ENCOUNTER — Ambulatory Visit: Payer: Medicare Other

## 2022-10-16 ENCOUNTER — Encounter: Payer: Self-pay | Admitting: Neurology

## 2022-10-16 ENCOUNTER — Ambulatory Visit: Payer: Medicare Other | Admitting: Neurology

## 2022-10-16 VITALS — BP 116/46 | HR 74 | Ht 65.0 in | Wt 136.0 lb

## 2022-10-16 DIAGNOSIS — F03918 Unspecified dementia, unspecified severity, with other behavioral disturbance: Secondary | ICD-10-CM

## 2022-10-16 NOTE — Progress Notes (Signed)
Subjective:    Patient ID: Nancy Suarez is a 89 y.o. female.  HPI     Interim history:   Nancy Suarez is an 89 year old right-handed woman with an underlying medical history of reflux disease, goiter, hypertension, hyperlipidemia, osteopenia, depression, pancreatitis, spinal arthritis, scoliosis, anemia, chronic kidney disease, anxiety, joint pain and insomnia, who presents for FU consultation of her dementia with behavioral disturbance. The patient is accompanied by her daughter, Nancy Suarez, again today. I last saw her on 03/01/21, at which time her MMSE was 21 out of 30 at the time.  Her daughter reported that patient had recent mood irritability and frustration.  She was on Ambien nightly.  She was cautioned regarding the nightly use of Ambien as she had a recent fall in the night.  She was advised to start Namenda 5 mg daily with gradual increase.    She saw Shawnie Dapper, NP on 06/07/2021, at which time her MMSE was 21.  She had sustained 2 recent falls.  Her PCP had decreased her Ambien to 5 mg and decreased her gabapentin.  She was on hydrocodone twice daily as well as Remeron and Lexapro.  Patient has stopped the memantine in September 2022 after her daughter had noticed behavioral changes.  Patient had a hospital stay for UTI in October 2022.  Ambien was discontinued and she was weaning off of Remeron to Percocet.  She was advised to continue with PT.    She saw Ihor Austin, NP on 02/14/2022 at which time her MMSE was 17.  Her Remeron was increased to 30 mg nightly.  She was referred to neuropsychology and a brain MRI was ordered.    She had a brain MRI without contrast on 03/04/2022 and I reviewed the results:    IMPRESSION: This MRI of the brain without contrast shows the following: 1. Heme products are noted with adjacent edema in the left frontal lobe consistent with a subacute contusion, possibly with some subarachnoid blood.  This corresponds to the finding on the CT scan from 02/28/2022.  A  scalp hematoma is also noted, reduced in size compared to the CT scan. 2. Chronic heme products are also noted in sulci of the right parietal and left occipital lobe.  These are also seen in the MRI from 09/23/2020 consistent with cortical superficial siderosis or prior subarachnoid bleed. 3. Scattered T2/FLAIR hyperintense foci in the hemispheres consistent with mild chronic microvascular ischemic change. 4. Mild to moderate generalized cortical atrophy.  This is similar to the MRI from 09/23/2020.   She saw Ihor Austin, NP on 06/19/2022, at which time patient's daughter reported that she was more agitated.  Her MMSE was 15 out of 30 at the time.  She was referred to behavioral health.  She was advised to continue with Depakote and she was advised to split Seroquel to 25 mg in the morning and 75 mg at night.  In the interim, her daughter was advised to reduce her Depakote.   Today, 10/16/2022: The patient reports very little.  She is able to answer simple questions.  Daughter reports that they recently saw psychiatry and I reviewed the office visit note from 08/28/2022, revisit with Dr. Mercy Riding.  Patient was advised to continue with Remeron, Seroquel 100 mg at night and Depakote 125 mg 4 times a day.  Daughter reports that she has been more or less stable.  Memory function fluctuates.  She is no longer on Namenda but daughter is wondering if it could be tried again.  I also reviewed her visit note with Dr. Mercy Riding from 07/21/2022, Lexapro was increased to 15 mg at the time.   The patient's allergies, current medications, family history, past medical history, past social history, past surgical history and problem list were reviewed and updated as appropriate.    Previously:     I first met her on 09/06/20 at the request of her PCP, at which time she was reported to have forgetfulness for at least 6 months.  Her MMSE was 20/30 at the time. I suggest we proceed with additional blood work and a brain MRI.   Her labs included Hemoglobin A1c, vitamin B6, vitamin D, CRP, rheumatoid factor, B12 and B1.Her rheumatoid factor was positive.  Other test results were benign.  She was notified of the results.We had also talked about medication affecting her cognitive function as she was on multiple medications at the time.SheWas advised to discuss medication management with her primary care physician.She had a brain MRI without contrast on 09/23/2020 and I reviewed the results:   IMPRESSION: Unremarkable MRI scan of the brain without contrast showing only mild age-related changes of chronic small vessel disease and generalized cerebral atrophy.   She was notified of the test results.     09/06/20: (She) reports having difficulty with her memory.  She is unable to provide details of her history and most of her history is provided by her daughter.  Patient has had forgetfulness and mood irritability for the past 6+ months.  She moved in with her daughter who is her only child in late 2020 after her husband passed away.  In the past 6+ months she has had difficulty with her sleep, she tends to be up longer at night, goes to bed around 2 AM or later.  She has had confusion at night and also disorientation, she has had mood irritability and some anger outbursts.  She has not had any obvious hallucinations.  She takes Ambien at night for sleep, this was reduced to 5 mg recently.  She has been on Ambien for years.  She is also on Remeron.   I reviewed your office note from 06/15/2000.  She complained of short-term memory problem and forgetfulness.  Of note, she is on multiple medications including narcotic pain medication in the form of generic Percocet 1 pill every 4 hours as needed, she is also on gabapentin 300 mg 4 times a day, she is on Remeron 15 mg at bedtime and has also been on Ambien, 10 mg strength.  She had blood work through your office on 06/15/2020 and I reviewed the results: Total cholesterol 157, HDL 75,  triglycerides 66, LDL 67, glucose 78, creatinine elevated at 1.54, BUN 19, AST 13, ALT 8, uric acid 5.9, TSH 0.89, CBC with differential showed WBC of 5.2, red blood cell count of 3.35, below normal, hemoglobin low at 9.7, hematocrit low at 29.7. He had a head CT and cervical spine CT without contrast after a fall on 06/06/2017 and I reviewed the results:  IMPRESSION: Head CT: No acute or traumatic finding. Mild age related atrophy and small-vessel ischemic change.   Cervical spine CT: No acute or traumatic finding. Extensive degenerative changes throughout the region as outlined above.   She has not had any falls in the past 6 months.  She has a reasonably good appetite and hydrates with water.   She had a sleep study several years ago and indicates that she was diagnosed with sleep apnea, she was advised to  use a CPAP machine at the time but did not pursue it.  Daughter reports no significant snoring currently.   Patient has a family history of memory loss affecting her brother.  He died at the age of 69, he was 4 years older and had dementia.  Another brother died in his 21s.  Neither parent had memory loss or dementia, father lived to be 95 and mother lived to be 28.  The patient has 1 daughter, she lives with her daughter, son-in-law, granddaughter and step grandson.     She quit smoking in 1980, she does not currently drink any alcohol and does not have a history of heavy alcohol use.  She drinks caffeine in the form of soda, 1 serving per day on average.  She has chronic back pain and arthritis issues.  She has had swelling in both ankles which has become worse in the recent past.   Her Past Medical History Is Significant For: Past Medical History:  Diagnosis Date   Allergic rhinitis    ASCUS (atypical squamous cells of undetermined significance) on Pap smear    Atrophic vaginitis    Chronic insomnia    Depression    GERD (gastroesophageal reflux disease)    Goiter    History of  kidney stones    Hypercholesteremia    Hypertension    LGSIL (low grade squamous intraepithelial dysplasia)    Osteopenia    Ovarian cyst    "shrank it"   Pancreatitis    Scoliosis    Spinal arthritis     Her Past Surgical History Is Significant For: Past Surgical History:  Procedure Laterality Date   ABDOMINAL HYSTERECTOMY  1977   partial   INTRAMEDULLARY (IM) NAIL INTERTROCHANTERIC Right 11/28/2021   Procedure: INTRAMEDULLARY (IM) NAIL INTERTROCHANTRIC;  Surgeon: Samson Frederic, MD;  Location: [facility];  Service: Orthopedics;  Laterality: Right;   KIDNEY STONE SURGERY  1990's   2-3 stones x1 surgery   ORIF FEMUR FRACTURE Left 06/09/2017   Procedure: OPEN REDUCTION INTERNAL FIXATION (ORIF) DISTAL FEMUR FRACTURE;  Surgeon: Durene Romans, MD;  Location: [facility];  Service: Orthopedics;  Laterality: Left;   TONSILLECTOMY  as teenager   TOTAL KNEE ARTHROPLASTY Left 01/19/2014   Procedure: LEFT TOTAL KNEE ARTHROPLASTY;  Surgeon: Shelda Pal, MD;  Location: [facility];  Service: Orthopedics;  Laterality: Left;   WISDOM TOOTH EXTRACTION      Her Family History Is Significant For: Family History  Problem Relation Age of Onset   Hypertension Father    Cancer Father    Cancer Mother        Bone   Cancer Brother     Her Social History Is Significant For: Social History   Socioeconomic History   Marital status: Widowed    Spouse name: Not on file   Number of children: Not on file   Years of education: Not on file   Highest education level: Bachelor's degree (e.g., BA, AB, BS)  Occupational History   Not on file  Tobacco Use   Smoking status: Former    Years: 20    Types: Cigarettes    Quit date: 07/03/1978    Years since quitting: 44.3   Smokeless tobacco: Former  Building services engineer Use: Never used  Substance and Sexual Activity   Alcohol use: Yes    Comment: occ   Drug use: No   Sexual activity: Yes    Birth control/protection: Surgical    Comment: hysterectomy  Other  Topics Concern   Not on file  Social History Narrative   Lives at home with daughter, Nancy Suarez   Right handed   Caffeine: at the most 1 cup/day   Social Determinants of Health   Financial Resource Strain: Not on file  Food Insecurity: Not on file  Transportation Needs: Not on file  Physical Activity: Not on file  Stress: Not on file  Social Connections: Not on file    Her Allergies Are:  Allergies  Allergen Reactions   Ambien [Zolpidem] Other (See Comments)    Disorientation Aggression    Aspirin Other (See Comments)    Upsets stomach if    Codeine Nausea And Vomiting   Namenda [Memantine] Other (See Comments)    Agitation Aggression    Percocet [Oxycodone-Acetaminophen] Other (See Comments)    Disorientation Aggression     Sulfa Antibiotics Nausea Only  :   Her Current Medications Are:  Outpatient Encounter Medications as of 10/16/2022  Medication Sig   acetaminophen (TYLENOL) 500 MG tablet Take 2 tablets (1,000 mg total) by mouth 2 (two) times daily. (Patient taking differently: Take 1,000 mg by mouth See admin instructions. 1000 mg twice daily (morning and evening) + an additional 500 mg during the day as needed for pain.)   atorvastatin (LIPITOR) 20 MG tablet Take 20 mg by mouth daily.   carboxymethylcellulose (REFRESH PLUS) 0.5 % SOLN Place 1 drop into both eyes in the morning, at noon, and at bedtime.   carvedilol (COREG) 3.125 MG tablet Take 1 tablet (3.125 mg total) by mouth 2 (two) times daily with a meal.   divalproex (DEPAKOTE SPRINKLE) 125 MG capsule Take 1 tab morning and afternoon, take 2 tablets evening and bedtime (Patient taking differently: Take 1 tab morning and afternoon, 1 tablet in the evening and 1 tablet at bedtime.)   docusate sodium (COLACE) 100 MG capsule Take 100 mg by mouth daily as needed for mild constipation.   escitalopram (LEXAPRO) 10 MG tablet Take 1.5 tablets (15 mg total) by mouth daily. (Patient taking differently: Take 10 mg by mouth  daily.)   famotidine (PEPCID) 20 MG tablet Take 1 tablet (20 mg total) by mouth daily.   Fexofenadine HCl (ALLEGRA ALLERGY PO) Take 1 tablet by mouth daily.   fluticasone (FLONASE) 50 MCG/ACT nasal spray Place 1-2 sprays into both nostrils daily as needed for allergies.   MELATONIN PO Take 10 mg by mouth at bedtime.   mirtazapine (REMERON) 15 MG tablet Take 0.5 tablets (7.5 mg total) by mouth at bedtime.   Multiple Vitamins-Minerals (MULTIVITAMIN ADULT) CHEW Chew 1 tablet by mouth daily.   QUEtiapine (SEROQUEL) 50 MG tablet Take 0.5 tablets (25 mg total) by mouth every evening AND 1.5 tablets (75 mg total) at bedtime. (Patient taking differently: 50 mg at bedtime but can take 1 tablet in the evening as well)   [DISCONTINUED] Menthol, Topical Analgesic, (BIOFREEZE EX) Apply 1 application  topically 2 (two) times daily as needed (knee pain). (Patient not taking: Reported on 10/16/2022)   No facility-administered encounter medications on file as of 10/16/2022.  :  Review of Systems:  Out of a complete 14 point review of systems, all are reviewed and negative with the exception of these symptoms as listed below:       Review of Systems  Neurological:        Patient is here with her daughter for 4 month memory follow-up. Patient states she has been doing fine. Her daughter denies any changes since last  visit but she does notice that things are rough if the patient doesn't get her medication right on schedule. She is on Depakote 125 mg taking 1 in the morning, 1 mid-day, 1 in the evening and 1 at bedtime. She is also on Mirtazapine and Seroquel.    Objective:  Neurological Exam  Physical Exam Physical Examination:   Vitals:   10/16/22 1333  BP: (!) 116/46  Pulse: 74    General Examination: The patient is a very pleasant 89 y.o. female in no acute distress. She appears well, well-groomed, in transport wheelchair.    HEENT: Normocephalic, atraumatic, pupils are equal, round and reactive  to light, corrective eyeglasses in place, tracking is fair, no obvious nystagmus, face is symmetric.      Chest: Clear to auscultation without wheezing, rhonchi or crackles noted.   Heart: S1+S2+0, regular and normal without murmurs, rubs or gallops noted.    Abdomen: Soft, non-tender and non-distended.  Extremities: There is puffiness noted in the distal lower extremities bilaterally.     Skin: Warm and dry without trophic changes noted.    Musculoskeletal: exam reveals no obvious joint deformities.    Neurologically:  Mental status: The patient is awake, alert and pays attention.  Able to answer simple questions but unable to provide concise history.  History is provided by her daughter.       06/19/2022    2:18 PM 02/14/2022    9:15 AM 06/07/2021    9:03 AM 03/01/2021    8:13 AM 09/06/2020    2:56 PM  MMSE - Mini Mental State Exam  Orientation to time 1 3 3 3 4   Orientation to Place 5 5 5 4 4   Registration 3 3 3 3 3   Attention/ Calculation 0 0 1 1 0  Attention/Calculation-comments   did WORLD    Recall 0 0 1 1 2   Language- name 2 objects 2 1 2 2 2   Language- repeat 0 0 1 1 0  Language- follow 3 step command 2 3 3 3 3   Language- read & follow direction 1 1 1 1 1   Write a sentence 1 1 1 1 1   Copy design 0 0 0 1 0  Total score 15 17 21 21 20     On 09/06/2020: CDT: 4/4, AFT: 8/min.  On 03/01/2021: CDT: 3/4, AFT: 6/min.   Thought process is linear. Mood is normal and affect is normal.  Cranial nerves II - XII are as described above under HEENT exam. Motor exam: Thin bulk, global strength of about 4 out of 5, I did not have her stand or walk for me today, she is in a transport chair.  Romberg is not tested due to safety concerns.    Cerebellar testing: No dysmetria or intention tremor.  Sensory exam: intact to light touch.    Assessment and Plan:  In summary, Nancy Suarez is an 89 year old right-handed woman with an underlying medical history of reflux disease, goiter,  hypertension, hyperlipidemia, osteopenia, depression, pancreatitis, spinal arthritis, scoliosis, anemia, chronic kidney disease, anxiety, joint pain and insomnia, who presents for follow-up consultation of her memory loss of at least 3 years duration with evidence of behavioral disturbances.  She has had agitation, frustration, and confusion.  She has had several medication changes over the past couple years.  She no longer takes hydrocodone and is no longer on Ambien.  She is currently on Remeron, we had started Seroquel and Depakote in the attempt of helping with behavioral escalations, she  has since then established with a psychiatry provider.  They are advised to continue following with psychiatry and I would like to get them to take over her psychotropic medications for optimized management of her behavioral escalations.  I recommend that patient's daughter talk to the psychiatrist at the next appointment about streamlining medications and taking over the prescription for Seroquel and Depakote at the recommended doses from their end of things.  We can certainly consider a trial of memantine again in the future, for now, I recommend maintaining the current prescriptions and following with psychiatry as scheduled.  We will have her come back and see [place] in 3 months.  I had previously avoided Aricept due to bradycardia.  She did not tolerate memantine but we can certainly think about a trial of dementia medication in the near future.  For workup of her memory loss she had blood work in the past and MRI brain from March 2022 showed atrophy which was felt to be mostly age-appropriate.  Her brain MRI from September 2023 showed evidence of an intracranial hemorrhage.  She has fallen.   I plan to see her back in about 3 months, sooner if needed.  I answered all the questions today and the patient and her daughter were in agreement.   I spent 40 minutes in total face-to-face time and in reviewing records during  pre-charting, more than 50% of which was spent in counseling and coordination of care, reviewing test results, reviewing medications and treatment regimen and/or in discussing or reviewing the diagnosis of dementia with behavioral disturbance, the prognosis and treatment options. Pertinent laboratory and imaging test results that were available during this visit with the patient were reviewed by me and considered in my medical decision making (see chart for details).

## 2022-10-16 NOTE — Patient Instructions (Signed)
We will have you continue with your current medications.  I would like for your psychiatrist to advise and let you know if he would able to maintain your prescription for your behavioral symptoms of agitation and frustration. You are on Depakote, mirtazapine and Seroquel, but these are medications best managed by a psychiatry provider.  Please follow up with me in 3 months. I did not make any changes in your medications today. We may consider memantine for your memory loss again at the next appointment.

## 2022-10-30 ENCOUNTER — Ambulatory Visit (HOSPITAL_BASED_OUTPATIENT_CLINIC_OR_DEPARTMENT_OTHER): Payer: Medicare Other | Admitting: Psychiatry

## 2022-10-30 ENCOUNTER — Encounter (HOSPITAL_COMMUNITY): Payer: Self-pay | Admitting: Psychiatry

## 2022-10-30 VITALS — BP 154/71 | HR 71 | Ht 65.0 in | Wt 128.0 lb

## 2022-10-30 DIAGNOSIS — F339 Major depressive disorder, recurrent, unspecified: Secondary | ICD-10-CM

## 2022-10-30 DIAGNOSIS — F03918 Unspecified dementia, unspecified severity, with other behavioral disturbance: Secondary | ICD-10-CM | POA: Diagnosis not present

## 2022-10-30 MED ORDER — ESCITALOPRAM OXALATE 10 MG PO TABS
15.0000 mg | ORAL_TABLET | Freq: Every day | ORAL | 1 refills | Status: DC
Start: 2022-10-30 — End: 2023-02-27

## 2022-10-30 MED ORDER — MIRTAZAPINE 15 MG PO TABS: 7.5000 mg | ORAL_TABLET | Freq: Every day | ORAL | 1 refills | Status: DC

## 2022-10-30 MED ORDER — DIVALPROEX SODIUM 125 MG PO CSDR: 125.0000 mg | DELAYED_RELEASE_CAPSULE | Freq: Four times a day (QID) | ORAL | 1 refills | Status: DC

## 2022-10-30 MED ORDER — QUETIAPINE FUMARATE 50 MG PO TABS: 50.0000 mg | ORAL_TABLET | Freq: Every day | ORAL | 1 refills | Status: DC

## 2022-10-30 NOTE — Progress Notes (Signed)
BH MD/PA/NP OP Progress Note  10/30/2022 3:27 PM CRISTEN BREDESON  MRN:  [id number]  Visit Diagnosis:    ICD-10-CM   1. Dementia with behavioral disturbance (HCC)  F03.918 divalproex (DEPAKOTE SPRINKLE) 125 MG capsule    QUEtiapine (SEROQUEL) 50 MG tablet    2. Recurrent major depressive disorder, remission status unspecified (HCC)  F33.9 mirtazapine (REMERON) 15 MG tablet    escitalopram (LEXAPRO) 10 MG tablet      Assessment: CELESTINA GIRONDA is a 89 y.o. female with a history of depression, dementia with behavioral disturbance, chronic IDA, HLD, CKD stage 3 who presented to [facility] at [facility] for initial evaluation on 07/21/2022.  At initial evaluation patient reported symptoms of poor memory, visual hallucinations, behavioral disturbance, and depression.  During initial exam patient was found to only be oriented to person and month, while she was unsure of the day, year, and her location.  She was noted to be disorganized and had difficulty answering questions coherently at times.  Collateral obtained from the daughter confirmed that patient has had gradually worsening cognitive function and memory with a notable decline following her hospitalization in August 2023.  Patient has been experiencing episodes of sundowning and delirium in the afternoon/evenings.  Patient is also experiencing visual hallucinations that began after the memory decline.  There is a family history of Alzheimer's and the patient's brother.  Of note patient's MMSE on 06/19/22 was 15 and has been trending down over the past year.  Head CT from September 2023 showed recovering subdural hematoma, subacute anterior left frontal infarct, underlying atrophy with chronic small vessel ischemic change, and recovering periorbital hematoma.  At this time patient meets criteria for dementia with behavioral disturbance and MDD.  The specific nature of her dementia is unclear though there are signs consistent with  Lewy body dementia.  We would recommend continuing to coordinate with neurology to discuss neurocognitive testing and possible addition of cholinesterase inhibitor medications to help modulate dementia symptoms.  [name] presents for follow-up evaluation. Today, 10/30/22, patient reports some decline though that appears to be just the past few days with the last couple months overall being improved.  Patient has been compliant with her medications and the only adverse side effect noted has been some increased appetite.  Despite this she has been noted to have some weight loss over the past couple months.  At this point patient is currently stable on her current medication regimen which we will continue.  Patient had just met with a neurologist who opted not to start on medication for dementia at this time, though it is unclear why.  Patient was recommended to reconnect with neurology to discuss this.   Plan: - Continue Seroquel 50 mg QHS - Continue Depakote 125 mg QID - Continue Lexapro to 15 mg QD - Start Remeron 7.5 mg QHS - CMP, CBC, UA, Depakote level reviewed - refer neurology - CT head w/o contrast from 03/24/22 reviewed - Crisis resources reviewed - Follow up in 2 months    Chief Complaint:  Chief Complaint  Patient presents with   Follow-up   HPI: [name] presented alongside her daughter.  [name] presents reporting that things have been up and down over the past month with maybe things being a bit worse.  Her daughter however reports that things had gone very well after the last appointment up until this past weekend.  She notes that Yorley had been sleeping through the night without getting up but  the only negative being some occasional sundowning later in the evening.  Overall however been much more manageable.  They even had travel to [place] with no real disturbance and Rhiannon is sleep or other symptoms.  On Friday however her daughter had to leave and a caretaker had  managed Jayd that evening.  She did not get Marelin to bed and it resulted in her staying up over the night.  This continued over the last couple days before getting better today.  In regards to side effects the only reported side effect Anglea has experienced is some increased appetite.  We discussed continuing on the current regimen which patient was open to.  We also did rereview the recommendation of speaking with the neurology team to discuss the possibility of starting a medication to help with dementia symptoms.  Patient's daughter notes that they recently met with the neurologist however the provider did not want to start on medication at this time.   We reviewed her current presentation and the importance of continuing routine to help keep patient stable behaviorally throughout the day.  Patient's daughter asked about some day programs which we discussed resources.  She is already connected with the [employer].  Patient's daughter also requested that Torii meet separately for her next appointment so she can discuss issues she is having with one of her long-term friends who she is no longer talking to.  Per patient's daughter Russie does not wish to discuss this in front of her daughter or family.  We spoke out this with the patient and she was agreeable to this.  Past Psychiatric History: Patient has connected with a psychiatrist several decades ago for depressed mood.  She denies any prior psychiatric hospitalizations.  It is unclear whether she had any suicide attempts as her mother did report 1 however her daughter had been unaware of the accuracy.  Her daughter did note her mother tried to throw herself down the stairs sometime in the last year.  Medications patient has tried Depakote, Seroquel, Risperdal, Haldol, Lexapro, Remeron, Atarax, Ativan, memantine, and Ambien.  Of these patient reports Risperdal was oversedating.  Remeron, Atarax, Ativan, and Ambien were  discontinued due to adverse effects in elderly and dementia patient.  Denies any substance use  Past Medical History:  Past Medical History:  Diagnosis Date   Allergic rhinitis    ASCUS (atypical squamous cells of undetermined significance) on Pap smear    Atrophic vaginitis    Chronic insomnia    Depression    GERD (gastroesophageal reflux disease)    Goiter    History of kidney stones    Hypercholesteremia    Hypertension    LGSIL (low grade squamous intraepithelial dysplasia)    Osteopenia    Ovarian cyst    "shrank it"   Pancreatitis    Scoliosis    Spinal arthritis     Past Surgical History:  Procedure Laterality Date   ABDOMINAL HYSTERECTOMY  1977   partial   INTRAMEDULLARY (IM) NAIL INTERTROCHANTERIC Right 11/28/2021   Procedure: INTRAMEDULLARY (IM) NAIL INTERTROCHANTRIC;  Surgeon: Samson Frederic, MD;  Location: [facility];  Service: Orthopedics;  Laterality: Right;   KIDNEY STONE SURGERY  1990's   2-3 stones x1 surgery   ORIF FEMUR FRACTURE Left 06/09/2017   Procedure: OPEN REDUCTION INTERNAL FIXATION (ORIF) DISTAL FEMUR FRACTURE;  Surgeon: Durene Romans, MD;  Location: [facility];  Service: Orthopedics;  Laterality: Left;   TONSILLECTOMY  as teenager   TOTAL KNEE  ARTHROPLASTY Left 01/19/2014   Procedure: LEFT TOTAL KNEE ARTHROPLASTY;  Surgeon: Shelda Pal, MD;  Location: [facility];  Service: Orthopedics;  Laterality: Left;   WISDOM TOOTH EXTRACTION      Family History:  Family History  Problem Relation Age of Onset   Hypertension Father    Cancer Father    Cancer Mother        Bone   Cancer Brother     Social History:  Social History   Socioeconomic History   Marital status: Widowed    Spouse name: Not on file   Number of children: Not on file   Years of education: Not on file   Highest education level: Bachelor's degree (e.g., BA, AB, BS)  Occupational History   Not on file  Tobacco Use   Smoking status: Former    Years: 20    Types: Cigarettes     Quit date: 07/03/1978    Years since quitting: 44.3   Smokeless tobacco: Former  Building services engineer Use: Never used  Substance and Sexual Activity   Alcohol use: Yes    Comment: occ   Drug use: No   Sexual activity: Yes    Birth control/protection: Surgical    Comment: hysterectomy  Other Topics Concern   Not on file  Social History Narrative   Lives at home with daughter, Byrd Hesselbach   Right handed   Caffeine: at the most 1 cup/day   Social Determinants of Health   Financial Resource Strain: Not on file  Food Insecurity: Not on file  Transportation Needs: Not on file  Physical Activity: Not on file  Stress: Not on file  Social Connections: Not on file    Allergies:  Allergies  Allergen Reactions   Ambien [Zolpidem] Other (See Comments)    Disorientation Aggression    Aspirin Other (See Comments)    Upsets stomach if 325mg    Codeine Nausea And Vomiting   Namenda [Memantine] Other (See Comments)    Agitation Aggression    Percocet [Oxycodone-Acetaminophen] Other (See Comments)    Disorientation Aggression     Sulfa Antibiotics Nausea Only    Current Medications: Current Outpatient Medications  Medication Sig Dispense Refill   acetaminophen (TYLENOL) 500 MG tablet Take 2 tablets (1,000 mg total) by mouth 2 (two) times daily. (Patient taking differently: Take 1,000 mg by mouth See admin instructions. 1000 mg twice daily (morning and evening) + an additional 500 mg during the day as needed for pain.) 30 tablet 0   atorvastatin (LIPITOR) 20 MG tablet Take 20 mg by mouth daily.     carboxymethylcellulose (REFRESH PLUS) 0.5 % SOLN Place 1 drop into both eyes in the morning, at noon, and at bedtime.     carvedilol (COREG) 3.125 MG tablet Take 1 tablet (3.125 mg total) by mouth 2 (two) times daily with a meal. 60 tablet 0   docusate sodium (COLACE) 100 MG capsule Take 100 mg by mouth daily as needed for mild constipation.     famotidine (PEPCID) 20 MG tablet Take 1 tablet (20  mg total) by mouth daily. 30 tablet 0   Fexofenadine HCl (ALLEGRA ALLERGY PO) Take 1 tablet by mouth daily.     fluticasone (FLONASE) 50 MCG/ACT nasal spray Place 1-2 sprays into both nostrils daily as needed for allergies.     MELATONIN PO Take 10 mg by mouth at bedtime.     Multiple Vitamins-Minerals (MULTIVITAMIN ADULT) CHEW Chew 1 tablet by mouth daily.  divalproex (DEPAKOTE SPRINKLE) 125 MG capsule Take 1 capsule (125 mg total) by mouth in the morning, at noon, in the evening, and at bedtime. Take 1 tab morning and afternoon, take 2 tablets evening and bedtime 360 capsule 1   escitalopram (LEXAPRO) 10 MG tablet Take 1.5 tablets (15 mg total) by mouth daily. 135 tablet 1   mirtazapine (REMERON) 15 MG tablet Take 0.5 tablets (7.5 mg total) by mouth at bedtime. 45 tablet 1   QUEtiapine (SEROQUEL) 50 MG tablet Take 1 tablet (50 mg total) by mouth at bedtime. 90 tablet 1   No current facility-administered medications for this visit.     Musculoskeletal: Strength & Muscle Tone: decreased Gait & Station:  Wheelchair bound Patient leans: N/A  Psychiatric Specialty Exam: Review of Systems  There were no vitals taken for this visit.There is no height or weight on file to calculate BMI.  General Appearance: Disheveled and Fairly Groomed  Eye Contact:  Good  Speech:  Normal Rate  Volume:  Normal  Mood:  Depressed and Euthymic  Affect:  Congruent  Thought Process:  Coherent  Orientation:  Full (Time, Place, and Person)  Thought Content: Vague   Suicidal Thoughts:  No  Homicidal Thoughts:  No  Memory:  Immediate;   Fair Recent;   Poor Remote;   Fair  Judgement:  Impaired  Insight:  Lacking  Psychomotor Activity:  Tremor  Concentration:  Concentration: Fair  Recall:  Fair  Fund of Knowledge: Poor  Language: Fair  Akathisia:  No    AIMS (if indicated): not done  Assets:  Communication Skills Desire for Improvement Financial Resources/Insurance Housing Social  Support Transportation  ADL's:  Intact  Cognition: Impaired,  Moderate  Sleep:  Fair   Metabolic Disorder Labs: Lab Results  Component Value Date   HGBA1C 4.9 09/06/2020   No results found for: "PROLACTIN" No results found for: "CHOL", "TRIG", "HDL", "CHOLHDL", "VLDL", "LDLCALC" Lab Results  Component Value Date   TSH 3.261 03/12/2022   TSH 0.881 02/28/2022    Therapeutic Level Labs: No results found for: "LITHIUM" Lab Results  Component Value Date   VALPROATE 46 (L) 03/26/2022   No results found for: "CBMZ"   Screenings: Mini-Mental    Flowsheet Row Office Visit from 06/19/2022 in [facility] Office Visit from 02/14/2022 in [facility] Office Visit from 06/07/2021 in [facility] Office Visit from 03/01/2021 in [facility] Office Visit from 09[facility]06/2020 in [facility]  Total Score (max 30 points ) 15 17 21 21 20       [id number]    Flowsheet Row Office Visit from 12/10/2017 in [facility] at [facility] Total Score 0      Flowsheet Row ED to Hosp-Admission (Discharged) from 03/12/2022 in [facility] to Hosp-Admission (Discharged) from 02/28/2022 in [facility] ED from 02/02/2022 in [facility] Emergency Department at [facility]  C-SSRS RISK CATEGORY No Risk No Risk No Risk       Collaboration of Care: Collaboration of Care: Medication Management AEB medication prescription  Patient/Guardian was advised Release of Information must be obtained prior to any record release in order to collaborate their care with an outside provider. Patient/Guardian was advised if they have not already done so to contact the registration department to sign all necessary forms in order for [place] to release information regarding their care.   Consent:  Patient/Guardian gives verbal consent for  treatment and assignment of benefits for services provided during this visit. Patient/Guardian expressed understanding and agreed to proceed.    Stasia Cavalier, MD 10/30/2022, 3:27 PM

## 2022-11-09 ENCOUNTER — Ambulatory Visit: Payer: Medicare Other

## 2022-12-11 ENCOUNTER — Ambulatory Visit (HOSPITAL_COMMUNITY): Payer: Medicare Other | Admitting: Psychiatry

## 2022-12-11 ENCOUNTER — Ambulatory Visit: Payer: Medicare Other

## 2022-12-13 ENCOUNTER — Ambulatory Visit (HOSPITAL_BASED_OUTPATIENT_CLINIC_OR_DEPARTMENT_OTHER): Payer: Medicare Other | Admitting: Psychiatry

## 2022-12-13 ENCOUNTER — Encounter (HOSPITAL_COMMUNITY): Payer: Self-pay | Admitting: Psychiatry

## 2022-12-13 ENCOUNTER — Telehealth (HOSPITAL_COMMUNITY): Payer: Self-pay | Admitting: *Deleted

## 2022-12-13 DIAGNOSIS — F339 Major depressive disorder, recurrent, unspecified: Secondary | ICD-10-CM | POA: Diagnosis not present

## 2022-12-13 DIAGNOSIS — F03918 Unspecified dementia, unspecified severity, with other behavioral disturbance: Secondary | ICD-10-CM
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
## 2022-12-13 NOTE — Telephone Encounter (Signed)
Referral for Dementia, memory loss, faxed to [facility].

## 2022-12-14 ENCOUNTER — Encounter: Payer: Self-pay | Admitting: Physician Assistant

## 2022-12-29 ENCOUNTER — Ambulatory Visit: Payer: Medicare Other

## 2022-12-29 ENCOUNTER — Other Ambulatory Visit (INDEPENDENT_AMBULATORY_CARE_PROVIDER_SITE_OTHER): Payer: Medicare Other

## 2022-12-29 ENCOUNTER — Encounter: Payer: Self-pay | Admitting: Physician Assistant

## 2022-12-29 ENCOUNTER — Ambulatory Visit
Admission: RE | Admit: 2022-12-29 | Discharge: 2022-12-29 | Disposition: A | Discharge status: Home / Self Care | Payer: Medicare Other | Source: Ambulatory Visit | Attending: Obstetrics and Gynecology | Admitting: Obstetrics and Gynecology

## 2022-12-29 ENCOUNTER — Ambulatory Visit: Payer: Medicare Other | Admitting: Physician Assistant

## 2022-12-29 VITALS — BP 111/56 | HR 74 | Resp 20 | Ht 65.0 in | Wt 144.0 lb

## 2022-12-29 DIAGNOSIS — Z1231 Encounter for screening mammogram for malignant neoplasm of breast: Secondary | ICD-10-CM

## 2022-12-29 DIAGNOSIS — R413 Other amnesia: Secondary | ICD-10-CM

## 2022-12-29 DIAGNOSIS — F03918 Unspecified dementia, unspecified severity, with other behavioral disturbance: Secondary | ICD-10-CM | POA: Diagnosis not present

## 2022-12-29 LAB — VITAMIN B12: Vitamin B-12: 923 pg/mL — ABNORMAL HIGH (ref 211–911)

## 2022-12-29 LAB — TSH: TSH: 1.22 u[IU]/mL (ref 0.35–5.50)

## 2022-12-29 MED ORDER — MEMANTINE HCL 5 MG PO TABS: ORAL_TABLET | ORAL | 11 refills | Status: AC

## 2022-12-29 NOTE — Patient Instructions (Addendum)
It was a pleasure to see you today at our office.  Recommendations:  Follow Oct 8 at 11:30   Labs today MRI brain   Start Memantine 5 mg tablets.  Take 1 tablet at bedtime for 2 weeks, then 1 tablet twice daily.     For guidance regarding [facility] and if placement were needed at the facility, contact Sidney Ace, Social Worker tel: [phone]   For assessment of decision of mental capacity and competency:  Call Dr. Erick Blinks, geriatric psychiatrist at [phone]  Counseling regarding caregiver distress, including caregiver depression, anxiety and issues regarding community resources, adult day care programs, adult living facilities, or memory care questions:  please contact your  Primary Doctor's Social Worker  Whom to call: Memory  decline, memory medications: Call our office [phone]  For psychiatric meds, mood meds: Please have your primary care physician manage these medications.  If you have any severe symptoms of a stroke, or other severe issues such as confusion,severe chills or fever, etc call 911 or go to the ER as you may need to be evaluated further      RECOMMENDATIONS FOR ALL PATIENTS WITH MEMORY PROBLEMS: 1. Continue to exercise (Recommend 30 minutes of walking everyday, or 3 hours every week) 2. Increase social interactions - continue going to [place] and enjoy social gatherings with friends and family 3. Eat healthy, avoid fried foods and eat more fruits and vegetables 4. Maintain adequate blood pressure, blood sugar, and blood cholesterol level. Reducing the risk of stroke and cardiovascular disease also helps promoting better memory. 5. Avoid stressful situations. Live a simple life and avoid aggravations. Organize your time and prepare for the next day in anticipation. 6. Sleep well, avoid any interruptions of sleep and avoid any distractions in the bedroom that may interfere with adequate sleep quality 7. Avoid sugar, avoid sweets  as there is a strong link between excessive sugar intake, diabetes, and cognitive impairment We discussed the Mediterranean diet, which has been shown to help patients reduce the risk of progressive memory disorders and reduces cardiovascular risk. This includes eating fish, eat fruits and green leafy vegetables, nuts like almonds and hazelnuts, walnuts, and also use olive oil. Avoid fast foods and fried foods as much as possible. Avoid sweets and sugar as sugar use has been linked to worsening of memory function.  There is always a concern of gradual progression of memory problems. If this is the case, then we may need to adjust level of care according to patient needs. Support, both to the patient and caregiver, should then be put into place.    The Alzheimer's Association is here all day, every day for people facing Alzheimer's disease through our free 24/7 Helpline: [phone]. The Helpline provides reliable information and support to all those who need assistance, such as individuals living with memory loss, Alzheimer's or other dementia, caregivers, health care professionals and the public.  Our highly trained and knowledgeable staff can help you with: Understanding memory loss, dementia and Alzheimer's  Medications and other treatment options  General information about aging and brain health  Skills to provide quality care and to find the best care from professionals  Legal, financial and living-arrangement decisions Our Helpline also features: Confidential care consultation provided by master's level clinicians who can help with decision-making support, crisis assistance and education on issues families face every day  Help in a caller's preferred language using our translation service that features more than 200 languages and dialects  Referrals to local community programs, services and ongoing support     FALL PRECAUTIONS: Be cautious when walking. Scan the area for obstacles that  may increase the risk of trips and falls. When getting up in the mornings, sit up at the edge of the bed for a few minutes before getting out of bed. Consider elevating the bed at the head end to avoid drop of blood pressure when getting up. Walk always in a well-lit room (use night lights in the walls). Avoid area rugs or power cords from appliances in the middle of the walkways. Use a walker or a cane if necessary and consider physical therapy for balance exercise. Get your eyesight checked regularly.  FINANCIAL OVERSIGHT: Supervision, especially oversight when making financial decisions or transactions is also recommended.  HOME SAFETY: Consider the safety of the kitchen when operating appliances like stoves, microwave oven, and blender. Consider having supervision and share cooking responsibilities until no longer able to participate in those. Accidents with firearms and other hazards in the house should be identified and addressed as well.   ABILITY TO BE LEFT ALONE: If patient is unable to contact 911 operator, consider using LifeLine, or when the need is there, arrange for someone to stay with patients. Smoking is a fire hazard, consider supervision or cessation. Risk of wandering should be assessed by caregiver and if detected at any point, supervision and safe proof recommendations should be instituted.  MEDICATION SUPERVISION: Inability to self-administer medication needs to be constantly addressed. Implement a mechanism to ensure safe administration of the medications.   DRIVING: Regarding driving, in patients with progressive memory problems, driving will be impaired. We advise to have someone else do the driving if trouble finding directions or if minor accidents are reported. Independent driving assessment is available to determine safety of driving.   If you are interested in the driving assessment, you can contact the following:  [employer] in [place]  [phone]  [facility] [phone]  [facility] [phone] or [phone]      Mediterranean Diet A Mediterranean diet refers to food and lifestyle choices that are based on the traditions of countries located on the [employer]. This way of eating has been shown to help prevent certain conditions and improve outcomes for people who have chronic diseases, like kidney disease and heart disease. What are tips for following this plan? Lifestyle  Cook and eat meals together with your family, when possible. Drink enough fluid to keep your urine clear or pale yellow. Be physically active every day. This includes: Aerobic exercise like running or swimming. Leisure activities like gardening, walking, or housework. Get 7-8 hours of sleep each night. If recommended by your health care provider, drink red wine in moderation. This means 1 glass a day for nonpregnant women and 2 glasses a day for men. A glass of wine equals 5 oz (150 mL). Reading food labels  Check the serving size of packaged foods. For foods such as rice and pasta, the serving size refers to the amount of cooked product, not dry. Check the total fat in packaged foods. Avoid foods that have saturated fat or trans fats. Check the ingredients list for added sugars, such as corn syrup. Shopping  At the grocery store, buy most of your food from the areas near the walls of the store. This includes: Fresh fruits and vegetables (produce). Grains, beans, nuts, and seeds. Some of these may be available in unpackaged forms  or large amounts (in bulk). Fresh seafood. Poultry and eggs. Low-fat dairy products. Buy whole ingredients instead of prepackaged foods. Buy fresh fruits and vegetables in-season from local farmers markets. Buy frozen fruits and vegetables in resealable bags. If you do not have access to quality fresh seafood, buy precooked frozen shrimp or  canned fish, such as tuna, salmon, or sardines. Buy small amounts of raw or cooked vegetables, salads, or olives from the deli or salad bar at your store. Stock your pantry so you always have certain foods on hand, such as olive oil, canned tuna, canned tomatoes, rice, pasta, and beans. Cooking  Cook foods with extra-virgin olive oil instead of using butter or other vegetable oils. Have meat as a side dish, and have vegetables or grains as your main dish. This means having meat in small portions or adding small amounts of meat to foods like pasta or stew. Use beans or vegetables instead of meat in common dishes like chili or lasagna. Experiment with different cooking methods. Try roasting or broiling vegetables instead of steaming or sauteing them. Add frozen vegetables to soups, stews, pasta, or rice. Add nuts or seeds for added healthy fat at each meal. You can add these to yogurt, salads, or vegetable dishes. Marinate fish or vegetables using olive oil, lemon juice, garlic, and fresh herbs. Meal planning  Plan to eat 1 vegetarian meal one day each week. Try to work up to 2 vegetarian meals, if possible. Eat seafood 2 or more times a week. Have healthy snacks readily available, such as: Vegetable sticks with hummus. Greek yogurt. Fruit and nut trail mix. Eat balanced meals throughout the week. This includes: Fruit: 2-3 servings a day Vegetables: 4-5 servings a day Low-fat dairy: 2 servings a day Fish, poultry, or lean meat: 1 serving a day Beans and legumes: 2 or more servings a week Nuts and seeds: 1-2 servings a day Whole grains: 6-8 servings a day Extra-virgin olive oil: 3-4 servings a day Limit red meat and sweets to only a few servings a month What are my food choices? Mediterranean diet Recommended Grains: Whole-grain pasta. Brown rice. Bulgar wheat. Polenta. Couscous. Whole-wheat bread. Orpah Cobb. Vegetables: Artichokes. Beets. Broccoli. Cabbage. Carrots. Eggplant.  Green beans. Chard. Kale. Spinach. Onions. Leeks. Peas. Squash. Tomatoes. Peppers. Radishes. Fruits: Apples. Apricots. Avocado. Berries. Bananas. Cherries. Dates. Figs. Grapes. Lemons. Melon. Oranges. Peaches. Plums. Pomegranate. Meats and other protein foods: Beans. Almonds. Sunflower seeds. Pine nuts. Peanuts. Cod. Salmon. Scallops. Shrimp. Tuna. Tilapia. Clams. Oysters. Eggs. Dairy: Low-fat milk. Cheese. Greek yogurt. Beverages: Water. Red wine. Herbal tea. Fats and oils: Extra virgin olive oil. Avocado oil. Grape seed oil. Sweets and desserts: [place] yogurt with honey. Baked apples. Poached pears. Trail mix. Seasoning and other foods: Basil. Cilantro. Coriander. Cumin. Mint. Parsley. Sage. Rosemary. Tarragon. Garlic. Oregano. Thyme. Pepper. Balsalmic vinegar. Tahini. Hummus. Tomato sauce. Olives. Mushrooms. Limit these Grains: Prepackaged pasta or rice dishes. Prepackaged cereal with added sugar. Vegetables: Deep fried potatoes (french fries). Fruits: Fruit canned in syrup. Meats and other protein foods: Beef. Pork. Lamb. Poultry with skin. Hot dogs. Tomasa Blase. Dairy: Ice cream. Sour cream. Whole milk. Beverages: Juice. Sugar-sweetened soft drinks. Beer. Liquor and spirits. Fats and oils: Butter. Canola oil. Vegetable oil. Beef fat (tallow). Lard. Sweets and desserts: Cookies. Cakes. Pies. Candy. Seasoning and other foods: Mayonnaise. Premade sauces and marinades. The items listed may not be a complete list. Talk with your dietitian about what dietary choices are right for you. Summary The Mediterranean diet includes both  food and lifestyle choices. Eat a variety of fresh fruits and vegetables, beans, nuts, seeds, and whole grains. Limit the amount of red meat and sweets that you eat. Talk with your health care provider about whether it is safe for you to drink red wine in moderation. This means 1 glass a day for nonpregnant women and 2 glasses a day for men. A glass of wine equals 5 oz (150  mL). This information is not intended to replace advice given to you by your health care provider. Make sure you discuss any questions you have with your health care provider. Document Released: 02/10/2016 Document Revised: 03/14/2016 Document Reviewed: 02/10/2016 Elsevier Interactive Patient Education  2017 [employer].    Labs today [address] Mri at [facility] [phone]

## 2022-12-29 NOTE — Progress Notes (Cosign Needed Addendum)
Assessment/Plan:    The patient is seen in neurologic consultation at the request of Corine Shelter, MD for the evaluation of memory.  Nancy Suarez is a very pleasant 89 y.o. year old RH female with a history of hypertension, hyperlipidemia, GERD, osteopenia, OSA not on CPAP, depression, history of pancreatitis, CKD, anemia, insomnia, seen today for evaluation of memory loss.  She carries a diagnosis of dementia with behavioral disturbance from [facility], last seen at that office on 10/16/2022.  MMSE today is 15/30.  She has not been on donepezil due to history of bradycardia.  Memantine was being entertained by [facility], once her mood were to be stabilized. We discuss the pros and cons of starting the medication, as it is not designed to improve or reverse memory loss, however, it may help to slow down memory decline. Daughter would like to try the medicine, I recommend low dose for now, given her age and medical history.     Dementia with behavioral disturbance likely due to Alzheimer's Disease   MRI brain without contrast to assess for underlying structural abnormality and assess vascular load   Start Memantine 5 mg: Take 1 tablet (5 mg at night) for 2 weeks, then increase to 1 tablet (5 mg) twice a day , side effects discussed  Check B12, TSH Recommend good control of cardiovascular risk factors.   Continue to control mood as per psychiatry patient is on Remeron, Seroquel and Depakote. Folllow up in 3 months  Subjective:    The patient is accompanied by her daughter who supplements the history.    How long did patient have memory difficulties?  For at least 3 years per daughter report. Patient has some difficulty remembering recent conversations and people names, including her daughter's . She also "gets lost in time". "My LTM is better than my daughter's ". Enjoys doing crossword puzzles. She enjoys singing and  wants to sing at the [facility] at [place]. repeats oneself?  Endorsed Disoriented  when walking into a room?  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 In today's visit she talks about he r brother as if he is alive.  Leaving objects in unusual places?  Not recently. There was a period in 10/2022 where she was taking the trash out of the trash.  Collects tissues and napkins.   Wandering behavior? denies   Any personality changes ?  She experiences sundowning episodes as well as visual hallucinations beginning after her memory changes. Any history of depression?: Endorsed, lifelong. She cries when reminded of her memory issues  Hallucinations or paranoia? She sees rats and bugs, sometimes she sees people during her sundowns.  Daughter took purse away because she would carry her tissues in it, ad she would be at risk for fall. Psychiatry following.  Seizures? At the hospital after a fall, no recurrence.   Any sleep changes?  Sometimes she does not sleep well. "She is a night owl"-daughter says. She takes melatonin and Seroquel with good response.  Denies vivid dreams, REM behavior or sleepwalking   Sleep apnea Endorsed, not on CPAP Any hygiene concerns? "She has to be reminded, she may fight it"  Independent of bathing and dressing? Needs help with dressing and bathing. Does the patient need help with medications? Daughter  is in charge   Who is in charge of the finances? Daughter is in charge     Any changes in appetite? Eats well. Sometimes she eats  again. Does not drink enough water Patient have trouble swallowing?  no Does the patient cook? No  Any headaches?  denies   Chronic pain?  She has chronic back pain due to arthritis. Ambulates with difficulty?  Needs a walker to ambulate for stability.   Recent falls or head injuries? Endorsed, in 2022 she sustained SDH And possibly SAH as above. No other head injuries  Vision changes? Denies  Unilateral weakness, numbness or tingling? denies   Any tremors? denies   Any anosmia? denies    Any incontinence of urine? Endorsed, wears pads Any bowel dysfunction? denies      Patient lives with her daughter   History of heavy alcohol intake? denies   History of heavy tobacco use? denies   Family history of dementia?  Brother died with dementia  Does patient drive? No  College degree. Retired Runner, broadcasting/film/video     Latest MRI of the brain without contrast on 03/04/2022 are remarkable for hematoma after the fall, at the left frontal lobe with some subarachnoid blood, right parietal and left occipital lobes cortical superficial siderosis versus prior subarachnoid bleed (also seen on MRI 09/23/2020), mild chronic microvascular ischemic changes, and mild to moderate generalized cortical atrophy similar to prior MRI on 09/23/2020.   Allergies  Allergen Reactions   Ambien [Zolpidem] Other (See Comments)    Disorientation Aggression    Aspirin Other (See Comments)    Upsets stomach if 325mg    Codeine Nausea And Vomiting   Namenda [Memantine] Other (See Comments)    Agitation Aggression    Percocet [Oxycodone-Acetaminophen] Other (See Comments)    Disorientation Aggression     Sulfa Antibiotics Nausea Only    Current Outpatient Medications  Medication Instructions   acetaminophen (TYLENOL) 1,000 mg, Oral, 2 times daily   atorvastatin (LIPITOR) 20 mg, Oral, Daily   carboxymethylcellulose (REFRESH PLUS) 0.5 % SOLN 1 drop, Both Eyes, 3 times daily   carvedilol (COREG) 3.125 mg, Oral, 2 times daily with meals   divalproex (DEPAKOTE SPRINKLE) 125 mg, Oral, 4 times daily, Take 1 tab morning and afternoon, take 2 tablets evening and bedtime   docusate sodium (COLACE) 100 mg, Oral, Daily PRN   escitalopram (LEXAPRO) 15 mg, Oral, Daily   famotidine (PEPCID) 20 mg, Oral, Daily   Fexofenadine HCl (ALLEGRA ALLERGY PO) 1 tablet, Oral, Daily   fluticasone (FLONASE) 50 MCG/ACT nasal spray 1-2 sprays, Each Nare, Daily PRN   MELATONIN PO 10 mg, Oral, Daily at bedtime   memantine (NAMENDA) 5 MG tablet  Take 1 tablet (5 mg at night) for 2 weeks, then increase to 1 tablet (5 mg) twice a day   mirtazapine (REMERON) 7.5 mg, Oral, Daily at bedtime   Multiple Vitamins-Minerals (MULTIVITAMIN ADULT) CHEW 1 tablet, Oral, Daily   QUEtiapine (SEROQUEL) 50 mg, Oral, Daily at bedtime     VITALS:   Vitals:   12/29/22 0956  Resp: 20  Height: 5\' 5"  (1.651 m)      PHYSICAL EXAM   HEENT:  Normocephalic, atraumatic.  The superficial temporal arteries are without ropiness or tenderness. Cardiovascular: Regular rate and rhythm. Lungs: Clear to auscultation bilaterally. Neck: There are no carotid bruits noted bilaterally.  NEUROLOGICAL:     No data to display             12/29/2022   11:00 AM 06/19/2022    2:18 PM 02/14/2022    9:15 AM  MMSE - Mini Mental State Exam  Orientation to time 0 1 3  Orientation  to Place 5 5 5   Registration 3 3 3   Attention/ Calculation 0 0 0  Recall 0 0 0  Language- name 2 objects 2 2 1   Language- repeat 1 0 0  Language- follow 3 step command 2 2 3   Language- read & follow direction 1 1 1   Write a sentence 1 1 1   Copy design 0 0 0  Total score 15 15 17      Orientation:  Alert and oriented to person, not to place or time . No aphasia or dysarthria. Fund of knowledge is reduced. Recent and remote memory impaired.  Attention and concentration are reduced . Able to name objects and unable to repeat phrases. Delayed recall 0/3 Cranial nerves: There is good facial symmetry. Extraocular muscles are intact and visual fields are full to confrontational testing. Speech is fluent and clear. No tongue deviation. Hearing is intact to conversational tone.  Tone: Tone is good throughout. Sensation: Sensation is intact to light touch.  Vibration is intact bilaterally Coordination: The patient has some difficulty with RAM's or FNF with her L hand. Abnormal  finger to nose on the L  Motor: Strength is 5/5 in the bilateral upper and lower extremities. There is no pronator  drift. There are no fasciculations noted. DTR's: Deep tendon reflexes are 1/4 bilaterally. Gait and Station: The patient is in a wheelchair, gait unable to be tested.      Thank you for allowing [place] the opportunity to participate in the care of this nice patient. Please do not hesitate to contact us for any questions or concerns.   Total time spent on today's visit was 75 minutes dedicated to this patient today, preparing to see patient, examining the patient, ordering tests and/or medications and counseling the patient, documenting clinical information in the EHR or other health record, independently interpreting results and communicating results to the patient/family, discussing treatment and goals, answering patient's questions and coordinating care.  Cc:  Corine Shelter, MD  Marlowe Kays 12/29/2022 11:32 AM

## 2023-01-01 NOTE — Progress Notes (Signed)
B12 and thyroid are normal, thanks

## 2023-01-16 ENCOUNTER — Ambulatory Visit: Payer: Medicare Other | Admitting: Neurology
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
## 2023-01-25 ENCOUNTER — Encounter (HOSPITAL_COMMUNITY): Payer: Self-pay

## 2023-01-25 ENCOUNTER — Encounter (HOSPITAL_COMMUNITY): Payer: Medicare Other | Admitting: Psychiatry

## 2023-01-25 NOTE — Progress Notes (Signed)
This encounter was created in error - please disregard.

## 2023-01-27 ENCOUNTER — Ambulatory Visit
Admission: RE | Admit: 2023-01-27 | Discharge: 2023-01-27 | Disposition: A | Discharge status: Home / Self Care | Payer: Medicare Other | Source: Ambulatory Visit | Attending: Physician Assistant | Admitting: Physician Assistant

## 2023-01-27 DIAGNOSIS — G319 Degenerative disease of nervous system, unspecified: Secondary | ICD-10-CM | POA: Diagnosis not present

## 2023-01-27 DIAGNOSIS — I6782 Cerebral ischemia: Secondary | ICD-10-CM | POA: Diagnosis not present

## 2023-01-27 DIAGNOSIS — R410 Disorientation, unspecified: Secondary | ICD-10-CM | POA: Diagnosis not present

## 2023-01-27 DIAGNOSIS — G9389 Other specified disorders of brain: Secondary | ICD-10-CM | POA: Diagnosis not present

## 2023-01-28 NOTE — Progress Notes (Signed)
MRI brain without  acute findings . Old contusion changes, and remote bleeding  changes. Some circulation changes, age related, no new strokes, moderate atrophy seen, all these findings may contribute to her memory problems. Continue memantine as directed

## 2023-02-14 IMAGING — CR DG KNEE COMPLETE 4+V*R*
4 series · 4 of 4 positions shown · non-contrast
Comparison: None Available.

CLINICAL DATA: Fell in the bathroom today.  Right knee pain.

EXAM:
RIGHT KNEE - COMPLETE 4+ VIEW

[x knee ap right (1 of 3)]
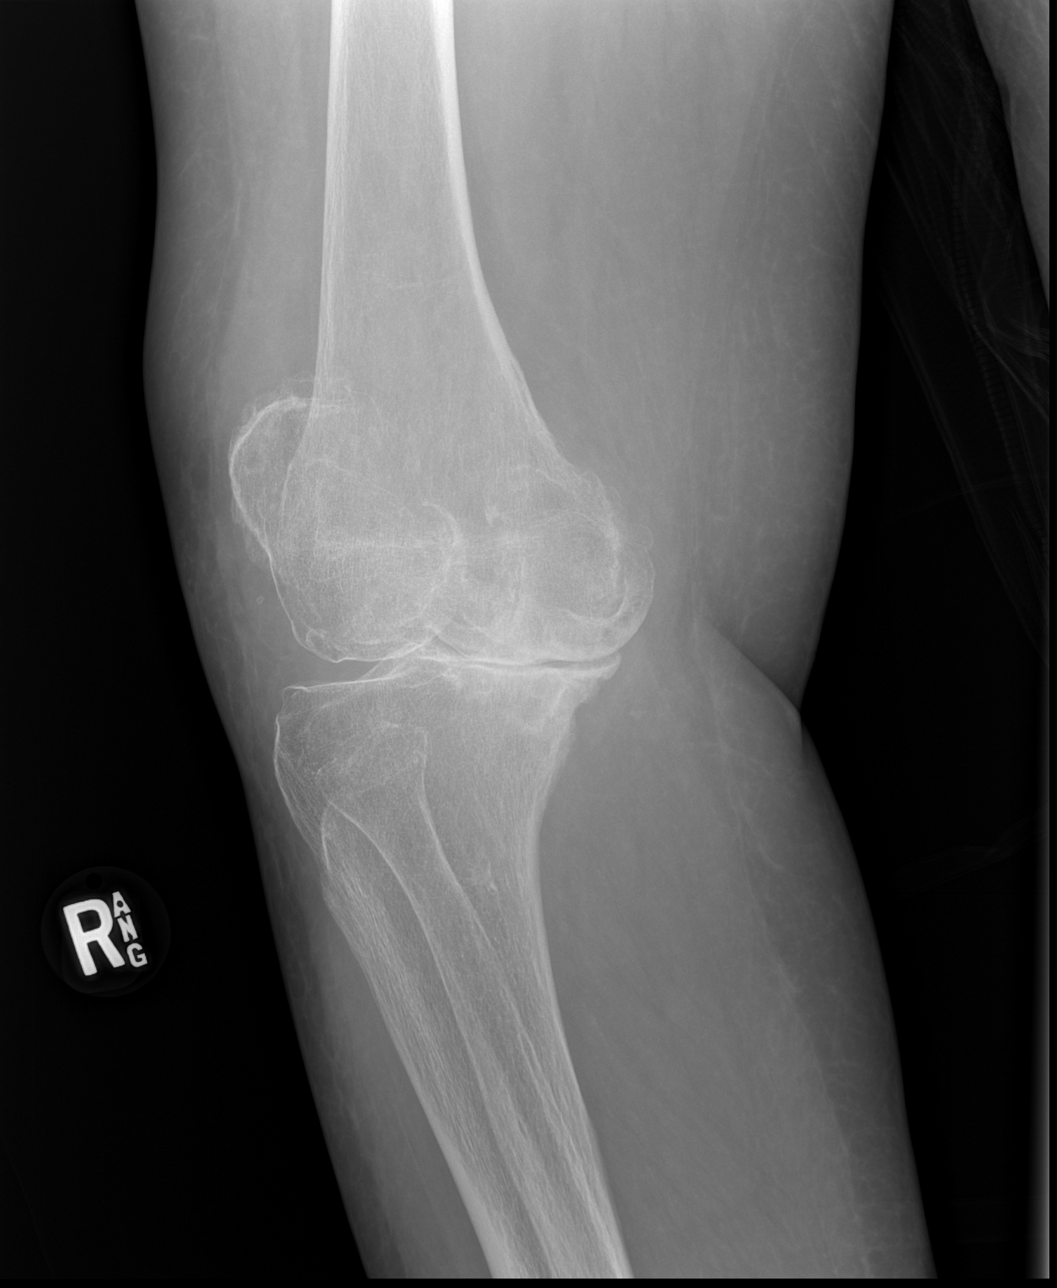

[x knee ap right (2 of 3)]
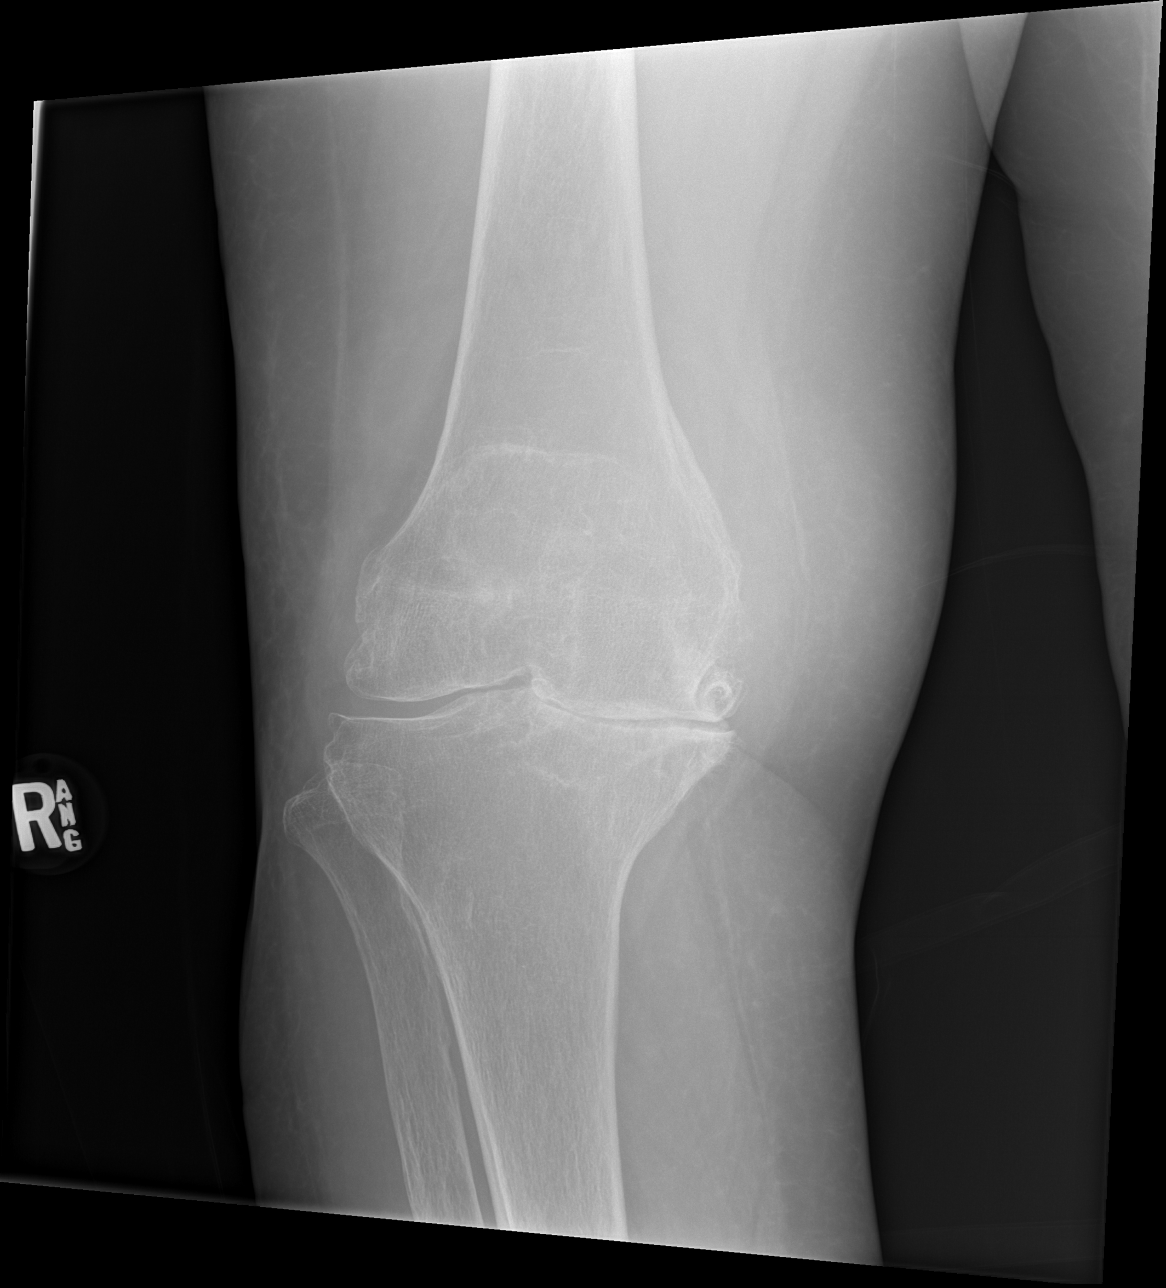

[x knee ap right (3 of 3)]
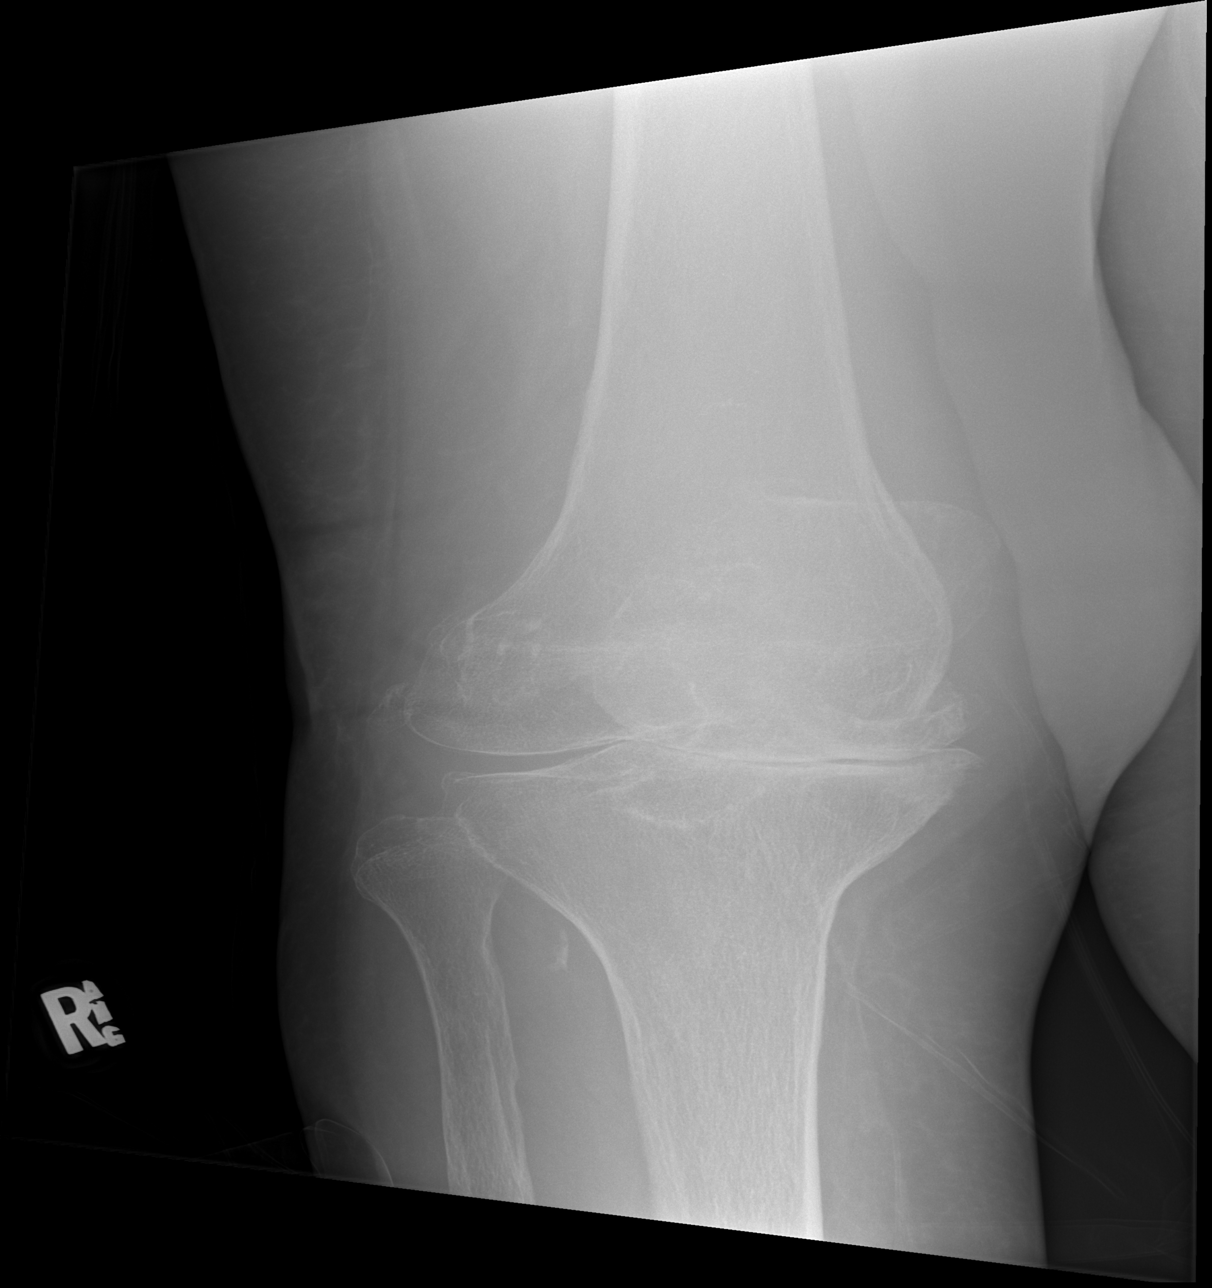

[x knee lat right]
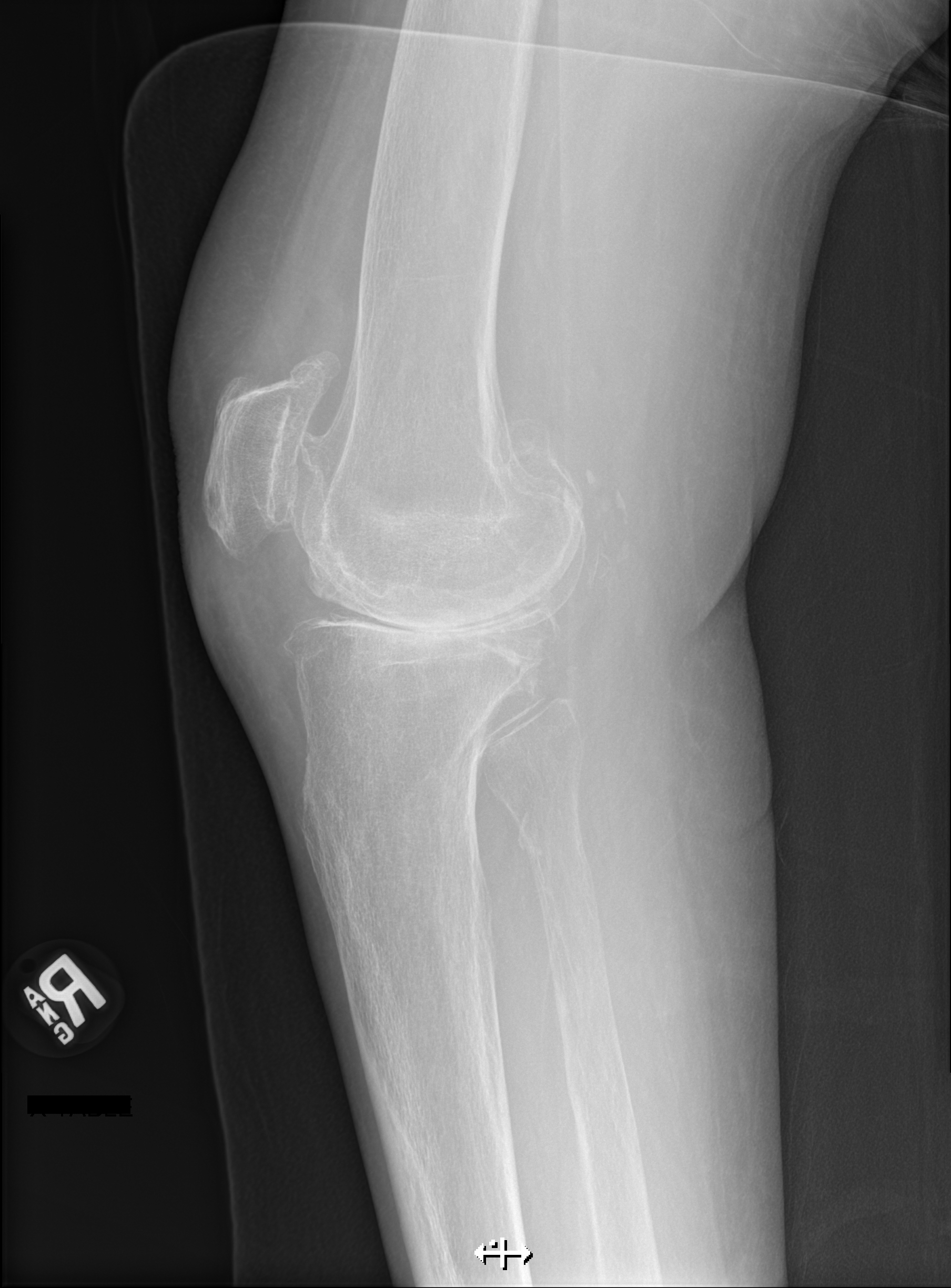

[4 of 4 positions shown; findings below may reference images not displayed]

FINDINGS: No fracture or bone lesion. Skeletal structures are diffusely
demineralized.

Marked narrowing of the medial joint space compartment, milder
narrowing of the lateral and patellofemoral joint space
compartments. Medial compartment subchondral sclerosis. Marginal
osteophytes from all 3 compartments.

No joint effusion.

Surrounding soft tissues are unremarkable.
IMPRESSION: 1. No fracture or acute finding.
2. Advanced osteoarthritis predominantly involving the medial
compartment.

## 2023-02-14 IMAGING — US US RENAL
1 series · 14 of 25 positions shown · non-contrast
Comparison: None Available.

CLINICAL DATA: Acute kidney injury.

EXAM:
RENAL / URINARY TRACT ULTRASOUND COMPLETE

[Series 1: us renal · 14 of 54 slices shown]
[im 1/54]
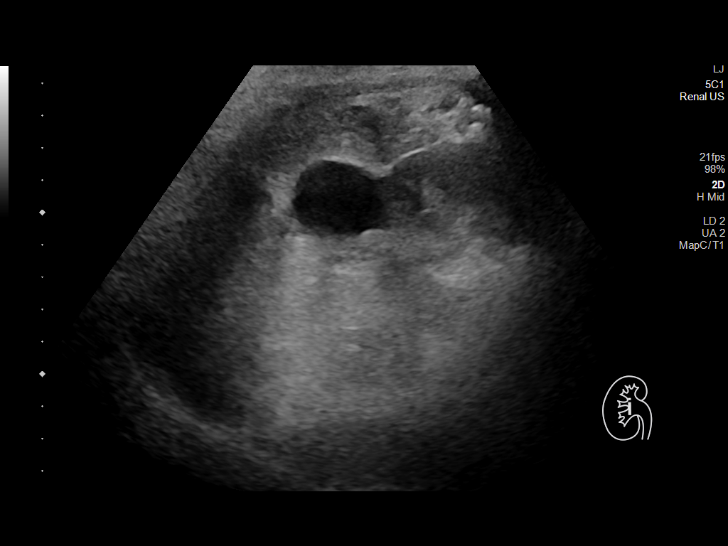
[im 5/54]
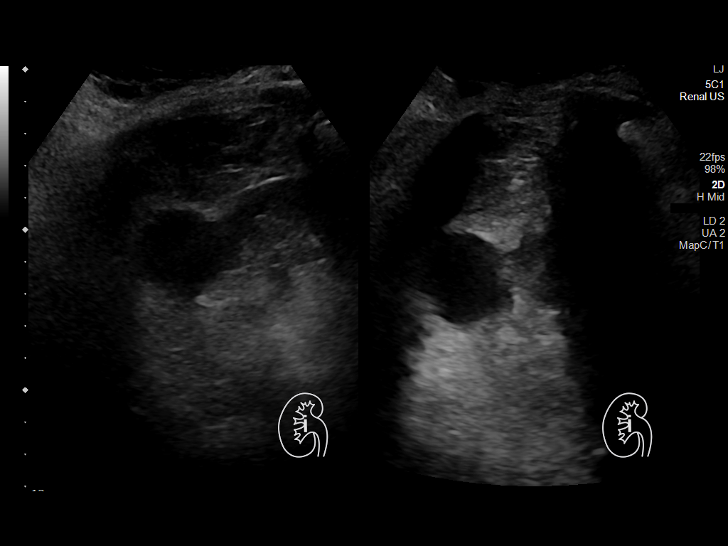
[im 9/54]
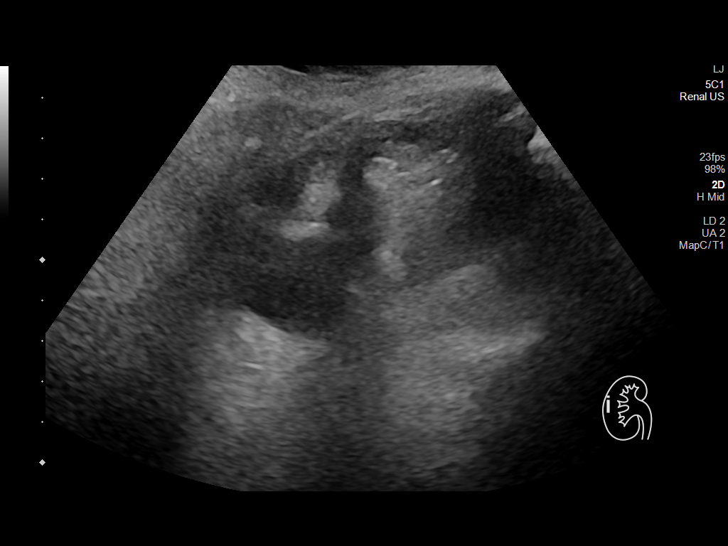
[im 14/54]
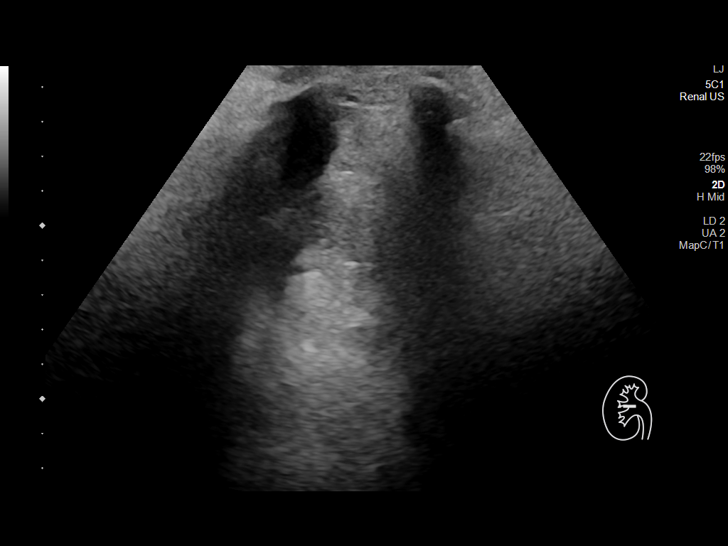
[im 18/54]
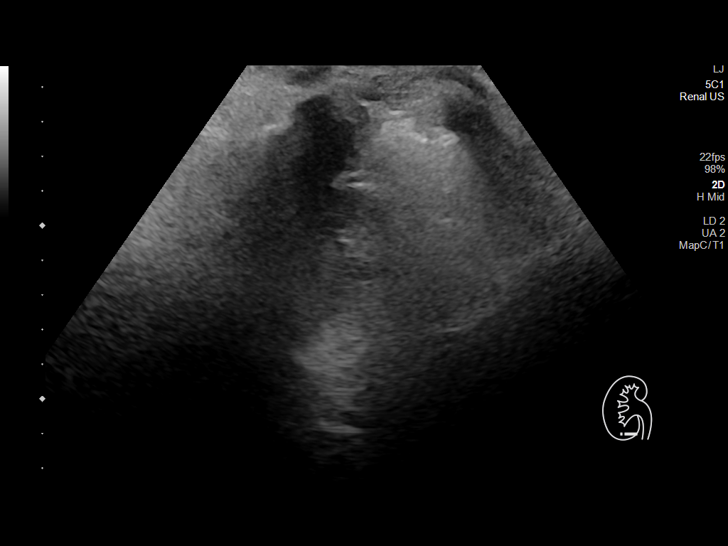
[im 20/54]
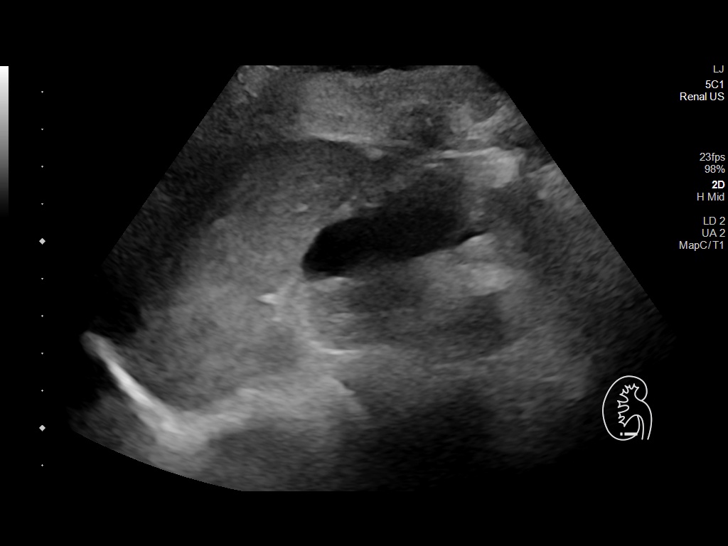
[im 25/54]
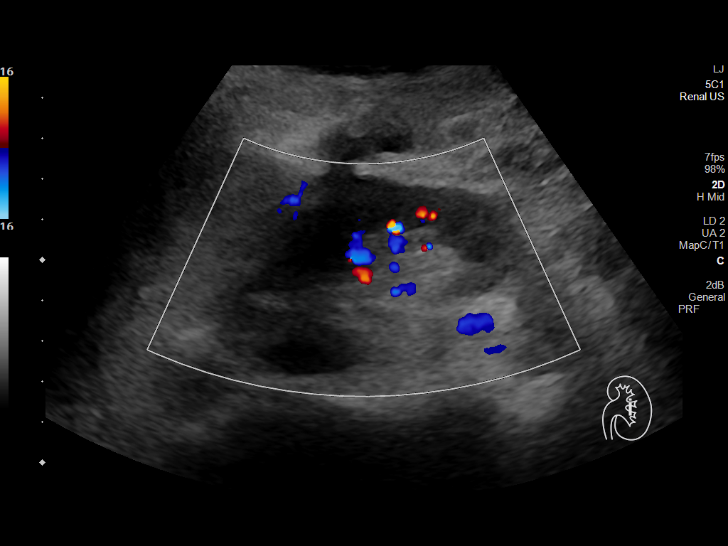
[im 29/54]
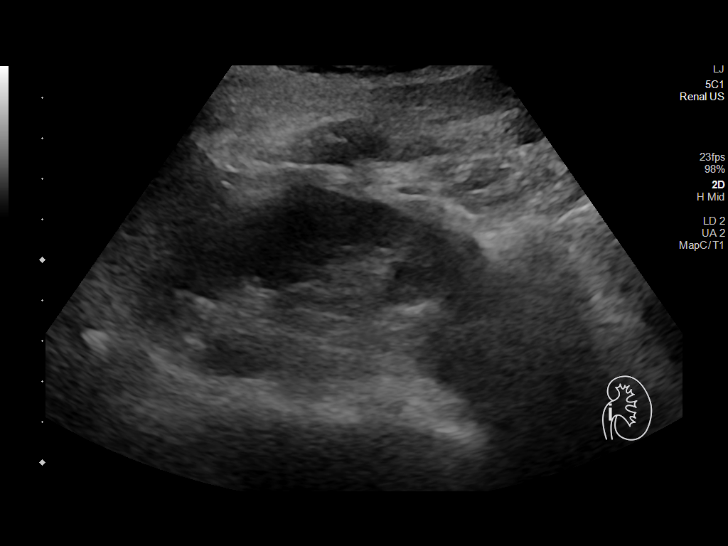
[im 34/54]
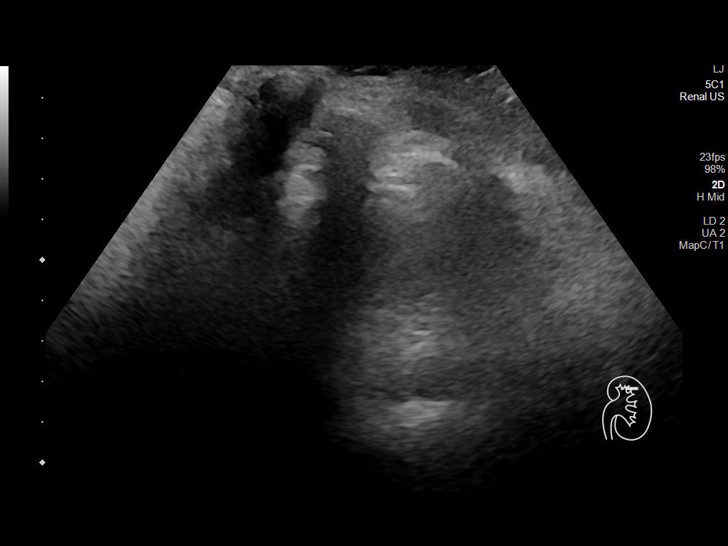
[im 36/54]
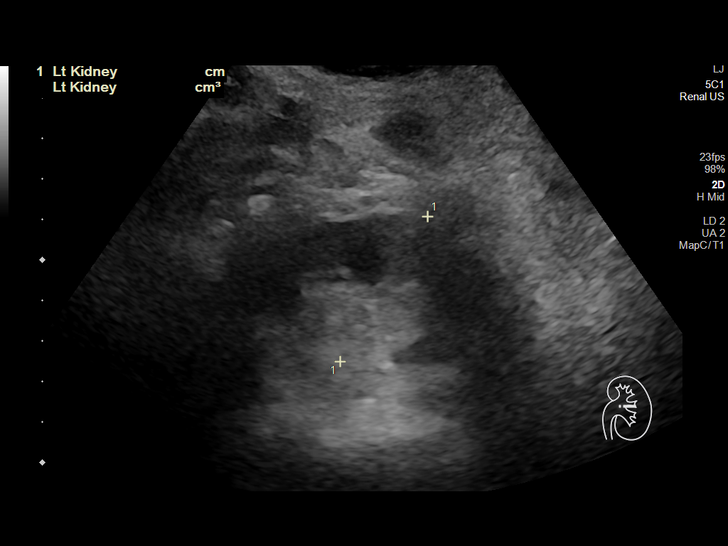
[im 40/54]
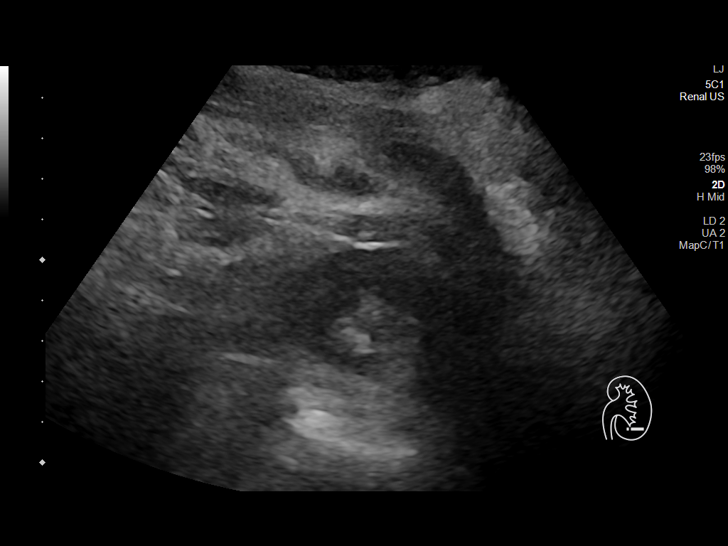
[im 45/54]
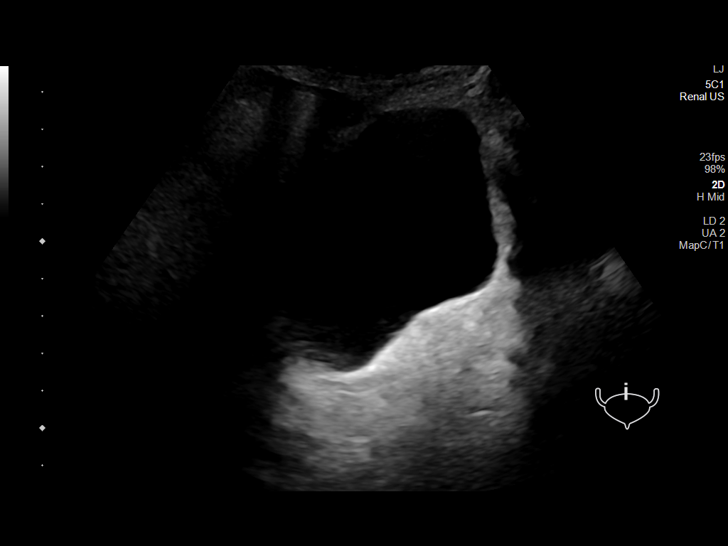
[im 49/54]
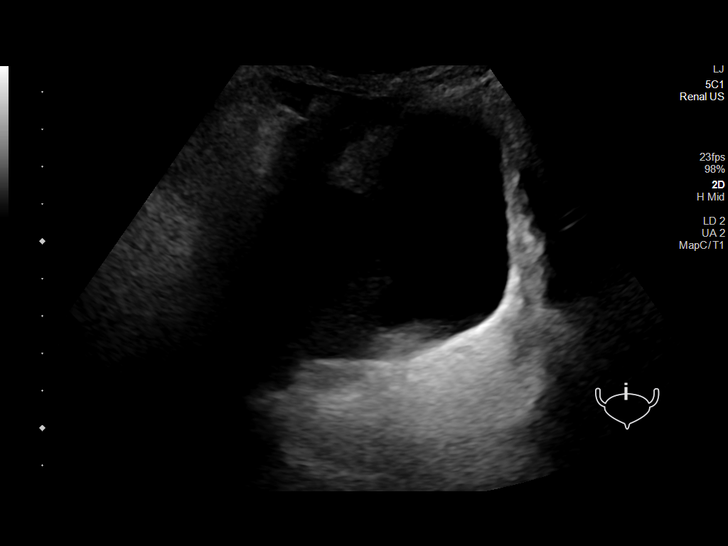
[im 54/54]
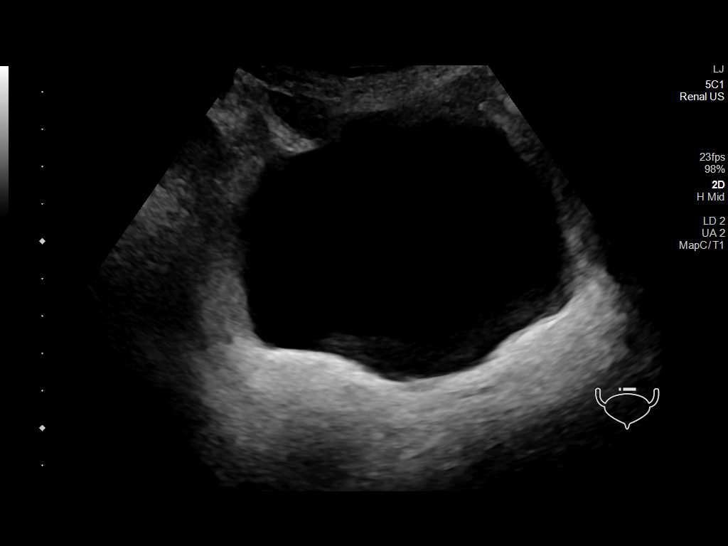

[14 of 25 positions shown; findings below may reference images not displayed]

FINDINGS: Right Kidney:

Renal measurements: 5.2 x 5.1 x 5.6 cm = volume: 137 mL. Limited
visualization. Grossly normal parenchymal echogenicity. No
hydronephrosis. Upper pole cyst measuring 2.8 cm. No other defined
masses.

Left Kidney:

Renal measurements: 7.7 x 4.0 x 4.2 cm = volume: 66.7 mL. Normal
parenchymal echogenicity. Cyst arises from the upper pole, 2.9 cm.
No other masses. No hydronephrosis.

Bladder:

Appears normal for degree of bladder distention.

Other:

None.
IMPRESSION: 1. Limited study due to patient is breathing, inability to move and
bowel gas. Allowing for this, no acute findings. No hydronephrosis.
2. Bilateral small kidneys, left smaller than the right, consistent
with medical renal disease.
3. Single bilateral renal cysts.

## 2023-02-15 IMAGING — RF DG HIP (WITH OR WITHOUT PELVIS) 1V PORT*R*
1 series · 5 of 5 positions shown · non-contrast
Comparison: Radiographs dated November 27, 2021

CLINICAL DATA: Right hip fluoroscopic image for intramedullary nail
placement

EXAM:
DG HIP (WITH OR WITHOUT PELVIS) 1V PORT RIGHT

[Series 1: unknown protocol · 0.20mm/px · 5 of 5 slices shown]
[im 1/5]
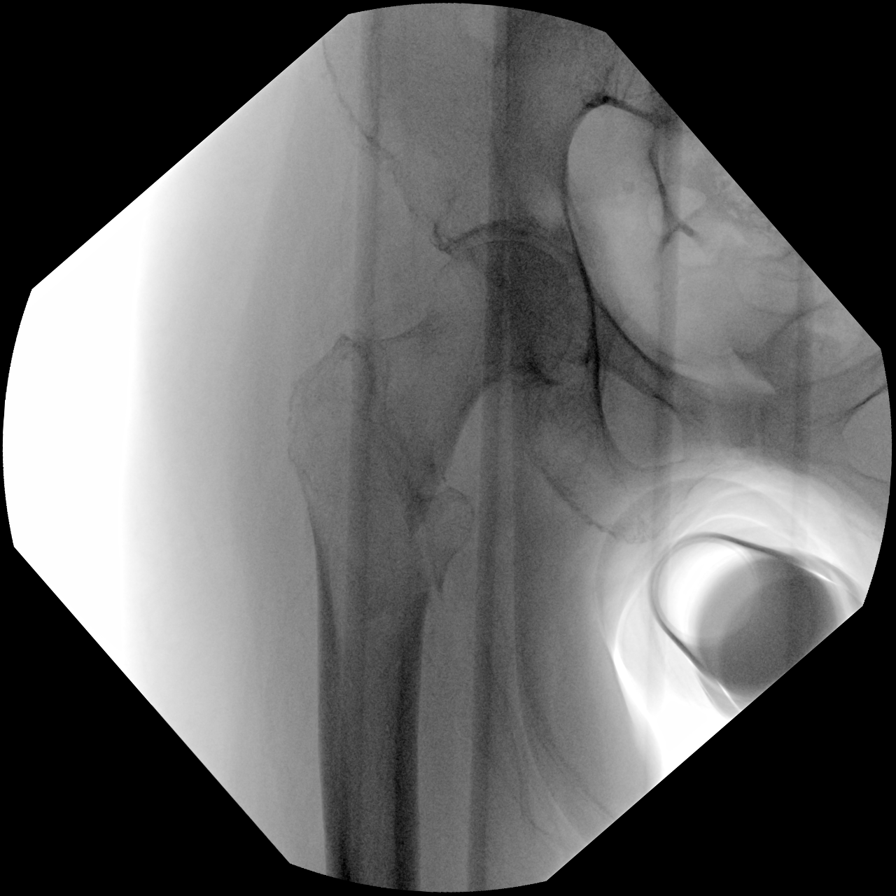
[im 2/5]
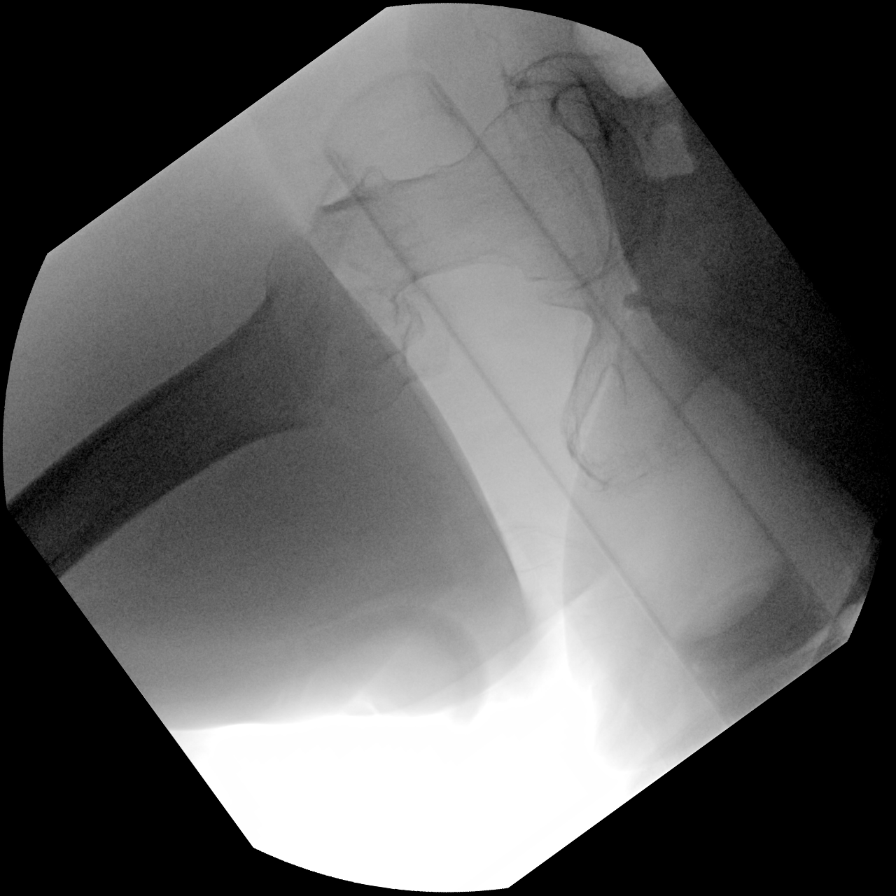
[im 3/5]
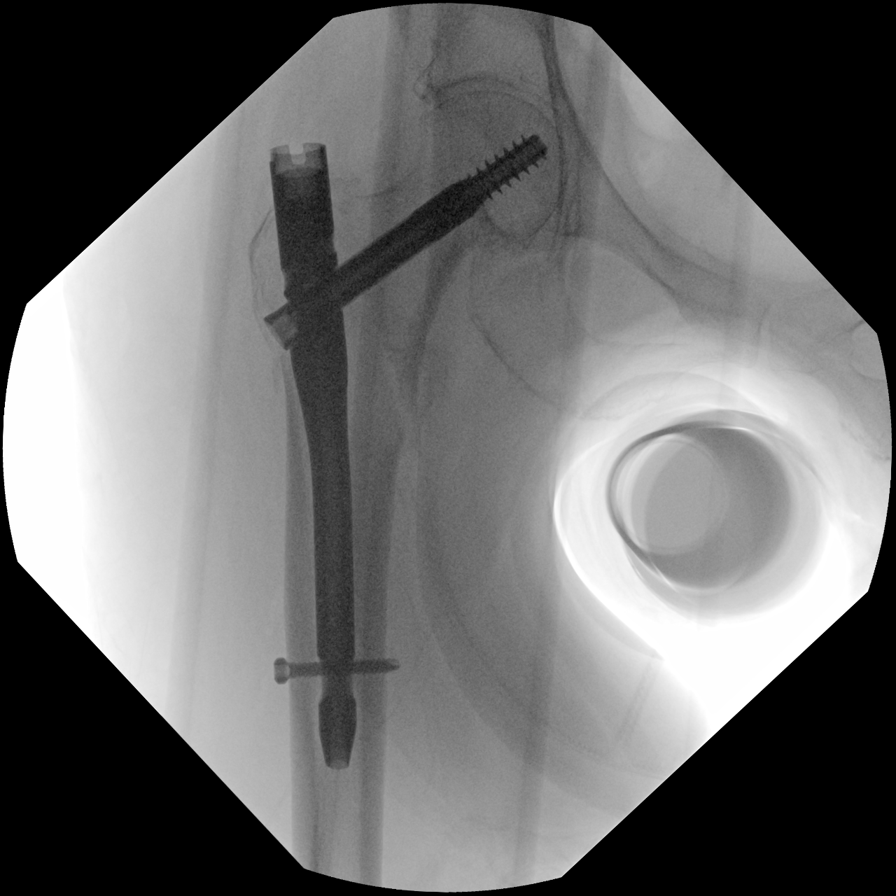
[im 4/5]
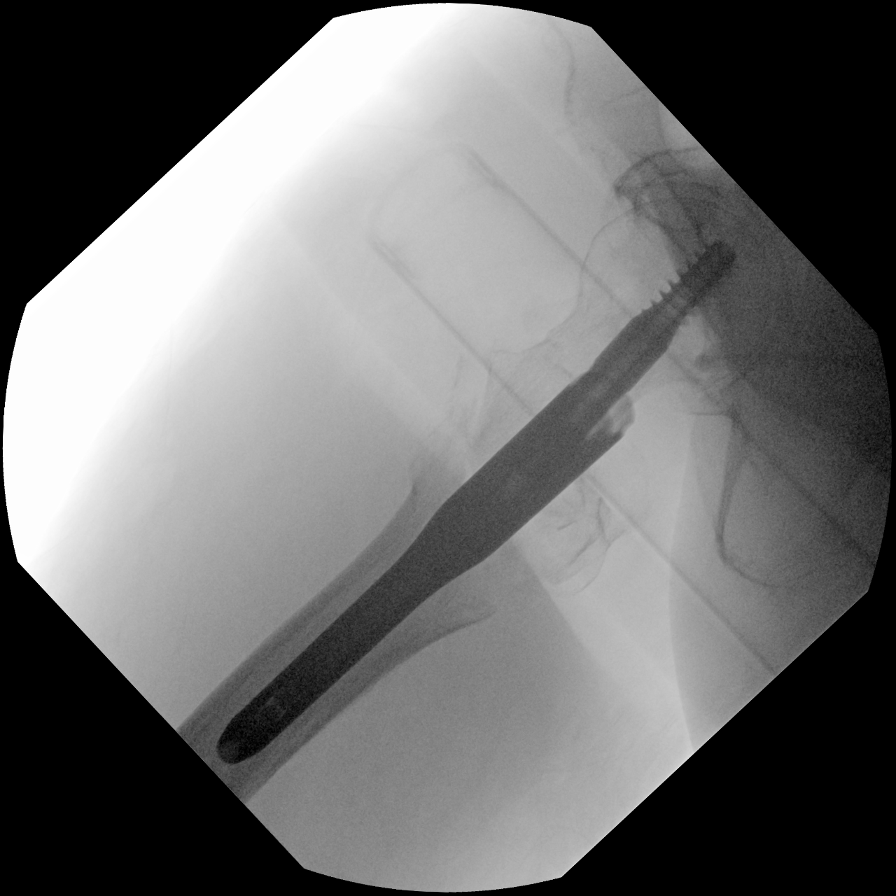
[im 5/5]
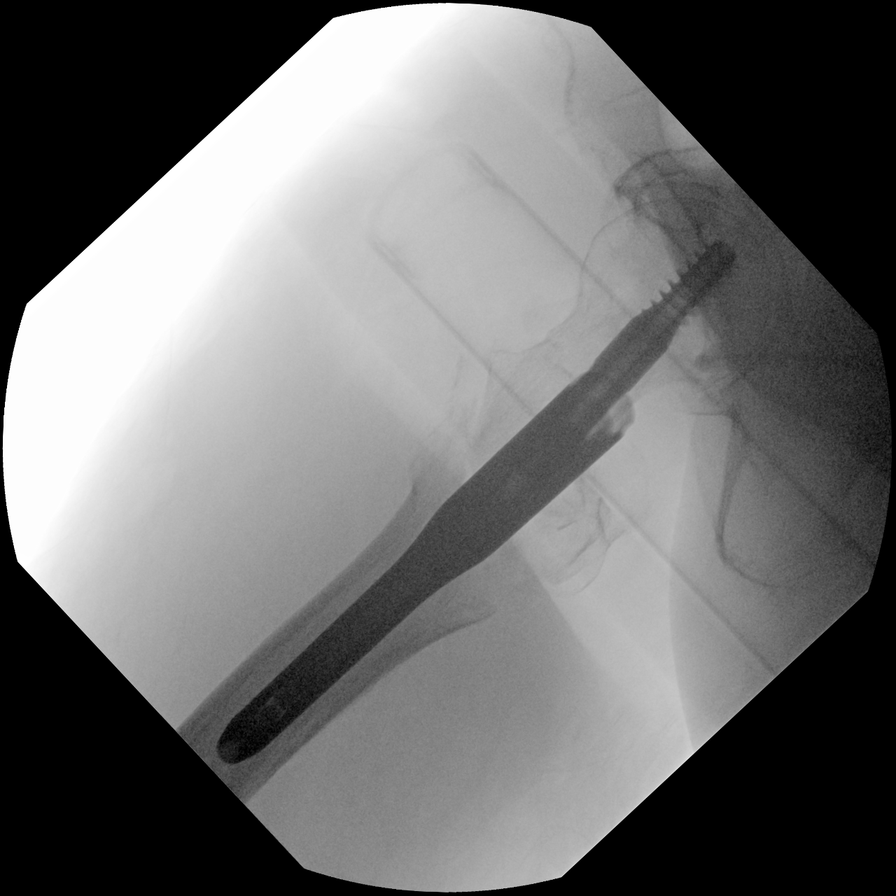

[5 of 5 positions shown; findings below may reference images not displayed]

FINDINGS: Intraoperative fluoroscopic images for right hip intramedullary nail
placement for intertrochanteric fracture. Total fluoroscopic time
was 1 minute. Total fluoroscopic dose was 7.99 mGy.
IMPRESSION: Intraoperative utilization of fluoroscopy for intramedullary nail
placement for ORIF of right intertrochanteric fracture.

## 2023-02-15 IMAGING — DX DG PORTABLE PELVIS
1 series · 1 of 1 positions shown · non-contrast
Comparison: None Available.

CLINICAL DATA: Status post right intramedullary nail.

EXAM:
PORTABLE PELVIS 1-2 VIEWS

[pelvis ap]
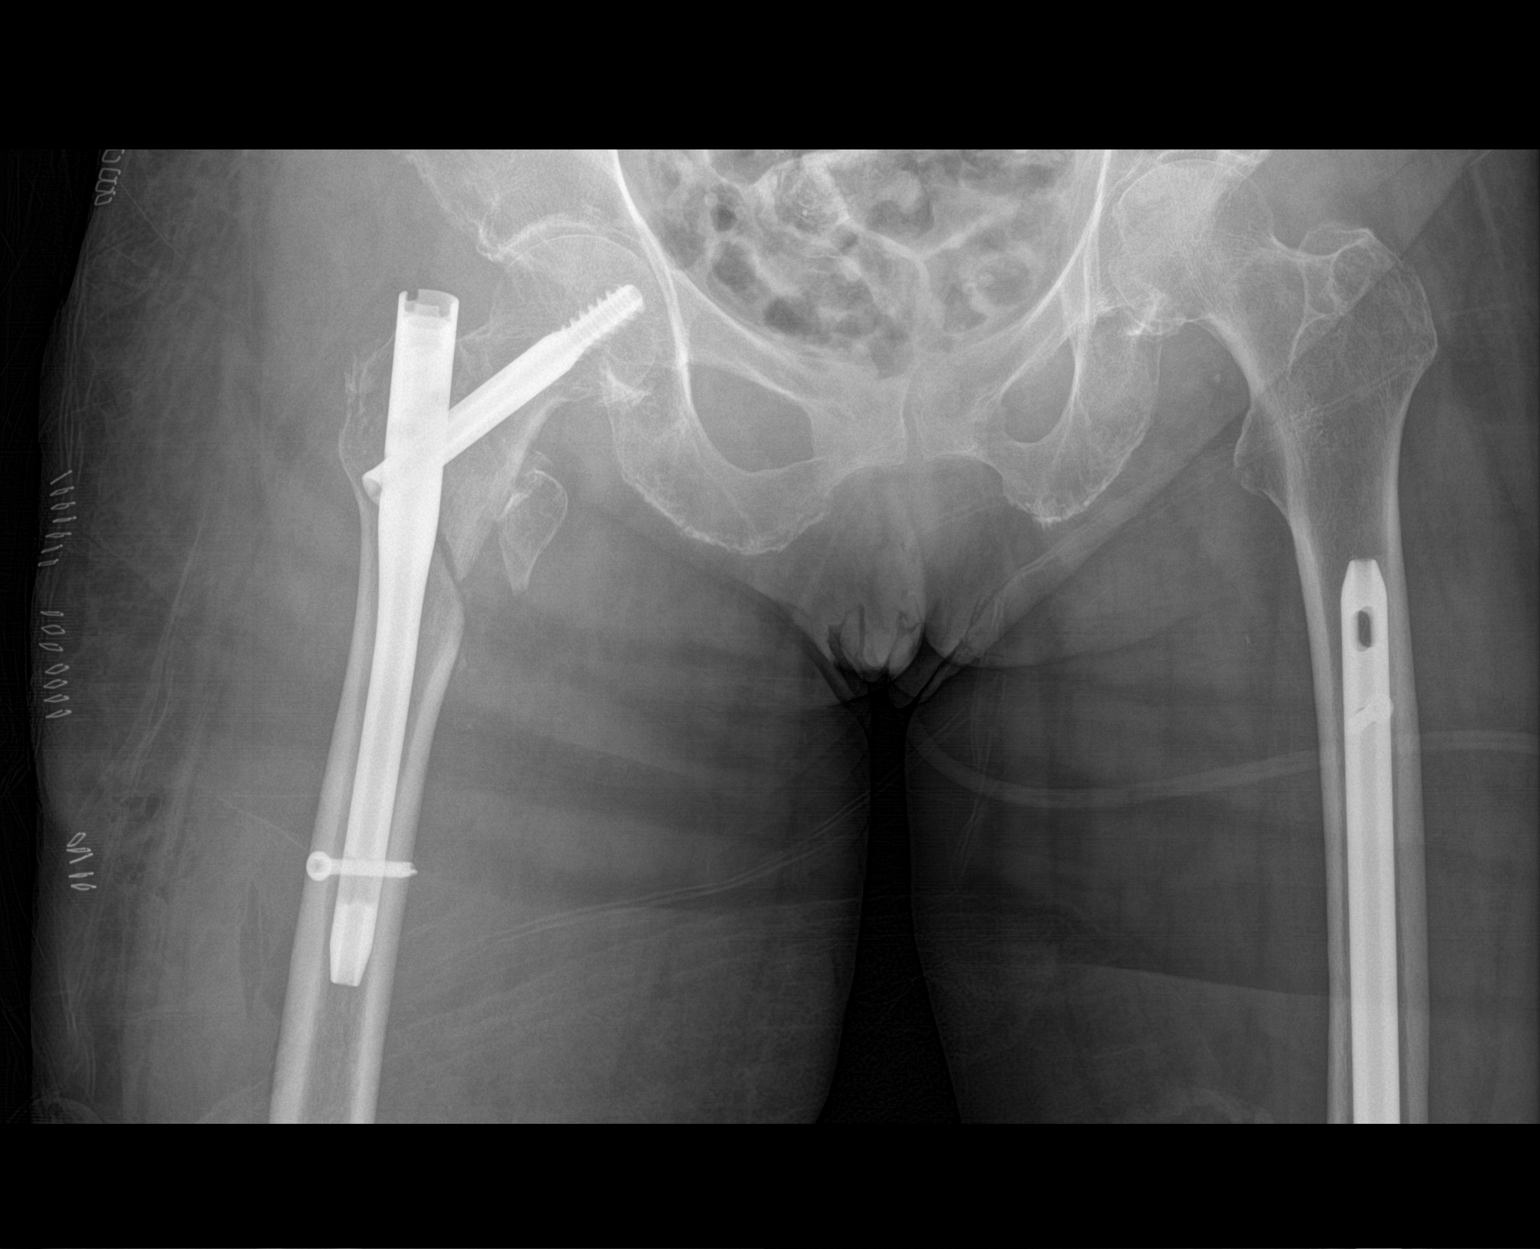

[1 of 1 positions shown; findings below may reference images not displayed]

FINDINGS: Status post right hip intramedullary nail with dynamic screw and a
single distal interlocking screw. Subcutaneous emphysema and
surgical clips along the lateral aspect of the thigh. Partially
imaged intramedullary nail in the left femur.
IMPRESSION: Status post right femoral intramedullary nail with dynamic screw.

## 2023-02-22 DIAGNOSIS — R531 Weakness: Secondary | ICD-10-CM | POA: Diagnosis not present

## 2023-02-22 DIAGNOSIS — R296 Repeated falls: Secondary | ICD-10-CM | POA: Diagnosis not present

## 2023-02-22 DIAGNOSIS — F03911 Unspecified dementia, unspecified severity, with agitation: Secondary | ICD-10-CM | POA: Diagnosis not present

## 2023-02-22 DIAGNOSIS — R8281 Pyuria: Secondary | ICD-10-CM | POA: Diagnosis not present

## 2023-02-26 NOTE — Progress Notes (Unsigned)
BH MD/PA/NP OP Progress Note  02/27/2023 3:36 PM Nancy Suarez  MRN:  [id number]  Visit Diagnosis:    ICD-10-CM   1. Dementia with behavioral disturbance (HCC)  F03.918 QUEtiapine (SEROQUEL) 50 MG tablet    divalproex (DEPAKOTE SPRINKLE) 125 MG capsule    2. Recurrent major depressive disorder, remission status unspecified (HCC)  F33.9 mirtazapine (REMERON) 15 MG tablet    escitalopram (LEXAPRO) 10 MG tablet       Assessment: Nancy Suarez is a 89 y.o. female with a history of depression, dementia with behavioral disturbance, chronic IDA, HLD, CKD stage 3 who presented to [facility] at [facility] for initial evaluation on 07/21/2022.  At initial evaluation patient reported symptoms of poor memory, visual hallucinations, behavioral disturbance, and depression.  During initial exam patient was found to only be oriented to person and month, while she was unsure of the day, year, and her location.  She was noted to be disorganized and had difficulty answering questions coherently at times.  Collateral obtained from the daughter confirmed that patient has had gradually worsening cognitive function and memory with a notable decline following her hospitalization in August 2023.  Patient has been experiencing episodes of sundowning and delirium in the afternoon/evenings.  Patient is also experiencing visual hallucinations that began after the memory decline.  There is a family history of Alzheimer's and the patient's brother.  Of note patient's MMSE on 06/19/22 was 15 and has been trending down over the past year.  Head CT from September 2023 showed recovering subdural hematoma, subacute anterior left frontal infarct, underlying atrophy with chronic small vessel ischemic change, and recovering periorbital hematoma.  At this time patient meets criteria for dementia with behavioral disturbance and MDD.  The specific nature of her dementia is unclear though there are signs consistent  with Lewy body dementia.  We would recommend continuing to coordinate with neurology to discuss neurocognitive testing and possible addition of cholinesterase inhibitor medications to help modulate dementia symptoms.  Nancy Suarez presents for follow-up evaluation. Today, 02/27/23, patient's daughter reports that Nancy Suarez has done well over the past month with regards to agitation and irritability. She did meet with the new neurologist who started her on memantine. Long term memory seems to have improved some.  Therapeutic intervention was conducted with the patient for the latter half of the session during which her interpersonal relationships and finding purpose were discussed.  Supportive and motivational interviewing techniques were used.  In regards to medications we will continue on her current regimen and follow up in 2 months.  Plan: - Continue Seroquel 50 mg QHS - Continue Depakote 125 mg QID - Continue Lexapro 15 mg QD - Continue Remeron 7.5 mg at bedtime - Continue Memantine 5 mg QD managed by neurology - CMP, CBC, UA, Depakote level reviewed - CT head w/o contrast from 03/24/22 reviewed - Crisis resources reviewed - Follow up in 2 months    Chief Complaint:  Chief Complaint  Patient presents with   Follow-up   HPI: Nancy Suarez presented alongside her daughter initially. Her daughter reports that things have been fairly good during the interim. Mood wise Nancy Suarez has been more stable, and her long term memory seems to have gotten a bit better. They met with the new neurologist who started her on memantine which seems to have helped. They had tried the BID dose but she was unable to tolerate it so decreased back to once a day. Outside of this the only issue  per her daughter was that Nancy Suarez got a UTI last week and is finishing up the antibiotic today.   The rest of the session was conducted primarily with Nancy Suarez to discuss her relationship with her friend Nancy Suarez, her father, and the feelings  that her daughter is smothering her.   Nancy Suarez reports that she is doing much better today but in the interim she had forgotten that her husband and her father were dead and was looking for them. Looking back she expressed some frustration that they did not correct her at the time. We discussed this and how it can sometimes make it worse to try to correct her on these topics when Nancy Suarez believes that they are lying to her.   We also discussed her frustration with her daughter. Nancy Suarez listed a few concerns including her daughter occasionally making comments that she will have to go to assisted living if Nancy Suarez can not continue to manage this. Another concerns was when her daughter told her she needed to go to bed. Nancy Suarez feels upset that she is not in as much control of her life after having lived for 89 years. Reviewed this and we progressed to patients lack of purpose in life. We talked about things that can give her purpose such as social interaction like church, talking with friends, or spending time with her grandkids.  Past Psychiatric History: Patient has connected with a psychiatrist several decades ago for depressed mood.  She denies any prior psychiatric hospitalizations.  It is unclear whether she had any suicide attempts as her mother did report 1 however her daughter had been unaware of the accuracy.  Her daughter did note her mother tried to throw herself down the stairs sometime in the last year.  Medications patient has tried Depakote, Seroquel, Risperdal, Haldol, Lexapro, Remeron, Atarax, Ativan, memantine, and Ambien.  Of these patient reports Risperdal was oversedating.  Remeron, Atarax, Ativan, and Ambien were discontinued due to adverse effects in elderly and dementia patient.  Denies any substance use  Past Medical History:  Past Medical History:  Diagnosis Date   Allergic rhinitis    ASCUS (atypical squamous cells of undetermined significance) on Pap smear    Atrophic vaginitis     Chronic insomnia    Depression    GERD (gastroesophageal reflux disease)    Goiter    History of kidney stones    Hypercholesteremia    Hypertension    LGSIL (low grade squamous intraepithelial dysplasia)    Osteopenia    Ovarian cyst    "shrank it"   Pancreatitis    Scoliosis    Spinal arthritis     Past Surgical History:  Procedure Laterality Date   ABDOMINAL HYSTERECTOMY  1977   partial   INTRAMEDULLARY (IM) NAIL INTERTROCHANTERIC Right 11/28/2021   Procedure: INTRAMEDULLARY (IM) NAIL INTERTROCHANTRIC;  Surgeon: Samson Frederic, MD;  Location: [facility];  Service: Orthopedics;  Laterality: Right;   KIDNEY STONE SURGERY  1990's   2-3 stones x1 surgery   ORIF FEMUR FRACTURE Left 06/09/2017   Procedure: OPEN REDUCTION INTERNAL FIXATION (ORIF) DISTAL FEMUR FRACTURE;  Surgeon: Durene Romans, MD;  Location: [facility];  Service: Orthopedics;  Laterality: Left;   TONSILLECTOMY  as teenager   TOTAL KNEE ARTHROPLASTY Left 01/19/2014   Procedure: LEFT TOTAL KNEE ARTHROPLASTY;  Surgeon: Shelda Pal, MD;  Location: [facility];  Service: Orthopedics;  Laterality: Left;   WISDOM TOOTH EXTRACTION      Family History:  Family History  Problem Relation Age  of Onset   Hypertension Father    Cancer Father    Cancer Mother        Bone   Cancer Brother     Social History:  Social History   Socioeconomic History   Marital status: Widowed    Spouse name: Not on file   Number of children: 1   Years of education: Not on file   Highest education level: Bachelor's degree (e.g., BA, AB, BS)  Occupational History   Not on file  Tobacco Use   Smoking status: Former    Current packs/day: 0.00    Types: Cigarettes    Start date: 07/03/1958    Quit date: 07/03/1978    Years since quitting: 44.6   Smokeless tobacco: Former  Building services engineer status: Never Used  Substance and Sexual Activity   Alcohol use: Yes    Comment: occ   Drug use: No   Sexual activity: Yes    Birth control/protection:  Surgical    Comment: hysterectomy  Other Topics Concern   Not on file  Social History Narrative   Lives at home with daughter, Nancy Suarez   Right handed   Caffeine: at the most 1 cup/day   Social Determinants of Health   Financial Resource Strain: Not on file  Food Insecurity: Not on file  Transportation Needs: Not on file  Physical Activity: Not on file  Stress: Not on file  Social Connections: Not on file    Allergies:  Allergies  Allergen Reactions   Ambien [Zolpidem] Other (See Comments)    Disorientation Aggression    Aspirin Other (See Comments)    Upsets stomach if 325mg    Codeine Nausea And Vomiting   Namenda [Memantine] Other (See Comments)    Agitation Aggression    Percocet [Oxycodone-Acetaminophen] Other (See Comments)    Disorientation Aggression     Sulfa Antibiotics Nausea Only    Current Medications: Current Outpatient Medications  Medication Sig Dispense Refill   acetaminophen (TYLENOL) 500 MG tablet Take 2 tablets (1,000 mg total) by mouth 2 (two) times daily. (Patient taking differently: Take 1,000 mg by mouth See admin instructions. 1000 mg twice daily (morning and evening) + an additional 500 mg during the day as needed for pain.) 30 tablet 0   atorvastatin (LIPITOR) 20 MG tablet Take 20 mg by mouth daily.     carboxymethylcellulose (REFRESH PLUS) 0.5 % SOLN Place 1 drop into both eyes in the morning, at noon, and at bedtime.     carvedilol (COREG) 3.125 MG tablet Take 1 tablet (3.125 mg total) by mouth 2 (two) times daily with a meal. 60 tablet 0   divalproex (DEPAKOTE SPRINKLE) 125 MG capsule Take 1 capsule (125 mg total) by mouth in the morning, at noon, in the evening, and at bedtime. Take 1 tab morning and afternoon, take 2 tablets evening and bedtime 360 capsule 1   docusate sodium (COLACE) 100 MG capsule Take 100 mg by mouth daily as needed for mild constipation.     escitalopram (LEXAPRO) 10 MG tablet Take 1.5 tablets (15 mg total) by mouth daily.  135 tablet 1   famotidine (PEPCID) 20 MG tablet Take 1 tablet (20 mg total) by mouth daily. 30 tablet 0   Fexofenadine HCl (ALLEGRA ALLERGY PO) Take 1 tablet by mouth daily.     fluticasone (FLONASE) 50 MCG/ACT nasal spray Place 1-2 sprays into both nostrils daily as needed for allergies.     MELATONIN PO Take 10 mg  by mouth at bedtime.     memantine (NAMENDA) 5 MG tablet Take 1 tablet (5 mg at night) for 2 weeks, then increase to 1 tablet (5 mg) twice a day 60 tablet 11   mirtazapine (REMERON) 15 MG tablet Take 0.5 tablets (7.5 mg total) by mouth at bedtime. 45 tablet 1   Multiple Vitamins-Minerals (MULTIVITAMIN ADULT) CHEW Chew 1 tablet by mouth daily.     QUEtiapine (SEROQUEL) 50 MG tablet Take 1 tablet (50 mg total) by mouth at bedtime. 90 tablet 1   No current facility-administered medications for this visit.    Psychiatric Specialty Exam: Review of Systems  There were no vitals taken for this visit.There is no height or weight on file to calculate BMI.  General Appearance: Fairly Groomed  Eye Contact:  Good  Speech:  Normal Rate  Volume:  Normal  Mood:  Anxious and Euthymic  Affect:  Labile and Tearful  Thought Process:  Coherent  Orientation:  Full (Time, Place, and Person)  Thought Content: Vague   Suicidal Thoughts:  No  Homicidal Thoughts:  No  Memory:  Immediate;   Fair Recent;   Poor Remote;   Fair  Judgement:  Impaired  Insight:  Lacking  Psychomotor Activity:  Tremor  Concentration:  Concentration: Fair  Recall:  Fair  Fund of Knowledge: Poor  Language: Fair  Akathisia:  No    AIMS (if indicated): not done  Assets:  Communication Skills Desire for Improvement Financial Resources/Insurance Housing Social Support Transportation  ADL's:  Intact  Cognition: Impaired,  Moderate  Sleep:  Fair   Metabolic Disorder Labs: Lab Results  Component Value Date   HGBA1C 4.9 09/06/2020   No results found for: "PROLACTIN" No results found for: "CHOL", "TRIG",  "HDL", "CHOLHDL", "VLDL", "LDLCALC" Lab Results  Component Value Date   TSH 1.22 12/29/2022   TSH 3.261 03/12/2022    Therapeutic Level Labs: No results found for: "LITHIUM" Lab Results  Component Value Date   VALPROATE 46 (L) 03/26/2022   No results found for: "CBMZ"   Screenings: Mini-Mental    Flowsheet Row Office Visit from 12/29/2022 in [facility] Office Visit from 06/19/2022 in [facility] Office Visit from 02/14/2022 in [facility] Office Visit from 06/07/2021 in [facility] Office Visit from 03/01/2021 in [facility]  Total Score (max 30 points ) 15 15 17 21 21       [id number]    Flowsheet Row Office Visit from 12/10/2017 in [facility] at [facility] Total Score 0      Flowsheet Row ED to Hosp-Admission (Discharged) from 03/12/2022 in [facility] ED to Hosp-Admission (Discharged) from 02/28/2022 in [facility] from 02/02/2022 in [facility]  C-SSRS RISK CATEGORY No Risk No Risk No Risk       Collaboration of Care: Collaboration of Care: Medication Management AEB medication prescription and Other provider involved in patient's care AEB neurology chart review  40 minutes were spent in chart review, interview, psycho education, counseling, medical decision making, coordination of care and long-term prognosis.  Patient was given opportunity to ask question and all concerns and questions were addressed and answers. Excluding separately billable services.   Patient/Guardian was advised Release of Information must be obtained prior to any record release in order to collaborate their care with an outside provider. Patient/Guardian was advised if they  have not already done so to contact the registration department to sign all necessary forms in order for [place] to  release information regarding their care.   Consent: Patient/Guardian gives verbal consent for treatment and assignment of benefits for services provided during this visit. Patient/Guardian expressed understanding and agreed to proceed.     Virtual Visit via Video Note  I connected with Sharyl Nimrod on 02/27/23 at  3:00 PM EDT by a video enabled telemedicine application and verified that I am speaking with the correct person using two identifiers.  Location: Patient: Home Provider: Home Office   I discussed the limitations of evaluation and management by telemedicine and the availability of in person appointments. The patient expressed understanding and agreed to proceed.   I discussed the assessment and treatment plan with the patient. The patient was provided an opportunity to ask questions and all were answered. The patient agreed with the plan and demonstrated an understanding of the instructions.   The patient was advised to call back or seek an in-person evaluation if the symptoms worsen or if the condition fails to improve as anticipated.  40 minutes were spent in chart review, interview, psycho education, counseling, medical decision making, coordination of care and long-term prognosis.  Patient was given opportunity to ask question and all concerns and questions were addressed and answers. Excluding separately billable services.   Stasia Cavalier, MD 02/27/2023, 3:36 PM

## 2023-02-27 ENCOUNTER — Telehealth (HOSPITAL_COMMUNITY): Payer: Medicare Other | Admitting: Psychiatry

## 2023-02-27 ENCOUNTER — Encounter (HOSPITAL_COMMUNITY): Payer: Self-pay | Admitting: Psychiatry

## 2023-02-27 DIAGNOSIS — F339 Major depressive disorder, recurrent, unspecified: Secondary | ICD-10-CM | POA: Diagnosis not present

## 2023-02-27 DIAGNOSIS — F03918 Unspecified dementia, unspecified severity, with other behavioral disturbance: Secondary | ICD-10-CM

## 2023-02-27 MED ORDER — ESCITALOPRAM OXALATE 10 MG PO TABS
15.0000 mg | ORAL_TABLET | Freq: Every day | ORAL | 1 refills | Status: DC
Start: 2023-02-27 — End: 2023-07-17

## 2023-02-27 MED ORDER — DIVALPROEX SODIUM 125 MG PO CSDR
125.0000 mg | DELAYED_RELEASE_CAPSULE | Freq: Four times a day (QID) | ORAL | 1 refills | Status: DC
Start: 2023-02-27 — End: 2023-02-28

## 2023-02-27 MED ORDER — MIRTAZAPINE 15 MG PO TABS: 7.5000 mg | ORAL_TABLET | Freq: Every day | ORAL | 1 refills | Status: DC

## 2023-02-27 MED ORDER — QUETIAPINE FUMARATE 50 MG PO TABS
50.0000 mg | ORAL_TABLET | Freq: Every day | ORAL | 1 refills | Status: DC
Start: 2023-02-27 — End: 2023-07-17

## 2023-02-28 ENCOUNTER — Telehealth (HOSPITAL_COMMUNITY): Payer: Self-pay

## 2023-02-28 DIAGNOSIS — F03918 Unspecified dementia, unspecified severity, with other behavioral disturbance: Secondary | ICD-10-CM

## 2023-02-28 MED ORDER — DIVALPROEX SODIUM 125 MG PO CSDR
125.0000 mg | DELAYED_RELEASE_CAPSULE | Freq: Four times a day (QID) | ORAL | 1 refills | Status: DC
Start: 2023-02-28 — End: 2023-07-17

## 2023-02-28 NOTE — Telephone Encounter (Signed)
Can you please clarify how the patient is supposed to take the Depakote? Pharmacy is calling for clarification and I want to make sure I have it correct. I am seeing 1 po qam, 1 po q noon, 2 po q dinner and 2 po at bedtime. Please review and advise, thank you

## 2023-02-28 NOTE — Telephone Encounter (Signed)
She takes the Depakote a125 mg 4 times a day. I sent a new script with updated instructions

## 2023-04-02 DIAGNOSIS — Z0001 Encounter for general adult medical examination with abnormal findings: Secondary | ICD-10-CM | POA: Diagnosis not present

## 2023-04-02 DIAGNOSIS — R531 Weakness: Secondary | ICD-10-CM | POA: Diagnosis not present

## 2023-04-02 DIAGNOSIS — M899 Disorder of bone, unspecified: Secondary | ICD-10-CM | POA: Diagnosis not present

## 2023-04-02 DIAGNOSIS — R296 Repeated falls: Secondary | ICD-10-CM | POA: Diagnosis not present

## 2023-04-02 DIAGNOSIS — Q820 Hereditary lymphedema: Secondary | ICD-10-CM | POA: Diagnosis not present

## 2023-04-02 DIAGNOSIS — E7800 Pure hypercholesterolemia, unspecified: Secondary | ICD-10-CM | POA: Diagnosis not present

## 2023-04-02 DIAGNOSIS — Z79899 Other long term (current) drug therapy: Secondary | ICD-10-CM | POA: Diagnosis not present

## 2023-04-02 DIAGNOSIS — Z23 Encounter for immunization: Secondary | ICD-10-CM | POA: Diagnosis not present

## 2023-04-02 DIAGNOSIS — I119 Hypertensive heart disease without heart failure: Secondary | ICD-10-CM | POA: Diagnosis not present

## 2023-04-02 DIAGNOSIS — Z1331 Encounter for screening for depression: Secondary | ICD-10-CM | POA: Diagnosis not present

## 2023-04-05 DIAGNOSIS — R296 Repeated falls: Secondary | ICD-10-CM | POA: Diagnosis not present

## 2023-04-05 DIAGNOSIS — Z0001 Encounter for general adult medical examination with abnormal findings: Secondary | ICD-10-CM | POA: Diagnosis not present

## 2023-04-10 ENCOUNTER — Encounter: Payer: Self-pay | Admitting: Physician Assistant

## 2023-04-10 ENCOUNTER — Ambulatory Visit: Payer: Medicare Other | Admitting: Physician Assistant

## 2023-04-10 VITALS — BP 151/73 | HR 74 | Resp 20 | Ht 65.0 in

## 2023-04-10 DIAGNOSIS — F03918 Unspecified dementia, unspecified severity, with other behavioral disturbance: Secondary | ICD-10-CM | POA: Diagnosis not present

## 2023-04-10 NOTE — Progress Notes (Signed)
Assessment/Plan:   Dementia likely due to Alzheimer's Disease with behavioral disturbance   Nancy Suarez is a very pleasant 89 y.o. RH female with a history of hypertension, hyperlipidemia, GERD, osteopenia, OSA not on CPAP, depression, history of pancreatitis, CKD, anemia, insomnia and a history of dementia likely due to Alzheimer's Disease with behavioral disturbance seen today in follow up for memory loss. Patient is currently on memantine 5 mg in the evening  could not tolerate bid due to increased confusion). Memory is stable. Mood is well controlled with psychiatry.     Follow up in 6 months. Increase memantine to 5 mg daily . Side effects discussed Recommend good control of her cardiovascular risk factors Continue to control mood as per Psych with Remeron, Seroquel and Depakote     Subjective:    This patient is accompanied in the office by her daughter who supplements the history.  Previous records as well as any outside records available were reviewed prior to todays visit. Patient was last seen on 12/29/22 with MMSE 15/30      Any changes in memory since last visit? "About the same". Patient has some difficulty remembering recent conversations and people names "She gets lost in time", sometimes telling her daughter that she is not at home, referring to childhood home.  Enjoys going to [employer] and participating in chorus. LTM is better. repeats oneself?  Endorsed Disoriented when walking into a room?  She is forgetting where she is, especially in the evening.     Leaving objects in unusual places?  May misplace things in unusual places, she collects tissues, clippers, pens an puts then in the bottom of the tissue box.  Wandering behavior?  Denies.   Any personality changes since last visit? She has some sundowners and argues with her daughter. She calls her daughter "bossy". Any worsening depression?:  Denies.   Hallucinations or paranoia? She may see rats. She is paranoid  about her purse, she wishes to carry it at all times. Seizures? denies    Any sleep changes?  She does not like to go to bed and wakes up late. She takes Seroquel and melatonin with good response.  Denies vivid dreams, REM behavior or sleepwalking   Sleep apnea?   Denies.   Any hygiene concerns? Denies.  Independent of bathing and dressing? Needs assistance Does the patient needs help with medications? Daughter is in charge   Who is in charge of the finances? Daughter  is in charge     Any changes in appetite? She forgets that she ate and eats again.     Patient have trouble swallowing? Denies.   Does the patient cook? No Any headaches?   Denies.   Chronic back pain endorsed, due to chronic arthritis Ambulates with difficulty? Uses a wheelchair, a walker at home.Marland Kitchen   Recent falls or head injuries?      Unilateral weakness, numbness or tingling? Denies.   Any tremors?  Denies   Any anosmia?  Denies   Any incontinence of urine?  Endorsed, wears diapers Any bowel dysfunction?   Denies      Patient lives with her daughter Does the patient drive? No longer drives    Initial Visit 12/29/22 How long did patient have memory difficulties?  For at least 3 years per daughter report. Patient has some difficulty remembering recent conversations and people names, including her daughter's . She also "gets lost in time". "My LTM is better than my daughter's ". Enjoys doing crossword  puzzles. She enjoys singing and  wants to sing at the [facility] [place]. repeats oneself?  Endorsed Disoriented when walking into a room?  Sometimes she thinks she is back in her house, or in [place] working as a Runner, broadcasting/film/video. In today's visit she talks about he r brother as if he is alive.  Leaving objects in unusual places?  Not recently. There was a period in 10/2022 where she was taking the trash out of the trash.  Collects tissues and napkins.   Wandering behavior? denies   Any personality changes ?  She experiences sundowning  episodes as well as visual hallucinations beginning after her memory changes. Any history of depression?: Endorsed, lifelong. She cries when reminded of her memory issues  Hallucinations or paranoia? She sees rats and bugs, sometimes she sees people during her sundowns.  Daughter took purse away because she would carry her tissues in it, ad she would be at risk for fall. Psychiatry following.  Seizures? At the hospital after a fall, no recurrence.   Any sleep changes?  Sometimes she does not sleep well. "She is a night owl"-daughter says. She takes melatonin and Seroquel with good response.  Denies vivid dreams, REM behavior or sleepwalking   Sleep apnea Endorsed, not on CPAP Any hygiene concerns? "She has to be reminded, she may fight it"  Independent of bathing and dressing? Needs help with dressing and bathing. Does the patient need help with medications? Daughter  is in charge   Who is in charge of the finances? Daughter is in charge     Any changes in appetite? Eats well. Sometimes she eats again. Does not drink enough water Patient have trouble swallowing?  no Does the patient cook? No  Any headaches?  denies   Chronic pain?  She has chronic back pain due to arthritis. Ambulates with difficulty?  Needs a walker to ambulate for stability.   Recent falls or head injuries? Endorsed, in 2022 she sustained SDH And possibly SAH as above. No other head injuries  Vision changes? Denies  Unilateral weakness, numbness or tingling? denies   Any tremors? denies   Any anosmia? denies   Any incontinence of urine? Endorsed, wears pads Any bowel dysfunction? denies      Patient lives with her daughter   History of heavy alcohol intake? denies   History of heavy tobacco use? denies   Family history of dementia?  Brother died with dementia  Does patient drive? No  College degree. Retired Runner, broadcasting/film/video   Vit B12  (288, TSH 1.22     Latest MRI of the brain without contrast on 03/04/2022 are remarkable for  hematoma after the fall, at the left frontal lobe with some subarachnoid blood, right parietal and left occipital lobes cortical superficial siderosis versus prior subarachnoid bleed (also seen on MRI 09/23/2020), mild chronic microvascular ischemic changes, and mild to moderate generalized cortical atrophy similar to prior MRI on 09/23/2020.   MRI brain  01/2023 personally reviewed remarkable for known mid left frontal lobe, sequelae of prior hemorrhagic parenchymal contusion. posterior cerebral hemispheres bilaterally sequela of prior subarachnoid hemorrhage. Mild-to-moderate chronic small vessel ischemic changes within the cerebral white matter, similar to the prior brain MRI of 03/04/2022,   and moderate generalized cerebral atrophy, mild cerebellar atrophy.   PREVIOUS MEDICATIONS: cannot take the second pill because she gets more agitated   CURRENT MEDICATIONS:  Outpatient Encounter Medications as of 04/10/2023  Medication Sig   acetaminophen (TYLENOL) 500 MG tablet Take 2 tablets (1,000  mg total) by mouth 2 (two) times daily. (Patient taking differently: Take 1,000 mg by mouth See admin instructions. 1000 mg twice daily (morning and evening) + an additional 500 mg during the day as needed for pain.)   atorvastatin (LIPITOR) 20 MG tablet Take 20 mg by mouth daily.   carboxymethylcellulose (REFRESH PLUS) 0.5 % SOLN Place 1 drop into both eyes in the morning, at noon, and at bedtime.   carvedilol (COREG) 3.125 MG tablet Take 1 tablet (3.125 mg total) by mouth 2 (two) times daily with a meal.   divalproex (DEPAKOTE SPRINKLE) 125 MG capsule Take 1 capsule (125 mg total) by mouth in the morning, at noon, in the evening, and at bedtime.   docusate sodium (COLACE) 100 MG capsule Take 100 mg by mouth daily as needed for mild constipation.   escitalopram (LEXAPRO) 10 MG tablet Take 1.5 tablets (15 mg total) by mouth daily.   famotidine (PEPCID) 20 MG tablet Take 1 tablet (20 mg total) by mouth daily.    Fexofenadine HCl (ALLEGRA ALLERGY PO) Take 1 tablet by mouth daily.   fluticasone (FLONASE) 50 MCG/ACT nasal spray Place 1-2 sprays into both nostrils daily as needed for allergies.   MELATONIN PO Take 10 mg by mouth at bedtime.   memantine (NAMENDA) 5 MG tablet Take 1 tablet (5 mg at night) for 2 weeks, then increase to 1 tablet (5 mg) twice a day (Patient taking differently: Take 1 tablet (5 mg ) daily)   mirtazapine (REMERON) 15 MG tablet Take 0.5 tablets (7.5 mg total) by mouth at bedtime.   Multiple Vitamins-Minerals (MULTIVITAMIN ADULT) CHEW Chew 1 tablet by mouth daily.   QUEtiapine (SEROQUEL) 50 MG tablet Take 1 tablet (50 mg total) by mouth at bedtime.   No facility-administered encounter medications on file as of 04/10/2023.       12/29/2022   11:00 AM 06/19/2022    2:18 PM 02/14/2022    9:15 AM  MMSE - Mini Mental State Exam  Orientation to time 0 1 3  Orientation to Place 5 5 5   Registration 3 3 3   Attention/ Calculation 0 0 0  Recall 0 0 0  Language- name 2 objects 2 2 1   Language- repeat 1 0 0  Language- follow 3 step command 2 2 3   Language- read & follow direction 1 1 1   Write a sentence 1 1 1   Copy design 0 0 0  Total score 15 15 17        No data to display          Objective:     PHYSICAL EXAMINATION:    VITALS:   Vitals:   04/10/23 1123 04/10/23 1207  BP: (!) 148/76 (!) 151/73  Pulse: 74   Resp: 20   SpO2: 98%   Height: 5\' 5"  (1.651 m)     GEN:  The patient appears stated age and is in NAD. HEENT:  Normocephalic, atraumatic.   Neurological examination:  General: NAD, well-groomed, appears stated age. Orientation: The patient is alert. Oriented to person, place and not to date Cranial nerves: There is good facial symmetry.The speech is fluent and clear. No aphasia or dysarthria. Fund of knowledge is reduced. Recent and remote memory are impaired. Attention and concentration are reduced.  Able to name objects and unable to repeat phrases.   Hearing is intact to conversational tone.   Sensation: Sensation is intact to light touch throughout Motor: Strength is at least antigravity x4. DTR's 2/4 in UE/LE  Movement examination: Tone: There is normal tone in the UE/LE Abnormal movements:  no tremor.  No myoclonus.  No asterixis.   Coordination:  There is some decremation with RAM's on the L hand. Normal finger to nose on the L  Gait and Station: patient in wheelchair, gait unable to be tested    Thank you for allowing [place] the opportunity to participate in the care of this nice patient. Please do not hesitate to contact us for any questions or concerns.   Total time spent on today's visit was 30 minutes dedicated to this patient today, preparing to see patient, examining the patient, ordering tests and/or medications and counseling the patient, documenting clinical information in the EHR or other health record, independently interpreting results and communicating results to the patient/family, discussing treatment and goals, answering patient's questions and coordinating care.  Cc:  Corine Shelter, MD  Marlowe Kays 04/10/2023 12:08 PM

## 2023-04-10 NOTE — Patient Instructions (Addendum)
It was a pleasure to see you today at our office.  Recommendations:  Continue Memantine 5 mg tablets daily.   Follow up in April 9 at 3 pm    For guidance regarding [facility] and if placement were needed at the facility, contact Sidney Ace, Social Worker tel: [phone]   For assessment of decision of mental capacity and competency:  Call Dr. Erick Blinks, geriatric psychiatrist at [phone]  Counseling regarding caregiver distress, including caregiver depression, anxiety and issues regarding community resources, adult day care programs, adult living facilities, or memory care questions:  please contact your  Primary Doctor's Social Worker  Whom to call: Memory  decline, memory medications: Call our office [phone]  For psychiatric meds, mood meds: Please have your primary care physician manage these medications.  If you have any severe symptoms of a stroke, or other severe issues such as confusion,severe chills or fever, etc call 911 or go to the ER as you may need to be evaluated further      RECOMMENDATIONS FOR ALL PATIENTS WITH MEMORY PROBLEMS: 1. Continue to exercise (Recommend 30 minutes of walking everyday, or 3 hours every week) 2. Increase social interactions - continue going to [place] and enjoy social gatherings with friends and family 3. Eat healthy, avoid fried foods and eat more fruits and vegetables 4. Maintain adequate blood pressure, blood sugar, and blood cholesterol level. Reducing the risk of stroke and cardiovascular disease also helps promoting better memory. 5. Avoid stressful situations. Live a simple life and avoid aggravations. Organize your time and prepare for the next day in anticipation. 6. Sleep well, avoid any interruptions of sleep and avoid any distractions in the bedroom that may interfere with adequate sleep quality 7. Avoid sugar, avoid sweets as there is a strong link between excessive sugar intake, diabetes, and  cognitive impairment We discussed the Mediterranean diet, which has been shown to help patients reduce the risk of progressive memory disorders and reduces cardiovascular risk. This includes eating fish, eat fruits and green leafy vegetables, nuts like almonds and hazelnuts, walnuts, and also use olive oil. Avoid fast foods and fried foods as much as possible. Avoid sweets and sugar as sugar use has been linked to worsening of memory function.  There is always a concern of gradual progression of memory problems. If this is the case, then we may need to adjust level of care according to patient needs. Support, both to the patient and caregiver, should then be put into place.    The Alzheimer's Association is here all day, every day for people facing Alzheimer's disease through our free 24/7 Helpline: [phone]. The Helpline provides reliable information and support to all those who need assistance, such as individuals living with memory loss, Alzheimer's or other dementia, caregivers, health care professionals and the public.  Our highly trained and knowledgeable staff can help you with: Understanding memory loss, dementia and Alzheimer's  Medications and other treatment options  General information about aging and brain health  Skills to provide quality care and to find the best care from professionals  Legal, financial and living-arrangement decisions Our Helpline also features: Confidential care consultation provided by master's level clinicians who can help with decision-making support, crisis assistance and education on issues families face every day  Help in a caller's preferred language using our translation service that features more than 200 languages and dialects  Referrals to local community programs, services and ongoing support     FALL PRECAUTIONS: Be cautious  when walking. Scan the area for obstacles that may increase the risk of trips and falls. When getting up in the mornings,  sit up at the edge of the bed for a few minutes before getting out of bed. Consider elevating the bed at the head end to avoid drop of blood pressure when getting up. Walk always in a well-lit room (use night lights in the walls). Avoid area rugs or power cords from appliances in the middle of the walkways. Use a walker or a cane if necessary and consider physical therapy for balance exercise. Get your eyesight checked regularly.  FINANCIAL OVERSIGHT: Supervision, especially oversight when making financial decisions or transactions is also recommended.  HOME SAFETY: Consider the safety of the kitchen when operating appliances like stoves, microwave oven, and blender. Consider having supervision and share cooking responsibilities until no longer able to participate in those. Accidents with firearms and other hazards in the house should be identified and addressed as well.   ABILITY TO BE LEFT ALONE: If patient is unable to contact 911 operator, consider using LifeLine, or when the need is there, arrange for someone to stay with patients. Smoking is a fire hazard, consider supervision or cessation. Risk of wandering should be assessed by caregiver and if detected at any point, supervision and safe proof recommendations should be instituted.  MEDICATION SUPERVISION: Inability to self-administer medication needs to be constantly addressed. Implement a mechanism to ensure safe administration of the medications.   DRIVING: Regarding driving, in patients with progressive memory problems, driving will be impaired. We advise to have someone else do the driving if trouble finding directions or if minor accidents are reported. Independent driving assessment is available to determine safety of driving.   If you are interested in the driving assessment, you can contact the following:  [employer] in [place] [phone]  [facility] [phone]  [facility]  [phone] or [phone]      Mediterranean Diet A Mediterranean diet refers to food and lifestyle choices that are based on the traditions of countries located on the [employer]. This way of eating has been shown to help prevent certain conditions and improve outcomes for people who have chronic diseases, like kidney disease and heart disease. What are tips for following this plan? Lifestyle  Cook and eat meals together with your family, when possible. Drink enough fluid to keep your urine clear or pale yellow. Be physically active every day. This includes: Aerobic exercise like running or swimming. Leisure activities like gardening, walking, or housework. Get 7-8 hours of sleep each night. If recommended by your health care provider, drink red wine in moderation. This means 1 glass a day for nonpregnant women and 2 glasses a day for men. A glass of wine equals 5 oz (150 mL). Reading food labels  Check the serving size of packaged foods. For foods such as rice and pasta, the serving size refers to the amount of cooked product, not dry. Check the total fat in packaged foods. Avoid foods that have saturated fat or trans fats. Check the ingredients list for added sugars, such as corn syrup. Shopping  At the grocery store, buy most of your food from the areas near the walls of the store. This includes: Fresh fruits and vegetables (produce). Grains, beans, nuts, and seeds. Some of these may be available in unpackaged forms or large amounts (in bulk). Fresh seafood. Poultry and eggs. Low-fat dairy products. Buy whole ingredients instead  of prepackaged foods. Buy fresh fruits and vegetables in-season from local farmers markets. Buy frozen fruits and vegetables in resealable bags. If you do not have access to quality fresh seafood, buy precooked frozen shrimp or canned fish, such as tuna, salmon, or sardines. Buy small amounts of raw or cooked  vegetables, salads, or olives from the deli or salad bar at your store. Stock your pantry so you always have certain foods on hand, such as olive oil, canned tuna, canned tomatoes, rice, pasta, and beans. Cooking  Cook foods with extra-virgin olive oil instead of using butter or other vegetable oils. Have meat as a side dish, and have vegetables or grains as your main dish. This means having meat in small portions or adding small amounts of meat to foods like pasta or stew. Use beans or vegetables instead of meat in common dishes like chili or lasagna. Experiment with different cooking methods. Try roasting or broiling vegetables instead of steaming or sauteing them. Add frozen vegetables to soups, stews, pasta, or rice. Add nuts or seeds for added healthy fat at each meal. You can add these to yogurt, salads, or vegetable dishes. Marinate fish or vegetables using olive oil, lemon juice, garlic, and fresh herbs. Meal planning  Plan to eat 1 vegetarian meal one day each week. Try to work up to 2 vegetarian meals, if possible. Eat seafood 2 or more times a week. Have healthy snacks readily available, such as: Vegetable sticks with hummus. Greek yogurt. Fruit and nut trail mix. Eat balanced meals throughout the week. This includes: Fruit: 2-3 servings a day Vegetables: 4-5 servings a day Low-fat dairy: 2 servings a day Fish, poultry, or lean meat: 1 serving a day Beans and legumes: 2 or more servings a week Nuts and seeds: 1-2 servings a day Whole grains: 6-8 servings a day Extra-virgin olive oil: 3-4 servings a day Limit red meat and sweets to only a few servings a month What are my food choices? Mediterranean diet Recommended Grains: Whole-grain pasta. Brown rice. Bulgar wheat. Polenta. Couscous. Whole-wheat bread. Orpah Cobb. Vegetables: Artichokes. Beets. Broccoli. Cabbage. Carrots. Eggplant. Green beans. Chard. Kale. Spinach. Onions. Leeks. Peas. Squash. Tomatoes. Peppers.  Radishes. Fruits: Apples. Apricots. Avocado. Berries. Bananas. Cherries. Dates. Figs. Grapes. Lemons. Melon. Oranges. Peaches. Plums. Pomegranate. Meats and other protein foods: Beans. Almonds. Sunflower seeds. Pine nuts. Peanuts. Cod. Salmon. Scallops. Shrimp. Tuna. Tilapia. Clams. Oysters. Eggs. Dairy: Low-fat milk. Cheese. Greek yogurt. Beverages: Water. Red wine. Herbal tea. Fats and oils: Extra virgin olive oil. Avocado oil. Grape seed oil. Sweets and desserts: [place] yogurt with honey. Baked apples. Poached pears. Trail mix. Seasoning and other foods: Basil. Cilantro. Coriander. Cumin. Mint. Parsley. Sage. Rosemary. Tarragon. Garlic. Oregano. Thyme. Pepper. Balsalmic vinegar. Tahini. Hummus. Tomato sauce. Olives. Mushrooms. Limit these Grains: Prepackaged pasta or rice dishes. Prepackaged cereal with added sugar. Vegetables: Deep fried potatoes (french fries). Fruits: Fruit canned in syrup. Meats and other protein foods: Beef. Pork. Lamb. Poultry with skin. Hot dogs. Tomasa Blase. Dairy: Ice cream. Sour cream. Whole milk. Beverages: Juice. Sugar-sweetened soft drinks. Beer. Liquor and spirits. Fats and oils: Butter. Canola oil. Vegetable oil. Beef fat (tallow). Lard. Sweets and desserts: Cookies. Cakes. Pies. Candy. Seasoning and other foods: Mayonnaise. Premade sauces and marinades. The items listed may not be a complete list. Talk with your dietitian about what dietary choices are right for you. Summary The Mediterranean diet includes both food and lifestyle choices. Eat a variety of fresh fruits and vegetables, beans, nuts, seeds, and whole  grains. Limit the amount of red meat and sweets that you eat. Talk with your health care provider about whether it is safe for you to drink red wine in moderation. This means 1 glass a day for nonpregnant women and 2 glasses a day for men. A glass of wine equals 5 oz (150 mL). This information is not intended to replace advice given to you by your health  care provider. Make sure you discuss any questions you have with your health care provider. Document Released: 02/10/2016 Document Revised: 03/14/2016 Document Reviewed: 02/10/2016 Elsevier Interactive Patient Education  2017 [employer].    Labs today [address] M[facility] at [facility] [phone]

## 2023-04-16 DIAGNOSIS — N184 Chronic kidney disease, stage 4 (severe): Secondary | ICD-10-CM | POA: Diagnosis not present

## 2023-04-16 DIAGNOSIS — G609 Hereditary and idiopathic neuropathy, unspecified: Secondary | ICD-10-CM | POA: Diagnosis not present

## 2023-04-16 DIAGNOSIS — H409 Unspecified glaucoma: Secondary | ICD-10-CM | POA: Diagnosis not present

## 2023-04-16 DIAGNOSIS — G9341 Metabolic encephalopathy: Secondary | ICD-10-CM | POA: Diagnosis not present

## 2023-04-16 DIAGNOSIS — K219 Gastro-esophageal reflux disease without esophagitis: Secondary | ICD-10-CM | POA: Diagnosis not present

## 2023-04-16 DIAGNOSIS — F03911 Unspecified dementia, unspecified severity, with agitation: Secondary | ICD-10-CM | POA: Diagnosis not present

## 2023-04-16 DIAGNOSIS — M899 Disorder of bone, unspecified: Secondary | ICD-10-CM | POA: Diagnosis not present

## 2023-04-16 DIAGNOSIS — F03918 Unspecified dementia, unspecified severity, with other behavioral disturbance: Secondary | ICD-10-CM | POA: Diagnosis not present

## 2023-04-16 DIAGNOSIS — G4700 Insomnia, unspecified: Secondary | ICD-10-CM | POA: Diagnosis not present

## 2023-04-16 DIAGNOSIS — F330 Major depressive disorder, recurrent, mild: Secondary | ICD-10-CM | POA: Diagnosis not present

## 2023-04-16 DIAGNOSIS — F0393 Unspecified dementia, unspecified severity, with mood disturbance: Secondary | ICD-10-CM | POA: Diagnosis not present

## 2023-04-16 DIAGNOSIS — F0394 Unspecified dementia, unspecified severity, with anxiety: Secondary | ICD-10-CM | POA: Diagnosis not present

## 2023-04-16 DIAGNOSIS — R296 Repeated falls: Secondary | ICD-10-CM | POA: Diagnosis not present

## 2023-04-16 DIAGNOSIS — I1310 Hypertensive heart and chronic kidney disease without heart failure, with stage 1 through stage 4 chronic kidney disease, or unspecified chronic kidney disease: Secondary | ICD-10-CM | POA: Diagnosis not present

## 2023-04-16 DIAGNOSIS — D631 Anemia in chronic kidney disease: Secondary | ICD-10-CM | POA: Diagnosis not present

## 2023-04-16 DIAGNOSIS — F418 Other specified anxiety disorders: Secondary | ICD-10-CM | POA: Diagnosis not present

## 2023-04-16 DIAGNOSIS — F05 Delirium due to known physiological condition: Secondary | ICD-10-CM | POA: Diagnosis not present

## 2023-04-24 DIAGNOSIS — G4700 Insomnia, unspecified: Secondary | ICD-10-CM | POA: Diagnosis not present

## 2023-04-24 DIAGNOSIS — K219 Gastro-esophageal reflux disease without esophagitis: Secondary | ICD-10-CM | POA: Diagnosis not present

## 2023-04-24 DIAGNOSIS — F03918 Unspecified dementia, unspecified severity, with other behavioral disturbance: Secondary | ICD-10-CM | POA: Diagnosis not present

## 2023-04-24 DIAGNOSIS — G9341 Metabolic encephalopathy: Secondary | ICD-10-CM | POA: Diagnosis not present

## 2023-04-24 DIAGNOSIS — R296 Repeated falls: Secondary | ICD-10-CM | POA: Diagnosis not present

## 2023-04-24 DIAGNOSIS — H409 Unspecified glaucoma: Secondary | ICD-10-CM | POA: Diagnosis not present

## 2023-04-24 DIAGNOSIS — G609 Hereditary and idiopathic neuropathy, unspecified: Secondary | ICD-10-CM | POA: Diagnosis not present

## 2023-04-24 DIAGNOSIS — D631 Anemia in chronic kidney disease: Secondary | ICD-10-CM | POA: Diagnosis not present

## 2023-04-24 DIAGNOSIS — I1310 Hypertensive heart and chronic kidney disease without heart failure, with stage 1 through stage 4 chronic kidney disease, or unspecified chronic kidney disease: Secondary | ICD-10-CM | POA: Diagnosis not present

## 2023-04-24 DIAGNOSIS — N184 Chronic kidney disease, stage 4 (severe): Secondary | ICD-10-CM | POA: Diagnosis not present

## 2023-04-24 DIAGNOSIS — F418 Other specified anxiety disorders: Secondary | ICD-10-CM | POA: Diagnosis not present

## 2023-04-24 DIAGNOSIS — F0394 Unspecified dementia, unspecified severity, with anxiety: Secondary | ICD-10-CM | POA: Diagnosis not present

## 2023-04-24 DIAGNOSIS — F03911 Unspecified dementia, unspecified severity, with agitation: Secondary | ICD-10-CM | POA: Diagnosis not present

## 2023-04-24 DIAGNOSIS — F0393 Unspecified dementia, unspecified severity, with mood disturbance: Secondary | ICD-10-CM | POA: Diagnosis not present

## 2023-04-24 DIAGNOSIS — F05 Delirium due to known physiological condition: Secondary | ICD-10-CM | POA: Diagnosis not present

## 2023-04-24 DIAGNOSIS — F330 Major depressive disorder, recurrent, mild: Secondary | ICD-10-CM | POA: Diagnosis not present

## 2023-05-01 DIAGNOSIS — F05 Delirium due to known physiological condition: Secondary | ICD-10-CM | POA: Diagnosis not present

## 2023-05-01 DIAGNOSIS — F418 Other specified anxiety disorders: Secondary | ICD-10-CM | POA: Diagnosis not present

## 2023-05-01 DIAGNOSIS — N184 Chronic kidney disease, stage 4 (severe): Secondary | ICD-10-CM | POA: Diagnosis not present

## 2023-05-01 DIAGNOSIS — R296 Repeated falls: Secondary | ICD-10-CM | POA: Diagnosis not present

## 2023-05-01 DIAGNOSIS — K219 Gastro-esophageal reflux disease without esophagitis: Secondary | ICD-10-CM | POA: Diagnosis not present

## 2023-05-01 DIAGNOSIS — F330 Major depressive disorder, recurrent, mild: Secondary | ICD-10-CM | POA: Diagnosis not present

## 2023-05-01 DIAGNOSIS — G4700 Insomnia, unspecified: Secondary | ICD-10-CM | POA: Diagnosis not present

## 2023-05-01 DIAGNOSIS — F03911 Unspecified dementia, unspecified severity, with agitation: Secondary | ICD-10-CM | POA: Diagnosis not present

## 2023-05-01 DIAGNOSIS — G609 Hereditary and idiopathic neuropathy, unspecified: Secondary | ICD-10-CM | POA: Diagnosis not present

## 2023-05-01 DIAGNOSIS — F03918 Unspecified dementia, unspecified severity, with other behavioral disturbance: Secondary | ICD-10-CM | POA: Diagnosis not present

## 2023-05-01 DIAGNOSIS — I1310 Hypertensive heart and chronic kidney disease without heart failure, with stage 1 through stage 4 chronic kidney disease, or unspecified chronic kidney disease: Secondary | ICD-10-CM | POA: Diagnosis not present

## 2023-05-01 DIAGNOSIS — D631 Anemia in chronic kidney disease: Secondary | ICD-10-CM | POA: Diagnosis not present

## 2023-05-01 DIAGNOSIS — F0393 Unspecified dementia, unspecified severity, with mood disturbance: Secondary | ICD-10-CM | POA: Diagnosis not present

## 2023-05-01 DIAGNOSIS — F0394 Unspecified dementia, unspecified severity, with anxiety: Secondary | ICD-10-CM | POA: Diagnosis not present

## 2023-05-01 DIAGNOSIS — G9341 Metabolic encephalopathy: Secondary | ICD-10-CM | POA: Diagnosis not present

## 2023-05-01 DIAGNOSIS — H409 Unspecified glaucoma: Secondary | ICD-10-CM | POA: Diagnosis not present

## 2023-05-03 ENCOUNTER — Other Ambulatory Visit (HOSPITAL_COMMUNITY): Payer: Self-pay | Admitting: Psychiatry

## 2023-05-03 DIAGNOSIS — F03918 Unspecified dementia, unspecified severity, with other behavioral disturbance: Secondary | ICD-10-CM

## 2023-05-04 ENCOUNTER — Telehealth (HOSPITAL_COMMUNITY): Payer: Self-pay

## 2023-05-04 NOTE — Telephone Encounter (Signed)
Patients daughter is calling, she was wanting a refill on the Seroquel but I told her that she should already have a refill. She then said that she had just picked up an order and that the pharmacy had texted her to let her know that there were no more refills. She said they come in November and I told her that we would refill at that appointment. She wanted to let you know that she is giving her mom a 50 mg Seroquel in the afternoon and one at bedtime. She said this is really helping with the agitation at bedtime and helps with the sundowning.

## 2023-05-08 ENCOUNTER — Ambulatory Visit (HOSPITAL_COMMUNITY): Payer: Medicare Other | Admitting: Psychiatry

## 2023-05-08 DIAGNOSIS — R296 Repeated falls: Secondary | ICD-10-CM | POA: Diagnosis not present

## 2023-05-08 DIAGNOSIS — G4700 Insomnia, unspecified: Secondary | ICD-10-CM | POA: Diagnosis not present

## 2023-05-08 DIAGNOSIS — K219 Gastro-esophageal reflux disease without esophagitis: Secondary | ICD-10-CM | POA: Diagnosis not present

## 2023-05-08 DIAGNOSIS — F03911 Unspecified dementia, unspecified severity, with agitation: Secondary | ICD-10-CM | POA: Diagnosis not present

## 2023-05-08 DIAGNOSIS — D631 Anemia in chronic kidney disease: Secondary | ICD-10-CM | POA: Diagnosis not present

## 2023-05-08 DIAGNOSIS — F0394 Unspecified dementia, unspecified severity, with anxiety: Secondary | ICD-10-CM | POA: Diagnosis not present

## 2023-05-08 DIAGNOSIS — G9341 Metabolic encephalopathy: Secondary | ICD-10-CM | POA: Diagnosis not present

## 2023-05-08 DIAGNOSIS — F03918 Unspecified dementia, unspecified severity, with other behavioral disturbance: Secondary | ICD-10-CM | POA: Diagnosis not present

## 2023-05-08 DIAGNOSIS — F418 Other specified anxiety disorders: Secondary | ICD-10-CM | POA: Diagnosis not present

## 2023-05-08 DIAGNOSIS — F0393 Unspecified dementia, unspecified severity, with mood disturbance: Secondary | ICD-10-CM | POA: Diagnosis not present

## 2023-05-08 DIAGNOSIS — F05 Delirium due to known physiological condition: Secondary | ICD-10-CM | POA: Diagnosis not present

## 2023-05-08 DIAGNOSIS — G609 Hereditary and idiopathic neuropathy, unspecified: Secondary | ICD-10-CM | POA: Diagnosis not present

## 2023-05-08 DIAGNOSIS — I1310 Hypertensive heart and chronic kidney disease without heart failure, with stage 1 through stage 4 chronic kidney disease, or unspecified chronic kidney disease: Secondary | ICD-10-CM | POA: Diagnosis not present

## 2023-05-08 DIAGNOSIS — N184 Chronic kidney disease, stage 4 (severe): Secondary | ICD-10-CM | POA: Diagnosis not present

## 2023-05-08 DIAGNOSIS — F330 Major depressive disorder, recurrent, mild: Secondary | ICD-10-CM | POA: Diagnosis not present

## 2023-05-08 DIAGNOSIS — H409 Unspecified glaucoma: Secondary | ICD-10-CM | POA: Diagnosis not present

## 2023-05-16 DIAGNOSIS — F05 Delirium due to known physiological condition: Secondary | ICD-10-CM | POA: Diagnosis not present

## 2023-05-16 DIAGNOSIS — D631 Anemia in chronic kidney disease: Secondary | ICD-10-CM | POA: Diagnosis not present

## 2023-05-16 DIAGNOSIS — F03918 Unspecified dementia, unspecified severity, with other behavioral disturbance: Secondary | ICD-10-CM | POA: Diagnosis not present

## 2023-05-16 DIAGNOSIS — F0393 Unspecified dementia, unspecified severity, with mood disturbance: Secondary | ICD-10-CM | POA: Diagnosis not present

## 2023-05-16 DIAGNOSIS — F03911 Unspecified dementia, unspecified severity, with agitation: Secondary | ICD-10-CM | POA: Diagnosis not present

## 2023-05-16 DIAGNOSIS — G609 Hereditary and idiopathic neuropathy, unspecified: Secondary | ICD-10-CM | POA: Diagnosis not present

## 2023-05-16 DIAGNOSIS — H409 Unspecified glaucoma: Secondary | ICD-10-CM | POA: Diagnosis not present

## 2023-05-16 DIAGNOSIS — G4700 Insomnia, unspecified: Secondary | ICD-10-CM | POA: Diagnosis not present

## 2023-05-16 DIAGNOSIS — I1310 Hypertensive heart and chronic kidney disease without heart failure, with stage 1 through stage 4 chronic kidney disease, or unspecified chronic kidney disease: Secondary | ICD-10-CM | POA: Diagnosis not present

## 2023-05-16 DIAGNOSIS — F0394 Unspecified dementia, unspecified severity, with anxiety: Secondary | ICD-10-CM | POA: Diagnosis not present

## 2023-05-16 DIAGNOSIS — G9341 Metabolic encephalopathy: Secondary | ICD-10-CM | POA: Diagnosis not present

## 2023-05-16 DIAGNOSIS — F418 Other specified anxiety disorders: Secondary | ICD-10-CM | POA: Diagnosis not present

## 2023-05-16 DIAGNOSIS — N184 Chronic kidney disease, stage 4 (severe): Secondary | ICD-10-CM | POA: Diagnosis not present

## 2023-05-16 DIAGNOSIS — R296 Repeated falls: Secondary | ICD-10-CM | POA: Diagnosis not present

## 2023-05-16 DIAGNOSIS — K219 Gastro-esophageal reflux disease without esophagitis: Secondary | ICD-10-CM | POA: Diagnosis not present

## 2023-05-16 DIAGNOSIS — F330 Major depressive disorder, recurrent, mild: Secondary | ICD-10-CM | POA: Diagnosis not present

## 2023-05-25 DIAGNOSIS — G4700 Insomnia, unspecified: Secondary | ICD-10-CM | POA: Diagnosis not present

## 2023-05-25 DIAGNOSIS — F03911 Unspecified dementia, unspecified severity, with agitation: Secondary | ICD-10-CM | POA: Diagnosis not present

## 2023-05-25 DIAGNOSIS — F330 Major depressive disorder, recurrent, mild: Secondary | ICD-10-CM | POA: Diagnosis not present

## 2023-05-25 DIAGNOSIS — G9341 Metabolic encephalopathy: Secondary | ICD-10-CM | POA: Diagnosis not present

## 2023-05-25 DIAGNOSIS — H409 Unspecified glaucoma: Secondary | ICD-10-CM | POA: Diagnosis not present

## 2023-05-25 DIAGNOSIS — K219 Gastro-esophageal reflux disease without esophagitis: Secondary | ICD-10-CM | POA: Diagnosis not present

## 2023-05-25 DIAGNOSIS — F05 Delirium due to known physiological condition: Secondary | ICD-10-CM | POA: Diagnosis not present

## 2023-05-25 DIAGNOSIS — I1310 Hypertensive heart and chronic kidney disease without heart failure, with stage 1 through stage 4 chronic kidney disease, or unspecified chronic kidney disease: Secondary | ICD-10-CM | POA: Diagnosis not present

## 2023-05-25 DIAGNOSIS — F03918 Unspecified dementia, unspecified severity, with other behavioral disturbance: Secondary | ICD-10-CM | POA: Diagnosis not present

## 2023-05-25 DIAGNOSIS — F418 Other specified anxiety disorders: Secondary | ICD-10-CM | POA: Diagnosis not present

## 2023-05-25 DIAGNOSIS — R296 Repeated falls: Secondary | ICD-10-CM | POA: Diagnosis not present

## 2023-05-25 DIAGNOSIS — F0394 Unspecified dementia, unspecified severity, with anxiety: Secondary | ICD-10-CM | POA: Diagnosis not present

## 2023-05-25 DIAGNOSIS — N184 Chronic kidney disease, stage 4 (severe): Secondary | ICD-10-CM | POA: Diagnosis not present

## 2023-05-25 DIAGNOSIS — G609 Hereditary and idiopathic neuropathy, unspecified: Secondary | ICD-10-CM | POA: Diagnosis not present

## 2023-05-25 DIAGNOSIS — D631 Anemia in chronic kidney disease: Secondary | ICD-10-CM | POA: Diagnosis not present

## 2023-05-25 DIAGNOSIS — F0393 Unspecified dementia, unspecified severity, with mood disturbance: Secondary | ICD-10-CM | POA: Diagnosis not present

## 2023-05-28 NOTE — Progress Notes (Unsigned)
BH MD/PA/NP OP Progress Note  05/29/2023 9:23 AM Nancy Suarez  MRN:  [id number]  Visit Diagnosis:    ICD-10-CM   1. Dementia with behavioral disturbance (HCC)  F03.918     2. Recurrent major depressive disorder, remission status unspecified (HCC)  F33.9       Assessment:  Nancy Suarez is a 89 y.o. female with a history of depression, dementia with behavioral disturbance, chronic IDA, HLD, CKD stage 3 who presented to [facility] at [facility] for initial evaluation on 07/21/2022.  At initial evaluation patient reported symptoms of poor memory, visual hallucinations, behavioral disturbance, and depression.  During initial exam patient was found to only be oriented to person and month, while she was unsure of the day, year, and her location.  She was noted to be disorganized and had difficulty answering questions coherently at times.  Collateral obtained from the daughter confirmed that patient has had gradually worsening cognitive function and memory with a notable decline following her hospitalization in August 2023.  Patient has been experiencing episodes of sundowning and delirium in the afternoon/evenings.  Patient is also experiencing visual hallucinations that began after the memory decline.  There is a family history of Alzheimer's and the patient's brother.  Of note patient's MMSE on 06/19/22 was 15 and has been trending down over the past year.  Head CT from September 2023 showed recovering subdural hematoma, subacute anterior left frontal infarct, underlying atrophy with chronic small vessel ischemic change, and recovering periorbital hematoma.  At this time patient meets criteria for dementia with behavioral disturbance and MDD.  The specific nature of her dementia is unclear though there are signs consistent with Lewy body dementia.  We would recommend continuing to coordinate with neurology to discuss neurocognitive testing and possible addition of cholinesterase  inhibitor medications to help modulate dementia symptoms.  [name] presents for follow-up evaluation. Today, 05/29/23, patient reports some episodes of tearfulness often related to memory loss and being able to perceive this.  Support was provided around this topic.  Patient is continued on her medication regularly and denies any adverse side effects.  We will continue on her current regimen and follow-up in 6 weeks  Plan: - Continue Seroquel 50 mg QHS - Continue Depakote 125 mg QID - Continue Lexapro 15 mg QD - Continue Remeron 7.5 mg at bedtime - Continue Memantine 5 mg QHS managed by neurology - CMP, CBC, UA, Depakote level reviewed - CT head w/o contrast from 03/24/22 reviewed - Started physical therapy - Crisis resources reviewed - Follow up in 6 weeks  Chief Complaint:  Chief Complaint  Patient presents with   Follow-up   HPI: Nancy Suarez presents alongside her daughter.  Per her daughter patient has been doing well in there interim other then a period 2 weeks ago when she had stopped eating.  Her daughter notes that she went on a trip and Roxanne was left home with her husband and the rest of the family.  During that time Nancy Suarez did very well listening to her family, not getting irritable, and sleeping well during the night.  During interview today there was a period where Nancy Suarez became tearful due to declining memory.  We discussed this and provided education and support around the topic.  Also provided some education for her daughter on how to manage this.  Patient reports eating and drinking well though has some potential weight loss.  That said we did encourage daughter to start nutritional shakes.  Engagement wise Nancy Suarez has started physical therapy recently.  She also is looking forward to seeing her family for the holidays.  Patient has taken her medication consistently without any adverse side effects.  Past Psychiatric History: Patient has connected with a psychiatrist  several decades ago for depressed mood.  She denies any prior psychiatric hospitalizations.  It is unclear whether she had any suicide attempts as her mother did report 1 however her daughter had been unaware of the accuracy.  Her daughter did note her mother tried to throw herself down the stairs sometime in the last year.  Medications patient has tried Depakote, Seroquel, Risperdal, Haldol, Lexapro, Remeron, Atarax, Ativan, memantine, and Ambien.  Of these patient reports Risperdal was oversedating.  Remeron, Atarax, Ativan, and Ambien were discontinued due to adverse effects in elderly and dementia patient.  Denies any substance use  Past Medical History:  Past Medical History:  Diagnosis Date   Allergic rhinitis    ASCUS (atypical squamous cells of undetermined significance) on Pap smear    Atrophic vaginitis    Chronic insomnia    Depression    GERD (gastroesophageal reflux disease)    Goiter    History of kidney stones    Hypercholesteremia    Hypertension    LGSIL (low grade squamous intraepithelial dysplasia)    Osteopenia    Ovarian cyst    "shrank it"   Pancreatitis    Scoliosis    Spinal arthritis     Past Surgical History:  Procedure Laterality Date   ABDOMINAL HYSTERECTOMY  1977   partial   INTRAMEDULLARY (IM) NAIL INTERTROCHANTERIC Right 11/28/2021   Procedure: INTRAMEDULLARY (IM) NAIL INTERTROCHANTRIC;  Surgeon: Samson Frederic, MD;  Location: [facility];  Service: Orthopedics;  Laterality: Right;   KIDNEY STONE SURGERY  1990's   2-3 stones x1 surgery   ORIF FEMUR FRACTURE Left 06/09/2017   Procedure: OPEN REDUCTION INTERNAL FIXATION (ORIF) DISTAL FEMUR FRACTURE;  Surgeon: Durene Romans, MD;  Location: [facility];  Service: Orthopedics;  Laterality: Left;   TONSILLECTOMY  as teenager   TOTAL KNEE ARTHROPLASTY Left 01/19/2014   Procedure: LEFT TOTAL KNEE ARTHROPLASTY;  Surgeon: Shelda Pal, MD;  Location: [facility];  Service: Orthopedics;  Laterality: Left;   WISDOM TOOTH  EXTRACTION     Family History:  Family History  Problem Relation Age of Onset   Hypertension Father    Cancer Father    Cancer Mother        Bone   Cancer Brother     Social History:  Social History   Socioeconomic History   Marital status: Widowed    Spouse name: Not on file   Number of children: 1   Years of education: Not on file   Highest education level: Bachelor's degree (e.g., BA, AB, BS)  Occupational History   Not on file  Tobacco Use   Smoking status: Former    Current packs/day: 0.00    Types: Cigarettes    Start date: 07/03/1958    Quit date: 07/03/1978    Years since quitting: 44.9   Smokeless tobacco: Former  Building services engineer status: Never Used  Substance and Sexual Activity   Alcohol use: Yes    Comment: occ   Drug use: No   Sexual activity: Yes    Birth control/protection: Surgical    Comment: hysterectomy  Other Topics Concern   Not on file  Social History Narrative   Lives at home with daughter, Byrd Hesselbach   Right  handed   Caffeine: at the most 1 cup/day   Social Determinants of Health   Financial Resource Strain: Not on file  Food Insecurity: Not on file  Transportation Needs: Not on file  Physical Activity: Not on file  Stress: Not on file  Social Connections: Not on file    Allergies:  Allergies  Allergen Reactions   Ambien [Zolpidem] Other (See Comments)    Disorientation Aggression    Aspirin Other (See Comments)    Upsets stomach if 325mg    Codeine Nausea And Vomiting   Namenda [Memantine] Other (See Comments)    Agitation Aggression    Percocet [Oxycodone-Acetaminophen] Other (See Comments)    Disorientation Aggression     Sulfa Antibiotics Nausea Only    Current Medications: Current Outpatient Medications  Medication Sig Dispense Refill   acetaminophen (TYLENOL) 500 MG tablet Take 2 tablets (1,000 mg total) by mouth 2 (two) times daily. (Patient taking differently: Take 1,000 mg by mouth See admin instructions. 1000 mg  twice daily (morning and evening) + an additional 500 mg during the day as needed for pain.) 30 tablet 0   atorvastatin (LIPITOR) 20 MG tablet Take 20 mg by mouth daily.     carboxymethylcellulose (REFRESH PLUS) 0.5 % SOLN Place 1 drop into both eyes in the morning, at noon, and at bedtime.     carvedilol (COREG) 3.125 MG tablet Take 1 tablet (3.125 mg total) by mouth 2 (two) times daily with a meal. 60 tablet 0   divalproex (DEPAKOTE SPRINKLE) 125 MG capsule Take 1 capsule (125 mg total) by mouth in the morning, at noon, in the evening, and at bedtime. 360 capsule 1   docusate sodium (COLACE) 100 MG capsule Take 100 mg by mouth daily as needed for mild constipation.     escitalopram (LEXAPRO) 10 MG tablet Take 1.5 tablets (15 mg total) by mouth daily. 135 tablet 1   famotidine (PEPCID) 20 MG tablet Take 1 tablet (20 mg total) by mouth daily. 30 tablet 0   Fexofenadine HCl (ALLEGRA ALLERGY PO) Take 1 tablet by mouth daily.     fluticasone (FLONASE) 50 MCG/ACT nasal spray Place 1-2 sprays into both nostrils daily as needed for allergies.     MELATONIN PO Take 10 mg by mouth at bedtime.     memantine (NAMENDA) 5 MG tablet Take 1 tablet (5 mg at night) for 2 weeks, then increase to 1 tablet (5 mg) twice a day (Patient taking differently: Take 1 tablet (5 mg ) daily) 60 tablet 11   mirtazapine (REMERON) 15 MG tablet Take 0.5 tablets (7.5 mg total) by mouth at bedtime. 45 tablet 1   Multiple Vitamins-Minerals (MULTIVITAMIN ADULT) CHEW Chew 1 tablet by mouth daily.     QUEtiapine (SEROQUEL) 50 MG tablet Take 1 tablet (50 mg total) by mouth at bedtime. 90 tablet 1   No current facility-administered medications for this visit.     Musculoskeletal: Strength & Muscle Tone: within normal limits Gait & Station:  wheelchair bound Patient leans: N/A  Psychiatric Specialty Exam: Review of Systems  Blood pressure (!) 180/88, pulse 75, height 5\' 5"  (1.651 m), weight 128 lb (58.1 kg).Body mass index is 21.3  kg/m.  General Appearance: Fairly Groomed  Eye Contact:  Fair  Speech:  Clear and Coherent and Normal Rate  Volume:  Normal  Mood:  Euthymic and dysthymic  Affect:  Congruent and Tearful  Thought Process:  Coherent  Orientation:  Full (Time, Place, and Person)  Thought Content:  Vague   Suicidal Thoughts:  No  Homicidal Thoughts:  No  Memory:  Immediate;   Poor Recent;   Poor Remote;   Poor  Judgement:  Impaired  Insight:  Lacking  Psychomotor Activity:  Decreased  Concentration:  Concentration: Poor  Recall:  Poor  Fund of Knowledge: Fair  Language: Fair  Akathisia:  No    AIMS (if indicated): done  Assets:  Architect Housing Social Support  ADL's:  Impaired  Cognition: Impaired,  Moderate  Sleep:  Good   Metabolic Disorder Labs: Lab Results  Component Value Date   HGBA1C 4.9 09/06/2020   No results found for: "PROLACTIN" No results found for: "CHOL", "TRIG", "HDL", "CHOLHDL", "VLDL", "LDLCALC" Lab Results  Component Value Date   TSH 1.22 12/29/2022   TSH 3.261 03/12/2022    Therapeutic Level Labs: No results found for: "LITHIUM" Lab Results  Component Value Date   VALPROATE 46 (L) 03/26/2022   No results found for: "CBMZ"   Screenings: Mini-Mental    Flowsheet Row Office Visit from 12/29/2022 in [facility] Office Visit from 06/19/2022 in [facility] Office Visit from 02/14/2022 in [facility] Office Visit from 06/07/2021 in [facility] Office Visit from 03/01/2021 in [facility]  Total Score (max 30 points ) 15 15 17 21 21       [id number]    Flowsheet Row Office Visit from 12/10/2017 in [facility] Care at [facility] Total Score 0      Flowsheet Row ED to Hosp-Admission (Discharged) from 03/12/2022 in [facility] to Hosp-Admission (Discharged) from  02/28/2022 in [facility] from 02/02/2022 in [facility] Emergency Department at [facility]  C-SSRS RISK CATEGORY No Risk No Risk No Risk       Collaboration of Care: Collaboration of Care: Medication Management AEB medication prescription and Other provider involved in patient's care AEB neurology chart review  30 minutes were spent in chart review, interview, psycho education, counseling, medical decision making, coordination of care and long-term prognosis.  Patient was given opportunity to ask question and all concerns and questions were addressed and answers. Excluding separately billable services.   Patient/Guardian was advised Release of Information must be obtained prior to any record release in order to collaborate their care with an outside provider. Patient/Guardian was advised if they have not already done so to contact the registration department to sign all necessary forms in order for [place] to release information regarding their care.   Consent: Patient/Guardian gives verbal consent for treatment and assignment of benefits for services provided during this visit. Patient/Guardian expressed understanding and agreed to proceed.    Stasia Cavalier, MD 05/29/2023, 9:23 AM

## 2023-05-29 ENCOUNTER — Ambulatory Visit (HOSPITAL_COMMUNITY): Payer: Medicare Other | Admitting: Psychiatry

## 2023-05-29 ENCOUNTER — Encounter (HOSPITAL_COMMUNITY): Payer: Self-pay | Admitting: Psychiatry

## 2023-05-29 VITALS — BP 180/88 | HR 75 | Ht 65.0 in | Wt 128.0 lb

## 2023-05-29 DIAGNOSIS — F03918 Unspecified dementia, unspecified severity, with other behavioral disturbance: Secondary | ICD-10-CM | POA: Diagnosis not present

## 2023-05-29 DIAGNOSIS — F339 Major depressive disorder, recurrent, unspecified: Secondary | ICD-10-CM | POA: Diagnosis not present

## 2023-05-30 DIAGNOSIS — G609 Hereditary and idiopathic neuropathy, unspecified: Secondary | ICD-10-CM | POA: Diagnosis not present

## 2023-05-30 DIAGNOSIS — N184 Chronic kidney disease, stage 4 (severe): Secondary | ICD-10-CM | POA: Diagnosis not present

## 2023-05-30 DIAGNOSIS — F05 Delirium due to known physiological condition: Secondary | ICD-10-CM | POA: Diagnosis not present

## 2023-05-30 DIAGNOSIS — F330 Major depressive disorder, recurrent, mild: Secondary | ICD-10-CM | POA: Diagnosis not present

## 2023-05-30 DIAGNOSIS — F418 Other specified anxiety disorders: Secondary | ICD-10-CM | POA: Diagnosis not present

## 2023-05-30 DIAGNOSIS — R296 Repeated falls: Secondary | ICD-10-CM | POA: Diagnosis not present

## 2023-05-30 DIAGNOSIS — G4700 Insomnia, unspecified: Secondary | ICD-10-CM | POA: Diagnosis not present

## 2023-05-30 DIAGNOSIS — F0393 Unspecified dementia, unspecified severity, with mood disturbance: Secondary | ICD-10-CM | POA: Diagnosis not present

## 2023-05-30 DIAGNOSIS — F0394 Unspecified dementia, unspecified severity, with anxiety: Secondary | ICD-10-CM | POA: Diagnosis not present

## 2023-05-30 DIAGNOSIS — H409 Unspecified glaucoma: Secondary | ICD-10-CM | POA: Diagnosis not present

## 2023-05-30 DIAGNOSIS — G9341 Metabolic encephalopathy: Secondary | ICD-10-CM | POA: Diagnosis not present

## 2023-05-30 DIAGNOSIS — F03911 Unspecified dementia, unspecified severity, with agitation: Secondary | ICD-10-CM | POA: Diagnosis not present

## 2023-05-30 DIAGNOSIS — I1310 Hypertensive heart and chronic kidney disease without heart failure, with stage 1 through stage 4 chronic kidney disease, or unspecified chronic kidney disease: Secondary | ICD-10-CM | POA: Diagnosis not present

## 2023-05-30 DIAGNOSIS — F03918 Unspecified dementia, unspecified severity, with other behavioral disturbance: Secondary | ICD-10-CM | POA: Diagnosis not present

## 2023-05-30 DIAGNOSIS — D631 Anemia in chronic kidney disease: Secondary | ICD-10-CM | POA: Diagnosis not present

## 2023-05-30 DIAGNOSIS — K219 Gastro-esophageal reflux disease without esophagitis: Secondary | ICD-10-CM | POA: Diagnosis not present

## 2023-06-05 DIAGNOSIS — F0393 Unspecified dementia, unspecified severity, with mood disturbance: Secondary | ICD-10-CM | POA: Diagnosis not present

## 2023-06-05 DIAGNOSIS — F330 Major depressive disorder, recurrent, mild: Secondary | ICD-10-CM | POA: Diagnosis not present

## 2023-06-05 DIAGNOSIS — D631 Anemia in chronic kidney disease: Secondary | ICD-10-CM | POA: Diagnosis not present

## 2023-06-05 DIAGNOSIS — H409 Unspecified glaucoma: Secondary | ICD-10-CM | POA: Diagnosis not present

## 2023-06-05 DIAGNOSIS — F03911 Unspecified dementia, unspecified severity, with agitation: Secondary | ICD-10-CM | POA: Diagnosis not present

## 2023-06-05 DIAGNOSIS — G9341 Metabolic encephalopathy: Secondary | ICD-10-CM | POA: Diagnosis not present

## 2023-06-05 DIAGNOSIS — G4700 Insomnia, unspecified: Secondary | ICD-10-CM | POA: Diagnosis not present

## 2023-06-05 DIAGNOSIS — F05 Delirium due to known physiological condition: Secondary | ICD-10-CM | POA: Diagnosis not present

## 2023-06-05 DIAGNOSIS — K219 Gastro-esophageal reflux disease without esophagitis: Secondary | ICD-10-CM | POA: Diagnosis not present

## 2023-06-05 DIAGNOSIS — G609 Hereditary and idiopathic neuropathy, unspecified: Secondary | ICD-10-CM | POA: Diagnosis not present

## 2023-06-05 DIAGNOSIS — F0394 Unspecified dementia, unspecified severity, with anxiety: Secondary | ICD-10-CM | POA: Diagnosis not present

## 2023-06-05 DIAGNOSIS — F03918 Unspecified dementia, unspecified severity, with other behavioral disturbance: Secondary | ICD-10-CM | POA: Diagnosis not present

## 2023-06-05 DIAGNOSIS — F418 Other specified anxiety disorders: Secondary | ICD-10-CM | POA: Diagnosis not present

## 2023-06-05 DIAGNOSIS — R296 Repeated falls: Secondary | ICD-10-CM | POA: Diagnosis not present

## 2023-06-05 DIAGNOSIS — N184 Chronic kidney disease, stage 4 (severe): Secondary | ICD-10-CM | POA: Diagnosis not present

## 2023-06-05 DIAGNOSIS — I1310 Hypertensive heart and chronic kidney disease without heart failure, with stage 1 through stage 4 chronic kidney disease, or unspecified chronic kidney disease: Secondary | ICD-10-CM | POA: Diagnosis not present

## 2023-06-13 DIAGNOSIS — N184 Chronic kidney disease, stage 4 (severe): Secondary | ICD-10-CM | POA: Diagnosis not present

## 2023-06-13 DIAGNOSIS — G4700 Insomnia, unspecified: Secondary | ICD-10-CM | POA: Diagnosis not present

## 2023-06-13 DIAGNOSIS — F05 Delirium due to known physiological condition: Secondary | ICD-10-CM | POA: Diagnosis not present

## 2023-06-13 DIAGNOSIS — K219 Gastro-esophageal reflux disease without esophagitis: Secondary | ICD-10-CM | POA: Diagnosis not present

## 2023-06-13 DIAGNOSIS — G9341 Metabolic encephalopathy: Secondary | ICD-10-CM | POA: Diagnosis not present

## 2023-06-13 DIAGNOSIS — F0394 Unspecified dementia, unspecified severity, with anxiety: Secondary | ICD-10-CM | POA: Diagnosis not present

## 2023-06-13 DIAGNOSIS — H409 Unspecified glaucoma: Secondary | ICD-10-CM | POA: Diagnosis not present

## 2023-06-13 DIAGNOSIS — I1310 Hypertensive heart and chronic kidney disease without heart failure, with stage 1 through stage 4 chronic kidney disease, or unspecified chronic kidney disease: Secondary | ICD-10-CM | POA: Diagnosis not present

## 2023-06-13 DIAGNOSIS — F330 Major depressive disorder, recurrent, mild: Secondary | ICD-10-CM | POA: Diagnosis not present

## 2023-06-13 DIAGNOSIS — D631 Anemia in chronic kidney disease: Secondary | ICD-10-CM | POA: Diagnosis not present

## 2023-06-13 DIAGNOSIS — F0393 Unspecified dementia, unspecified severity, with mood disturbance: Secondary | ICD-10-CM | POA: Diagnosis not present

## 2023-06-13 DIAGNOSIS — F418 Other specified anxiety disorders: Secondary | ICD-10-CM | POA: Diagnosis not present

## 2023-06-13 DIAGNOSIS — F03911 Unspecified dementia, unspecified severity, with agitation: Secondary | ICD-10-CM | POA: Diagnosis not present

## 2023-06-13 DIAGNOSIS — G609 Hereditary and idiopathic neuropathy, unspecified: Secondary | ICD-10-CM | POA: Diagnosis not present

## 2023-06-13 DIAGNOSIS — F03918 Unspecified dementia, unspecified severity, with other behavioral disturbance: Secondary | ICD-10-CM | POA: Diagnosis not present

## 2023-06-13 DIAGNOSIS — R296 Repeated falls: Secondary | ICD-10-CM | POA: Diagnosis not present

## 2023-06-15 DIAGNOSIS — N184 Chronic kidney disease, stage 4 (severe): Secondary | ICD-10-CM | POA: Diagnosis not present

## 2023-06-15 DIAGNOSIS — F05 Delirium due to known physiological condition: Secondary | ICD-10-CM | POA: Diagnosis not present

## 2023-06-15 DIAGNOSIS — R296 Repeated falls: Secondary | ICD-10-CM | POA: Diagnosis not present

## 2023-06-15 DIAGNOSIS — F418 Other specified anxiety disorders: Secondary | ICD-10-CM | POA: Diagnosis not present

## 2023-06-15 DIAGNOSIS — K219 Gastro-esophageal reflux disease without esophagitis: Secondary | ICD-10-CM | POA: Diagnosis not present

## 2023-06-15 DIAGNOSIS — F0393 Unspecified dementia, unspecified severity, with mood disturbance: Secondary | ICD-10-CM | POA: Diagnosis not present

## 2023-06-15 DIAGNOSIS — D631 Anemia in chronic kidney disease: Secondary | ICD-10-CM | POA: Diagnosis not present

## 2023-06-15 DIAGNOSIS — F03918 Unspecified dementia, unspecified severity, with other behavioral disturbance: Secondary | ICD-10-CM | POA: Diagnosis not present

## 2023-06-15 DIAGNOSIS — F330 Major depressive disorder, recurrent, mild: Secondary | ICD-10-CM | POA: Diagnosis not present

## 2023-06-15 DIAGNOSIS — H409 Unspecified glaucoma: Secondary | ICD-10-CM | POA: Diagnosis not present

## 2023-06-15 DIAGNOSIS — F03911 Unspecified dementia, unspecified severity, with agitation: Secondary | ICD-10-CM | POA: Diagnosis not present

## 2023-06-15 DIAGNOSIS — I1310 Hypertensive heart and chronic kidney disease without heart failure, with stage 1 through stage 4 chronic kidney disease, or unspecified chronic kidney disease: Secondary | ICD-10-CM | POA: Diagnosis not present

## 2023-06-15 DIAGNOSIS — G609 Hereditary and idiopathic neuropathy, unspecified: Secondary | ICD-10-CM | POA: Diagnosis not present

## 2023-06-15 DIAGNOSIS — G4700 Insomnia, unspecified: Secondary | ICD-10-CM | POA: Diagnosis not present

## 2023-06-15 DIAGNOSIS — M1990 Unspecified osteoarthritis, unspecified site: Secondary | ICD-10-CM | POA: Diagnosis not present

## 2023-06-15 DIAGNOSIS — F0394 Unspecified dementia, unspecified severity, with anxiety: Secondary | ICD-10-CM | POA: Diagnosis not present

## 2023-06-21 DIAGNOSIS — M1711 Unilateral primary osteoarthritis, right knee: Secondary | ICD-10-CM | POA: Diagnosis not present

## 2023-06-22 DIAGNOSIS — F05 Delirium due to known physiological condition: Secondary | ICD-10-CM | POA: Diagnosis not present

## 2023-06-22 DIAGNOSIS — F418 Other specified anxiety disorders: Secondary | ICD-10-CM | POA: Diagnosis not present

## 2023-06-22 DIAGNOSIS — H409 Unspecified glaucoma: Secondary | ICD-10-CM | POA: Diagnosis not present

## 2023-06-22 DIAGNOSIS — F03911 Unspecified dementia, unspecified severity, with agitation: Secondary | ICD-10-CM | POA: Diagnosis not present

## 2023-06-22 DIAGNOSIS — K219 Gastro-esophageal reflux disease without esophagitis: Secondary | ICD-10-CM | POA: Diagnosis not present

## 2023-06-22 DIAGNOSIS — N184 Chronic kidney disease, stage 4 (severe): Secondary | ICD-10-CM | POA: Diagnosis not present

## 2023-06-22 DIAGNOSIS — M1990 Unspecified osteoarthritis, unspecified site: Secondary | ICD-10-CM | POA: Diagnosis not present

## 2023-06-22 DIAGNOSIS — F330 Major depressive disorder, recurrent, mild: Secondary | ICD-10-CM | POA: Diagnosis not present

## 2023-06-22 DIAGNOSIS — G609 Hereditary and idiopathic neuropathy, unspecified: Secondary | ICD-10-CM | POA: Diagnosis not present

## 2023-06-22 DIAGNOSIS — D631 Anemia in chronic kidney disease: Secondary | ICD-10-CM | POA: Diagnosis not present

## 2023-06-22 DIAGNOSIS — G4700 Insomnia, unspecified: Secondary | ICD-10-CM | POA: Diagnosis not present

## 2023-06-22 DIAGNOSIS — R296 Repeated falls: Secondary | ICD-10-CM | POA: Diagnosis not present

## 2023-06-22 DIAGNOSIS — F0394 Unspecified dementia, unspecified severity, with anxiety: Secondary | ICD-10-CM | POA: Diagnosis not present

## 2023-06-22 DIAGNOSIS — F03918 Unspecified dementia, unspecified severity, with other behavioral disturbance: Secondary | ICD-10-CM | POA: Diagnosis not present

## 2023-06-22 DIAGNOSIS — I1310 Hypertensive heart and chronic kidney disease without heart failure, with stage 1 through stage 4 chronic kidney disease, or unspecified chronic kidney disease: Secondary | ICD-10-CM | POA: Diagnosis not present

## 2023-06-22 DIAGNOSIS — F0393 Unspecified dementia, unspecified severity, with mood disturbance: Secondary | ICD-10-CM | POA: Diagnosis not present

## 2023-07-02 DIAGNOSIS — I1310 Hypertensive heart and chronic kidney disease without heart failure, with stage 1 through stage 4 chronic kidney disease, or unspecified chronic kidney disease: Secondary | ICD-10-CM | POA: Diagnosis not present

## 2023-07-02 DIAGNOSIS — N184 Chronic kidney disease, stage 4 (severe): Secondary | ICD-10-CM | POA: Diagnosis not present

## 2023-07-02 DIAGNOSIS — R296 Repeated falls: Secondary | ICD-10-CM | POA: Diagnosis not present

## 2023-07-02 DIAGNOSIS — F05 Delirium due to known physiological condition: Secondary | ICD-10-CM | POA: Diagnosis not present

## 2023-07-02 DIAGNOSIS — G4700 Insomnia, unspecified: Secondary | ICD-10-CM | POA: Diagnosis not present

## 2023-07-02 DIAGNOSIS — F330 Major depressive disorder, recurrent, mild: Secondary | ICD-10-CM | POA: Diagnosis not present

## 2023-07-02 DIAGNOSIS — F418 Other specified anxiety disorders: Secondary | ICD-10-CM | POA: Diagnosis not present

## 2023-07-02 DIAGNOSIS — H409 Unspecified glaucoma: Secondary | ICD-10-CM | POA: Diagnosis not present

## 2023-07-02 DIAGNOSIS — M1990 Unspecified osteoarthritis, unspecified site: Secondary | ICD-10-CM | POA: Diagnosis not present

## 2023-07-02 DIAGNOSIS — F0394 Unspecified dementia, unspecified severity, with anxiety: Secondary | ICD-10-CM | POA: Diagnosis not present

## 2023-07-02 DIAGNOSIS — G609 Hereditary and idiopathic neuropathy, unspecified: Secondary | ICD-10-CM | POA: Diagnosis not present

## 2023-07-02 DIAGNOSIS — F03918 Unspecified dementia, unspecified severity, with other behavioral disturbance: Secondary | ICD-10-CM | POA: Diagnosis not present

## 2023-07-02 DIAGNOSIS — F03911 Unspecified dementia, unspecified severity, with agitation: Secondary | ICD-10-CM | POA: Diagnosis not present

## 2023-07-02 DIAGNOSIS — D631 Anemia in chronic kidney disease: Secondary | ICD-10-CM | POA: Diagnosis not present

## 2023-07-02 DIAGNOSIS — K219 Gastro-esophageal reflux disease without esophagitis: Secondary | ICD-10-CM | POA: Diagnosis not present

## 2023-07-02 DIAGNOSIS — F0393 Unspecified dementia, unspecified severity, with mood disturbance: Secondary | ICD-10-CM | POA: Diagnosis not present

## 2023-07-10 DIAGNOSIS — I1310 Hypertensive heart and chronic kidney disease without heart failure, with stage 1 through stage 4 chronic kidney disease, or unspecified chronic kidney disease: Secondary | ICD-10-CM | POA: Diagnosis not present

## 2023-07-10 DIAGNOSIS — N184 Chronic kidney disease, stage 4 (severe): Secondary | ICD-10-CM | POA: Diagnosis not present

## 2023-07-10 DIAGNOSIS — G4700 Insomnia, unspecified: Secondary | ICD-10-CM | POA: Diagnosis not present

## 2023-07-10 DIAGNOSIS — F418 Other specified anxiety disorders: Secondary | ICD-10-CM | POA: Diagnosis not present

## 2023-07-10 DIAGNOSIS — D631 Anemia in chronic kidney disease: Secondary | ICD-10-CM | POA: Diagnosis not present

## 2023-07-10 DIAGNOSIS — F0393 Unspecified dementia, unspecified severity, with mood disturbance: Secondary | ICD-10-CM | POA: Diagnosis not present

## 2023-07-10 DIAGNOSIS — F330 Major depressive disorder, recurrent, mild: Secondary | ICD-10-CM | POA: Diagnosis not present

## 2023-07-10 DIAGNOSIS — M1990 Unspecified osteoarthritis, unspecified site: Secondary | ICD-10-CM | POA: Diagnosis not present

## 2023-07-10 DIAGNOSIS — G609 Hereditary and idiopathic neuropathy, unspecified: Secondary | ICD-10-CM | POA: Diagnosis not present

## 2023-07-10 DIAGNOSIS — F05 Delirium due to known physiological condition: Secondary | ICD-10-CM | POA: Diagnosis not present

## 2023-07-10 DIAGNOSIS — F03918 Unspecified dementia, unspecified severity, with other behavioral disturbance: Secondary | ICD-10-CM | POA: Diagnosis not present

## 2023-07-10 DIAGNOSIS — H409 Unspecified glaucoma: Secondary | ICD-10-CM | POA: Diagnosis not present

## 2023-07-10 DIAGNOSIS — F03911 Unspecified dementia, unspecified severity, with agitation: Secondary | ICD-10-CM | POA: Diagnosis not present

## 2023-07-10 DIAGNOSIS — F0394 Unspecified dementia, unspecified severity, with anxiety: Secondary | ICD-10-CM | POA: Diagnosis not present

## 2023-07-10 DIAGNOSIS — K219 Gastro-esophageal reflux disease without esophagitis: Secondary | ICD-10-CM | POA: Diagnosis not present

## 2023-07-10 DIAGNOSIS — R296 Repeated falls: Secondary | ICD-10-CM | POA: Diagnosis not present

## 2023-07-15 DIAGNOSIS — M1990 Unspecified osteoarthritis, unspecified site: Secondary | ICD-10-CM | POA: Diagnosis not present

## 2023-07-15 DIAGNOSIS — N184 Chronic kidney disease, stage 4 (severe): Secondary | ICD-10-CM | POA: Diagnosis not present

## 2023-07-15 DIAGNOSIS — F03911 Unspecified dementia, unspecified severity, with agitation: Secondary | ICD-10-CM | POA: Diagnosis not present

## 2023-07-15 DIAGNOSIS — F0393 Unspecified dementia, unspecified severity, with mood disturbance: Secondary | ICD-10-CM | POA: Diagnosis not present

## 2023-07-15 DIAGNOSIS — F0394 Unspecified dementia, unspecified severity, with anxiety: Secondary | ICD-10-CM | POA: Diagnosis not present

## 2023-07-15 DIAGNOSIS — F418 Other specified anxiety disorders: Secondary | ICD-10-CM | POA: Diagnosis not present

## 2023-07-15 DIAGNOSIS — H409 Unspecified glaucoma: Secondary | ICD-10-CM | POA: Diagnosis not present

## 2023-07-15 DIAGNOSIS — F05 Delirium due to known physiological condition: Secondary | ICD-10-CM | POA: Diagnosis not present

## 2023-07-15 DIAGNOSIS — D631 Anemia in chronic kidney disease: Secondary | ICD-10-CM | POA: Diagnosis not present

## 2023-07-15 DIAGNOSIS — F03918 Unspecified dementia, unspecified severity, with other behavioral disturbance: Secondary | ICD-10-CM | POA: Diagnosis not present

## 2023-07-15 DIAGNOSIS — G609 Hereditary and idiopathic neuropathy, unspecified: Secondary | ICD-10-CM | POA: Diagnosis not present

## 2023-07-15 DIAGNOSIS — K219 Gastro-esophageal reflux disease without esophagitis: Secondary | ICD-10-CM | POA: Diagnosis not present

## 2023-07-15 DIAGNOSIS — I1310 Hypertensive heart and chronic kidney disease without heart failure, with stage 1 through stage 4 chronic kidney disease, or unspecified chronic kidney disease: Secondary | ICD-10-CM | POA: Diagnosis not present

## 2023-07-15 DIAGNOSIS — R296 Repeated falls: Secondary | ICD-10-CM | POA: Diagnosis not present

## 2023-07-15 DIAGNOSIS — F330 Major depressive disorder, recurrent, mild: Secondary | ICD-10-CM | POA: Diagnosis not present

## 2023-07-15 DIAGNOSIS — G4700 Insomnia, unspecified: Secondary | ICD-10-CM | POA: Diagnosis not present

## 2023-07-16 NOTE — Progress Notes (Signed)
 BH MD/PA/NP OP Progress Note  07/17/2023 4:03 PM Nancy Suarez  MRN:  [id number]  Visit Diagnosis:    ICD-10-CM   1. Dementia with behavioral disturbance (HCC)  F03.918 QUEtiapine  (SEROQUEL ) 50 MG tablet    divalproex  (DEPAKOTE  SPRINKLE) 125 MG capsule    2. Recurrent major depressive disorder, remission status unspecified (HCC)  F33.9 mirtazapine  (REMERON ) 15 MG tablet    escitalopram  (LEXAPRO ) 10 MG tablet      Assessment: Nancy Suarez is a 89 y.o. female with a history of depression, dementia with behavioral disturbance, chronic IDA, HLD, CKD stage 3 who presented to [facility] for initial evaluation on 07/21/2022.  At initial evaluation patient reported symptoms of poor memory, visual hallucinations, behavioral disturbance, and depression.  During initial exam patient was found to only be oriented to person and month, while she was unsure of the day, year, and her location.  She was noted to be disorganized and had difficulty answering questions coherently at times.  Collateral obtained from the daughter confirmed that patient has had gradually worsening cognitive function and memory with a notable decline following her hospitalization in August 2023.  Patient has been experiencing episodes of sundowning and delirium in the afternoon/evenings.  Patient is also experiencing visual hallucinations that began after the memory decline.  There is a family history of Alzheimer's and the patient's brother.  Of note patient's MMSE on 06/19/22 was 15 and has been trending down over the past year.  Head CT from September 2023 showed recovering subdural hematoma, subacute anterior left frontal infarct, underlying atrophy with chronic small vessel ischemic change, and recovering periorbital hematoma.  At this time patient meets criteria for dementia with behavioral disturbance and MDD.  The specific nature of her dementia is unclear though there are signs consistent with  Lewy body dementia.  We would recommend continuing to coordinate with neurology to discuss neurocognitive testing and possible addition of cholinesterase inhibitor medications to help modulate dementia symptoms.  Nancy Suarez presents for follow-up evaluation. Today, 07/17/23, patient's daughter reports that patient had been doing well up until this past weekend.  On Friday there was a sudden shift in mood, cognition, and mental status.  Patient ended up staying awake for 2 days and did not take her medications during this time.  She has been better the past 24 hours.  With the rapid fluctuation in patient's mental status it raises the concern for delirium caused by infection.  The UTI is a likely possibility and we suggested urinalysis to test this.  Patient will bring a sample to the PCP.  If positive we would recommend starting on antibiotics.  We will continue on her current medication regimen and follow-up in 6 weeks.  Plan: - Continue Seroquel  50 mg QHS - Continue Depakote  125 mg QID - Continue Lexapro  15 mg QD - Continue Remeron  7.5 mg at bedtime - Continue Memantine  5 mg QHS managed by neurology - CMP, CBC, UA, Depakote  level reviewed - CT head w/o contrast from 03/24/22 reviewed - Started physical therapy - Crisis resources reviewed - Follow up in 6 weeks  Chief Complaint:  Chief Complaint  Patient presents with   Follow-up   HPI: Nancy Suarez presented alongside her daughter.  Of note most of the history was obtained from her daughter as Nancy Suarez was more confused on interview today.  She had a more difficult time remembering recent events or discussing how she currently is doing.  Of note patient had a  sudden shift in mood Friday evening after taking her medications.  Following that she was up from 2 AM on Saturday until 2 AM on Monday.  During that time she declined to take any of her medications.  Patient did fall asleep after that and has taken her medications and behaving better today.   That said her mental status is still cloudy.  Prior to this past weekend patient's daughter reports that Nancy Suarez had been doing very well.  She been taking her medication consistently and sleeping well throughout the night's.  There have been little to no issues with her and her interactions prior to this.  Nancy Suarez did mention that Nancy Suarez has a bit more irritability when she is woken up in the morning to take her meds but does so without issue.  Due to the rapid change in patient's mental status and fluctuation in routine we suspect that there might be an underlying infection.  UTIs are particularly common in older populations.  Patient's daughter does note that Nancy Suarez has been urinating more frequently and wears an adult diaper.  We suggested that patient have a urinalysis and assuming it is positive that she be started on an antibiotic.  Otherwise medication have been working well with no adverse side effects.  Patient's daughter did ask about Rexulti which we discussed however there was limited indication to transition off of Seroquel  and onto that at this time.  Past Psychiatric History: Patient has connected with a psychiatrist several decades ago for depressed mood.  She denies any prior psychiatric hospitalizations.  It is unclear whether she had any suicide attempts as her mother did report 1 however her daughter had been unaware of the accuracy.  Her daughter did note her mother tried to throw herself down the stairs sometime in the last year.  Medications patient has tried Depakote , Seroquel , Risperdal , Haldol , Lexapro , Remeron , Atarax , Ativan , memantine , and Ambien .  Of these patient reports Risperdal  was oversedating.  Remeron , Atarax , Ativan , and Ambien  were discontinued due to adverse effects in elderly and dementia patient.  Denies any substance use  Past Medical History:  Past Medical History:  Diagnosis Date   Allergic rhinitis    ASCUS (atypical squamous cells of undetermined  significance) on Pap smear    Atrophic vaginitis    Chronic insomnia    Depression    GERD (gastroesophageal reflux disease)    Goiter    History of kidney stones    Hypercholesteremia    Hypertension    LGSIL (low grade squamous intraepithelial dysplasia)    Osteopenia    Ovarian cyst    shrank it   Pancreatitis    Scoliosis    Spinal arthritis     Past Surgical History:  Procedure Laterality Date   ABDOMINAL HYSTERECTOMY  1977   partial   INTRAMEDULLARY (IM) NAIL INTERTROCHANTERIC Right 11/28/2021   Procedure: INTRAMEDULLARY (IM) NAIL INTERTROCHANTRIC;  Surgeon: Fidel Rogue, MD;  Location: [facility];  Service: Orthopedics;  Laterality: Right;   KIDNEY STONE SURGERY  1990's   2-3 stones x1 surgery   ORIF FEMUR FRACTURE Left 06/09/2017   Procedure: OPEN REDUCTION INTERNAL FIXATION (ORIF) DISTAL FEMUR FRACTURE;  Surgeon: Ernie Cough, MD;  Location: [facility];  Service: Orthopedics;  Laterality: Left;   TONSILLECTOMY  as teenager   TOTAL KNEE ARTHROPLASTY Left 01/19/2014   Procedure: LEFT TOTAL KNEE ARTHROPLASTY;  Surgeon: Cough JONETTA Ernie, MD;  Location: [facility];  Service: Orthopedics;  Laterality: Left;   WISDOM TOOTH EXTRACTION  Family History:  Family History  Problem Relation Age of Onset   Hypertension Father    Cancer Father    Cancer Mother        Bone   Cancer Brother     Social History:  Social History   Socioeconomic History   Marital status: Widowed    Spouse name: Not on file   Number of children: 1   Years of education: Not on file   Highest education level: Bachelor's degree (e.g., BA, AB, BS)  Occupational History   Not on file  Tobacco Use   Smoking status: Former    Current packs/day: 0.00    Types: Cigarettes    Start date: 07/03/1958    Quit date: 07/03/1978    Years since quitting: 45.0   Smokeless tobacco: Former  Building Services Engineer status: Never Used  Substance and Sexual Activity   Alcohol  use: Yes    Comment: occ   Drug use: No    Sexual activity: Yes    Birth control/protection: Surgical    Comment: hysterectomy  Other Topics Concern   Not on file  Social History Narrative   Lives at home with daughter, Nancy Suarez   Right handed   Caffeine: at the most 1 cup/day   Social Drivers of Corporate Investment Banker Strain: Not on file  Food Insecurity: Not on file  Transportation Needs: Not on file  Physical Activity: Not on file  Stress: Not on file  Social Connections: Not on file    Allergies:  Allergies  Allergen Reactions   Ambien  [Zolpidem ] Other (See Comments)    Disorientation Aggression    Aspirin  Other (See Comments)    Upsets stomach if 325mg    Codeine Nausea And Vomiting   Namenda  [Memantine ] Other (See Comments)    Agitation Aggression    Percocet [Oxycodone -Acetaminophen ] Other (See Comments)    Disorientation Aggression     Sulfa Antibiotics Nausea Only    Current Medications: Current Outpatient Medications  Medication Sig Dispense Refill   acetaminophen  (TYLENOL ) 500 MG tablet Take 2 tablets (1,000 mg total) by mouth 2 (two) times daily. (Patient taking differently: Take 1,000 mg by mouth See admin instructions. 1000 mg twice daily (morning and evening) + an additional 500 mg during the day as needed for pain.) 30 tablet 0   atorvastatin  (LIPITOR) 20 MG tablet Take 20 mg by mouth daily.     carboxymethylcellulose (REFRESH PLUS) 0.5 % SOLN Place 1 drop into both eyes in the morning, at noon, and at bedtime.     carvedilol  (COREG ) 3.125 MG tablet Take 1 tablet (3.125 mg total) by mouth 2 (two) times daily with a meal. 60 tablet 0   divalproex  (DEPAKOTE  SPRINKLE) 125 MG capsule Take 1 capsule (125 mg total) by mouth in the morning, at noon, in the evening, and at bedtime. 360 capsule 1   docusate sodium  (COLACE) 100 MG capsule Take 100 mg by mouth daily as needed for mild constipation.     escitalopram  (LEXAPRO ) 10 MG tablet Take 1.5 tablets (15 mg total) by mouth daily. 135 tablet 1    famotidine  (PEPCID ) 20 MG tablet Take 1 tablet (20 mg total) by mouth daily. 30 tablet 0   Fexofenadine  HCl (ALLEGRA  ALLERGY PO) Take 1 tablet by mouth daily.     fluticasone  (FLONASE ) 50 MCG/ACT nasal spray Place 1-2 sprays into both nostrils daily as needed for allergies.     MELATONIN PO Take 10 mg by mouth at  bedtime.     memantine  (NAMENDA ) 5 MG tablet Take 1 tablet (5 mg at night) for 2 weeks, then increase to 1 tablet (5 mg) twice a day (Patient taking differently: Take 1 tablet (5 mg ) daily) 60 tablet 11   mirtazapine  (REMERON ) 15 MG tablet Take 0.5 tablets (7.5 mg total) by mouth at bedtime. 45 tablet 1   Multiple Vitamins-Minerals (MULTIVITAMIN ADULT) CHEW Chew 1 tablet by mouth daily.     QUEtiapine  (SEROQUEL ) 50 MG tablet Take 1 tablet (50 mg total) by mouth at bedtime. 90 tablet 1   No current facility-administered medications for this visit.     Psychiatric Specialty Exam: Review of Systems  There were no vitals taken for this visit.There is no height or weight on file to calculate BMI.  General Appearance: Disheveled  Eye Contact:  Poor  Speech:  Normal Rate  Volume:  Normal  Mood:  Anxious and Depressed  Affect:  Tearful  Thought Process:  Disorganized  Orientation:  NA  Thought Content: Illogical and perseverative    Suicidal Thoughts:  No  Homicidal Thoughts:  No  Memory:  Immediate;   Poor  Judgement:  Impaired  Insight:  Lacking  Psychomotor Activity:  Restlessness  Concentration:  Concentration: Poor  Recall:  Poor  Fund of Knowledge: Poor  Language: Fair  Akathisia:  No    AIMS (if indicated): not done  Assets:  Architect Housing  ADL's:  Impaired  Cognition: Impaired,  Mild  Sleep:  Poor   Metabolic Disorder Labs: Lab Results  Component Value Date   HGBA1C 4.9 09/06/2020   No results found for: PROLACTIN No results found for: CHOL, TRIG, HDL, CHOLHDL, VLDL, LDLCALC Lab Results   Component Value Date   TSH 1.22 12/29/2022   TSH 3.261 03/12/2022    Therapeutic Level Labs: No results found for: LITHIUM Lab Results  Component Value Date   VALPROATE 46 (L) 03/26/2022   No results found for: CBMZ   Screenings: Mini-Mental    Flowsheet Row Office Visit from 12/29/2022 in [facility] Office Visit from 06/19/2022 in [facility] Office Visit from 02/14/2022 in [facility] Visit from 06/07/2021 in [facility] Office Visit from 03/01/2021 in [facility]  Total Score (max 30 points ) 15 15 17 21 21       [id number]    Flowsheet Row Office Visit from 12/10/2017 in [facility] at [facility] Total Score 0      Flowsheet Row ED to Hosp-Admission (Discharged) from 03/12/2022 in [facility] to Hosp-Admission (Discharged) from 02/28/2022 in [facility] from 02/02/2022 in [facility] at [facility]  C-SSRS RISK CATEGORY No Risk No Risk No Risk       Collaboration of Care: Collaboration of Care: Medication Management AEB medication prescription  Patient/Guardian was advised Release of Information must be obtained prior to any record release in order to collaborate their care with an outside provider. Patient/Guardian was advised if they have not already done so to contact the registration department to sign all necessary forms in order for us  to release information regarding their care.   Consent: Patient/Guardian gives verbal consent for treatment and assignment of benefits for services provided during this visit. Patient/Guardian expressed understanding and agreed to proceed.    Arvella CHRISTELLA Finder, MD 07/17/2023, 4:03 PM   Virtual  Visit via Video Note  I connected with Nancy Suarez on 07/17/23 at  3:30 PM EST by a video enabled telemedicine application  and verified that I am speaking with the correct person using two identifiers.  Location: Patient: Home Provider: Home Office   I discussed the limitations of evaluation and management by telemedicine and the availability of in person appointments. The patient expressed understanding and agreed to proceed.   I discussed the assessment and treatment plan with the patient. The patient was provided an opportunity to ask questions and all were answered. The patient agreed with the plan and demonstrated an understanding of the instructions.   The patient was advised to call back or seek an in-person evaluation if the symptoms worsen or if the condition fails to improve as anticipated.  I provided 20 minutes of non-face-to-face time during this encounter.   Arvella CHRISTELLA Finder, MD

## 2023-07-17 ENCOUNTER — Telehealth (HOSPITAL_BASED_OUTPATIENT_CLINIC_OR_DEPARTMENT_OTHER): Payer: Medicare Other | Admitting: Psychiatry

## 2023-07-17 ENCOUNTER — Encounter (HOSPITAL_COMMUNITY): Payer: Self-pay | Admitting: Psychiatry

## 2023-07-17 DIAGNOSIS — F339 Major depressive disorder, recurrent, unspecified: Secondary | ICD-10-CM | POA: Diagnosis not present

## 2023-07-17 DIAGNOSIS — F03918 Unspecified dementia, unspecified severity, with other behavioral disturbance: Secondary | ICD-10-CM

## 2023-07-17 MED ORDER — MIRTAZAPINE 15 MG PO TABS
7.5000 mg | ORAL_TABLET | Freq: Every day | ORAL | 1 refills | Status: AC
Start: 2023-07-17 — End: ?

## 2023-07-17 MED ORDER — DIVALPROEX SODIUM 125 MG PO CSDR: 125.0000 mg | DELAYED_RELEASE_CAPSULE | Freq: Four times a day (QID) | ORAL | 1 refills | Status: AC

## 2023-07-17 MED ORDER — ESCITALOPRAM OXALATE 10 MG PO TABS
15.0000 mg | ORAL_TABLET | Freq: Every day | ORAL | 1 refills | Status: AC
Start: 2023-07-17 — End: ?

## 2023-07-17 MED ORDER — QUETIAPINE FUMARATE 50 MG PO TABS
50.0000 mg | ORAL_TABLET | Freq: Every day | ORAL | 1 refills | Status: AC
Start: 2023-07-17 — End: ?

## 2023-07-18 ENCOUNTER — Other Ambulatory Visit: Payer: Self-pay | Admitting: Oncology

## 2023-07-18 DIAGNOSIS — R296 Repeated falls: Secondary | ICD-10-CM | POA: Diagnosis not present

## 2023-07-18 DIAGNOSIS — I1310 Hypertensive heart and chronic kidney disease without heart failure, with stage 1 through stage 4 chronic kidney disease, or unspecified chronic kidney disease: Secondary | ICD-10-CM | POA: Diagnosis not present

## 2023-07-18 DIAGNOSIS — F05 Delirium due to known physiological condition: Secondary | ICD-10-CM | POA: Diagnosis not present

## 2023-07-18 DIAGNOSIS — F03918 Unspecified dementia, unspecified severity, with other behavioral disturbance: Secondary | ICD-10-CM | POA: Diagnosis not present

## 2023-07-18 DIAGNOSIS — H409 Unspecified glaucoma: Secondary | ICD-10-CM | POA: Diagnosis not present

## 2023-07-18 DIAGNOSIS — F330 Major depressive disorder, recurrent, mild: Secondary | ICD-10-CM | POA: Diagnosis not present

## 2023-07-18 DIAGNOSIS — F418 Other specified anxiety disorders: Secondary | ICD-10-CM | POA: Diagnosis not present

## 2023-07-18 DIAGNOSIS — M1990 Unspecified osteoarthritis, unspecified site: Secondary | ICD-10-CM | POA: Diagnosis not present

## 2023-07-18 DIAGNOSIS — F0393 Unspecified dementia, unspecified severity, with mood disturbance: Secondary | ICD-10-CM | POA: Diagnosis not present

## 2023-07-18 DIAGNOSIS — N184 Chronic kidney disease, stage 4 (severe): Secondary | ICD-10-CM | POA: Diagnosis not present

## 2023-07-18 DIAGNOSIS — K219 Gastro-esophageal reflux disease without esophagitis: Secondary | ICD-10-CM | POA: Diagnosis not present

## 2023-07-18 DIAGNOSIS — D631 Anemia in chronic kidney disease: Secondary | ICD-10-CM | POA: Diagnosis not present

## 2023-07-18 DIAGNOSIS — G609 Hereditary and idiopathic neuropathy, unspecified: Secondary | ICD-10-CM | POA: Diagnosis not present

## 2023-07-18 DIAGNOSIS — F03911 Unspecified dementia, unspecified severity, with agitation: Secondary | ICD-10-CM | POA: Diagnosis not present

## 2023-07-18 DIAGNOSIS — F0394 Unspecified dementia, unspecified severity, with anxiety: Secondary | ICD-10-CM | POA: Diagnosis not present

## 2023-07-18 DIAGNOSIS — G4700 Insomnia, unspecified: Secondary | ICD-10-CM | POA: Diagnosis not present

## 2023-07-18 MED ORDER — CIPROFLOXACIN HCL 500 MG PO TABS: 500.0000 mg | ORAL_TABLET | Freq: Two times a day (BID) | ORAL | 0 refills | Status: AC

## 2023-07-26 DIAGNOSIS — Z01419 Encounter for gynecological examination (general) (routine) without abnormal findings: Secondary | ICD-10-CM | POA: Diagnosis not present

## 2023-07-26 DIAGNOSIS — K5900 Constipation, unspecified: Secondary | ICD-10-CM | POA: Diagnosis not present

## 2023-07-30 DIAGNOSIS — E7800 Pure hypercholesterolemia, unspecified: Secondary | ICD-10-CM | POA: Diagnosis not present

## 2023-07-30 DIAGNOSIS — G4700 Insomnia, unspecified: Secondary | ICD-10-CM | POA: Diagnosis not present

## 2023-07-30 DIAGNOSIS — K219 Gastro-esophageal reflux disease without esophagitis: Secondary | ICD-10-CM | POA: Diagnosis not present

## 2023-07-30 DIAGNOSIS — Q820 Hereditary lymphedema: Secondary | ICD-10-CM | POA: Diagnosis not present

## 2023-07-30 DIAGNOSIS — N184 Chronic kidney disease, stage 4 (severe): Secondary | ICD-10-CM | POA: Diagnosis not present

## 2023-07-30 DIAGNOSIS — I119 Hypertensive heart disease without heart failure: Secondary | ICD-10-CM | POA: Diagnosis not present

## 2023-07-30 DIAGNOSIS — M6281 Muscle weakness (generalized): Secondary | ICD-10-CM | POA: Diagnosis not present

## 2023-07-30 DIAGNOSIS — M1990 Unspecified osteoarthritis, unspecified site: Secondary | ICD-10-CM | POA: Diagnosis not present

## 2023-07-30 DIAGNOSIS — R809 Proteinuria, unspecified: Secondary | ICD-10-CM | POA: Diagnosis not present

## 2023-07-30 DIAGNOSIS — H409 Unspecified glaucoma: Secondary | ICD-10-CM | POA: Diagnosis not present

## 2023-07-30 DIAGNOSIS — M2550 Pain in unspecified joint: Secondary | ICD-10-CM | POA: Diagnosis not present

## 2023-07-30 DIAGNOSIS — F418 Other specified anxiety disorders: Secondary | ICD-10-CM | POA: Diagnosis not present

## 2023-07-30 DIAGNOSIS — F03911 Unspecified dementia, unspecified severity, with agitation: Secondary | ICD-10-CM | POA: Diagnosis not present

## 2023-07-30 DIAGNOSIS — F0394 Unspecified dementia, unspecified severity, with anxiety: Secondary | ICD-10-CM | POA: Diagnosis not present

## 2023-07-30 DIAGNOSIS — F330 Major depressive disorder, recurrent, mild: Secondary | ICD-10-CM | POA: Diagnosis not present

## 2023-07-30 DIAGNOSIS — R296 Repeated falls: Secondary | ICD-10-CM | POA: Diagnosis not present

## 2023-07-30 DIAGNOSIS — F0393 Unspecified dementia, unspecified severity, with mood disturbance: Secondary | ICD-10-CM | POA: Diagnosis not present

## 2023-07-30 DIAGNOSIS — F03918 Unspecified dementia, unspecified severity, with other behavioral disturbance: Secondary | ICD-10-CM | POA: Diagnosis not present

## 2023-07-30 DIAGNOSIS — F05 Delirium due to known physiological condition: Secondary | ICD-10-CM | POA: Diagnosis not present

## 2023-07-30 DIAGNOSIS — I1310 Hypertensive heart and chronic kidney disease without heart failure, with stage 1 through stage 4 chronic kidney disease, or unspecified chronic kidney disease: Secondary | ICD-10-CM | POA: Diagnosis not present

## 2023-07-30 DIAGNOSIS — M899 Disorder of bone, unspecified: Secondary | ICD-10-CM | POA: Diagnosis not present

## 2023-07-30 DIAGNOSIS — D631 Anemia in chronic kidney disease: Secondary | ICD-10-CM | POA: Diagnosis not present

## 2023-07-30 DIAGNOSIS — G609 Hereditary and idiopathic neuropathy, unspecified: Secondary | ICD-10-CM | POA: Diagnosis not present

## 2023-08-09 DIAGNOSIS — F330 Major depressive disorder, recurrent, mild: Secondary | ICD-10-CM | POA: Diagnosis not present

## 2023-08-09 DIAGNOSIS — G609 Hereditary and idiopathic neuropathy, unspecified: Secondary | ICD-10-CM | POA: Diagnosis not present

## 2023-08-09 DIAGNOSIS — R296 Repeated falls: Secondary | ICD-10-CM | POA: Diagnosis not present

## 2023-08-09 DIAGNOSIS — K219 Gastro-esophageal reflux disease without esophagitis: Secondary | ICD-10-CM | POA: Diagnosis not present

## 2023-08-09 DIAGNOSIS — F0393 Unspecified dementia, unspecified severity, with mood disturbance: Secondary | ICD-10-CM | POA: Diagnosis not present

## 2023-08-09 DIAGNOSIS — H409 Unspecified glaucoma: Secondary | ICD-10-CM | POA: Diagnosis not present

## 2023-08-09 DIAGNOSIS — D631 Anemia in chronic kidney disease: Secondary | ICD-10-CM | POA: Diagnosis not present

## 2023-08-09 DIAGNOSIS — F0394 Unspecified dementia, unspecified severity, with anxiety: Secondary | ICD-10-CM | POA: Diagnosis not present

## 2023-08-09 DIAGNOSIS — N184 Chronic kidney disease, stage 4 (severe): Secondary | ICD-10-CM | POA: Diagnosis not present

## 2023-08-09 DIAGNOSIS — F03918 Unspecified dementia, unspecified severity, with other behavioral disturbance: Secondary | ICD-10-CM | POA: Diagnosis not present

## 2023-08-09 DIAGNOSIS — I1310 Hypertensive heart and chronic kidney disease without heart failure, with stage 1 through stage 4 chronic kidney disease, or unspecified chronic kidney disease: Secondary | ICD-10-CM | POA: Diagnosis not present

## 2023-08-09 DIAGNOSIS — F03911 Unspecified dementia, unspecified severity, with agitation: Secondary | ICD-10-CM | POA: Diagnosis not present

## 2023-08-09 DIAGNOSIS — F05 Delirium due to known physiological condition: Secondary | ICD-10-CM | POA: Diagnosis not present

## 2023-08-09 DIAGNOSIS — M1990 Unspecified osteoarthritis, unspecified site: Secondary | ICD-10-CM | POA: Diagnosis not present

## 2023-08-09 DIAGNOSIS — G4700 Insomnia, unspecified: Secondary | ICD-10-CM | POA: Diagnosis not present

## 2023-08-09 DIAGNOSIS — F418 Other specified anxiety disorders: Secondary | ICD-10-CM | POA: Diagnosis not present

## 2023-08-14 DIAGNOSIS — F0393 Unspecified dementia, unspecified severity, with mood disturbance: Secondary | ICD-10-CM | POA: Diagnosis not present

## 2023-08-14 DIAGNOSIS — G4700 Insomnia, unspecified: Secondary | ICD-10-CM | POA: Diagnosis not present

## 2023-08-14 DIAGNOSIS — F418 Other specified anxiety disorders: Secondary | ICD-10-CM | POA: Diagnosis not present

## 2023-08-14 DIAGNOSIS — F03911 Unspecified dementia, unspecified severity, with agitation: Secondary | ICD-10-CM | POA: Diagnosis not present

## 2023-08-14 DIAGNOSIS — F05 Delirium due to known physiological condition: Secondary | ICD-10-CM | POA: Diagnosis not present

## 2023-08-14 DIAGNOSIS — H409 Unspecified glaucoma: Secondary | ICD-10-CM | POA: Diagnosis not present

## 2023-08-14 DIAGNOSIS — D631 Anemia in chronic kidney disease: Secondary | ICD-10-CM | POA: Diagnosis not present

## 2023-08-14 DIAGNOSIS — K219 Gastro-esophageal reflux disease without esophagitis: Secondary | ICD-10-CM | POA: Diagnosis not present

## 2023-08-14 DIAGNOSIS — F03918 Unspecified dementia, unspecified severity, with other behavioral disturbance: Secondary | ICD-10-CM | POA: Diagnosis not present

## 2023-08-14 DIAGNOSIS — R296 Repeated falls: Secondary | ICD-10-CM | POA: Diagnosis not present

## 2023-08-14 DIAGNOSIS — I1310 Hypertensive heart and chronic kidney disease without heart failure, with stage 1 through stage 4 chronic kidney disease, or unspecified chronic kidney disease: Secondary | ICD-10-CM | POA: Diagnosis not present

## 2023-08-14 DIAGNOSIS — N184 Chronic kidney disease, stage 4 (severe): Secondary | ICD-10-CM | POA: Diagnosis not present

## 2023-08-14 DIAGNOSIS — F330 Major depressive disorder, recurrent, mild: Secondary | ICD-10-CM | POA: Diagnosis not present

## 2023-08-14 DIAGNOSIS — M1990 Unspecified osteoarthritis, unspecified site: Secondary | ICD-10-CM | POA: Diagnosis not present

## 2023-08-14 DIAGNOSIS — G609 Hereditary and idiopathic neuropathy, unspecified: Secondary | ICD-10-CM | POA: Diagnosis not present

## 2023-08-14 DIAGNOSIS — F0394 Unspecified dementia, unspecified severity, with anxiety: Secondary | ICD-10-CM | POA: Diagnosis not present

## 2023-08-17 DIAGNOSIS — K219 Gastro-esophageal reflux disease without esophagitis: Secondary | ICD-10-CM | POA: Diagnosis not present

## 2023-08-17 DIAGNOSIS — H409 Unspecified glaucoma: Secondary | ICD-10-CM | POA: Diagnosis not present

## 2023-08-17 DIAGNOSIS — I1310 Hypertensive heart and chronic kidney disease without heart failure, with stage 1 through stage 4 chronic kidney disease, or unspecified chronic kidney disease: Secondary | ICD-10-CM | POA: Diagnosis not present

## 2023-08-17 DIAGNOSIS — N184 Chronic kidney disease, stage 4 (severe): Secondary | ICD-10-CM | POA: Diagnosis not present

## 2023-08-17 DIAGNOSIS — F330 Major depressive disorder, recurrent, mild: Secondary | ICD-10-CM | POA: Diagnosis not present

## 2023-08-17 DIAGNOSIS — F0394 Unspecified dementia, unspecified severity, with anxiety: Secondary | ICD-10-CM | POA: Diagnosis not present

## 2023-08-17 DIAGNOSIS — F418 Other specified anxiety disorders: Secondary | ICD-10-CM | POA: Diagnosis not present

## 2023-08-17 DIAGNOSIS — F03911 Unspecified dementia, unspecified severity, with agitation: Secondary | ICD-10-CM | POA: Diagnosis not present

## 2023-08-17 DIAGNOSIS — F0393 Unspecified dementia, unspecified severity, with mood disturbance: Secondary | ICD-10-CM | POA: Diagnosis not present

## 2023-08-17 DIAGNOSIS — M1990 Unspecified osteoarthritis, unspecified site: Secondary | ICD-10-CM | POA: Diagnosis not present

## 2023-08-17 DIAGNOSIS — R296 Repeated falls: Secondary | ICD-10-CM | POA: Diagnosis not present

## 2023-08-17 DIAGNOSIS — D631 Anemia in chronic kidney disease: Secondary | ICD-10-CM | POA: Diagnosis not present

## 2023-08-17 DIAGNOSIS — F05 Delirium due to known physiological condition: Secondary | ICD-10-CM | POA: Diagnosis not present

## 2023-08-17 DIAGNOSIS — G609 Hereditary and idiopathic neuropathy, unspecified: Secondary | ICD-10-CM | POA: Diagnosis not present

## 2023-08-17 DIAGNOSIS — G4700 Insomnia, unspecified: Secondary | ICD-10-CM | POA: Diagnosis not present

## 2023-08-17 DIAGNOSIS — F03918 Unspecified dementia, unspecified severity, with other behavioral disturbance: Secondary | ICD-10-CM | POA: Diagnosis not present

## 2023-08-21 ENCOUNTER — Telehealth (HOSPITAL_COMMUNITY): Payer: Self-pay | Admitting: *Deleted

## 2023-08-21 NOTE — Telephone Encounter (Signed)
 Writer spoke with pt's daughter, Wynona Canes, as Ms. Few called with concerns regarding her mother. Daughter says that pt is having "difficulty remembering how to swallow" when she takes her pills and when eating. This nurse advised Ms. Few to contact pt's neurologist and that message would be sent to Dr. Mercy Riding. Pt has an in-office visit with you on 08/28/23. Please review.

## 2023-08-22 DIAGNOSIS — M1990 Unspecified osteoarthritis, unspecified site: Secondary | ICD-10-CM | POA: Diagnosis not present

## 2023-08-22 DIAGNOSIS — K219 Gastro-esophageal reflux disease without esophagitis: Secondary | ICD-10-CM | POA: Diagnosis not present

## 2023-08-22 DIAGNOSIS — F330 Major depressive disorder, recurrent, mild: Secondary | ICD-10-CM | POA: Diagnosis not present

## 2023-08-22 DIAGNOSIS — F05 Delirium due to known physiological condition: Secondary | ICD-10-CM | POA: Diagnosis not present

## 2023-08-22 DIAGNOSIS — D631 Anemia in chronic kidney disease: Secondary | ICD-10-CM | POA: Diagnosis not present

## 2023-08-22 DIAGNOSIS — G4700 Insomnia, unspecified: Secondary | ICD-10-CM | POA: Diagnosis not present

## 2023-08-22 DIAGNOSIS — N184 Chronic kidney disease, stage 4 (severe): Secondary | ICD-10-CM | POA: Diagnosis not present

## 2023-08-22 DIAGNOSIS — F03911 Unspecified dementia, unspecified severity, with agitation: Secondary | ICD-10-CM | POA: Diagnosis not present

## 2023-08-22 DIAGNOSIS — F0393 Unspecified dementia, unspecified severity, with mood disturbance: Secondary | ICD-10-CM | POA: Diagnosis not present

## 2023-08-22 DIAGNOSIS — I1310 Hypertensive heart and chronic kidney disease without heart failure, with stage 1 through stage 4 chronic kidney disease, or unspecified chronic kidney disease: Secondary | ICD-10-CM | POA: Diagnosis not present

## 2023-08-22 DIAGNOSIS — F418 Other specified anxiety disorders: Secondary | ICD-10-CM | POA: Diagnosis not present

## 2023-08-22 DIAGNOSIS — R296 Repeated falls: Secondary | ICD-10-CM | POA: Diagnosis not present

## 2023-08-22 DIAGNOSIS — F03918 Unspecified dementia, unspecified severity, with other behavioral disturbance: Secondary | ICD-10-CM | POA: Diagnosis not present

## 2023-08-22 DIAGNOSIS — F0394 Unspecified dementia, unspecified severity, with anxiety: Secondary | ICD-10-CM | POA: Diagnosis not present

## 2023-08-22 DIAGNOSIS — H409 Unspecified glaucoma: Secondary | ICD-10-CM | POA: Diagnosis not present

## 2023-08-22 DIAGNOSIS — G609 Hereditary and idiopathic neuropathy, unspecified: Secondary | ICD-10-CM | POA: Diagnosis not present

## 2023-08-22 NOTE — Telephone Encounter (Addendum)
 Patients daughter was contacted via telephone and HIPAA compliant voicemail was left.

## 2023-08-26 ENCOUNTER — Other Ambulatory Visit (HOSPITAL_COMMUNITY): Payer: Self-pay | Admitting: Psychiatry

## 2023-08-26 DIAGNOSIS — F339 Major depressive disorder, recurrent, unspecified: Secondary | ICD-10-CM

## 2023-08-28 ENCOUNTER — Ambulatory Visit (HOSPITAL_COMMUNITY): Payer: Medicare Other | Admitting: Psychiatry

## 2023-08-29 DIAGNOSIS — F0394 Unspecified dementia, unspecified severity, with anxiety: Secondary | ICD-10-CM | POA: Diagnosis not present

## 2023-08-29 DIAGNOSIS — G609 Hereditary and idiopathic neuropathy, unspecified: Secondary | ICD-10-CM | POA: Diagnosis not present

## 2023-08-29 DIAGNOSIS — F330 Major depressive disorder, recurrent, mild: Secondary | ICD-10-CM | POA: Diagnosis not present

## 2023-08-29 DIAGNOSIS — H409 Unspecified glaucoma: Secondary | ICD-10-CM | POA: Diagnosis not present

## 2023-08-29 DIAGNOSIS — I1310 Hypertensive heart and chronic kidney disease without heart failure, with stage 1 through stage 4 chronic kidney disease, or unspecified chronic kidney disease: Secondary | ICD-10-CM | POA: Diagnosis not present

## 2023-08-29 DIAGNOSIS — G4700 Insomnia, unspecified: Secondary | ICD-10-CM | POA: Diagnosis not present

## 2023-08-29 DIAGNOSIS — F418 Other specified anxiety disorders: Secondary | ICD-10-CM | POA: Diagnosis not present

## 2023-08-29 DIAGNOSIS — D631 Anemia in chronic kidney disease: Secondary | ICD-10-CM | POA: Diagnosis not present

## 2023-08-29 DIAGNOSIS — F03918 Unspecified dementia, unspecified severity, with other behavioral disturbance: Secondary | ICD-10-CM | POA: Diagnosis not present

## 2023-08-29 DIAGNOSIS — F0393 Unspecified dementia, unspecified severity, with mood disturbance: Secondary | ICD-10-CM | POA: Diagnosis not present

## 2023-08-29 DIAGNOSIS — M1990 Unspecified osteoarthritis, unspecified site: Secondary | ICD-10-CM | POA: Diagnosis not present

## 2023-08-29 DIAGNOSIS — F05 Delirium due to known physiological condition: Secondary | ICD-10-CM | POA: Diagnosis not present

## 2023-08-29 DIAGNOSIS — N184 Chronic kidney disease, stage 4 (severe): Secondary | ICD-10-CM | POA: Diagnosis not present

## 2023-08-29 DIAGNOSIS — K219 Gastro-esophageal reflux disease without esophagitis: Secondary | ICD-10-CM | POA: Diagnosis not present

## 2023-08-29 DIAGNOSIS — F03911 Unspecified dementia, unspecified severity, with agitation: Secondary | ICD-10-CM | POA: Diagnosis not present

## 2023-08-29 DIAGNOSIS — R296 Repeated falls: Secondary | ICD-10-CM | POA: Diagnosis not present

## 2023-08-30 ENCOUNTER — Ambulatory Visit (HOSPITAL_COMMUNITY): Payer: Medicare Other | Admitting: Psychiatry

## 2023-09-04 DIAGNOSIS — K219 Gastro-esophageal reflux disease without esophagitis: Secondary | ICD-10-CM | POA: Diagnosis not present

## 2023-09-04 DIAGNOSIS — F418 Other specified anxiety disorders: Secondary | ICD-10-CM | POA: Diagnosis not present

## 2023-09-04 DIAGNOSIS — G4700 Insomnia, unspecified: Secondary | ICD-10-CM | POA: Diagnosis not present

## 2023-09-04 DIAGNOSIS — I1310 Hypertensive heart and chronic kidney disease without heart failure, with stage 1 through stage 4 chronic kidney disease, or unspecified chronic kidney disease: Secondary | ICD-10-CM | POA: Diagnosis not present

## 2023-09-04 DIAGNOSIS — R296 Repeated falls: Secondary | ICD-10-CM | POA: Diagnosis not present

## 2023-09-04 DIAGNOSIS — F03918 Unspecified dementia, unspecified severity, with other behavioral disturbance: Secondary | ICD-10-CM | POA: Diagnosis not present

## 2023-09-04 DIAGNOSIS — F03911 Unspecified dementia, unspecified severity, with agitation: Secondary | ICD-10-CM | POA: Diagnosis not present

## 2023-09-04 DIAGNOSIS — M1990 Unspecified osteoarthritis, unspecified site: Secondary | ICD-10-CM | POA: Diagnosis not present

## 2023-09-04 DIAGNOSIS — G609 Hereditary and idiopathic neuropathy, unspecified: Secondary | ICD-10-CM | POA: Diagnosis not present

## 2023-09-04 DIAGNOSIS — F0394 Unspecified dementia, unspecified severity, with anxiety: Secondary | ICD-10-CM | POA: Diagnosis not present

## 2023-09-04 DIAGNOSIS — H409 Unspecified glaucoma: Secondary | ICD-10-CM | POA: Diagnosis not present

## 2023-09-04 DIAGNOSIS — N184 Chronic kidney disease, stage 4 (severe): Secondary | ICD-10-CM | POA: Diagnosis not present

## 2023-09-04 DIAGNOSIS — F05 Delirium due to known physiological condition: Secondary | ICD-10-CM | POA: Diagnosis not present

## 2023-09-04 DIAGNOSIS — F330 Major depressive disorder, recurrent, mild: Secondary | ICD-10-CM | POA: Diagnosis not present

## 2023-09-04 DIAGNOSIS — D631 Anemia in chronic kidney disease: Secondary | ICD-10-CM | POA: Diagnosis not present

## 2023-09-04 DIAGNOSIS — F0393 Unspecified dementia, unspecified severity, with mood disturbance: Secondary | ICD-10-CM | POA: Diagnosis not present

## 2023-09-06 DIAGNOSIS — M6281 Muscle weakness (generalized): Secondary | ICD-10-CM | POA: Diagnosis not present

## 2023-09-06 DIAGNOSIS — F0390 Unspecified dementia without behavioral disturbance: Secondary | ICD-10-CM | POA: Diagnosis not present

## 2023-09-06 DIAGNOSIS — N184 Chronic kidney disease, stage 4 (severe): Secondary | ICD-10-CM | POA: Diagnosis not present

## 2023-09-06 DIAGNOSIS — I119 Hypertensive heart disease without heart failure: Secondary | ICD-10-CM | POA: Diagnosis not present

## 2023-09-17 NOTE — Progress Notes (Unsigned)
 BH MD/PA/NP OP Progress Note  09/17/2023 12:28 PM Nancy Suarez  MRN:  [id number]  Visit Diagnosis:  No diagnosis found.   Assessment: Nancy Suarez is a 89 y.o. female with a history of depression, dementia with behavioral disturbance, chronic IDA, HLD, CKD stage 3 who presented to [facility] at [facility] for initial evaluation on 07/21/2022.  At initial evaluation patient reported symptoms of poor memory, visual hallucinations, behavioral disturbance, and depression.  During initial exam patient was found to only be oriented to person and month, while she was unsure of the day, year, and her location.  She was noted to be disorganized and had difficulty answering questions coherently at times.  Collateral obtained from the daughter confirmed that patient has had gradually worsening cognitive function and memory with a notable decline following her hospitalization in August 2023.  Patient has been experiencing episodes of sundowning and delirium in the afternoon/evenings.  Patient is also experiencing visual hallucinations that began after the memory decline.  There is a family history of Alzheimer's and the patient's brother.  Of note patient's MMSE on 06/19/22 was 15 and has been trending down over the past year.  Head CT from September 2023 showed recovering subdural hematoma, subacute anterior left frontal infarct, underlying atrophy with chronic small vessel ischemic change, and recovering periorbital hematoma.  At this time patient meets criteria for dementia with behavioral disturbance and MDD.  The specific nature of her dementia is unclear though there are signs consistent with Lewy body dementia.  We would recommend continuing to coordinate with neurology to discuss neurocognitive testing and possible addition of cholinesterase inhibitor medications to help modulate dementia symptoms.  Nancy Suarez presents for follow-up evaluation. Today, 09/17/23, patient's  daughter reports that     patient had been doing well up until this past weekend.  On Friday there was a sudden shift in mood, cognition, and mental status.  Patient ended up staying awake for 2 days and did not take her medications during this time.  She has been better the past 24 hours.  With the rapid fluctuation in patient's mental status it raises the concern for delirium caused by infection.  The UTI is a likely possibility and we suggested urinalysis to test this.  Patient will bring a sample to the PCP.  If positive we would recommend starting on antibiotics.  We will continue on her current medication regimen and follow-up in 6 weeks.  Plan: - Continue Seroquel 50 mg QHS - Continue Depakote 125 mg QID - Continue Lexapro 15 mg QD - Continue Remeron 7.5 mg at bedtime - Continue Memantine 5 mg QHS managed by neurology - CMP, CBC, UA, Depakote level reviewed - CT head w/o contrast from 03/24/22 reviewed - Started physical therapy - Crisis resources reviewed - Follow up in 6 weeks  Chief Complaint:  No chief complaint on file.  HPI: Nancy Suarez presented alongside her daughter.  Of note most of the history was obtained from her daughter      as Nancy Suarez was more confused on interview today.  She had a more difficult time remembering recent events or discussing how she currently is doing.  Of note patient had a sudden shift in mood Friday evening after taking her medications.  Following that she was up from 2 AM on Saturday until 2 AM on Monday.  During that time she declined to take any of her medications.  Patient did fall asleep after that and has taken her medications  and behaving better today.  That said her mental status is still cloudy.  Prior to this past weekend patient's daughter reports that Nancy Suarez had been doing very well.  She been taking her medication consistently and sleeping well throughout the night's.  There have been little to no issues with her and her interactions  prior to this.  Nancy Suarez did mention that Nancy Suarez has a bit more irritability when she is woken up in the morning to take her meds but does so without issue.  Due to the rapid change in patient's mental status and fluctuation in routine we suspect that there might be an underlying infection.  UTIs are particularly common in older populations.  Patient's daughter does note that Nancy Suarez has been urinating more frequently and wears an adult diaper.  We suggested that patient have a urinalysis and assuming it is positive that she be started on an antibiotic.  Otherwise medication have been working well with no adverse side effects.  Patient's daughter did ask about Rexulti which we discussed however there was limited indication to transition off of Seroquel and onto that at this time.  Past Psychiatric History: Patient has connected with a psychiatrist several decades ago for depressed mood.  She denies any prior psychiatric hospitalizations.  It is unclear whether she had any suicide attempts as her mother did report 1 however her daughter had been unaware of the accuracy.  Her daughter did note her mother tried to throw herself down the stairs sometime in the last year.  Medications patient has tried Depakote, Seroquel, Risperdal, Haldol, Lexapro, Remeron, Atarax, Ativan, memantine, and Ambien.  Of these patient reports Risperdal was oversedating.  Remeron, Atarax, Ativan, and Ambien were discontinued due to adverse effects in elderly and dementia patient.  Denies any substance use  Past Medical History:  Past Medical History:  Diagnosis Date   Allergic rhinitis    ASCUS (atypical squamous cells of undetermined significance) on Pap smear    Atrophic vaginitis    Chronic insomnia    Depression    GERD (gastroesophageal reflux disease)    Goiter    History of kidney stones    Hypercholesteremia    Hypertension    LGSIL (low grade squamous intraepithelial dysplasia)    Osteopenia    Ovarian cyst     "shrank it"   Pancreatitis    Scoliosis    Spinal arthritis     Past Surgical History:  Procedure Laterality Date   ABDOMINAL HYSTERECTOMY  1977   partial   INTRAMEDULLARY (IM) NAIL INTERTROCHANTERIC Right 11/28/2021   Procedure: INTRAMEDULLARY (IM) NAIL INTERTROCHANTRIC;  Surgeon: Samson Frederic, MD;  Location: [facility];  Service: Orthopedics;  Laterality: Right;   KIDNEY STONE SURGERY  1990's   2-3 stones x1 surgery   ORIF FEMUR FRACTURE Left 06/09/2017   Procedure: OPEN REDUCTION INTERNAL FIXATION (ORIF) DISTAL FEMUR FRACTURE;  Surgeon: Durene Romans, MD;  Location: [facility];  Service: Orthopedics;  Laterality: Left;   TONSILLECTOMY  as teenager   TOTAL KNEE ARTHROPLASTY Left 01/19/2014   Procedure: LEFT TOTAL KNEE ARTHROPLASTY;  Surgeon: Shelda Pal, MD;  Location: [facility];  Service: Orthopedics;  Laterality: Left;   WISDOM TOOTH EXTRACTION      Family History:  Family History  Problem Relation Age of Onset   Hypertension Father    Cancer Father    Cancer Mother        Bone   Cancer Brother     Social History:  Social History  Socioeconomic History   Marital status: Widowed    Spouse name: Not on file   Number of children: 1   Years of education: Not on file   Highest education level: Bachelor's degree (e.g., BA, AB, BS)  Occupational History   Not on file  Tobacco Use   Smoking status: Former    Current packs/day: 0.00    Types: Cigarettes    Start date: 07/03/1958    Quit date: 07/03/1978    Years since quitting: 45.2   Smokeless tobacco: Former  Building services engineer status: Never Used  Substance and Sexual Activity   Alcohol use: Yes    Comment: occ   Drug use: No   Sexual activity: Yes    Birth control/protection: Surgical    Comment: hysterectomy  Other Topics Concern   Not on file  Social History Narrative   Lives at home with daughter, Nancy Suarez   Right handed   Caffeine: at the most 1 cup/day   Social Drivers of Corporate investment banker  Strain: Not on file  Food Insecurity: Not on file  Transportation Needs: Not on file  Physical Activity: Not on file  Stress: Not on file  Social Connections: Not on file    Allergies:  Allergies  Allergen Reactions   Ambien [Zolpidem] Other (See Comments)    Disorientation Aggression    Aspirin Other (See Comments)    Upsets stomach if 325mg    Codeine Nausea And Vomiting   Namenda [Memantine] Other (See Comments)    Agitation Aggression    Percocet [Oxycodone-Acetaminophen] Other (See Comments)    Disorientation Aggression     Sulfa Antibiotics Nausea Only    Current Medications: Current Outpatient Medications  Medication Sig Dispense Refill   acetaminophen (TYLENOL) 500 MG tablet Take 2 tablets (1,000 mg total) by mouth 2 (two) times daily. (Patient taking differently: Take 1,000 mg by mouth See admin instructions. 1000 mg twice daily (morning and evening) + an additional 500 mg during the day as needed for pain.) 30 tablet 0   atorvastatin (LIPITOR) 20 MG tablet Take 20 mg by mouth daily.     carboxymethylcellulose (REFRESH PLUS) 0.5 % SOLN Place 1 drop into both eyes in the morning, at noon, and at bedtime.     carvedilol (COREG) 3.125 MG tablet Take 1 tablet (3.125 mg total) by mouth 2 (two) times daily with a meal. 60 tablet 0   ciprofloxacin (CIPRO) 500 MG tablet Take 1 tablet (500 mg total) by mouth 2 (two) times daily. 10 tablet 0   divalproex (DEPAKOTE SPRINKLE) 125 MG capsule Take 1 capsule (125 mg total) by mouth in the morning, at noon, in the evening, and at bedtime. 360 capsule 1   docusate sodium (COLACE) 100 MG capsule Take 100 mg by mouth daily as needed for mild constipation.     escitalopram (LEXAPRO) 10 MG tablet Take 1.5 tablets (15 mg total) by mouth daily. 135 tablet 1   famotidine (PEPCID) 20 MG tablet Take 1 tablet (20 mg total) by mouth daily. 30 tablet 0   Fexofenadine HCl (ALLEGRA ALLERGY PO) Take 1 tablet by mouth daily.     fluticasone (FLONASE)  50 MCG/ACT nasal spray Place 1-2 sprays into both nostrils daily as needed for allergies.     MELATONIN PO Take 10 mg by mouth at bedtime.     memantine (NAMENDA) 5 MG tablet Take 1 tablet (5 mg at night) for 2 weeks, then increase to 1 tablet (5  mg) twice a day (Patient taking differently: Take 1 tablet (5 mg ) daily) 60 tablet 11   mirtazapine (REMERON) 15 MG tablet Take 0.5 tablets (7.5 mg total) by mouth at bedtime. 45 tablet 1   Multiple Vitamins-Minerals (MULTIVITAMIN ADULT) CHEW Chew 1 tablet by mouth daily.     QUEtiapine (SEROQUEL) 50 MG tablet Take 1 tablet (50 mg total) by mouth at bedtime. 90 tablet 1   No current facility-administered medications for this visit.     Psychiatric Specialty Exam: Review of Systems  There were no vitals taken for this visit.There is no height or weight on file to calculate BMI.  General Appearance: Disheveled  Eye Contact:  Poor  Speech:  Normal Rate  Volume:  Normal  Mood:  Anxious and Depressed  Affect:  Tearful  Thought Process:  Disorganized  Orientation:  NA  Thought Content: Illogical and perseverative    Suicidal Thoughts:  No  Homicidal Thoughts:  No  Memory:  Immediate;   Poor  Judgement:  Impaired  Insight:  Lacking  Psychomotor Activity:  Restlessness  Concentration:  Concentration: Poor  Recall:  Poor  Fund of Knowledge: Poor  Language: Fair  Akathisia:  No    AIMS (if indicated): not done  Assets:  Architect Housing  ADL's:  Impaired  Cognition: Impaired,  Mild  Sleep:  Poor   Metabolic Disorder Labs: Lab Results  Component Value Date   HGBA1C 4.9 09/06/2020   No results found for: "PROLACTIN" No results found for: "CHOL", "TRIG", "HDL", "CHOLHDL", "VLDL", "LDLCALC" Lab Results  Component Value Date   TSH 1.22 12/29/2022   TSH 3.261 03/12/2022    Therapeutic Level Labs: No results found for: "LITHIUM" Lab Results  Component Value Date   VALPROATE 46 (L)  03/26/2022   No results found for: "CBMZ"   Screenings: Mini-Mental    Flowsheet Row Office Visit from 12/29/2022 in [facility] Office Visit from 06/19/2022 in [facility] Office Visit from 02[facility]14/2022 in [facility] Office Visit from 06/07/2021 in [facility] Office Visit from 03/01/2021 in [facility]  Total Score (max 30 points ) 15 15 17 21 21       [id number]    Flowsheet Row Office Visit from 12/10/2017 in [facility] at [facility] Total Score 0      Flowsheet Row ED to Hosp-Admission (Discharged) from 03/12/2022 in [facility] to Hosp-Admission (Discharged) from 02/28/2022 in [facility] from 02/02/2022 in [facility] Emergency Department at [facility]  C-SSRS RISK CATEGORY No Risk No Risk No Risk       Collaboration of Care: Collaboration of Care: Medication Management AEB medication prescription  Patient/Guardian was advised Release of Information must be obtained prior to any record release in order to collaborate their care with an outside provider. Patient/Guardian was advised if they have not already done so to contact the registration department to sign all necessary forms in order for [place] to release information regarding their care.   Consent: Patient/Guardian gives verbal consent for treatment and assignment of benefits for services provided during this visit. Patient/Guardian expressed understanding and agreed to proceed.    Stasia Cavalier, MD 09/17/2023, 12:28 PM   Virtual Visit via Video Note  I connected with Nancy Suarez on 09/17/23 at 11:30 AM EDT by a video enabled telemedicine application and verified that I  am speaking with the correct person using two identifiers.  Location: Patient: Home Provider: Home Office   I discussed the limitations of evaluation and  management by telemedicine and the availability of in person appointments. The patient expressed understanding and agreed to proceed.   I discussed the assessment and treatment plan with the patient. The patient was provided an opportunity to ask questions and all were answered. The patient agreed with the plan and demonstrated an understanding of the instructions.   The patient was advised to call back or seek an in-person evaluation if the symptoms worsen or if the condition fails to improve as anticipated.  I provided 20 minutes of non-face-to-face time during this encounter.   Stasia Cavalier, MD

## 2023-09-20 ENCOUNTER — Encounter (HOSPITAL_COMMUNITY): Payer: Medicare Other | Admitting: Psychiatry

## 2023-09-20 ENCOUNTER — Encounter (HOSPITAL_COMMUNITY): Payer: Self-pay

## 2023-09-20 NOTE — Progress Notes (Signed)
 This encounter was created in error - please disregard.  Patient did not show for her appointment and her daughter was contacted via telephone.  Spoke with her daughter Nancy Suarez who reported that she had canceled the appointment yesterday.  Nancy Suarez explained that [name] is not doing well the past couple weeks.  He had started a little after the time she had messaged out the swallowing concerns.  At that time Nancy Suarez started crushing up the medications which helped alleviate the swallowing concerns.  Shortly after this however Nancy Suarez had rather swift declined and has not eaten or drank much of anything over the past couple weeks.  She has also been getting more agitated.  Nancy Suarez has contacted hospice who are now involved at the home.  So far they have not made any medication changes.  We discussed the plan going forward and Nancy Suarez will clarify with the hospital doctors on whether they are going to take over her psychiatric care at this time.  If further management is needed Nancy Suarez will reach out to this provider to let them know.

## 2023-10-10 ENCOUNTER — Ambulatory Visit: Payer: Medicare Other | Admitting: Physician Assistant

## 2023-11-21 ENCOUNTER — Telehealth (HOSPITAL_COMMUNITY): Payer: Self-pay | Admitting: Psychiatry

## 2023-12-02 DEATH — deceased
